# Patient Record
Sex: Male | Born: 1960 | Hispanic: Yes | Marital: Married
Health system: Northeastern US, Academic
[De-identification: ages and names within clinical notes are randomized; demographics above are authoritative.]

---

## 2019-04-16 ENCOUNTER — Encounter: Admit: 2019-04-16 | Payer: PRIVATE HEALTH INSURANCE | Attending: Nephrology | Primary: Family Medicine

## 2019-04-16 DIAGNOSIS — N186 End stage renal disease: Secondary | ICD-10-CM

## 2019-04-24 ENCOUNTER — Ambulatory Visit: Admit: 2019-04-24 | Payer: PRIVATE HEALTH INSURANCE | Attending: Cardiovascular Disease | Primary: Family Medicine

## 2019-04-24 ENCOUNTER — Encounter: Admit: 2019-04-24 | Payer: PRIVATE HEALTH INSURANCE | Attending: Cardiovascular Disease | Primary: Family Medicine

## 2019-04-24 DIAGNOSIS — E213 Hyperparathyroidism, unspecified: Secondary | ICD-10-CM

## 2019-04-24 DIAGNOSIS — D649 Anemia, unspecified: Secondary | ICD-10-CM

## 2019-04-24 DIAGNOSIS — J101 Influenza due to other identified influenza virus with other respiratory manifestations: Secondary | ICD-10-CM

## 2019-04-24 DIAGNOSIS — I10 Essential (primary) hypertension: Secondary | ICD-10-CM

## 2019-04-24 DIAGNOSIS — I712 Thoracic aortic aneurysm (HC Code): Secondary | ICD-10-CM

## 2019-04-24 DIAGNOSIS — N2581 Secondary hyperparathyroidism of renal origin: Secondary | ICD-10-CM

## 2019-04-24 DIAGNOSIS — K7460 Unspecified cirrhosis of liver: Secondary | ICD-10-CM

## 2019-04-24 DIAGNOSIS — G4733 Obstructive sleep apnea (adult) (pediatric): Secondary | ICD-10-CM

## 2019-04-24 DIAGNOSIS — E7800 Pure hypercholesterolemia, unspecified: Secondary | ICD-10-CM

## 2019-04-24 DIAGNOSIS — K219 Gastro-esophageal reflux disease without esophagitis: Secondary | ICD-10-CM

## 2019-04-24 DIAGNOSIS — N186 End stage renal disease: Secondary | ICD-10-CM

## 2019-04-24 DIAGNOSIS — T827XXA Infection and inflammatory reaction due to other cardiac and vascular devices, implants and grafts, initial encounter: Secondary | ICD-10-CM

## 2019-04-24 DIAGNOSIS — I770 Arteriovenous fistula, acquired: Secondary | ICD-10-CM

## 2019-04-24 NOTE — Progress Notes
Patient ID: [id number] Patient Name: Adam Harper of Birth: 10/15/1962Date of Service: 2/9/2021HISTORY OF PRESENT ILLNESS:  Patient is a 59 y.o. 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 A perfusion imaging stress test was obtained on January 02, 2015. This revealed a small mild basal to mid inferolateral fixed defect without evidence of ischemia. There was moderate LAD, circumflex and right coronary artery calcification. Aortic dimensions measured 4.1 cm at the proximal aortic root.Echocardiogram obtained on August 24, 2016 revealed an ejection fraction greater than 70% without evidence of significant LVOT obstruction, mild mitral and tricuspid insufficiency, aortic sclerosis, maximal aortic dimensions of 4.1 centimeters,, mild left atrial enlargement, estimated RV systolic pressure of 25 millimeters of mercury.  Moderate concentric LVH.[place] Chest completed on 05/25/2018 showed unchanged dilated ascending aorta to 4.1 cm. Severe three-vessel coronary arterial calcification. Indeterminant left renal nodular densities may represent hemorrhagic/proteinaceous cyst given. Recommend correlation with today's ultrasound. Further characterization can be performed with [place] or MRI renal mass protocol if warranted. 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 Dedicated thymic MRI can be performed for further characterization as clinically indicated.US Abdominal Aorta completed on 05/25/2018 showed the proximal abdominal aorta measures 2.6 x 2.4 cm in size. The mid abdominal aorta measures 2.2 x 1.9 cm in size. No evidence for abdominal aortic aneurysm.Echocardiogram obtained November 10, 2018. Mildly increased left ventricular cavity size. Normal left ventricular systolic function. Severe concentric left ventricular hypertrophy. LVEF calculated by 3DE was 68%. Normal right ventricular cavity size and systolic function. Right ventricular systolic pressure is unable to be estimated due to insufficient Doppler signal. Left atrium is moderately dilated. Mild aortic valve thickening. No aortic stenosis. Trace aortic regurgitation. Dilated sinuses of Valsalva with a diameter of 4.4 cm and dilated ascending aorta with a diameter of 4.2 cm. No evidence of pericardial effusion. Compared to the study from 08/24/16, the left ventricular hypertrophy is now severe.Adam Harper presents today in cardiac followup prior to listing for renal transplantation. A spanish translator was used throughout the visit. He has been feeling well since our last visit. He denies any new problems and denies pain in his arms, chest, and jaw. Reports that he fell on his left hip yesterday while shoveling snow, but he is not currently experiencing pain. Patient reports a new fistula, but no issues with dialysis. Patient uses CPAP for his sleep apnea.  He denies lightheadedness, dizziness, near-syncope or syncope.  He denies shortness of breath, orthopnea, PND, lower extremity edema.  No melena or bleeding.PAST MEDICAL HISTORY:Past Medical History: Diagnosis Date ? A-V fistula (HC Code)   right arm ? Anemia  ? AV fistula infection (HC Code)  ? Cirrhosis (HC Code)  ? 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 GERD (gastroesophageal reflux disease)  ? Hypercholesteremia  ? Hyperparathyroidism (HC Code)  ? Hypertension  ? Influenza A  ? Morbid obesity (HC Code) 02/03/2018 ? Obstructive sleep apnea   pt uses CPAP ? Secondary hyperparathyroidism of renal origin (HC Code)  ? Thoracic aortic aneurysm (HC Code)  CURRENT MEDICATION LIST:Current Outpatient Medications Medication Sig Dispense Refill ? amLODIPine (NORVASC) 10 mg tablet Take 1 tablet (10 mg total) by mouth every morning. 90 tablet 3 ? atorvastatin (LIPITOR) 80 mg tablet Take 1 tablet (80 mg total) by mouth daily. 90 tablet 3 ? carvediloL (COREG) 25 mg Immediate Release tablet TAKE 1 TABLET(25 MG) BY MOUTH TWICE DAILY WITH  BREAKFAST AND DINNER 90 tablet 2 ? cinacalcet (SENSIPAR) 30 MG tablet Take 2 tablets (60 mg total) by mouth daily (Patient taking differently: Take 90 mg by mouth daily. ) 30 tablet 11 ? DARBepoetin alfa-polysorbate (ARANESP) 60 mcg/0.3 mL syringe Inject 0.3 mLs (60 mcg total) under the skin every 7 days. In dialysis 4 Syringe 2 ? diclofenac (VOLTAREN) 1 % gel Apply topically 4 (four) times daily. 100 g 2 ? epoetin alfa (EPOGEN INJ) Inject 3,200 Units under the skin Every Monday, Wednesday, and Friday.   ? iron sucrose complex (VENOFER IV) Inject 50 mg into the vein Every Monday.   ? lisinopriL (PRINIVIL,ZESTRIL) 40 mg tablet Take 1 tablet (40 mg total) by mouth daily. 90 tablet 3 ? LORazepam (ATIVAN) 1 mg tablet Take 1 tablet (1 mg total) by mouth once as needed for anxiety (take 30 minutes prior to MRI for anxiety) for up to 1 dose. 1 tablet 0 ? omeprazole (PRILOSEC) 20 mg capsule Take 1 capsule (20 mg total) by mouth daily. 90 capsule 1 ? oxyCODONE (ROXICODONE) 5 mg Immediate Release tablet Take 1 tablet (5 mg total) by mouth every 4 (four) hours as needed. 8 tablet 0 ? paricalcitol (ZEMPLAR IV) Inject 7 mcg into the vein Every Monday, Wednesday, and Friday.   ? sevelamer (RENVELA) 800 mg tablet TAKE 3 TABLETS BY MOUTH THREE TIMES DAILY AND 2 WITH SNACKS ONCE DAILY 990 tablet 0 ? triamcinolone (KENALOG) 0.1 % cream Apply topically 2 (two) times daily. 30 g 1 No current facility-administered medications for this visit.  CURRENT ALLERGY LIST:No Known Allergies FAMILY HISTORY:No family history on file. SOCIAL HISTORY:Social History Socioeconomic History ? Marital status: Married   Spouse name: Not on file ? Number of children: Not on file ? Years of education: Not on file ? Highest education level: Not on file Tobacco Use ? Smoking status: Former Smoker   Packs/day: 0.25   Years: 25.00   Pack years: 6.25   Types: Cigarettes ? Smokeless tobacco: Never Used Substance and Sexual Activity ? Alcohol use: No ? Drug use: No ? Sexual activity: Yes   Partners: Female REVIEW OF SYSTEMS:Remaining 12-point review of systems unremarkable with the exception of history of present illness. This was repeated by me today, April 24, 2019.  VITALS:BP (P) 138/80 (Site: r a, Position: Sitting, Cuff Size: Large)  - Pulse (P) 73  - Ht 5' 9 (1.753 m)  - Wt (P) 117 kg  - SpO2 (P) 96%  - BMI (P) 38.10 kg/m? By M.D. Blood pressure is 134/64 in the left arm. PHYSICAL EXAM: 	CONSTITUTIONAL:		GENERAL APPEARANCE:  Patient is male.  Alert, well developed, well nourished.  In no acute distress.	HEAD/FACE: The head is normocephalic.	NECK AND THYROID: No bruits noted in the neck.  Difficult to assess JVD in the context of very thick neck.  No thyroid enlargement.	RESPIRATORY:  No wheezes, rales, or rhonchi. Clear to auscultation.	CARDIOVASCULAR: 		PALPATION & AUSCULTATION: S1 S2.  Regular rate with An audible S4 as well as a holosystolic murmur at the cardiac apex.		ARTERIAL PULSES: Normal carotid pulses with no bruits.  No palpable enlargement or bruit of the abdominal aorta.  No renal bruit.  Normal femoral pulses without bruits or enlargements.  Normal radial pulses.  Normal pedal pulses and capillary refill.		EDEMA/VARISCOSITIES OF EXTREMITIES: No significant edema.	GASTROINTESTINAL: 		ABDOMEN: Soft, non-tender, non distend. Normal bowel signs. No masses appreciated.		LIVER/KIDNEY/SPLEEN: No hepatosplenomegaly.	PSYCHIATRIC: Awake and alert.  Oriented to person, place, time and general circumstances.  Mood and affect appropriate.TEST RESULTS:  Chemistry    Component  Value Date/Time  NA 136 02/21/2019 0940  K 5.5 (H) 02/21/2019 0940  CL 94 (L) 02/21/2019 0940  CO2 23 02/21/2019 0940 BUN 63 (H) 02/21/2019 0940  CREATININE 17.05 (H) 02/21/2019 0940  GLU 123 (H) 02/21/2019 0940    Component Value Date/Time  CALCIUM 8.4 (L) 02/21/2019 0940  ALKPHOS 72 10/03/2018 1502  AST 20 10/03/2018 1502  AST 15 01/23/2013 1534  ALT 11 10/03/2018 1502  ALT 16 01/23/2013 1534  BILITOT 0.4 10/03/2018 1502  Lab Results Component Value Date  WBC 6.0 02/21/2019  WBC 4.7 10/03/2018  WBC 8.7 09/29/2018  HGB 10.6 (L) 02/21/2019  HGB 10.8 (L) 10/03/2018  HGB 12.5 09/29/2018  HCT 32.9 (L) 02/21/2019  HCT 39.0 (L) 02/15/2019  HCT 33.1 (L) 10/03/2018  MCV 105.4 (H) 02/21/2019  MCV 104.1 (H) 10/03/2018  MCV 103.5 (H) 09/29/2018  PLT 195 02/21/2019  PLT 235 10/03/2018  PLT 282 09/29/2018  HGBA1C 5.5 10/03/2018  HGBA1C 5.5 03/03/2018  HGBA1C 5.5 10/13/2015  CHOL 104 10/03/2018  CHOL 145 09/29/2018  CHOL 102 03/21/2018  CHOLHDL 4.3 10/03/2018  CHOLHDL 4.4 09/29/2018  CHOLHDL 3.6 03/21/2018  HDL 24 (L) 10/03/2018  HDL 33 (L) 09/29/2018  HDL 28 (L) 03/21/2018  LDL 43 10/03/2018  LDL 76 16/12/9602  LDL 45 54/11/8117  TRIG 185 (H) 10/03/2018  TRIG 182 (H) 09/29/2018  TRIG 145 03/21/2018  ALT 11 10/03/2018  ALT 19 09/29/2018  ALT 24 03/03/2018  AST 20 10/03/2018  AST 25 09/29/2018  AST 20 03/03/2018 Lab Results Component Value Date  CREATININE 17.05 (H) 02/21/2019 Lab Results Component Value Date  ALT 11 10/03/2018  AST 20 10/03/2018  ALKPHOS 72 10/03/2018  BILITOT 0.4 10/03/2018 Perfusion imaging stress test from January 02, 2015 as described above.Echocardiogram obtained on August 24, 2016 described above.Left heart catheterization on April 04, 2018 mild coronary disease in the LAD, left main artery, and LCX. No significant obstructive coronary disease noted. LVEDP 40 mmHg.[place] Chest completed on 05/25/2018 described above.US Abdominal Aorta completed on 05/25/2018 described above.Lipid profile obtained on 7/21/20202 revealed LDL of 43, HDL 24, triglycerides 185 and cholesterol 104. EKG obtained November 07, 2018 revealed sinus rhythm at a rate of 76, incomplete left bundle branch block, likely left ventricular hypertrophy with strain pattern.Echocardiogram obtained November 10, 2018 as described above.Blood work obtained on February 21, 2019 revealed sodium 136, potassium 5.5, creatinine 17.05, BUN 63, glucose 123, hematocrit 32.9, platelets 195. EKG obtained in the office today reveals sinus rhythm at a rate of 73 and incomplete left bundle branch block.  Likely LVH/strain pattern with T-wave inversions in V4 through V6.  T-wave inversions in 1 and L. ASSESSMENT/PLAN:Pretransplant evaluation: Cardiac risk factors include severe hypertension, dyslipidemia, prior tobacco use, treated obstructive sleep apnea. His EKG is abnormal as outlined above. Perfusion imaging stress test with no evidence of ischemia in 2016. There is evidence of underlying coronary artery disease with moderate triple-vessel coronary calcification. Repeat coronary angiogram on April 04, 2018 revealed no significant obstructive coronary disease.  Left ventricular end-diastolic pressure was significantly elevated.  I suspect this is secondary to high blood pressure, sleep apnea, and kidney disease with ongoing hemodialysis. Current dry weight at dialysis has been stable per patient.  I believe his dry weight should be reduced if at all possible.  He has been compliant with his CPAP.   I have continued high intensity statin therapy as well as high intensity beta-blockade and lisinopril.  At this juncture I believe he can remain listed for transplant.Hypertension: His blood  pressure is acceptable today in office. He will continue to monitor BP. He denies any lightheadedness, dizziness, or headaches. His current antihypertensive regimen includes carvedilol 25 mg b.i.d., amlodipine 10 mg daily, lisinopril 40 mg daily.  I will defer further increases in his antihypertensive regimen to the nephrology team.  Mildly dilated aortic root: Dilated sinuses of Valsalva with a diameter of 4.1 cm and dilated ascending aorta with a diameter of 4.0 cm on echocardiogram from June of 2018. Most recent [place] Chest completed on 05/25/2018 showed unchanged dilated ascending aorta to 4.1 cm. Severe three-vessel coronary arterial calcification. Indeterminant left renal nodular densities may represent hemorrhagic/proteinaceous cyst given. Recommend correlation with today's ultrasound. Further characterization can be performed with [place] or MRI renal mass protocol if warranted. 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 Dedicated thymic MRI can be performed for further characterization as clinically indicated. US Abdominal Aorta completed on 05/25/2018 showed the proximal abdominal aorta measures 2.6 x 2.4 cm in size. The mid abdominal aorta measures 2.2 x 1.9 cm in size. No evidence for abdominal aortic aneurysm. I have recommended ongoing blood pressure control.  I will discuss further increases in his antihypertensive regimen with his nephrology team.  I will defer further follow-up of his mediastinal nodule to his primary care team.  Most recent echocardiography from November 10, 2018 revealed maximal aortic dimensions of 4.4 centimeters at the sinuses of Valsalva, 4.2 centimeters at the mid ascending aorta.  This represents an increase from his previous study in 2018. At that time the sinuses of Valsalva measured 4.1 centimeters in the ascending aorta measured 4 centimeters.  I will obtain a follow-up echocardiogram before his next follow-up in 6 months.  I would continue to advocate aggressive blood pressure control.Dyslipidemia: Most recent lipid profile is at goal. He is tolerating Lipitor 80 mg daily.I will plan to see him in follow up in 6 months. He will contact me with any changes or questions. Scribed for Dr. Dell Ponto by Phillip Heal, medical scribe. February 9, 2021Electronically Signed by Phillip Heal, February 9, 2021The documentation recorded by the scribe accurately reflects the services I personally performed and the decisions made by me. I reviewed and confirmed all material pre-charted by the scribe at the time of the encounter.Scribed for Dr. Augusto Garbe by Kris Hartmann, medical scribe. February 9, 2021Electronically Signed by Kris Hartmann, February 9, 2021The documentation recorded by the scribe accurately reflects the service I personally performed and the decisions made by me.

## 2019-04-25 ENCOUNTER — Encounter: Admit: 2019-04-25 | Payer: PRIVATE HEALTH INSURANCE | Attending: Nephrology | Primary: Family Medicine

## 2019-04-25 DIAGNOSIS — N186 End stage renal disease: Secondary | ICD-10-CM

## 2019-04-30 ENCOUNTER — Encounter: Admit: 2019-04-30 | Payer: PRIVATE HEALTH INSURANCE | Primary: Family Medicine

## 2019-05-03 ENCOUNTER — Inpatient Hospital Stay: Admit: 2019-05-03 | Discharge: 2019-05-03 | Payer: PRIVATE HEALTH INSURANCE | Primary: Family Medicine

## 2019-05-03 NOTE — Discharge Instructions
[facility] REMOVAL DISCHARGE INSTRUCTIONSSite Care:1.  Remove dressing after 72 hours (3 days).2.  Keep wound clean and dry.3.  May shower after 72 hours (3 days).4.  No swimming, hot tubs or tub bath for 10-14 days.Activity:For the next 24 hours do not perform strenuous activity.Diet:Resume diet prior to procedure as tolerated.Medications:Resume all previous medications without change.When to Call Unusual pain, swelling or redness at siteBleedingShaking chillsChest painShortness of breathFever over 101*FDrainage around the site   Contact us:For any questions or concerns any time please call [phone].SHOULD THERE BE AN EMERGENCY-CALL 911 FOR IMMEDIATE PARAMEDIC RESPONSE OR GO DIRECTLY TO THE NEAREST EMERGENCY ROOM

## 2019-05-07 ENCOUNTER — Telehealth: Admit: 2019-05-07 | Payer: PRIVATE HEALTH INSURANCE | Attending: Dietician | Primary: Family Medicine

## 2019-05-08 ENCOUNTER — Ambulatory Visit: Admit: 2019-05-08 | Payer: PRIVATE HEALTH INSURANCE | Attending: Cardiovascular Disease | Primary: Family Medicine

## 2019-05-08 NOTE — Progress Notes
Nutrition Assessment TELEPHONE VISIT: For this visit the clinician and patient were present via telephone (audio only).Patient counseled on available options for visit type; Patient elected telephone visit; Patient consent given for telephone visit: YesPatient Identity was confirmed during this call.  Other individuals actively participating in the telephone encounter and their name/relation to the patient: noneTotal time spent in medical telephone consultation: 20 minutesBecause this visit was completed over telephone, a hands-on physical exam was not performed.  Patient understands and knows to call back if condition changes. The visit type for this patient required modifications due to the COVID-19 outbreak.Reason for consult: ongoing transplant evaluationReferred by: medical review boardAnthropometricsHt Readings from Last 3 Encounters: 04/24/19 5' 9 (1.753 m) 04/12/19 5' 9 (1.753 m) 04/03/19 5' 9 (1.753 m) Wt Readings from Last 10 Encounters: 04/24/19 (P) 117 kg 04/12/19 117.5 kg 04/03/19 117.5 kg 03/05/19 117.9 kg 02/28/19 113 kg 02/21/19 108.1 kg 02/15/19 111.1 kg 01/11/19 115.8 kg 12/18/18 111.1 kg 11/13/18 115.2 kg Dry Weight Per Dialysis Unit: 114 kgBMI: 37.1 (based off dry weight)Dosing Wt: 80kg  (143%)Weight History: Mr. Thompson weight as fluctuated between 112-118 kgover the past 9 years. Nutrition Focused Physical Assessment: Unable to completed due to distance nature of visit. Estimated Needs:  Kcal: 	30/kg - 250= 2150/dayProtein: 103gm/kg = 104gm/dayFluids/day: Fluid management per medical team's discretion Based on: dosing weight, age, dialysis, weight loss goalsNutrition Assessment:Dietary/Cultural/Religious/Ethnic Needs: Yes Patient requires Spanish language interpreterPhysical Activity/Limitations: No limitations reported- he reports taking walks daily to and from dialysis (~20 minutes each way). Pt's chart reviewed for nutritionally significant: labs, medical tests, clinical findings, food allergies, and medications (and food/nutrient interactions). Of note HgbA1C 5.5 (10/03/18), phosphorous (~9.0) and PTH (~1400) elevated per dialysis report relative to this nutrition assessment. Met with patient to discuss nutritional status.  Mr. Decesare reports a good appetite and denies any nutritional concerns.  He follows a restricted diet for dialysis and reports trying his best to adhere.  He would like to lose weight but does not know the best way and admits that he eats a lot.  He is willing to make changes in order to optimize transplant process.  Suspect he overall calorie intake is in excess of weight loss goal as noted above.  He appears to be ready to make a plan for change.  Conferred with dialysis RD.  She notes that he is receptive to interventions yet has struggled with consistent phosphorous binder use, often forgetting to take them at meal times.  Mr. Koppel confirms, but notes that he always takes all his other medications as prescribed.  His dialysis RD is willing to work with him towards weight loss.  Comprehension: GoodBarriers: None at this timeExpected Compliance: GoodSupport with Meals/Shopping needs: No concerns identified.  Nutrition Diagnosis:Obesity related to decreased metabolic needs, excessive caloric intae as evidenced by BMI >35. Interventions/Recommendations/Plan:   ?	Meals and Snacks: o	Composition of meals/snacks: Balanced meal plan based off My Plate guidelines as allowed within renal diet guidelines. o	Specific Foods/beverages: Lower phosphorous choiceso	Schedule of foods/fluids: Regular meal pattern of 3 meals and 1-2 snacks per day. ?	Medical Food Supplements: None at this time. ?	Vitamin and Mineral Supplements: Per dialysis?	Nutrition-Related Medication Management:Per dialysis?	Coordination of nutrition care: Discussed with dialysis RD and alerted transplant team to updates obtained. ?	Nutrition Education:o	Purpose of nutrition education: To highlight the role of nutrition in the transplant processo	Nutrition relationship to health/disease: Discussed the benefits of weight optimization in the periop and postop phases of transplant. o	Recommended Modifications: Patient will confer with his dialysis RD for specific recommendations for  weight loss that fit within his dialysis diet plan. ?	Insurance underwriter Provided: None at this visit. Nutrition Goals?	Patient will lose 2 percent of weight by the next nutrition visit?	Patient will be more consistent with phosphorous binder dosing. ?	Patient will confer with dialysis RD to make a plan to work towards weight loss?	Patient will be able to recall nutritional goals for transplant by the next nutrition visit Monitoring/Evaluating: Will continue to follow and monitor as consulted.  At time of next assessment will subjectively monitor: ?	Food and Nutrient Intake?	Anthropometric Measurements?	Biochemical Data, Medical Tests, and ProceduresProvided pt and family with contact information and encouraged to call with questions/concernsWill discuss visit outcomes with referring provider as needed.Available by consult PRN  Time spent with patient: 20 minutesElectronically Signed by Memory Dance, RD, May 08, 2019

## 2019-05-10 ENCOUNTER — Encounter: Admit: 2019-05-10 | Payer: PRIVATE HEALTH INSURANCE | Primary: Family Medicine

## 2019-05-10 NOTE — Progress Notes
SOCIAL WORK NOTEPatient Name: Adam Harper Record Number: [id number] Date of Birth: 09-Mar-1962Social Work Follow Up    Most Recent Value Document Type  Progress Note Reason for Encounter  Care Decision Concerns/Lack of Decision-Maker Intervention  Decision-Making/Care Competence Assessment & Resolution Decision-Making/Care Competence Assessment & Resolution  Adherence to medical regimen/readmission Source of Information  Medical Team Level of Care  Ambulatory Social Work Cluster  Transplant Identified Clinical/Disposition, Issues/Barriers:  [name] is Status 7 on the kidney transplant waiting list. I have reviewed psychosocial assessment completed by Adam Harper in July 2020 Intervention(s)/Summary  I reviewed updated collateral information from Dialysis Unit which indicates [name] remains adherent with attending his treatments. He continues to struggle with diet and consistently taking binders; phosphorus is currently 9.  He is noted to be cooperative with medical team Outcome  Resolved Handoff Required?  No Next Steps/Plan (including hand-off):  -Previously completed [employer] Integrated Psychosocial Assessment for Transplant (SIPAT) score of 14, reflecting as good psychosocial candidate -Medical team to review phosphorus and PTH levels - No further social work intervention indicated. Please contact this writer should any transplant related psychosocial needs arise. Signature:  Adam Harper, lcswKidney Transplant Service  Contact Information:  [phone]

## 2019-05-11 ENCOUNTER — Encounter: Admit: 2019-05-11 | Payer: PRIVATE HEALTH INSURANCE | Attending: Nephrology | Primary: Family Medicine

## 2019-05-11 ENCOUNTER — Encounter: Admit: 2019-05-11 | Payer: PRIVATE HEALTH INSURANCE | Primary: Family Medicine

## 2019-05-11 DIAGNOSIS — N186 End stage renal disease: Secondary | ICD-10-CM

## 2019-05-14 ENCOUNTER — Inpatient Hospital Stay: Admit: 2019-05-14 | Discharge: 2019-05-14 | Payer: PRIVATE HEALTH INSURANCE | Primary: Family Medicine

## 2019-05-21 ENCOUNTER — Ambulatory Visit: Admit: 2019-05-21 | Payer: PRIVATE HEALTH INSURANCE | Attending: Dietician | Primary: Family Medicine

## 2019-05-21 NOTE — Progress Notes
Reviewed chart, spoke with dialysis unit RD per the request of transplant team.  Adam Harper has struggled with PTH and phosphorous values in the recent past.  He doesn't tolerate his binders well and adherence to the diet has been variable.  Per discussion today, his PTH has down trended from 1486 (04/18/19) to 901 (05/16/19) and his phosphorus is at an all time low of 6.  His dialysis RD notes he has been more adherent and his Senispar dose was increased.  She notes he is excited for transplant and demonstrates good motivation.  Alerted transplant wait list coordinator to this positive progress.  Will be available to follow up as needed.

## 2019-05-21 NOTE — Progress Notes
Adam Harper is a 59 y.o. 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 The patient has been maintained on dialysis since 07-29-10, and on the UNOS waitlist at [facility] since 09/03/2010. The patient has a total of 8.75 years of accumulated wait time in the O blood group. Most recent cPRA: 0% EPTS score: 50%.  His case was reviewed and discussed with the team on 05-14-19 and cleared for active status pending updated PTH as last level was 1486 in January.  Per his dialysis unit, this month it was 901 drawn on 05-16-19.For cardiology he is followed by Dr. Dell Ponto, last seen 04-23-09.  Was cleared for transplant at this time.Past medical history of mild nonobstructive CAD,?HTN,?HLD,?OSA, thoracic aortic aneurysm, presenting for prekidney transplant listing cardiovascular evaluation.Per Adam Pica, RN note: In 12/2014, he had a pharm SPECT which showed a small, mild area of scar in the basal to mid inferolateral wall. Adam Harper performed for attenuation correction showed 3v coronary calcifications and a dilated mid ascending aorta measuring?4.1 cm. In 03/2018, he underwent cardiac catheterization for prekidney transplant listing eval. It showed mild diffuse disease with an anomalous anterosuperior takeoff of the RCA and an elevated LVEDP of 40 mmHg. A subsequent Adam Harper in 05/2018 showed stable aortic dilation. ?Adam Harper (10/2018)* Mildly increased left ventricular cavity size. ?Normal left ventricular systolic function. ?Severe concentric left ventricular hypertrophy. ?LVEF calculated by 3DE was 68%.* Normal right ventricular cavity size and systolic function. ?Right ventricular systolic pressure is unable to be estimated due to insufficient Doppler signal.* Left atrium is moderately dilated.* Mild aortic valve thickening. ?No aortic stenosis. ?Trace aortic regurgitation.* Dilated sinuses of Valsalva with a diameter of 4.4 cm and dilated ascending aorta with a diameter of 4.2 cm.* No evidence of pericardial effusion.* Compared to the study from 08/24/16, the left ventricular hypertrophy is now severe.?Adam Harper (03/2018)Left main: mild diseaseLAD: mild diseaseLCx: mild diseaseRCA: Dominant vessel. ?Anomalous takeoff anterior superior (AL2 catheter) LV: not injectedLVEDP 40 mmHg?Abdominal ultrasound (05/2018):No evidence for abdominal aortic aneurysm.?Adam Harper scan (05/2018):1. ?Unchanged dilated ascending aorta to 4.1 cm.2. ?Severe three-vessel coronary arterial calcification.?Cleared in high risk cardiac clinic by Dr. Gabriela Harper in June 2020. 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  He has had dysarthia for many decades per medical records.  ?Repetitive nerve stimulation was normal per report. ?Patient was referred to thoracic surgery,?Dr.?Harper,?who did not think that patient's mediastinal abnormality was a thymoma but rather likely a benign cyst,?and stable over the last 4 years comparing repeat imaging.?Myasthenia Gravis [facility] Summary:Adam Harper is a 59 y.o. 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 ?Repeat antibody level was decreased and just slightly above the reference range cut off. ?Suspect that this might be a false-positive and could be due to patient's end-stage renal disease. ?My suspicion for autoimmune myasthenia gravis is low at this time. I recommend that we follow  patient's clinical course over time and will schedule a follow-up visit with me in approximately 6 months. My overall impression and recommendations were reviewed with patient today.?I did review with patient typical signs and symptoms of myasthenia gravis,?in ask that patient contact us should any of these symptoms emerge or with any specific questions. ?All questions answered at time of our visit. ?Today's visit was conducted with interpreter services?- Short Neck to be discussed by team - was seen by anesthesia on 02/20/2019.  Has been intubated in past and recently had general anesthesia with a laryngeal mask airway without issue per documentation.  Prior anesthesia notes indicated large tongue and redundant tissue noted.  Glidescope, Blade Size 4 used.  - Patient completed a cardiac Adam Harper in January 2020.

## 2019-05-21 NOTE — Progress Notes
[facility] Transplantation CenterKidney Transplant ProgramWaitlist Status Change Patient Summary: REFERRING PROVIDER: Mahalia Longest [facility] CARE PROVIDER: Esaw Dace:  Adam Harper , [id number] AGE:  59 y.o. GENDER:  male ETIOLOGY:  FSGS TOTAL DIALYSIS DAYS:  3197          07-29-10             LISTED  09-03-10 KAS: 8.75 ABO:  O cPRA:  0 BMI:  Estimated body mass index is 38.1 kg/m? (pended) as calculated from the following:  Height as of 04/24/19: 5' 9 (1.753 m).  Weight as of 04/24/19: (P) 117 kg. REASON FOR STATUS CHANGE:  Case discussed with team, chart updated, cleared for active status. LIVING DONOR: No Transplant Plan of Care:  Discussed with team, will need review of PTH prior to active status.Will need updated red top due to dialysis time prior to active status change.  Last one on file was dated 7-21-20Called dialysis unit today 04/30/19 who will send a sample this Wednesday.A letter noting the status change has been sent to the patient and their primary care team including physician and dialysis unit.  Marland Kitchen03/08/21Discussed with team following MRB on 05-14-19  Dialysis unit to draw PTH on 05-16-19.  Informed today it is down from 1486 in Feb to 901 this month.  They will fax over findings.Patient informed via Spanish Interpreter # [phone] no answer, message left.  Spoke with dialysis unit,patient getting off machine shortly.

## 2019-05-22 ENCOUNTER — Encounter: Admit: 2019-05-22 | Payer: PRIVATE HEALTH INSURANCE | Primary: Family Medicine

## 2019-05-22 NOTE — Progress Notes
Pt  is actively listed for a kidney transplant and per Maxine Glenn is starting to see offers, pt has [facility] as of 04/16/19 and an authorization is required. Sent email to team as an urgent request to Please send clinical along with the attached form to [facility] as soon as possible.

## 2019-05-22 NOTE — Progress Notes
Notified by finance team patient is not financially cleared.  Due to his KAS and probability of receiving an offer at any time, will make inactive until authorization can be obtained.  Once obtained will be made active again.  Per finance, likely by Friday of this week.

## 2019-05-24 ENCOUNTER — Ambulatory Visit: Admit: 2019-05-24 | Payer: PRIVATE HEALTH INSURANCE | Primary: Family Medicine

## 2019-05-25 ENCOUNTER — Telehealth: Admit: 2019-05-25 | Payer: PRIVATE HEALTH INSURANCE | Primary: Family Medicine

## 2019-05-25 LAB — HLA PRE TRANSPLANT ANTIBODY EVALUATION (YMG)
HLA CLASS I ANTIBODY TEST RESULT (S.A.): NEGATIVE
HLA CLASS II ANTIBODY TEST RESULT (S.A.): NEGATIVE
HLA CLASS II CALCULATED PERCENT REACTIVE ANTIBODY (CPRA): 0

## 2019-05-25 NOTE — Telephone Encounter
Returned call to patient, no answer.  Left message on voice mail for call back.

## 2019-06-14 ENCOUNTER — Encounter: Admit: 2019-06-14 | Payer: PRIVATE HEALTH INSURANCE | Primary: Family Medicine

## 2019-06-14 NOTE — Progress Notes
TC to [id number], S/W Sam, I asked the status of the authorization for the kidney transplant listing. Advised Sam that the benefits were received via Fax but it did not state the approval number.  Per Sam he is showing pt is approved under [place]. #[id number], valid 05/22/19-07/20/19, when I asked why Berkley Harvey was only valid for 2 months he said he would transfer me to the dept that could answer this and when transferred the call disconnected.Call ref. #[id number]

## 2019-06-15 ENCOUNTER — Encounter: Admit: 2019-06-15 | Payer: PRIVATE HEALTH INSURANCE | Primary: Family Medicine

## 2019-06-15 ENCOUNTER — Encounter
Admit: 2019-06-15 | Payer: PRIVATE HEALTH INSURANCE | Attending: Vascular and Interventional Radiology | Primary: Family Medicine

## 2019-06-15 NOTE — Progress Notes
I had called [facility] to verify the authorization # they gave me as it was only for 2 months and usually kidney transplant listing is for 1 year, per [facility] the authorization that is on file is for Dr. Dell Ponto not for kidney transplant, it seems that the request we sent was not approved as of yet. Sent email to [place] and kidney team asking to Please refax the form attached and all the clinical to [facility] as urgent . Pt is not financially cleared for kidney transplant. Pt is seeing offers as early as today.Attn: Transplant Case [phone], [phone] Fax-[phone]

## 2019-06-15 NOTE — Progress Notes
[facility] Transplantation CenterKidney Transplant ProgramWaitlist Status Change Patient Summary: REFERRING PROVIDER: Mahalia Longest [facility] CARE PROVIDER: Esaw Dace:  [name] , [id number] AGE:  59 y.o. GENDER:  male ETIOLOGY:  Focal Glomerular Sclerosis (Focal Segmental - FSG) TOTAL DIALYSIS DAYS:  3242      07-29-10           Listed 09-03-10 KAS:  ~9 ABO:  O cPRA: 0 BMI:  Estimated body mass index is 38.1 kg/m? (pended) as calculated from the following:  Height as of 04/24/19: 5' 9 (1.753 m).  Weight as of 04/24/19: (P) 117 kg. REASON FOR STATUS CHANGE:  Is now financially cleared LIVING DONOR: No

## 2019-06-15 NOTE — Progress Notes
Sent email to [employer] and Kidney team advising that Case manager Misty Stanley from [employer] called me back and stated she need pt's HIV results and Dr. Ammie Dalton NPI number in order to approve kidney transplant. I have added Dr. Ammie Dalton NPI # to the form and asked  if information could please fax it back to [facility] with pt's  HIV results so she can approve Kidney transplant.

## 2019-06-18 ENCOUNTER — Encounter: Admit: 2019-06-18 | Payer: PRIVATE HEALTH INSURANCE | Primary: Family Medicine

## 2019-06-18 ENCOUNTER — Encounter: Admit: 2019-06-18 | Payer: PRIVATE HEALTH INSURANCE | Attending: Nephrology | Primary: Family Medicine

## 2019-06-18 DIAGNOSIS — N186 End stage renal disease: Secondary | ICD-10-CM

## 2019-06-18 NOTE — Progress Notes
Received fax for approval for Kidney waitlist  Auth# [id number] effective 06/15/19-09/14/19 Attached provider tool to media manager and copy of auth.   When patient is admitted call [phone] ext [phone]

## 2019-06-21 ENCOUNTER — Encounter
Admit: 2019-06-21 | Payer: PRIVATE HEALTH INSURANCE | Attending: Vascular and Interventional Radiology | Primary: Family Medicine

## 2019-06-25 ENCOUNTER — Inpatient Hospital Stay: Admit: 2019-06-25 | Discharge: 2019-06-25 | Payer: PRIVATE HEALTH INSURANCE | Primary: Family Medicine

## 2019-06-25 ENCOUNTER — Telehealth: Admit: 2019-06-25 | Payer: PRIVATE HEALTH INSURANCE | Attending: Cardiovascular Disease | Primary: Family Medicine

## 2019-06-25 NOTE — Telephone Encounter
Patient is having neck surgery on 5/4 @ [facility] Called and LVM with Nita Sells- Dr Rober Minion nurse for more information [phone]

## 2019-06-26 NOTE — Telephone Encounter
Clearance apt scheduled with Shanda Bumps next weekNeeds updated EKG for Parathyroidectomy on 5/4Scanned forms

## 2019-06-27 ENCOUNTER — Encounter
Admit: 2019-06-27 | Payer: PRIVATE HEALTH INSURANCE | Attending: Vascular and Interventional Radiology | Primary: Family Medicine

## 2019-06-29 ENCOUNTER — Encounter
Admit: 2019-06-29 | Payer: PRIVATE HEALTH INSURANCE | Attending: Vascular and Interventional Radiology | Primary: Family Medicine

## 2019-07-01 ENCOUNTER — Telehealth: Admit: 2019-07-01 | Payer: PRIVATE HEALTH INSURANCE | Primary: Family Medicine

## 2019-07-01 NOTE — Telephone Encounter
[facility] Transplantation CenterInitial Evaluation Date: 06/10/2010 Name:  Adam Harper , [id number]  Age:  59 y.o.  Gender:  male  Etiology:  Focal Glomerular Sclerosis (Focal Segmental - FSG)  ABO:  O  BMI:  Estimated body mass index is 38.1 kg/m? (pended) as calculated from the following:  Height as of 04/24/19: 5' 9 (1.753 m).  Weight as of 04/24/19: (P) 117 kg. The above patient called to discuss potential deceased organ offer Donor ID: [id number]. Patient states they are feeling well, no signs and symptoms of infection reported. I discussed organ donor's past medical history and the current known quality of organ with [name]. Discussed the possibility that the transplant can be cancelled at any time due to recipient or donor medical issues. Bron verbalized acceptance of potential organ transplant. [name], that the donor OR is set for tomorrow afternoon and we will update him with further instructions tomorrow. Patient verbalized understanding of instructions and intent to comply with the plan of care. Electronically Signed by Tami Ribas, RN, July 01, 2019

## 2019-07-06 ENCOUNTER — Encounter: Admit: 2019-07-06 | Payer: PRIVATE HEALTH INSURANCE | Attending: Family | Primary: Family Medicine

## 2019-07-06 ENCOUNTER — Ambulatory Visit: Admit: 2019-07-06 | Payer: PRIVATE HEALTH INSURANCE | Attending: Family | Primary: Family Medicine

## 2019-07-06 DIAGNOSIS — E785 Hyperlipidemia, unspecified: Secondary | ICD-10-CM

## 2019-07-06 DIAGNOSIS — I10 Essential (primary) hypertension: Secondary | ICD-10-CM

## 2019-07-06 DIAGNOSIS — K7460 Unspecified cirrhosis of liver: Secondary | ICD-10-CM

## 2019-07-06 DIAGNOSIS — N2581 Secondary hyperparathyroidism of renal origin: Secondary | ICD-10-CM

## 2019-07-06 DIAGNOSIS — I770 Arteriovenous fistula, acquired: Secondary | ICD-10-CM

## 2019-07-06 DIAGNOSIS — I712 Thoracic aortic aneurysm (HC Code): Secondary | ICD-10-CM

## 2019-07-06 DIAGNOSIS — J101 Influenza due to other identified influenza virus with other respiratory manifestations: Secondary | ICD-10-CM

## 2019-07-06 DIAGNOSIS — G4733 Obstructive sleep apnea (adult) (pediatric): Secondary | ICD-10-CM

## 2019-07-06 DIAGNOSIS — Z01810 Encounter for preprocedural cardiovascular examination: Secondary | ICD-10-CM

## 2019-07-06 DIAGNOSIS — E7800 Pure hypercholesterolemia, unspecified: Secondary | ICD-10-CM

## 2019-07-06 DIAGNOSIS — T827XXA Infection and inflammatory reaction due to other cardiac and vascular devices, implants and grafts, initial encounter: Secondary | ICD-10-CM

## 2019-07-06 DIAGNOSIS — K219 Gastro-esophageal reflux disease without esophagitis: Secondary | ICD-10-CM

## 2019-07-06 DIAGNOSIS — N186 End stage renal disease: Secondary | ICD-10-CM

## 2019-07-06 DIAGNOSIS — E213 Hyperparathyroidism, unspecified: Secondary | ICD-10-CM

## 2019-07-06 DIAGNOSIS — R9431 Abnormal electrocardiogram [ECG] [EKG]: Secondary | ICD-10-CM

## 2019-07-06 DIAGNOSIS — D649 Anemia, unspecified: Secondary | ICD-10-CM

## 2019-07-06 NOTE — Patient Instructions
?	  Puede seguir tomando sus medicamentos sin aguantarlos para su operacion. No esta tomando medicamentos que aguan la sangre en [place].?	Llame la oficina si tiene sintomas o preguntas?	Cita con el doctor Possick Agosto 20, 2021?	BUENA SUERTE CON SU OPERACION!!!

## 2019-07-06 NOTE — Progress Notes
[facility]	 [facility]		Office VisitLuis A Harper is a 59 y.o.male followed by Dr. Dell Ponto seen today in the [facility] office for a preoperative cardiovascular risk assessment and evaluation in anticipation of a parathyroidectomy on 07/17/19 with Dr. 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 Dell Ponto (04/24/19) he appeared stable from a cardiovascular perspective and was said to have an abnormal EKG noting incomplete left bundle branch block with likely LVH/strain pattern and T-wave inversions in V4-V6, and T-wave inversions in 1 and aVL. His elevated LVEDP on catheterization was said to be possibly related to high blood pressure, sleep apnea, and kidney disease with ongoing hemodialysis. He was said to be stable given his recent cardiac workup including cardiac catheterization and no changes were made to his medical management.He has had no interval hospitalizations.Today his weight is 249.6 pounds, a 7.8 pound weight loss in approximately 10 weeks. He arrives to the visit alone and reports that he has been feeling well and without cardiac symptoms since his last office visit. He goes for 20 minute walks to hemodialysis from his home three days per week and does not experience chest pain, shortness of breath or other anginal or exertional symptoms. He does not experience chest discomfort or other cardiac symptoms at rest or with his other usual daily activities. He saw his PCP earlier this week for a preoperative visit. He denies a history of bleeding, clotting and other blood disorders. He denies a history of stroke and TIA. He is not currently taking blood thinning medications. He uses CPAP at night and reports restful sleep with the device. He denies chest pain, palpitations, jaw pain, arm pain, other anginal symptoms, shortness of breath, PND, orthopnea, syncope, presyncope, falls, bleeding, edema, and neurologic symptoms indicative of a stroke. He is compliant with all of his medications.Problem list:Patient Active Problem List  Diagnosis Date Noted ? AVF (arteriovenous fistula) (HC Code) 02/20/2019 ? Arteriovenous fistula stenosis, subsequent encounter 01/12/2019   Added automatically from request for surgery [id number] ? 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 Colon polyps 08/10/2018 ? Internal hemorrhoids 08/10/2018 ? Secondary hyperparathyroidism of renal origin (HC Code) 04/07/2018   59 year old Spanish speaking male on Hemodialysis - [facility] - M,W, F via AV FistulaUS  [place]  DOS 9:00Records requested from [facility]  pth 473, calcium 8.9 ? Morbid obesity (HC Code) 02/03/2018 ? Complication of arteriovenous dialysis fistula, subsequent encounter 12/05/2017   Added automatically from request for surgery [id number] ? End-stage renal disease (HC Code) 12/05/2017   Added automatically from request for surgery [id number] ? Hyperkalemia 03/18/2017 ? 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 Sepsis (HC Code) 10/24/2014   Updated by IMO Load 12/13/2017 ? Infected prosthetic vascular graft, initial encounter (HC Code) 10/24/2014 ? Anemia in ESRD (end-stage renal disease) (HC Code) 10/24/2014   Added automatically from request for surgery [id number] Added automatically from request for surgery [id number] Overview: Added automatically from request for surgery [id number] ? 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 Hyperparathyroidism (HC Code) 04/13/2010   59 y.o. male with secondary hyperparathyroidism, on hemodialysisUltrasound: parathyroid localization 07/11/2018 [place]  Right lobe 5.2 cm.  Left lobe 4.6 cm..  Small nodules bilaterally right lobe 9mm and 4mm., left lobe 8mm and 7mm (TIRADS 1 &  2 )                    No parathyroids identified.Calcium: 9.1  (05/16/19)Intact PTH: 901  (05/16/19)25-hydroxy-vitamin D: Creatinine: 14.37  (05/16/19) Medications:Current Outpatient Medications Medication Sig Dispense Refill ? amLODIPine (NORVASC) 10 mg tablet Take 1 tablet (10 mg total) by mouth every morning. 90 tablet 3 ? atorvastatin (LIPITOR) 80 mg tablet Take 1 tablet (80 mg total) by mouth daily. 90 tablet 3 ? carvediloL (COREG) 25 mg Immediate Release tablet TAKE 1 TABLET(25 MG) BY MOUTH TWICE DAILY WITH BREAKFAST AND DINNER 90 tablet 2 ? cinacalcet (SENSIPAR) 30 MG tablet Take 2 tablets (60 mg total) by mouth daily (Patient taking differently: Take 90 mg by mouth daily. ) 30 tablet 11 ? DARBepoetin alfa-polysorbate (ARANESP) 60 mcg/0.3 mL syringe Inject 0.3 mLs (60 mcg total) under the skin every 7 days. In dialysis 4 Syringe 2 ? diclofenac (VOLTAREN) 1 % gel Apply topically 4 (four) times daily. 100 g 2 ? epoetin alfa (EPOGEN INJ) Inject 3,200 Units under the skin Every Monday, Wednesday, and Friday.   ? iron sucrose complex (VENOFER IV) Inject 50 mg into the vein Every Monday.   ? lisinopriL (PRINIVIL,ZESTRIL) 40 mg tablet Take 1 tablet (40 mg total) by mouth daily. 90 tablet 3 ? LORazepam (ATIVAN) 1 mg tablet Take 1 tablet (1 mg total) by mouth once as needed for anxiety (take 30 minutes prior to MRI for anxiety) for up to 1 dose. 1 tablet 0 ? omeprazole (PRILOSEC) 20 mg capsule Take 1 capsule (20 mg total) by mouth daily. 90 capsule 1 ? oxyCODONE (ROXICODONE) 5 mg Immediate Release tablet Take 1 tablet (5 mg total) by mouth every 4 (four) hours as needed. 8 tablet 0 ? paricalcitol (ZEMPLAR IV) Inject 7 mcg into the vein Every Monday, Wednesday, and Friday.   ? sevelamer (RENVELA) 800 mg tablet TAKE 3 TABLETS BY MOUTH THREE TIMES DAILY AND 2 WITH SNACKS ONCE DAILY 990 tablet 0 ? triamcinolone (KENALOG) 0.1 % cream Apply topically 2 (two) times daily. 30 g 1 No current facility-administered medications for this visit.  Allergies:He has No Known Allergies.Past Medical History:Past Medical History: Diagnosis Date ? A-V fistula (HC Code)   right arm ? Anemia  ? AV fistula infection (HC Code)  ? Cirrhosis (HC Code)  ? 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 GERD (gastroesophageal reflux disease)  ? Hypercholesteremia  ? Hyperparathyroidism (HC Code)  ? Hypertension  ? Influenza A  ? Morbid obesity (HC Code) 02/03/2018 ? Obstructive sleep apnea   pt uses CPAP ? Secondary hyperparathyroidism of renal origin (HC Code)  ? Thoracic aortic aneurysm [facility] Code)  Past Surgical History:Past Surgical History: Procedure Laterality Date ? AV FISTULA PLACEMENT Left   left lower arm unsuccessful, then moved to upper arm 2011, then had problems in 10/2013 - infection Social History:Social History Socioeconomic History ? Marital status: Married   Spouse name: Not on file ? Number of children: Not on file ? Years of education: Not on file ? Highest education level: Not on file Occupational History ? Not on file Social Needs ? Financial resource strain: Not on file ? Food insecurity   Worry: Not on file   Inability: Not on file ? Transportation needs   Medical: Not on file   Non-medical: Not on file Tobacco Use ? Smoking status: Former Smoker   Packs/day: 0.25   Years: 25.00   Pack years: 6.25   Types: Cigarettes ? Smokeless  tobacco: Never Used Substance and Sexual Activity ? Alcohol use: No ? Drug use: No ? Sexual activity: Yes   Partners: Female Lifestyle ? Physical activity   Days per week: Not on file   Minutes per session: Not on file ? Stress: Not on file Relationships ? Social Psychologist, counselling on phone: Not on file   Gets together: Not on file   Attends religious service: Not on file   Active member of club or organization: Not on file   Attends meetings of clubs or organizations: Not on file   Relationship status: Not on file ? Intimate partner violence   Fear of current or ex partner: Not on file   Emotionally abused: Not on file   Physically abused: Not on file   Forced sexual activity: Not on file Other Topics Concern ? Not on file Social History Narrative ? Not on file Family History:No family history on file.ROS The following ROS documentation is the transcribed review of systems completed by the patient at intake:Review of Systems:Constitution: negativeHENT: negativeEyes: negativeCardiovascular: negativeRespiratory: negativeEndocrine: negativeHematologic/Lymphatic: negativeSkin: negativeMusculoskeletal: negativeGastrointestinal: negativeGenitourinary: negativeNeurological: negativePsychiatric/Behavioral: negativeAllergic/Immunologic: negativeThe remainder of the 12-point review of systems was reviewed with the patient and is negative.Vital Signs: BP 108/62 (Site: l a, Position: Sitting)  - Pulse 70  - Ht 5' 9 (1.753 m)  - Wt 113.2 kg  - SpO2 98%  - BMI 36.86 kg/m? Wt Readings from Last 3 Encounters: 07/06/19 113.2 kg 04/24/19 (P) 117 kg 04/12/19 117.5 kg Physical ExamGeneral: alert, speaking easily, NAD.HEENT: nonicteric, mmm.Neck: difficult to assess JVP, no carotid bruit.Heart: regular heart tones. S1,S2. Soft SEM noted over left upper sternal border. No rubs, clicks or gallops.Lungs: CTA bilaterally. Unlabored respirations. No rales, wheeze, or rhonchi.Abdomen: soft, nontender, +BS. No palpable enlargement or bruit of the abdominal aorta.Extremities: no pedal edema or cyanosis. +2 DP pulses bilaterally.Skin: warm and dry.Psych: friendly, appropriate to situation. A&O x 3.Labs: 02/21/2019 09:40 Sodium 136 Potassium 5.5 (H) Chloride 94 (L) CO2 23 Anion Gap 19 (H) BUN 63 (H) Creatinine 17.05 (H) BUN/Creatinine Ratio 3.7 (L) eGFR (NON African-American) 3 eGFR (Afr Amer) 4 Glucose 123 (H) Calcium 8.4 (L) Magnesium 2.0 Phosphorus 7.1 (H) WBC 6.0 RBC 3.1 (L) Hemoglobin 10.6 (L) Hematocrit 32.9 (L) MCV 105.4 (H) MCH 34.0 (H) MCHC 32.2 RDW-CV 13.0 nRBC 0.0 Platelets 195 MPV 10.1 Neutrophils 73.8 Lymphocytes 14.0 Monocytes 7.9 Eosinophils 3.5 Basophils 0.5 ANC (Abs Neutrophil Count) 4.4 Absolute Lymphocyte Count 0.8 (L) Monocytes (Absolute) 0.5 Eosinophil Absolute Count 0.2 Basophils Absolute 0.0 Immature Granulocytes (Abs) 0.0 nRBC Absolute 0.0 Immature Granulocytes 0.3 Lipid Panel:Lab Results Component Value Date  CHOL 104 10/03/2018  TRIG 185 (H) 10/03/2018  HDL 24 (L) 10/03/2018  LDL 43 10/03/2018 EKG: sinus rhythm, left bundle branch block, T-wave inversions in I/aVL/V4-V6, 72 bpm. No significant change from prior tracing obtained 04/24/19.ECHO:  Results for orders placed or performed during the hospital encounter of 11/10/18 Echo 2D Complete w Doppler and CFI if Ind Image Enhancement 3D and or bubbles Result Value Ref Range  Reported 3DE EF% 68 %  Narrative   * Mildly increased left ventricular cavity size.  Normal left ventricular systolic function.  Severe concentric left ventricular hypertrophy.  LVEF calculated by 3DE was 68%.* Normal right ventricular cavity size and systolic function.  Right ventricular systolic pressure is unable to be estimated due to insufficient Doppler signal.* Left atrium is moderately dilated.* Mild aortic valve thickening.  No aortic stenosis.  Trace aortic regurgitation.* Dilated sinuses of Valsalva with a diameter of  4.4 cm and dilated ascending aorta with a diameter of 4.2 cm.* No evidence of pericardial effusion.* Compared to the study from 08/24/16, the left ventricular hypertrophy is now severe. STRESS TEST: Results for orders placed or performed during the hospital encounter of 01/02/15 NM Spect [place] Read [facility])  Narrative  Limited Noncontrast [place] of the Chest for SPECT Attenuation Correction.  Date:  01/02/2015 11:52 AMHistory/Indication:  pre renal transplant, htnTechnique:Expiratory noncontrast [place] imaging of the chest was performed for SPECT attenuation correctionTechnical limitations: Limitations of low-dose technique and expiratory image acquisition Comparison: No similar studies are available for comparison at the time of interpretation.FINDINGSThe nuclear cardiac findings will be reported under a separate accession number by the cardiology service. Lungs/Airways/Pleura : The visualized lungs are clear within the limitations of low-dose technique and expiratory image acquisition. There is no pleural effusion. Mediastinum, Lymph nodes: A few borderline enlarged lymph nodes are seen in the mediastinum, particularly in the prevascular and right lower paratracheal stations, measuring up to 1 cm in short axis. There are also prominent bilateral axillary lymph nodes, partially imaged and measuring less than 1 cm in short axis.Marland Kitchen Heart and Vessels: The left ventricle appears prominent. There is moderate to severe three-vessel coronary artery calcification. No pericardial effusion. Mid ascending aortic size is 4.1 cm. There is a catheter in the superior vena cava that terminates in the right atrium. The main pulmonary artery is prominent measuring approximately 3.2 cm in transverse diameter.Abdomen: Within the limitations of the low dose technique the visualized upper abdominal organs are unremarkable.Osseous structures and Soft Tissues: No aggressive bone lesions. Degenerative changes are seen in the visualized spine. Degenerative changes are seen in the visualized spine. 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  There is no ischemia identified.  Truncation artifact is possible.  Left ventricular ejection fraction was normal with normal wall motion.  Stress electrocardiogram was normal.  Egan performed for attenuation correction showed moderate LAD, diagonal as well as mild LCX, RCA and aortic calcification.  Non-cardiac findings of the [place] are described in a separate report from Radiology.No significant change compared to prior study dated 09/12/2008.  IMPRESSION/PLAN:?	Cardiovascular Preoperative Risk Assessment: Mr. Rosenberry presents today for a preoperative cardiovascular risk assessment in anticipation of a parathyroidectomy on 07/17/19 with Dr. Gailen Shelter. He underwent cardiac catheterization in January 2020 that was stable/reassuring as outlined above. He reports no chest pain, shortness of breath, exertional or anginal symptoms, or other manifestations that would raise concern for cardiac etiology indicating a need for further testing. He appears well-compensated on physical examination with stable vital signs and ECG. At this juncture, Mr. Climer is stable from a cardiovascular perspective and no contraindications have been identified to the planned surgical procedure. He is not taking anticoagulant medications that need to be held prior to surgery. **Follow-up with Dr. Dell Ponto on 8/20/21I encouraged the patient to call me with any questions, concerns, or any worsening or new symptoms.Tad Moore, APRN4/23/20212:12 PMYale [facility] & V[facility] Center Office: [phone] Signed by Tad Moore, APRN, July 06, 2019

## 2019-07-11 ENCOUNTER — Encounter: Admit: 2019-07-11 | Payer: PRIVATE HEALTH INSURANCE | Attending: Speech-Language Pathologist | Primary: Family Medicine

## 2019-07-11 NOTE — Progress Notes
[facility]- Routine Waitlist Case ReviewREFERRING PROVIDER: Ether Griffins PRIMARY CARE PROVIDER: Dinh, Ay[place] , [id number] AGE:  59 y.o. GENDER:  male ETHNICITY:  Other/Not Listed ETIOLOGY:  Focal Glomerular Sclerosis (Focal Segmental - FSG) DIALYSIS CENTER  [facility] HD/PD AND HOME [address] [place] [id number] Patient Active Problem List Diagnosis SNOMED [place](R) ? 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 Hyperparathyroidism (HC Code) HYPERPARATHYROIDISM ? 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 ROD (renal osteodystrophy) RENAL OSTEODYSTROPHY ? Sepsis (HC Code) SEPSIS ? Infected prosthetic vascular graft, initial encounter (HC Code) VASCULAR GRAFT INFECTION ? Hyperkalemia HYPERKALEMIA ? Complication of arteriovenous dialysis fistula, subsequent encounter DISORDER OF SURGICAL ARTERIOVENOUS FISTULA ? Morbid obesity (HC Code) MORBID OBESITY ? End-stage renal disease (HC Code) END-STAGE RENAL DISEASE ? Secondary hyperparathyroidism of renal origin (HC Code) HYPERPARATHYROIDISM DUE TO RENAL INSUFFICIENCY ? Colon polyps POLYP OF COLON ? Internal hemorrhoids INTERNAL HEMORRHOIDS ? Mediastinal mass MEDIASTINAL MASS ? Arteriovenous fistula stenosis, subsequent encounter ARTERIOVENOUS FISTULA STENOSIS ? AVF (arteriovenous fistula) (HC Code) ARTERIOVENOUS FISTULA ? Anemia in ESRD (end-stage renal disease) (HC Code) ANEMIA IN END STAGE RENAL DISEASE Adam Harper is currently listed actively for transplant.  Notified today by referring provider that PTH has been uptrending despite therapy.  Most recent labs from [facility]:20 Jun 2019  Parathyrin.intact [Mass/volume] in Serum or Plasma  2035 pg/mL  ? 18 - 80  Patient has been referred to Dr. Gailen Shelter and will be proceeding with a parathyroidectomy in May. [facility] team will follow up with patient 4 weeks s/p parathyroidectomy. Additional testing required at this time:- Await parathyroidectomy

## 2019-07-13 ENCOUNTER — Telehealth: Admit: 2019-07-13 | Payer: PRIVATE HEALTH INSURANCE | Attending: Nephrology | Primary: Family Medicine

## 2019-07-13 ENCOUNTER — Ambulatory Visit: Admit: 2019-07-13 | Payer: PRIVATE HEALTH INSURANCE | Attending: Internal Medicine | Primary: Family Medicine

## 2019-07-13 DIAGNOSIS — Z20822 Contact with and (suspected) exposure to covid-19: Secondary | ICD-10-CM

## 2019-07-13 NOTE — Telephone Encounter
This is Ravi's patient

## 2019-07-13 NOTE — Telephone Encounter
Pt having Parathyroidectomy on 5/4.  Office would like Nephrology clearance.  Forwarding to Dr. Celedonio Savage.

## 2019-07-13 NOTE — Telephone Encounter
Adam Harper from Dr. Ricardo Jericho office called requesting clearance documentation for patient surgery that is scheduled on 07/17/19. Adam Harper can be reach at [phone] - Fax [phone].

## 2019-07-16 ENCOUNTER — Encounter: Admit: 2019-07-16 | Payer: PRIVATE HEALTH INSURANCE | Attending: Nephrology | Primary: Family Medicine

## 2019-07-16 NOTE — Telephone Encounter
Spoke wit Lendon Colonel at Dr. Ricardo Jericho office.  Advised her of the following message from Dr. Kandis Fantasia; Please let them know that PCP and cardiology have recently seen this patient and have recommended no further testing prior to surgery. As per nephrology, ok to proceed with surgery as planned (I have personally spoken to Dr. Linward Headland about this). Transplant nephrology also aware and wants him to get the surgery prior to reactivating him on the list. Lendon Colonel advised me that Dr. Zannie Kehr has asked for something in writing that she's been cleared from a Nephrology standpoint.  I advised her that I would forward the request to Dr. Kandis Fantasia.  Pt having the procedure tomorrow.  Note can be faxed to Fax [phone] in attention of Lendon Colonel.

## 2019-08-01 ENCOUNTER — Encounter: Admit: 2019-08-01 | Payer: PRIVATE HEALTH INSURANCE | Primary: Family Medicine

## 2019-08-02 ENCOUNTER — Encounter: Admit: 2019-08-02 | Payer: PRIVATE HEALTH INSURANCE | Primary: Family Medicine

## 2019-08-08 ENCOUNTER — Encounter: Admit: 2019-08-08 | Payer: PRIVATE HEALTH INSURANCE | Attending: Nephrology | Primary: Family Medicine

## 2019-08-08 MED ORDER — CALCIUM CARBONATE 400 MG CALCIUM (1,000 MG) CHEWABLE TABLET
1000 mg (400 mg calcium) | ORAL_TABLET | Freq: Four times a day (QID) | ORAL | 4 refills | Status: AC
Start: 2019-08-08 — End: 2019-08-20

## 2019-08-08 MED ORDER — CALCITRIOL 0.5 MCG CAPSULE
0.5 MCG | ORAL_CAPSULE | Freq: Two times a day (BID) | ORAL | 4 refills | Status: SS
Start: 2019-08-08 — End: 2019-11-10

## 2019-08-09 ENCOUNTER — Telehealth: Admit: 2019-08-09 | Payer: PRIVATE HEALTH INSURANCE | Attending: Nephrology | Primary: Family Medicine

## 2019-08-09 NOTE — Telephone Encounter
I received a call from dialysis unit stating that [name] was not able to get calcitriol. I called and spoke to pharmacy and they stated a prior Berkley Harvey is required. I then called patient with help of Spanish interpreter ID [phone]. Patient tells me he had same issues with prior prescription of calcitriol and he ended up paying out of pocket (has not infomred [place] or his prior prescriber about this). He states he ran out of calictriol yesterday. I explained prior Berkley Harvey can take a day or two and advised him to buy 1 weeks worth of supply until we get prior auth approved. I also went over calcium supplements. He tells me he is only taking tums extra (750 mg) 3 pills, three times a day (while I advised him to take 3 pills of tums ultra (1000 mg) four times a day). I again discussed the dose I wanted him to take given his low calcium levels post PTX. I explained that if he wishes to complete tums that he has at home, he should take 4 pills four times a day (total 16 pills). I went over these instructions a couple of times again and he was able to repeat back to me. I also discussed symptoms of hypocaclemia again with patient and advised to present to ER if they are persistent.I called dialysis unit and let charge RN Archie Patten know the plan and advised that charge RN tomorrow reiterate the plan again tomorrow. We will be going stat Ca, P and Mg qtreatment until his Ca levels stabilize.Adam Harper, MDNephrology

## 2019-08-10 ENCOUNTER — Encounter: Admit: 2019-08-10 | Payer: PRIVATE HEALTH INSURANCE | Attending: Nephrology | Primary: Family Medicine

## 2019-08-10 DIAGNOSIS — N186 End stage renal disease: Secondary | ICD-10-CM

## 2019-08-10 MED ORDER — ATORVASTATIN 80 MG TABLET
80 mg | ORAL_TABLET | Freq: Every day | ORAL | 4 refills | Status: SS
Start: 2019-08-10 — End: 2019-11-10

## 2019-08-17 ENCOUNTER — Encounter: Admit: 2019-08-17 | Payer: PRIVATE HEALTH INSURANCE | Primary: Family Medicine

## 2019-08-20 ENCOUNTER — Encounter: Admit: 2019-08-20 | Payer: PRIVATE HEALTH INSURANCE | Attending: Nephrology | Primary: Family Medicine

## 2019-08-20 MED ORDER — CALCIUM CARBONATE 400 MG CALCIUM (1,000 MG) CHEWABLE TABLET
1000 mg (400 mg calcium) | ORAL_TABLET | Freq: Four times a day (QID) | ORAL | 4 refills | Status: SS
Start: 2019-08-20 — End: 2019-11-10

## 2019-08-20 MED ORDER — CALCIUM ACETATE(PHOSPHATE BINDERS) 667 MG TABLET
667 mg | ORAL_TABLET | 12 refills | Status: AC
Start: 2019-08-20 — End: 2019-11-16

## 2019-08-22 ENCOUNTER — Encounter: Admit: 2019-08-22 | Payer: PRIVATE HEALTH INSURANCE | Attending: Speech-Language Pathologist | Primary: Family Medicine

## 2019-08-29 ENCOUNTER — Encounter: Admit: 2019-08-29 | Payer: PRIVATE HEALTH INSURANCE | Primary: Family Medicine

## 2019-08-29 NOTE — Progress Notes
Waitlist [id number]-ActiveAuth. obtained

## 2019-08-29 NOTE — Progress Notes
Auth for kidney transplant is due to expire 09/14/19, request sent to clinical team to fax form that is attached to email along with clinical to [facility] at fax#[phone]

## 2019-08-31 ENCOUNTER — Encounter: Admit: 2019-08-31 | Payer: PRIVATE HEALTH INSURANCE | Attending: Nursing | Primary: Family Medicine

## 2019-08-31 NOTE — Progress Notes
[name] will be scheduled for a Tuesday revisit per Thursday team meeting discussion. Associate coordinator notified to schedule with patient.Electronically Signed by Cherre Robins, RN, August 31, 2019

## 2019-09-04 ENCOUNTER — Encounter: Admit: 2019-09-04 | Payer: PRIVATE HEALTH INSURANCE | Primary: Family Medicine

## 2019-09-11 ENCOUNTER — Encounter: Admit: 2019-09-11 | Payer: PRIVATE HEALTH INSURANCE | Primary: Family Medicine

## 2019-09-13 ENCOUNTER — Encounter: Admit: 2019-09-13 | Payer: PRIVATE HEALTH INSURANCE | Primary: Family Medicine

## 2019-09-13 NOTE — Progress Notes
Revisit Clinic-7/13/21Pt is schedued for revisit clinicRTE-BCMCR-ActiveRTE-QMB-ActiveAuth required, due to expire 09/14/19, sent clinical request to Kidney team to fax updated clinical for auth renewal.Please fax from with clinical to :BCMCRAttn: Transplant Dept.Fax-[phone]# [id number]

## 2019-09-19 ENCOUNTER — Encounter: Admit: 2019-09-19 | Payer: PRIVATE HEALTH INSURANCE | Attending: Clinical Genetics (M.D.) | Primary: Family Medicine

## 2019-09-19 ENCOUNTER — Telehealth: Admit: 2019-09-19 | Payer: PRIVATE HEALTH INSURANCE | Primary: Family Medicine

## 2019-09-19 ENCOUNTER — Encounter: Admit: 2019-09-19 | Payer: PRIVATE HEALTH INSURANCE | Primary: Family Medicine

## 2019-09-19 DIAGNOSIS — Z006 Encounter for examination for normal comparison and control in clinical research program: Secondary | ICD-10-CM

## 2019-09-20 ENCOUNTER — Encounter: Admit: 2019-09-20 | Payer: PRIVATE HEALTH INSURANCE | Primary: Family Medicine

## 2019-09-20 NOTE — Progress Notes
Per Flint Melter on email regarding updating auth.Per call from lisa from [employer] his auth doesn't exp's unless he get's transplanted or has new insurance so I didn't have to send any updated clinicals

## 2019-09-25 ENCOUNTER — Ambulatory Visit: Admit: 2019-09-25 | Payer: PRIVATE HEALTH INSURANCE | Primary: Family Medicine

## 2019-09-25 ENCOUNTER — Ambulatory Visit: Admit: 2019-09-25 | Payer: PRIVATE HEALTH INSURANCE | Attending: Pharmacy | Primary: Family Medicine

## 2019-09-26 ENCOUNTER — Encounter: Admit: 2019-09-26 | Payer: PRIVATE HEALTH INSURANCE | Primary: Family Medicine

## 2019-09-26 NOTE — Progress Notes
Revisit Clinic-7/20/21Pt is schedued for revisit [email or web address]-[id number]-ActiveRTE-QMB-ActiveAuth required/obtained

## 2019-09-26 NOTE — Progress Notes
Revisit Clinic-10/02/19-rescheduled from 7/13/21Pt is schedued for revisit [id number]-[id number]-ActiveRTE-QMB-ActiveAuth required/obtained

## 2019-10-02 ENCOUNTER — Encounter: Admit: 2019-10-02 | Payer: PRIVATE HEALTH INSURANCE | Attending: Speech-Language Pathologist | Primary: Family Medicine

## 2019-10-02 ENCOUNTER — Ambulatory Visit: Admit: 2019-10-02 | Payer: PRIVATE HEALTH INSURANCE | Primary: Family Medicine

## 2019-10-02 ENCOUNTER — Ambulatory Visit: Admit: 2019-10-02 | Payer: PRIVATE HEALTH INSURANCE | Attending: Neurology | Primary: Family Medicine

## 2019-10-02 ENCOUNTER — Telehealth: Admit: 2019-10-02 | Payer: PRIVATE HEALTH INSURANCE | Attending: Advanced Practice Nursing | Primary: Family Medicine

## 2019-10-02 ENCOUNTER — Ambulatory Visit: Admit: 2019-10-02 | Payer: PRIVATE HEALTH INSURANCE | Attending: Pharmacotherapy | Primary: Family Medicine

## 2019-10-02 ENCOUNTER — Inpatient Hospital Stay: Admit: 2019-10-02 | Discharge: 2019-10-02 | Payer: PRIVATE HEALTH INSURANCE | Primary: Family Medicine

## 2019-10-02 ENCOUNTER — Ambulatory Visit: Admit: 2019-10-02 | Payer: PRIVATE HEALTH INSURANCE | Attending: Nephrology | Primary: Family Medicine

## 2019-10-02 ENCOUNTER — Encounter: Admit: 2019-10-02 | Payer: PRIVATE HEALTH INSURANCE | Attending: Neurology | Primary: Family Medicine

## 2019-10-02 DIAGNOSIS — T827XXA Infection and inflammatory reaction due to other cardiac and vascular devices, implants and grafts, initial encounter: Secondary | ICD-10-CM

## 2019-10-02 DIAGNOSIS — Z01818 Encounter for other preprocedural examination: Secondary | ICD-10-CM

## 2019-10-02 DIAGNOSIS — N186 End stage renal disease: Secondary | ICD-10-CM

## 2019-10-02 DIAGNOSIS — E7800 Pure hypercholesterolemia, unspecified: Secondary | ICD-10-CM

## 2019-10-02 DIAGNOSIS — K7460 Unspecified cirrhosis of liver: Secondary | ICD-10-CM

## 2019-10-02 DIAGNOSIS — N2581 Secondary hyperparathyroidism of renal origin: Secondary | ICD-10-CM

## 2019-10-02 DIAGNOSIS — G4733 Obstructive sleep apnea (adult) (pediatric): Secondary | ICD-10-CM

## 2019-10-02 DIAGNOSIS — Z125 Encounter for screening for malignant neoplasm of prostate: Secondary | ICD-10-CM

## 2019-10-02 DIAGNOSIS — I770 Arteriovenous fistula, acquired: Secondary | ICD-10-CM

## 2019-10-02 DIAGNOSIS — Z7682 Awaiting organ transplant status: Secondary | ICD-10-CM

## 2019-10-02 DIAGNOSIS — D649 Anemia, unspecified: Secondary | ICD-10-CM

## 2019-10-02 DIAGNOSIS — R760 Raised antibody titer: Secondary | ICD-10-CM

## 2019-10-02 DIAGNOSIS — I10 Essential (primary) hypertension: Secondary | ICD-10-CM

## 2019-10-02 DIAGNOSIS — I712 Thoracic aortic aneurysm (HC Code): Secondary | ICD-10-CM

## 2019-10-02 DIAGNOSIS — K219 Gastro-esophageal reflux disease without esophagitis: Secondary | ICD-10-CM

## 2019-10-02 DIAGNOSIS — J101 Influenza due to other identified influenza virus with other respiratory manifestations: Secondary | ICD-10-CM

## 2019-10-02 DIAGNOSIS — E213 Hyperparathyroidism, unspecified: Secondary | ICD-10-CM

## 2019-10-02 DIAGNOSIS — N051 Unspecified nephritic syndrome with focal and segmental glomerular lesions: Secondary | ICD-10-CM

## 2019-10-02 LAB — CBC WITH AUTO DIFFERENTIAL
BKR ALANINE AMINOTRANSFERASE (ALT): 0.2 x 1000/??L (ref 0.0–1.0)
BKR BLOOD UREA NITROGEN: 9.5 fL — ABNORMAL HIGH (ref 6.0–11.0)
BKR WAM ABSOLUTE IMMATURE GRANULOCYTES: 0 x 1000/ÂµL (ref 0.0–0.3)
BKR WAM ABSOLUTE LYMPHOCYTE COUNT: 1.3 x 1000/ÂµL (ref 1.0–4.0)
BKR WAM ABSOLUTE NRBC: 0 x 1000/ÂµL (ref 0.0–0.0)
BKR WAM ANALYZER ANC: 2.6 x 1000/ÂµL (ref 1.0–11.0)
BKR WAM BASOPHIL ABSOLUTE COUNT: 0 x 1000/ÂµL (ref 0.0–0.0)
BKR WAM BASOPHILS: 0.9 % (ref 0.0–4.0)
BKR WAM EOSINOPHIL ABSOLUTE COUNT: 0.2 x 1000/ÂµL (ref 0.0–1.0)
BKR WAM EOSINOPHILS: 5 % (ref 0.0–7.0)
BKR WAM HEMATOCRIT: 33 % — ABNORMAL LOW (ref 37.0–52.0)
BKR WAM HEMOGLOBIN: 11 g/dL — ABNORMAL LOW (ref 12.0–18.0)
BKR WAM IMMATURE GRANULOCYTES: 0.7 % (ref 0.0–3.0)
BKR WAM LYMPHOCYTES: 27.9 % (ref 8.0–49.0)
BKR WAM MCH (PG): 36.7 pg — ABNORMAL HIGH (ref 27.0–31.0)
BKR WAM MCHC: 33.3 g/dL (ref 31.0–36.0)
BKR WAM MCV: 110 fL — ABNORMAL HIGH (ref 78.0–94.0)
BKR WAM MONOCYTE ABSOLUTE COUNT: 0.4 x 1000/??L (ref 0.0–2.0)
BKR WAM MONOCYTES: 9.2 % (ref 4.0–15.0)
BKR WAM MPV: 9.5 fL (ref 6.0–11.0)
BKR WAM NEUTROPHILS: 56.3 % (ref 37.0–84.0)
BKR WAM NUCLEATED RED BLOOD CELLS: 0 % (ref 0.0–1.0)
BKR WAM PLATELETS: 190 x1000/??L (ref 140–440)
BKR WAM RDW-CV: 13.9 % (ref 11.5–14.5)
BKR WAM RED BLOOD CELL COUNT: 3 M/??L — ABNORMAL LOW (ref 3.8–5.9)
BKR WAM WHITE BLOOD CELL COUNT: 4.6 x1000/ÂµL (ref 4.0–10.0)

## 2019-10-02 LAB — PTH, INTACT WITHOUT CALCIUM: BKR PARATHYROID HORMONE INTACT: 47.3 pg/mL (ref 15.0–65.0)

## 2019-10-02 LAB — COMPREHENSIVE METABOLIC PANEL
BKR A/G RATIO: 1.5 (ref 1.0–2.2)
BKR ALBUMIN: 4.6 g/dL (ref 3.6–4.9)
BKR ALKALINE PHOSPHATASE: 104 U/L (ref 9–122)
BKR ANION GAP: 17 (ref 7–17)
BKR ASPARTATE AMINOTRANSFERASE (AST): 17 U/L (ref 10–35)
BKR AST/ALT RATIO: 1.1 pg/mL (ref 15.0–65.0)
BKR BILIRUBIN TOTAL: 0.4 mg/dL (ref <=1.2)
BKR BUN / CREAT RATIO: 3.4 % — ABNORMAL LOW (ref 8.0–23.0)
BKR CALCIUM: 9.3 mg/dL (ref 8.8–10.2)
BKR CHLORIDE: 96 mmol/L — ABNORMAL LOW (ref 98–107)
BKR CO2: 28 mmol/L (ref 20–30)
BKR CREATININE: 16.39 mg/dL — CR (ref 0.40–1.30)
BKR EGFR (AFR AMER): 4 mL/min/{1.73_m2} (ref >60)
BKR EGFR (NON AFRICAN AMERICAN): 3 mL/min/1.73m2 (ref >60)
BKR GLOBULIN: 3.1 g/dL (ref 2.3–3.5)
BKR GLUCOSE: 89 mg/dL (ref 70–100)
BKR POTASSIUM: 6.1 mmol/L — CR (ref 3.3–5.1)
BKR PROTEIN TOTAL: 7.7 g/dL (ref 6.6–8.7)
BKR SODIUM: 141 mmol/L (ref 136–144)

## 2019-10-02 LAB — HEPATITIS B SURFACE ANTIGEN     (BH GH L LMW YH): BKR HEPATITIS B SURFACE ANTIGEN: NEGATIVE

## 2019-10-02 LAB — HEPATITIS B CORE ANTIBODY, IGM: BKR HEPATITIS B CORE IGM ANTIBODY: NEGATIVE

## 2019-10-02 LAB — HEPATITIS C AB WITH REFLEX TO HCV PCR: BKR HEPATITIS C ANTIBODY: NEGATIVE

## 2019-10-02 LAB — HEPATITIS A ANTIBODY, IGM: BKR HEPATITIS A IGM ANTIBODY: NEGATIVE

## 2019-10-02 NOTE — Progress Notes
The following is the transcribed Review of Systems, done by the patient at Intake, and attached to this is the attending physician's documentation.Review of Systems Constitutional: Negative.  HENT: Negative.  Eyes: Negative.  Respiratory: Negative.  Cardiovascular: Negative.  Gastrointestinal: Negative.  Genitourinary: Negative.  Musculoskeletal: Positive for back pain and myalgias. Skin: Negative.  Neurological: Positive for tingling. Endo/Heme/Allergies: Negative.  Psychiatric/Behavioral: Negative.

## 2019-10-02 NOTE — Other
TRANSPLANT PHARMACY:  REVISIT CLINICLuis A Harper is a 59 y.o. male is presenting to revisit clinic for follow-up.I updated his allergies, immunizations and medications.  We discussed expectations with regard to medications after transplant and the importance of adherence. [place] Medication Adherence Scale (MMAS) 10/02/2019 Do you sometimes forget to take your medicine? No People sometimes miss taking their medicines for reasons other than forgetting.  Thinking over the past 2 weeks, were there any days when you did not take your medication? No Have you ever cut back or stopped taking your medicine without telling your health care provider because you felt worse when you took it?  No When you travel or leave home, do you sometimes forget to bring along your medicine? No Did you take all of your medicines yesterday?  Yes When you feel your symptoms are under control, do you sometimes stop taking your medicine?  No Taking medicine every day is a real inconvenience for some people.  Do you ever feel hassled about sticking to your treatment plan? No How often do you have difficulty remembering to take all of your medicine? Never/rarely [place] Medication Adherence Score 8 MMAS Score Interpretation High Adherence  	Based on answers to survey, there is minimal concern for unintentional medication non-adherence. Patient reports sometimes facing some financial barriers with his medications such as calcitriol but he is highly motivated to adhere to his medication regimen.Patient was also asked the following questions and provided these responses:Who manages your medications?: Self (and his wife)How do you organize your medications?: Pill boxDo you ever have difficulties obtaining your medications?: Yes (with comments) (Some high cost medications)Do you use chronic pain medication, including medical marijuana, currently?: NoDo you have a history of diabetes or high blood sugar?: NoWith regard to induction, Adam Harper has a CPRA of 0. Per protocol, recommend induction with Alemtuzumab pending final crossmatch and maintenance with Tacrolimus (Prograf), Mycophenolate Mofetil (Cellcept) and Prednisone.No other concerns with regards to medications for transplant. Adam Harper, PharmDJuly 20, 20211:15 PM

## 2019-10-02 NOTE — Progress Notes
[facility]:  Anterior mediastinal imaging abnormality and positive AChR binding autoantibodiesHPI: Adam Harper is a 59 y.o. 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  Please refer to prior notes for additional details.  Patient was last seen in Jan 2021.Since the time of his last visit he denies any ptosis, diplopia, fatigable limb weakness, dysphagia, fatigue chewing, choking on food, new dyspnea.  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  Repetitive nerve stimulation testing was normal.Patient was referred to thoracic surgery, Dr. Westly Pam, who did not think that patient's mediastinal abnormality was a thymoma but rather likely a benign cyst, and stable over the last 4-5 years comparing repeat imaging.Past Medical History: Diagnosis Date ??? A-V fistula (HC Code)   right arm ??? Anemia  ??? AV fistula infection (HC Code)  ??? Cirrhosis (HC Code)  ??? 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 GERD (gastroesophageal reflux disease)  ??? Hypercholesteremia  ??? Hyperparathyroidism (HC Code)  ??? Hypertension  ??? Influenza A  ??? Morbid obesity (HC Code) 02/03/2018 ??? Obstructive sleep apnea   pt uses CPAP ??? Secondary hyperparathyroidism of renal origin (HC Code)  ??? Thoracic aortic aneurysm (HC Code)  Past Surgical History: Procedure Laterality Date ??? AV FISTULA PLACEMENT Left   left lower arm unsuccessful, then moved to upper arm 2011, then had problems in 10/2013 - infection Allergies: Patient has no known allergies.Current Outpatient Medications Medication Sig ??? amLODIPine Take 1 tablet (10 mg total) by mouth every morning. ??? atorvastatin Take 1 tablet (80 mg total) by mouth daily. ??? calcitrioL Take 2 capsules (1 mcg total) by mouth 2 times daily (0900, 1700). ??? calcium acetate(phosphat bind) Take 3 tabs with each meal and 2 tabs with each snack, total 11 per day ??? calcium carbonate Take 4 tablets (4,000 mg total) by mouth every 6 (six) hours. ??? carvediloL TAKE 1 TABLET(25 MG) BY MOUTH TWICE DAILY WITH BREAKFAST AND DINNER ??? DARBepoetin alfa-polysorbate Inject 0.3 mLs (60 mcg total) under the skin every 7 days. In dialysis ??? diclofenac Apply topically 4 (four) times daily. ??? epoetin alfa (EPOGEN INJ) Inject 3,200 Units under the skin Every Monday, Wednesday, and Friday. ??? iron sucrose complex (VENOFER IV) Inject 50 mg into the vein Every Monday. ??? lisinopriL Take 1 tablet (40 mg total) by mouth daily. ??? LORazepam Take 1 tablet (1 mg total) by mouth once as needed for anxiety (take 30 minutes prior to MRI for anxiety) for up to 1 dose. ??? omeprazole Take 1 capsule (20 mg total) by mouth daily. ??? oxyCODONE Take 1 tablet (5 mg total) by mouth every 4 (four) hours as needed. ??? triamcinolone Apply topically 2 (two) times daily. No current facility-administered medications for this visit.  Family History:  Patient denies any known family history of autoimmunity.Social History Tobacco Use ??? Smoking status: Former Smoker   Packs/day: 0.25   Years: 25.00   Pack years: 6.25   Types: Cigarettes ??? Smokeless tobacco: Never Used Substance Use Topics ??? Alcohol use: No ??? Drug use: No Review of Systems:  I have reviewed and verified ROS.  Please refer to medical assistant note for details.  Physical  Exam BP (!) 141/78  - Pulse 86  - Ht 5' 9\ (1.753 m)  - Wt 117.9 kg  - SpO2 94%  - BMI 38.40 kg/m??? General Exam General: no apparent distress, pleasant and cooperative. Single breath count 25 out of 30.  Head: NC, ATLungs: clear to auscultation bilaterallyHeart: regular Abdomen: soft, non-tenderExtremities: extremities normal, atraumatic, no cyanosis or edema Neurological Exam Mental Status  Patient is alert, Oriented to person, place, time and situation.Cranial Nerves Nystagmus: absent. Ptosis:  Right- absent. Left- absent. II Pupils: PERRLVisual Fields- intact  EOM's- intact.  No diplopia on right or left horizontal gaze.  No diplopia on vertical up gaze.    V Facial Sensation-  Bilateral pin prick and soft touch normal.VII Facial Strength- symmetrical, strong bilaterally.  No myasthenic snarl. VIII Hearing: right- NL.  left- NL.  X Uvula/palate rise: midline and normal.  XI Sternocleidomastoid: right- 5/5. left- 5/5. Trapezius: right- 5/5. left- 5/5.  XII Tongue- midline, fasiculations absent. Speech- Moderate dysarthria on exam (pt notes that he has had this for many years and denies change). Motor Bulk- normal throughout  Tone- normal throughout  Grip: 		R- 5/5  L- 5/5 Finger ext:	R- 5/5  L- 5/5  Wrist flex:	R- 5/5  L- 5/5  Wrist ext:	R- 5/5  L- 5/5  Bicep:		R- 5/5  L- 5/5  Tricep:		R- 5/5  L- 5/5  No fatigue with repeat testing Deltoid:		R- 5/5  L- 5/5  No fatigue with repeat testing Neck Flex: 	5/5  No fatigue with repeat testing Neck ext: 	5/5  Iliopsoas:	R- 5/5  L- 5/5  No fatigue with repeat testing Quads:		R- 5/5  L- 5/5 Hamstr:		R- 5/5  L- 5/5  Dorsiflex:	R- 5/5  L- 5/5 Plantarflex:	R- 5/5  L- 5/5  JAMAR Dynamometry:  Rt- 40 kg   --  Lt- 40 kgSensory  Light Touch- normal, no extinction. CerebellarFinger to nose- normal bilaterally.  Heel to shin - normal bilaterally.  Gait normal stance and stride. Reflexes 1-2 and symmetric                   MG-ADL Score:   0 (refer to source document for details)MGC Score:   0 (refer to source document for details)MGFA Clinical Classification: NAMGFA PIS: NAAssessment Adam Harper is a 59 y.o. 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  Patient's clinical exam remains stable since the time of his last evaluation in 2020 with no signs or symptoms of active myasthenia gravis at this time.  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  I am reassured by patient has normal electrodiagnostic studies, specifically repetitive nerve stimulation, which did not demonstrate evidence for a disorder of postsynaptic neuromuscular transmission.  Repeat antibody level was decreased and just slightly above the reference range cut off.  Suspect that this might be a false-positive and could be due to patient's end-stage renal disease.  My suspicion for autoimmune myasthenia gravis is low at this time. I recommend that we follow patient's clinical course over time and will schedule a follow-up visit with me in approximately 12 months. My overall impression and recommendations were reviewed with patient today. I did review with patient typical signs and symptoms of myasthenia gravis, in ask that patient contact us should any of these symptoms  emerge or with any specific questions.  All questions answered at time of our visit.  Today's visit was conducted with interpreter services.Plan1. Follow clinical course2. Monitor for signs and symptoms of active disease3. Clinic follow-up in 12 months Patient instructed to contact the office with any questions or change in symptoms immediately.  I have also asked that patient follow-up with their primary care physician for any issues not addressed as part of visit today in the [facility]. Today's visit was completed with in-person Spanish language interpreter services.Please excuse any transcription errors as the above record was in part created utilizing medical dictation software.Electronically Signed by Adam Cloud, MD, October 02, 2019

## 2019-10-02 NOTE — Progress Notes
SOCIAL WORK ASSESSMENTPatient Name: Adam Harper Record Number: [id number] Date of Birth: October 24, 1962Social Work Assessment Adult    Most Recent Value Rendered Accommodations (Leave blank if none rendered or patient supplied their own hearing devices/glasses) Other language interpreter used (non-ASL)?  Yes Language used for interpretation  Spanish Language interpreter type  utilized qualified interpreter Interpreter name  Adam Harper Interpreter ID  [id number] Admission Information Document Type  Clinical  Assessment Reason for Encounter  Care Decision Concerns/Lack of Decision-Maker Intervention  Decision-Making/Care Competence Assessment & Resolution Decision-Making/Care Competence Assessment & Resolution  Pre-Transplant evaluation Source of Information  Patient Record Reviewed  Yes Level of Care  Ambulatory Patients Legal Contacts Legal Custody Status  Self Past/Current Department of Children & Families Involvement  No Legal Admission Status  None Legal/Judicial Status  None Currently on Probation / Parole?  No Legal Permission Granted to Share Information   No Language needed  Spanish Current Providers and Community Involvement Current Providers and Community Involvement A-L  Dialysis Dialysis Practice Name  see transplant section Dialysis Provider Name  see transplant section Dialysis Phone Number  see transplant section Current Providers and Community Involvement M-Z  None Relationships Marital Status  Married Significant Relationships  Spouse, Adult Children, Brother, Sister Family circumstances  Married. Two adult children. Siblings. Quality of Family Relationships  Supportive Support System  No concerns Noted Separation/Losses (recent):  No Lives With  Spouse Sources of Support  spouse Sexual history pertinent to current situation/hospitalization  none Need for family/caregiver participation in care  yes Need for Family Participation Comment  post-transplant education and recovery Mandated Referral Mandated Report Required  no Physical/Sexual Abuse History History of personal victimization  Deferred Physical/Sexual Abuse Victimization  deferred Physical/Sexual Abuse Perpetration  deferred Other Sexually Reactive Behaviors  deferred Education - Adult Education - Adult  some college Current Education Enrollment  None Literacy  Read/write independently Special Needs  Materials need to be in Spanish Employment/Income/Finance/Insurance [employer]  No Financial Concerns Identified  No Financial Barriers to accessing medical care   None Employment Status  Disabled Housing / Transportation/ Environment Living Arrangements for the past 2 months  Apartment Housing-Related Financial Concerns  None Able to Return to Prior Arrangements  yes Able to Receive Visiting Nurse at Prior Living Arrangement  Yes Housing-Related Environmental Concerns  No concerns Has utility company threatened to shut off services?  No Food Availability  No Concerns Are you dependent upon healthcare-related transportation?  No Do you have any transportation related concerns that impact your ability to take care of yourself?  No Patient is accruing charges in a parking lot related to this hospitalization?  No Transportation Comment  Walked to appointment today,  lives close to hospital Mental Status Observation of Mental Status has identified Notable Findings  No History of Psychiatric Illness/Diagnosis  none reported Substance Use Substances Used  Deferred Active substance abuse  Deferred Alcohol Use Alcohol Use  Deferred Substance Use, Caregiver Caregiver Substance Use  Unable to Assess Transplant Transplant Status  Pre-transplant Organ  Kidney Patient is the   [facility]  No Primary Insurance  Medicare Secondary Insurance Medigap Adequate prescription coverage  Yes Patient is on dialysis  Yes Type of Dialysis  HD Dialysis Unit  [facility] D[facility] Schedule  Mon/Wed/Fri Adherence  reports good adherence Patient/Caregiver understanding of risks and benefits of transplantion  Good Patient/Caregiver understanding of risks and benefits of transplantion comment  good understanding Patient's report of adherence to medical regimen  Fully adherent and  develops effective partnership with medical staff Patient concerns toward transplantation  No concerns identified Caregiver concerns toward transplantation  No concerns identified Patient's Coping Abilities  Coping adequately at this time Patient's Coping Strategies  Support network Transplant issues discussed with patient/family  Frequency/importance of clinic visits, Hospitalization and recovery process, Importance of medical/medication adherence, Importance of support system, Importance of reliable transportation Needs Assessment  Concerns to be Addressed  no discharge needs identified Needs in the Community  none Anticipated Facility/Agency/Outpatient/Support Group Need(s)   none Narrative/Signoff Identified Clinical/Disposition, Issues/Barriers:  [name] is inactive on the kidney transplant waiting list and comes to revisit clinic today due to possible status change.  I met with [name] to update his pre-kidney transplant evaluation from 2012.   I have reviewed previous assessments completed by LCSW Gaffney and LCSW Petillo (2020).  Since that time there have been no significant changes in his psychosocial situation. Intervention(s)/Summary  20 minutes were spent in direct contact with [name]. Because his primary language is Spanish, a Armed forces operational officer was utilized Armed forces training and education officer and [employer]).  I provided psychosocial education related to kidney transplantation and assessed all relevant psychosocial domains.  His wife will be his main support post-transplant in addition to siblings and other family members. This support includes transportation to appointments. [name] was actively engaged and asked appropriate questions. I have reviewed the outcome of the [place] Medication Adherence Scale (MMAS-8), administered by the Transplant Pharmacist today.  Based on [name]'s high score, no for further Social Work intervention regarding medication adherence is warranted.  I completed the [employer] Integrated Psychosocial Assessment for Transplant (SIPAT) tool. [employer] Integrated Psychosocial Assessment for Transplant (SIPAT) 10/02/2019 SIPAT Score 13 SIPAT Score Interpretation Good candidate   Collaboration with Treatment Team/Community Providers/Family:  As above. Referral(s) placed for:  None Outcome  Resolved Handoff Required?  No Barriers to Discharge  no barriers identified Next Steps/Plan (including hand-off):  Based on my assessment, review of the available collateral information, and the SIPAT score of 13, [name] is a good candidate and I recommend listing for transplantation although monitoring of identified risk factors may be required.  I will remain available as needed to the patient, family, and transplant team for future transplant related psychosocial issues. Do you intend to allow this note to be shared with your patient?  Yes Signature:  Mikel Cella LCSWKidney Transplant Social Worker Contact Information:  [phone]

## 2019-10-02 NOTE — Telephone Encounter
LogEventMarland Kitchen [name] #: [id number] Sent: 10/02/2019 15:05 MTCaller Name: Tresa Moore: [id number] MWU:XLKG: Lurline Idol Type: General MessageMessage: Pt: Name: Adam Harper: [birth year]MD:You are seen at [facility] transplant in [place], correct?Pre or Post:Organ Type:RE: Critical result for potassium and creatResults for Adam Harper, Adam Harper (MRN [id number]) as of 10/02/2019 18:06 Ref. Range 10/02/2019 14:14 Potassium Latest Ref Range: 3.3 - 5.1 mmol/L 6.1 [facility]) Results for Adam Harper, Adam Harper (MRN [id number]) as of 10/02/2019 18:06 Ref. Range 10/02/2019 14:14 Creatinine Latest Ref Range: 0.40 - 1.30 mg/dL 25.95 (HH) Patient was seen for waitlist clinic today and is maintained on dialysis. Per chart review, patient receives dialysis MWF. Reviewed with Dr. Revonda Standard. No intervention at this time if patient is to receive dialysis tomorrow. If patient would ike he can go to ED tonight. Called to update patient. Unable to reach. Left VM with call back number. Will have pre-kidney coordinators notify patient's dialysis unit of hyperkalemia tomorrow morning.

## 2019-10-02 NOTE — Patient Instructions
Please call [phone] with any questions.  Please ask to speak with nurse Debarah Crape.Plan for follow-up in 1 year.

## 2019-10-03 ENCOUNTER — Telehealth: Admit: 2019-10-03 | Payer: PRIVATE HEALTH INSURANCE | Primary: Family Medicine

## 2019-10-03 ENCOUNTER — Inpatient Hospital Stay: Admit: 2019-10-03 | Discharge: 2019-10-03 | Payer: PRIVATE HEALTH INSURANCE | Primary: Family Medicine

## 2019-10-03 ENCOUNTER — Encounter: Admit: 2019-10-03 | Payer: PRIVATE HEALTH INSURANCE | Primary: Family Medicine

## 2019-10-03 ENCOUNTER — Telehealth: Admit: 2019-10-03 | Payer: PRIVATE HEALTH INSURANCE | Attending: Nursing | Primary: Family Medicine

## 2019-10-03 DIAGNOSIS — N186 End stage renal disease: Secondary | ICD-10-CM

## 2019-10-03 DIAGNOSIS — I10 Essential (primary) hypertension: Secondary | ICD-10-CM

## 2019-10-03 LAB — QUANTIFERON-TB
BKR QUANTIFERON-TB GOLD IN-TUBE: NEGATIVE
BKR QUANTIFERON-TB MITOGEN MINUS NIL: 10 [IU]/mL
BKR QUANTIFERON-TB NIL: 0 IU/mL
BKR QUANTIFERON-TB1 MINUS NIL: 0 IU/mL (ref <0.35)
BKR QUANTIFERON-TB2 MINUS NIL: 0 [IU]/mL (ref <0.35)

## 2019-10-03 LAB — EPSTEIN-BARR VCA ANTIBODY, IGG     (GH LMW YH)
BKR EPSTEIN-BARR VCA IGG: POSITIVE x 1000/??L (ref 1.0–4.0)
BKR VCA IGG AB SCREEN INITIAL RESULT: >750 U/mL

## 2019-10-03 LAB — TREPONEMA PALLIDUM (SYPHILIS) ANTIBODY W/REFLEX
BKR TREPONEMA PALLIDUM ANTIBODY INITIAL RESULT: <0.1 Index
BKR TREPONEMA PALLIDUM ANTIBODY TOTAL, SERUM: NONREACTIVE

## 2019-10-03 LAB — CYTOMEGALOVIRUS ANTIBODY, IGM     (BH GH L Q YH)
BKR CMV IGM INITIAL RESULT: <8 AU/mL
BKR CYTOMEGALOVIRUS IGM ANTIBODY: NEGATIVE

## 2019-10-03 LAB — EPSTEIN-BARR VCA ANTIBODY, IGM     (BH GH LMW YH)
BKR EBV VCA IGM INITIAL RESULT: <10 U/mL
BKR EPSTEIN-BARR VCA IGM: NEGATIVE

## 2019-10-03 LAB — CYTOMEGALOVIRUS ANTIBODY, IGG
BKR CMV IGG INITIAL RESULT: 8.9 U/mL
BKR CYTOMEGALOVIRUS IGG ANTIBODY: POSITIVE — AB

## 2019-10-03 LAB — EBNA-1 ANTIBODY, IGG     (BH GH LMW YH)
BKR EBNA-1 ANTIBODY, IGG: POSITIVE
BKR EBV EBNA INITIAL RESULT: >600 U/mL

## 2019-10-03 LAB — PSA, TOTAL (SCREENING) (BH GH L LMW YH): BKR PROSTATE SPECIFIC ANTIGEN, SCREENING: 0.233 ng/mL (ref 0.000–3.900)

## 2019-10-03 LAB — EBV SEROLOGY INTERPRETATION     (BH GH LMW YH)

## 2019-10-03 NOTE — Progress Notes
Revisit Clinic-10/02/19-rescheduled from 7/13/21Pt arrived for revisit [id number]-[id number]-ActiveAuth required/obtained

## 2019-10-03 NOTE — Telephone Encounter
Received call from lab on critical creatinine result:Lab Results Component Value Date  K 6.1 (HH) 10/02/2019 Patient presented yesterday for kidney transplant waitlist evaluation appt, has known renal disease. Dialysis unit RN Denny Peon contacted and notified of critical potassium result above. RN will follow-up with MD. Results faxed to unit.Electronically Signed by Cherre Robins, RN, October 03, 2019

## 2019-10-03 NOTE — Telephone Encounter
Reminded Adam Harper about his echo appointment this afternoon. Confirmed time and that he would be there.

## 2019-10-04 ENCOUNTER — Encounter: Admit: 2019-10-04 | Payer: PRIVATE HEALTH INSURANCE | Primary: Family Medicine

## 2019-10-04 LAB — DRUG SCREEN 9 PANEL, SERUM/PLASMA W/REFLEX (YH)
AMPHETAMINES, S/P, SCREEN: NEGATIVE ng/mL (ref <0.35)
BARBITURATES, S/P, SCREEN: NEGATIVE ng/mL
BENZODIAZEPINES, S/P, SCREEN: NEGATIVE ng/mL
BUPRENORPHINE, S/P, SCREEN: NEGATIVE ng/mL (ref 4.0–10.0)
CANNABINOIDS, S/P, SCREEN: NEGATIVE ng/mL
COCAINE, S/P, SCREEN: NEGATIVE ng/mL
METHADONE, S/P, SCREEN: NEGATIVE ng/mL
METHAMPHETAMINE, S/P, SCREEN: NEGATIVE ng/mL
OPIATES, S/P, SCREEN: NEGATIVE ng/mL
OXYCODONE, S/P, SCREEN: NEGATIVE ng/mL
PHENCYCLIDINE, S/P, SCREEN: NEGATIVE ng/mL

## 2019-10-04 NOTE — Progress Notes
Discussed patient's case at Thursday Kidney Transplant meeting.Plan: Avoid DGF since patient is a high risk and wait for Echo result scheduled in 10/17/19.

## 2019-10-05 LAB — HLA PRE TRANSPLANT ANTIBODY EVALUATION (YMG)
HLA CLASS I ANTIBODY SPECIFICITY: NEGATIVE
HLA CLASS I ANTIBODY, PERCENT POSITIVE (ID): 0
HLA CLASS I ANTIBODY, TEST RESULT (ID): NEGATIVE
HLA CLASS II ANTIBODY SPECIFICITY: NEGATIVE
HLA CLASS II ANTIBODY, PERCENT POSITIVE (ID): 0
HLA CLASS II ANTIBODY, TEST RESULT (ID): NEGATIVE mmol/L (ref 20–30)

## 2019-10-05 NOTE — Progress Notes
[facility] Transplantation CenterKidney Transplant Program- Pre-Clinic ReviewREFERRING PROVIDER: Ether Griffins PRIMARY CARE PROVIDER: Esaw Dace:  Adam Harper , Adam Harper AGE:  59 y.o. 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 Hyperparathyroidism (HC Code) HYPERPARATHYROIDISM ? 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 ROD (renal osteodystrophy) RENAL OSTEODYSTROPHY ? Sepsis (HC Code) SEPSIS ? Infected prosthetic vascular graft, initial encounter (HC Code) VASCULAR GRAFT INFECTION ? Hypertension HYPERTENSIVE DISORDER ? Hypertriglyceridemia HYPERTRIGLYCERIDEMIA ? Hyperkalemia HYPERKALEMIA ? Complication of arteriovenous dialysis fistula, subsequent encounter DISORDER OF SURGICAL ARTERIOVENOUS FISTULA ? Obese OBESITY ? Anemia of chronic renal failure ANEMIA OF CHRONIC RENAL FAILURE ? 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 Colon polyps POLYP OF COLON ? Internal hemorrhoids INTERNAL HEMORRHOIDS ? Mediastinal mass MEDIASTINAL MASS ? Arteriovenous fistula stenosis, subsequent encounter ARTERIOVENOUS FISTULA STENOSIS ? AVF (arteriovenous fistula) (HC Code) ARTERIOVENOUS FISTULA ? 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 Alkaline phosphatase raised ALKALINE PHOSPHATASE RAISED ? Decreased testosterone level DECREASED TESTOSTERONE LEVEL ? Gastroesophageal reflux disease GASTROESOPHAGEAL REFLUX DISEASE ? Hypocalcemia HYPOCALCEMIA ? 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 Newborn affected by abnormality in fetal (intrauterine) heart rate or rhythm, unspecified as to time of onset SUSPECTED CLINICAL FINDING REASON FOR VISIT:   Adam Harper presents today for re-assessment in Transplant Clinic due to assessment for reactivation, s/p parathyroidectomy.  As part of wait list management, transplant candidacy was re-assessed.  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) EPTS (Calc):  52 KAS - 9.03Complex past medical history, outlined by transplant team in previous notes.Sensitizing Events: noneUrine Production: noneNursing Transplant Education: Barriers to learning:language, interpreter usedTopics discussed at this time: UNOS Information and Education Manual ProvidedTransplant Multidisciplinary TeamEvaluation ProcessEvaluation TestingCompletion of Evaluation for TransplantYNHTC Zero Tolerance Policy for Alcohol and Drug UseKidney Allocation SystemKDPI >85Monthly Red Top Required for Active ListingSurgical ProcedureAlternative TreatmentsPotential Medical/Psychosocial RisksMultiple Listing and Wait List TransferLive Donor Options and ProcessLive Donor HIPAA RequirementsPHS Risk CriteriaOrgan Donor Risk Factors - FunctionOrgan Donor Risk Factors - Disease TransmissionDeceased Donor Categories (DCD/DBD)Assessment of the DonorRight to Refuse TransplantScientific Registry of Transplant Recipient (SRTR) Outcomes - data provided with consent for evaluationMedicare ApprovalTransplantation by a Center which is not approved by [facility] Patient Service Line [phone] Hour Transplant Call Center Contact Information ([phone])Surgical ComplicationsImmunosuppression Risks/BenefitsImmunosuppression AdjustmentWound CareHealth MaintenanceAnnual Standard of Care TestingNeed for Periodic Testing as Indicated by the Transplant TeamSRTR Data: Adult Kidney Transplant ProgramScientific Registry for Transplant Recipients [facility]) Data**www.srtr.orgTransplant OutcomesFOR TRANSPLANTS PERFORMED BETWEEN March 15, 2016 & March 12, 2020Release date july 6, 2021SRTR performs assessments of transplant outcomes every 6 months by looking at the outcomes of patients who underwent transplant over 2.5 years. The table below shows assessments of the data from the transplant program at [facility] vs. the [place]. Likelihood that your kidney transplant is successful at 1-year (ADULT) ALL DONORS EVALUATED FROM LIVING DONORS FROM DECEASED DONORS Percentage Alive with a functioning transplant at 1-year - [place](Deaths and  Re-transplants are consideredgraft failures) 93.99% 96.06% 92.34% Percentage Alive with a functioning transplant at 1-year - [place](Deaths and Re-transplants are consideredgraft failures) 95.68% 98.09% 94.58% Likelihood of living at 1-year after kidney transplant  (ADULT) ALL DONORS EVALUATED FROM LIVING DONORS FROM DECEASED DONORS 1-year Patient Survival [facility] (Re-transplants excluded) 97.18% 97.80% 96.67% 1-year Patient Survival [place] (Re-transplants excluded)                                          97.62% 99.09% 96.93% Nursing Plan: The patient has completed a re-assessment with the transplant team, transplant specific education has been provided to the patient. The patient verbalizes an understanding of the plan of care at this time. Required testing has been reviewed with the patient.  Patient has been instructed to contact the wait list team with any intercurrent events.  Patient verbalized the importance of updating the transplant team with any changes to dialysis, contact information, or health status. Patient also informed that monthly red top blood sample is required in order to maintain active listing.  Further Testing and Clearances Needed:  Per guidelines and protocols, the patient will require the following:INCOMPLETE KIDNEY EVALUATION TASKSEcho scheduled for 10/17/2019

## 2019-10-08 NOTE — Progress Notes
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     REVIEW OF TRANSPLANT EVALUATION      Adam Harper presents today for Repeat kidney evaluation testing. He is a 59 y.o. 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 He is on dialysis. He receives hemodialysis in center. He makes no urine at baseline.    INTERVAL HISTORY:  Adam Harper comes in for re-evaluation after having completed his parathyroidectomy for tertiary hyperparathyroidism done by Dr Calendar. He underwent resection of 3.5 PTH glands with a residual half of right inferior PTH gland in-situ. He is doing well otherwise. He is able to walk 4000 steps on flat ground with his wife without any issues and able to climb 20 steps up without any chest pain or dyspnea. He does feel limited by muscle pain and fatigue. He uses his CPAP routinely for CPAP.  He continues to be compliant with his dialysis treatments and has been doing well without issues.    RELEVANT CARDIOVASCULAR HISTORY:  At this tome he does not report and concerning symptoms for CAD. No dyspnea, claudication or chest pain reported. He has minima edema that improves after HD.   Cardiac cath (04/04/2018) shows no significant coronary disease   Last echo from 2018 shows concentric LVH, most recent cardiac cath also shows increased LVEDP   ???  RELEVANT ONCOLOGIC HISTORY:  No history of cancer.  History of smoking but does not meet criteria for screening.   Last colonoscopy 08/10/2018 done with 3 polyps removed (one tubular and 2 hyperplastic polyps) - next due in 3 years  No history of skin cancer.  ???  OTHER:  Has significant OSA and Mallampati class 4. Reports using CPAP daily. He has tolerated prior GA and intubation without issue.     REVIEW OF SYSTEMS     General: no generalized weakness, fatigue, fever or chills  Cardio: no chest pain, shortness of breath bipedal edema  Pulm: no cough, sputum production or wheezing  GI: no nausea, vomiting, diarrhea or constipation   GU: no dysuria or hematuria  Skin: no new rashes or skin lesions  Neuro: no focal weakness, tremors, speech disturbance    MEDICAL HISTORY     Past Medical History:   Diagnosis Date   ??? A-V fistula (HC Code)     right arm   ??? Anemia    ??? AV fistula infection (HC Code)    ??? Cirrhosis (HC Code)    ??? ESRD on hemodialysis (HC Code)     Mon-Wed-Fri   ??? GERD (gastroesophageal reflux disease)    ??? Hypercholesteremia    ??? Hyperparathyroidism (HC Code)    ??? Hypertension    ??? Influenza A    ??? Morbid obesity (HC Code) 02/03/2018   ??? Obstructive sleep apnea     pt uses CPAP   ??? Secondary hyperparathyroidism of renal origin (HC Code)    ??? Thoracic aortic aneurysm (HC Code)       Past Surgical History:   Procedure Laterality Date   ??? AV FISTULA PLACEMENT Left  left lower arm unsuccessful, then moved to upper arm 2011, then had problems in 10/2013 - infection      No family history on file.   Social History     Tobacco Use   ??? Smoking status: Former Smoker     Packs/day: 0.25     Years: 25.00     Pack years: 6.25     Types: Cigarettes   ??? Smokeless tobacco: Never Used   Substance Use Topics   ??? Alcohol use: No        Past medical, surgical, family and social history reviewed. No changes at this time.    MEDICATIONS AND ALLERGIES     Outpatient Encounter Medications as of 10/02/2019   Medication Sig   ??? amLODIPine (NORVASC) 10 mg tablet Take 1 tablet (10 mg total) by mouth every morning.   ??? atorvastatin (LIPITOR) 80 mg tablet Take 1 tablet (80 mg total) by mouth daily.   ??? calcitrioL (ROCALTROL) 0.5 MCG capsule Take 2 capsules (1 mcg total) by mouth 2 times daily (0900, 1700).   ??? calcium acetate,phosphat bind, (PHOSLO) 667 mg tablet Take 3 tabs with each meal and 2 tabs with each snack, total 11 per day (Patient not taking: Reported on 10/02/2019)   ??? calcium carbonate (TUMS ULTRA) 1000 mg (400 mg calcium) chewable tablet Take 4 tablets (4,000 mg total) by mouth every 6 (six) hours.   ??? carvediloL (COREG) 25 mg Immediate Release tablet TAKE 1 TABLET(25 MG) BY MOUTH TWICE DAILY WITH BREAKFAST AND DINNER   ??? DARBepoetin alfa-polysorbate (ARANESP) 60 mcg/0.3 mL syringe Inject 0.3 mLs (60 mcg total) under the skin every 7 days. In dialysis   ??? diclofenac (VOLTAREN) 1 % gel Apply topically 4 (four) times daily. (Patient not taking: Reported on 10/02/2019)   ??? epoetin alfa (EPOGEN INJ) Inject 3,200 Units under the skin Every Monday, Wednesday, and Friday.   ??? iron sucrose complex (VENOFER IV) Inject 50 mg into the vein Every Monday.   ??? lisinopriL (PRINIVIL,ZESTRIL) 40 mg tablet Take 1 tablet (40 mg total) by mouth daily.   ??? LORazepam (ATIVAN) 1 mg tablet Take 1 tablet (1 mg total) by mouth once as needed for anxiety (take 30 minutes prior to MRI for anxiety) for up to 1 dose.   ??? omeprazole (PRILOSEC) 20 mg capsule Take 1 capsule (20 mg total) by mouth daily.   ??? oxyCODONE (ROXICODONE) 5 mg Immediate Release tablet Take 1 tablet (5 mg total) by mouth every 4 (four) hours as needed. (Patient not taking: Reported on 10/02/2019)   ??? triamcinolone (KENALOG) 0.1 % cream Apply topically 2 (two) times daily. (Patient not taking: Reported on 10/02/2019)     PHYSICAL EXAMINATION     BP (!) 149/71 (Site: r a, Position: Sitting, Cuff Size: Large)  - Pulse 80  - Temp 98 ???F (36.7 ???C)  - Ht 5' 0.75\ (1.543 m)  - Wt 119.3 kg  - BMI 50.08 kg/m???   Wt Readings from Last 4 Encounters:   10/02/19 119.3 kg   10/02/19 119.3 kg   10/02/19 117.9 kg   07/06/19 113.2 kg     Gen: not in acute distress  HEENT: atraumatic, anicteric   CV: regular rate and rhythm, distinct S1 and S2, no murmurs appreciated   Resp: clear breath sounds bilaterally, no wheezing or crackles appreciated  Abd: non-distended, normoactive bowel sounds, non-tender to palpation  Ext: no pedal edema, femoral pulses ++ bilaterally  Neuro: Awake, alert and oriented, no  gross motor deficits     PERTINENT LABS AND IMAGING     Lab Results   Component Value Date    WBC 6.0 02/21/2019    HGB 10.6 (L) 02/21/2019    HCT 32.9 (L) 02/21/2019    PLT 195 02/21/2019    GLU 123 (H) 02/21/2019    BUN 63 (H) 02/21/2019    CREATININE 17.05 (H) 02/21/2019    CO2 23 02/21/2019    CL 94 (L) 02/21/2019    NA 136 02/21/2019    K 5.5 (H) 02/21/2019    CALCIUM 8.4 (L) 02/21/2019    EGFR 8 06/21/2012    PRCRRAU 2.7 (H) 06/10/2010    PHOS 7.1 (H) 02/21/2019    PTH 219.0 (H) 10/03/2018    VITAMIND25HY 26 11/12/2009    HGBA1C 5.5 10/03/2018    IRON 118 07/24/2010    TIBC 356 07/24/2010    FERRITIN 279 07/24/2010       ASSESSMENT AND PLAN     Adam Harper is a 59 year old male with ESRD secondary to FSGS currently on iHD in center at [facility] unit to MWF schedule since 2012 who comes in for re-evaluation after he was inactivated on the transplant list due to 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 He is now s/p subtotal parathyroidectomy with improvement and hypocalcemia that is well controlled on supplementation.     During this visit the risks and benefits of kidney transplantation were discussed. In addition, the various treatment options for managing end stage renal disease was discussed with the patient. This included Kidney transplantation, hemodialysis/peritoneal dialysis and medical management or conservative care.     After discussion with these factors, the patient has decided to proceed with kidney transplantation.     Re-activation will be discussed at [facility] meeting.

## 2019-10-17 ENCOUNTER — Encounter: Admit: 2019-10-17 | Payer: PRIVATE HEALTH INSURANCE | Primary: Family Medicine

## 2019-10-17 ENCOUNTER — Ambulatory Visit: Admit: 2019-10-17 | Payer: PRIVATE HEALTH INSURANCE | Primary: Family Medicine

## 2019-10-18 ENCOUNTER — Encounter: Admit: 2019-10-18 | Payer: PRIVATE HEALTH INSURANCE | Primary: Family Medicine

## 2019-10-18 ENCOUNTER — Telehealth: Admit: 2019-10-18 | Payer: PRIVATE HEALTH INSURANCE | Primary: Family Medicine

## 2019-10-18 NOTE — Telephone Encounter
Patient was called today to follow-up on ECHO result the patient was scheduled to have on 10/17/2019. Patient was aware of the test and was reminded on 09/2019. No answer on both Home phone and mobile pone numbers. Left voicemail message and call back number. Will try to call patient again.

## 2019-10-19 ENCOUNTER — Telehealth: Admit: 2019-10-19 | Payer: PRIVATE HEALTH INSURANCE | Primary: Family Medicine

## 2019-10-19 NOTE — Telephone Encounter
Patient was called at 8:30 am today to follow-up if he had his echo done. Patient verbalized that he will go to do ECHO today.

## 2019-10-22 ENCOUNTER — Telehealth: Admit: 2019-10-22 | Payer: PRIVATE HEALTH INSURANCE | Primary: Family Medicine

## 2019-10-22 NOTE — Telephone Encounter
Patient is re-scheduled for ECHO on 11/26/2019 at 10:40 am. Patient aware and informed to arrive 15 min before schedule. Patient verbalized he will go to the appointment.

## 2019-10-29 ENCOUNTER — Telehealth: Admit: 2019-10-29 | Payer: PRIVATE HEALTH INSURANCE | Primary: Family Medicine

## 2019-10-29 NOTE — Telephone Encounter
Patient was called to inform him that his echo was scheduled at an earlier time, 10/30/2019 at 8:10 am at [facility], 2nd floor. Patient verbalized understanding that echo needs to be done so he his status can be made Active again in the Kidney transplant Wait list.

## 2019-10-30 ENCOUNTER — Inpatient Hospital Stay: Admit: 2019-10-30 | Discharge: 2019-10-30 | Payer: PRIVATE HEALTH INSURANCE | Primary: Family Medicine

## 2019-10-30 DIAGNOSIS — N186 End stage renal disease: Secondary | ICD-10-CM

## 2019-10-30 DIAGNOSIS — I10 Essential (primary) hypertension: Secondary | ICD-10-CM

## 2019-10-31 ENCOUNTER — Encounter: Admit: 2019-10-31 | Payer: PRIVATE HEALTH INSURANCE | Attending: Speech-Language Pathologist | Primary: Family Medicine

## 2019-10-31 ENCOUNTER — Encounter: Admit: 2019-10-31 | Payer: PRIVATE HEALTH INSURANCE | Primary: Family Medicine

## 2019-10-31 NOTE — Progress Notes
[facility] Trans[facility] CenterKidney Transplant Program- Routine Waitlist Case ReviewREFERRING PROVIDER: Ether Harper PRIMARY CARE PROVIDER: Julen, Adam Harper , Adam Harper AGE:  59 y.o. 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 Hyperparathyroidism (HC Code) HYPERPARATHYROIDISM ??? Anemia in ESRD (end-stage renal disease) (HC Code) ANEMIA IN END STAGE RENAL DISEASE ??? ROD (renal osteodystrophy) RENAL OSTEODYSTROPHY ??? Sepsis (HC Code) SEPSIS ??? Infected prosthetic vascular graft, initial encounter (HC Code) VASCULAR GRAFT INFECTION ??? Hypertension HYPERTENSIVE DISORDER ??? Hypertriglyceridemia HYPERTRIGLYCERIDEMIA ??? Hyperkalemia HYPERKALEMIA ??? Complication of arteriovenous dialysis fistula, subsequent encounter DISORDER OF SURGICAL ARTERIOVENOUS FISTULA ??? Obese OBESITY ??? Anemia of chronic renal failure ANEMIA OF CHRONIC RENAL FAILURE ??? 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 Colon polyps POLYP OF COLON ??? Internal hemorrhoids INTERNAL HEMORRHOIDS ??? Mediastinal mass MEDIASTINAL MASS ??? Arteriovenous fistula stenosis, subsequent encounter ARTERIOVENOUS FISTULA STENOSIS ??? AVF (arteriovenous fistula) (HC Code) ARTERIOVENOUS FISTULA ??? Anemia in ESRD (end-stage renal disease) (HC Code) ANEMIA IN END STAGE RENAL DISEASE ??? Alkaline phosphatase raised ALKALINE PHOSPHATASE RAISED ??? Decreased testosterone level DECREASED TESTOSTERONE LEVEL ??? Gastroesophageal reflux disease GASTROESOPHAGEAL REFLUX DISEASE ??? Hypocalcemia HYPOCALCEMIA ??? 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 Newborn affected by abnormality in fetal (intrauterine) heart rate or rhythm, unspecified as to time of onset SUSPECTED CLINICAL FINDING Adam Harper???is a 59 y.o.???year old male???with a PMHx of ESRD???due to FSGS, he has been on dialysis since 07/23/2010.  Past medical history of mild nonobstructive CAD,???HTN,???HLD,???OSA, and thoracic aortic aneurysm.??? In 12/2014, he had a pharm SPECT which showed a small, mild area of scar in the basal to mid inferolateral wall. [place] performed for attenuation correction showed 3v coronary calcifications and a dilated mid ascending aorta measuring???4.1 cm. In 03/2018, he underwent cardiac catheterization for prekidney transplant listing eval. It showed mild diffuse disease with an anomalous anterosuperior takeoff of the RCA and an elevated LVEDP of 40 mmHg. A subsequent [place] in 05/2018 showed stable aortic dilation. ???TTE (10/2018)* Mildly increased left ventricular cavity size. ???Normal left ventricular systolic function. ???Severe concentric left ventricular hypertrophy. ???LVEF calculated by 3DE was 68%.* Normal right ventricular cavity size and systolic function. ???Right ventricular systolic pressure is unable to be estimated due to insufficient Doppler signal.* Left atrium is moderately dilated.* Mild aortic valve thickening. ???No aortic stenosis. ???Trace aortic regurgitation.* Dilated sinuses of Valsalva with a diameter of 4.4 cm and dilated ascending aorta with a diameter of 4.2 cm.* No evidence of pericardial effusion.* Compared to the study from 08/24/16, the left ventricular hypertrophy is now severe.???Cath (03/2018)Left main: mild diseaseLAD: mild diseaseLCx: mild diseaseRCA: Dominant vessel. ???Anomalous takeoff anterior superior (AL2 catheter) LV: not injectedLVEDP 40 mmHg???Abdominal ultrasound (05/2018):No evidence for abdominal aortic aneurysm.???[place] scan (05/2018):1. ???Unchanged dilated ascending aorta to 4.1 cm.2. ???Severe three-vessel coronary arterial calcification.???Cleared in high risk cardiac clinic by Dr. Gabriela Eves in June 2020. A repeat echocardiogram was again performed in preparation for reactivation:  * Normal left ventricular size, systolic function and wall motion. Moderate concentric left ventricular hypertrophy.  LVEF calculated by biplane Simpson's was 57%.  Abnormal tissue Doppler suggestive of abnormal diastolic function.* Normal right ventricular cavity size and systolic function.  Right ventricular systolic pressure is unable to  be estimated due to insufficient Doppler signal.* Atria are normal in size.* No significant valvular abnormalities.* All visible segments of the aorta are normal in size.* IVC diameter < 2.1 cm that collapses > 50% with a sniff suggests normal RAP (0-5 mmHg, mean 3 mmHg).* No evidence of pericardial effusion.* Compared with the prior study, dated 11/10/2018, the measurements of the left ventricular wall thickness, the left atrium, and the ascending aorta were previously abnormal. 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  He has had dysarthia for many decades per medical records.  ???Repetitive nerve stimulation was normal per report. ???Patient was referred to thoracic surgery,???Dr.???Detterbeck,???who did not think that patient's mediastinal abnormality was a thymoma but rather likely a benign cyst,???and stable over the last 4 years comparing repeat imaging.???Myasthenia Gravis Clinic Summary:Adam Harper is a 59 y.o. 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 ???Repeat antibody level was decreased and just slightly above the reference range cut off. ???Suspect that this might be a false-positive and could be due to patient's end-stage renal disease. ???My suspicion for autoimmune myasthenia gravis is low at this time. I recommend that we follow patient's clinical course over time and will schedule a follow-up visit with me in approximately 6 months. My overall impression and recommendations were reviewed with patient today.???I did Harper with patient typical signs and symptoms of myasthenia gravis,???in ask that patient contact us should any of these symptoms emerge or with any specific questions. ???All questions answered at time of our visit. ???Today's visit was conducted with interpreter services??????Plan of Care at this time: - [place] to be discussed by team - was seen by anesthesia on 02/20/2019.  Has been intubated in past and recently had general anesthesia with a laryngeal mask airway without issue per documentation.  Prior anesthesia notes indicated large tongue and redundant tissue noted.  Glidescope, Blade Size 4 used.  - Patient has an enlarged tongue noted as well on exam.  - Patient completed a cardiac cath in January 2020.- Repeat echocardiogram performed - Patient has improved CPAP compliance - Patient DOES NOT have a history of cirrhosis, it was erroneously entered in the medical record and has been removed.  Case has been reviewed by the team, RTT is current cPRA: 0 Avoid DGF per Dr. Lonia Mad Patient is aware that he should expect to see organ offers quickly, support team remains intact per patient report.  The above patient is currently on the UNOS waitlist.  A chart Harper has been performed today as part of routine waitlist re-assessment.

## 2019-10-31 NOTE — Progress Notes
[facility] Transplantation CenterKidney Transplant ProgramWaitlist Status Change Patient Summary: REFERRING PROVIDER: [facility] CARE PROVIDER: Leta Speller:  Adam Harper , [id number] AGE:  60 y.o. 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 as calculated from the following:  Height as of 10/02/19: 5' 9 (1.753 m).  Weight as of 10/02/19: 119.3 kg.   LIVING DONOR: No Transplant Plan of Care:  The above patient has been discussed by the transplant team and at this time the decision has been made to have the patient be reactivated. .  A letter noting the status change has been sent to the patient and their primary care team including physician and dialysis unit.

## 2019-11-02 ENCOUNTER — Ambulatory Visit: Admit: 2019-11-02 | Payer: PRIVATE HEALTH INSURANCE | Attending: Cardiovascular Disease | Primary: Family Medicine

## 2019-11-04 ENCOUNTER — Inpatient Hospital Stay: Admit: 2019-11-04 | Discharge: 2019-11-10 | Payer: PRIVATE HEALTH INSURANCE | Source: Ambulatory Visit

## 2019-11-04 ENCOUNTER — Encounter: Admit: 2019-11-04 | Payer: PRIVATE HEALTH INSURANCE | Primary: Family Medicine

## 2019-11-04 ENCOUNTER — Telehealth: Admit: 2019-11-04 | Payer: PRIVATE HEALTH INSURANCE | Primary: Family Medicine

## 2019-11-05 ENCOUNTER — Encounter: Admit: 2019-11-05 | Payer: PRIVATE HEALTH INSURANCE | Primary: Family Medicine

## 2019-11-05 ENCOUNTER — Inpatient Hospital Stay: Admit: 2019-11-05 | Payer: PRIVATE HEALTH INSURANCE

## 2019-11-05 ENCOUNTER — Encounter
Admit: 2019-11-05 | Payer: PRIVATE HEALTH INSURANCE | Attending: Vascular and Interventional Radiology | Primary: Family Medicine

## 2019-11-05 ENCOUNTER — Inpatient Hospital Stay
Admit: 2019-11-05 | Payer: PRIVATE HEALTH INSURANCE | Attending: Student in an Organized Health Care Education/Training Program

## 2019-11-05 ENCOUNTER — Inpatient Hospital Stay: Admit: 2019-11-05 | Discharge: 2019-11-05 | Payer: PRIVATE HEALTH INSURANCE | Primary: Family Medicine

## 2019-11-05 DIAGNOSIS — G4733 Obstructive sleep apnea (adult) (pediatric): Secondary | ICD-10-CM

## 2019-11-05 DIAGNOSIS — N051 Unspecified nephritic syndrome with focal and segmental glomerular lesions: Secondary | ICD-10-CM

## 2019-11-05 DIAGNOSIS — K219 Gastro-esophageal reflux disease without esophagitis: Secondary | ICD-10-CM

## 2019-11-05 DIAGNOSIS — I712 Thoracic aortic aneurysm (HC Code): Secondary | ICD-10-CM

## 2019-11-05 DIAGNOSIS — E7800 Pure hypercholesterolemia, unspecified: Secondary | ICD-10-CM

## 2019-11-05 DIAGNOSIS — K7460 Unspecified cirrhosis of liver: Secondary | ICD-10-CM

## 2019-11-05 DIAGNOSIS — I10 Essential (primary) hypertension: Secondary | ICD-10-CM

## 2019-11-05 DIAGNOSIS — D649 Anemia, unspecified: Secondary | ICD-10-CM

## 2019-11-05 DIAGNOSIS — T827XXA Infection and inflammatory reaction due to other cardiac and vascular devices, implants and grafts, initial encounter: Secondary | ICD-10-CM

## 2019-11-05 DIAGNOSIS — N186 End stage renal disease: Secondary | ICD-10-CM

## 2019-11-05 DIAGNOSIS — N2581 Secondary hyperparathyroidism of renal origin: Secondary | ICD-10-CM

## 2019-11-05 DIAGNOSIS — I770 Arteriovenous fistula, acquired: Secondary | ICD-10-CM

## 2019-11-05 DIAGNOSIS — J101 Influenza due to other identified influenza virus with other respiratory manifestations: Secondary | ICD-10-CM

## 2019-11-05 DIAGNOSIS — E213 Hyperparathyroidism, unspecified: Secondary | ICD-10-CM

## 2019-11-05 LAB — COMPREHENSIVE METABOLIC PANEL
BKR A/G RATIO: 1.4 g/dL — ABNORMAL LOW (ref 1.0–2.2)
BKR ALANINE AMINOTRANSFERASE (ALT): 18 U/L (ref 9–59)
BKR ALBUMIN: 4.2 g/dL (ref 3.6–4.9)
BKR ALKALINE PHOSPHATASE: 67 U/L (ref 9–122)
BKR ANION GAP: 20 g/dL — ABNORMAL HIGH (ref 7–17)
BKR ASPARTATE AMINOTRANSFERASE (AST): 17 U/L (ref 10–35)
BKR AST/ALT RATIO: 0.9
BKR BILIRUBIN TOTAL: 0.4 mg/dL (ref <=1.2)
BKR BLOOD UREA NITROGEN: 60 mg/dL — ABNORMAL HIGH (ref 6–20)
BKR BUN / CREAT RATIO: 3.5 — ABNORMAL LOW (ref 8.0–23.0)
BKR CALCIUM: 11.3 mg/dL — ABNORMAL HIGH (ref 8.8–10.2)
BKR CHLORIDE: 94 mmol/L — ABNORMAL LOW (ref 98–107)
BKR CREATININE: 17.07 mg/dL — ABNORMAL HIGH (ref 0.40–1.30)
BKR EGFR (AFR AMER): 4 mL/min/{1.73_m2} (ref >60)
BKR EGFR (NON AFRICAN AMERICAN): 3 mL/min/{1.73_m2} (ref >60)
BKR GLOBULIN: 2.9 g/dL (ref 2.3–3.5)
BKR GLUCOSE: 104 mg/dL — ABNORMAL HIGH (ref 70–100)
BKR MAGNESIUM: 18 U/L — ABNORMAL LOW (ref 9–59)
BKR POTASSIUM: 4.8 mmol/L (ref 3.3–5.1)
BKR PROTEIN TOTAL: 7.1 g/dL (ref 6.6–8.7)
BKR SODIUM: 140 mmol/L (ref 136–144)

## 2019-11-05 LAB — CBC WITH AUTO DIFFERENTIAL
BKR CALCIUM: 89.1 % — ABNORMAL HIGH (ref 37.0–84.0)
BKR CO2: 0 x 1000/??L (ref 0.0–0.0)
BKR CO2: 2.9 M/??L — ABNORMAL LOW (ref 3.8–5.9)
BKR EGFR (NON AFRICAN AMERICAN): 3.2 % — ABNORMAL LOW (ref 4.0–15.0)
BKR WAM ABSOLUTE IMMATURE GRANULOCYTES: 0 x 1000/ÂµL (ref 0.0–0.3)
BKR WAM ABSOLUTE IMMATURE GRANULOCYTES: 0.1 x 1000/??L (ref 0.0–0.3)
BKR WAM ABSOLUTE LYMPHOCYTE COUNT: 1.3 x 1000/ÂµL (ref 1.0–4.0)
BKR WAM ABSOLUTE LYMPHOCYTE COUNT: 0.6 x 1000/ÂµL — ABNORMAL LOW (ref 1.0–4.0)
BKR WAM ABSOLUTE NRBC: 0 x 1000/ÂµL (ref 0.0–0.0)
BKR WAM ABSOLUTE NRBC: 0 x 1000/??L (ref 0.0–0.0)
BKR WAM ANALYZER ANC: 2.9 x 1000/??L (ref 1.0–11.0)
BKR WAM ANALYZER ANC: 8.4 x 1000/ÂµL (ref 1.0–11.0)
BKR WAM BASOPHIL ABSOLUTE COUNT: 0 x 1000/ÂµL (ref 0.0–0.0)
BKR WAM BASOPHIL ABSOLUTE COUNT: 0 x 1000/??L (ref 0.0–0.0)
BKR WAM BASOPHILS: 0.1 % (ref 0.0–4.0)
BKR WAM EOSINOPHIL ABSOLUTE COUNT: 0.3 x 1000/??L — ABNORMAL HIGH (ref 0.0–1.0)
BKR WAM EOSINOPHIL ABSOLUTE COUNT: 0 x 1000/??L (ref 0.0–1.0)
BKR WAM EOSINOPHILS: 5.1 % (ref 0.0–7.0)
BKR WAM EOSINOPHILS: 0.2 % (ref 0.0–7.0)
BKR WAM HEMATOCRIT: 30 % — ABNORMAL LOW (ref 37.0–52.0)
BKR WAM HEMATOCRIT: 31.4 % — ABNORMAL LOW (ref 37.0–52.0)
BKR WAM HEMOGLOBIN: 10.2 g/dL — ABNORMAL LOW (ref 12.0–18.0)
BKR WAM HEMOGLOBIN: 10.7 g/dL — ABNORMAL LOW (ref 12.0–18.0)
BKR WAM IMMATURE GRANULOCYTES: 0.4 % (ref 0.0–3.0)
BKR WAM IMMATURE GRANULOCYTES: 0.6 % (ref 0.0–3.0)
BKR WAM LYMPHOCYTES: 26.1 % (ref 8.0–49.0)
BKR WAM LYMPHOCYTES: 6.8 % — ABNORMAL LOW (ref 8.0–49.0)
BKR WAM MCH (PG): 0 x 1000/??L — ABNORMAL HIGH (ref 0.0–0.3)
BKR WAM MCH (PG): 35.8 pg — ABNORMAL HIGH (ref 27.0–31.0)
BKR WAM MCH (PG): 35.7 pg — ABNORMAL HIGH (ref 27.0–31.0)
BKR WAM MCHC: 34 g/dL — ABNORMAL HIGH (ref 31.0–36.0)
BKR WAM MCHC: 34.1 g/dL (ref 31.0–36.0)
BKR WAM MCV: 105.3 fL — ABNORMAL HIGH (ref 78.0–94.0)
BKR WAM MCV: 104.7 fL — ABNORMAL HIGH (ref 78.0–94.0)
BKR WAM MONOCYTE ABSOLUTE COUNT: 0.5 x 1000/ÂµL (ref 0.0–2.0)
BKR WAM MONOCYTE ABSOLUTE COUNT: 0.3 x 1000/??L (ref 0.0–2.0)
BKR WAM MONOCYTES: 10.1 % (ref 4.0–15.0)
BKR WAM MONOCYTES: 3.2 % — ABNORMAL LOW (ref 4.0–15.0)
BKR WAM MPV: 9.9 fL (ref 6.0–11.0)
BKR WAM MPV: 9.7 fL (ref 6.0–11.0)
BKR WAM NEUTROPHILS: 57.7 % (ref 37.0–84.0)
BKR WAM NEUTROPHILS: 89.1 % — ABNORMAL HIGH (ref 37.0–84.0)
BKR WAM NUCLEATED RED BLOOD CELLS: 0 % (ref 0.0–1.0)
BKR WAM NUCLEATED RED BLOOD CELLS: 0 % (ref 0.0–1.0)
BKR WAM PLATELETS: 219 x1000/??L — ABNORMAL LOW (ref 140–440)
BKR WAM PLATELETS: 221 x1000/??L (ref 140–440)
BKR WAM RDW-CV: 13.6 % — ABNORMAL HIGH (ref 11.5–14.5)
BKR WAM RDW-CV: 13.9 % (ref 11.5–14.5)
BKR WAM RED BLOOD CELL COUNT: 2.9 M/ÂµL — ABNORMAL LOW (ref 3.8–5.9)
BKR WAM RED BLOOD CELL COUNT: 3 M/??L — ABNORMAL LOW (ref 3.8–5.9)
BKR WAM WHITE BLOOD CELL COUNT: 5 x1000/??L — ABNORMAL LOW (ref 4.0–10.0)
BKR WAM WHITE BLOOD CELL COUNT: 9.4 x1000/??L (ref 4.0–10.0)

## 2019-11-05 LAB — HEMOGLOBIN A1C
BKR ESTIMATED AVERAGE GLUCOSE: 117 mg/dL
BKR HEMOGLOBIN A1C: 5.7 % — ABNORMAL HIGH (ref 4.0–5.6)

## 2019-11-05 LAB — BASIC METABOLIC PANEL
BKR ANION GAP: 18 % — ABNORMAL HIGH (ref 7–17)
BKR BLOOD UREA NITROGEN: 31 mg/dL — ABNORMAL HIGH (ref 6–20)
BKR BUN / CREAT RATIO: 2.8 — ABNORMAL LOW (ref 8.0–23.0)
BKR CHLORIDE: 96 mmol/L — ABNORMAL LOW (ref 98–107)
BKR CO2: 21 mmol/L (ref 20–30)
BKR CREATININE: 11.2 mg/dL — ABNORMAL HIGH (ref 0.40–1.30)
BKR EGFR (AFR AMER): 6 mL/min/1.73m2 — ABNORMAL LOW (ref >60)
BKR GLUCOSE: 125 mg/dL — ABNORMAL HIGH (ref 70–100)
BKR POTASSIUM: 4.4 mmol/L (ref 3.3–5.1)
BKR SODIUM: 135 mmol/L — ABNORMAL LOW (ref 136–144)

## 2019-11-05 LAB — PT/INR AND PTT (BH GH L LMW YH)
BKR INR: 1.06 (ref 0.88–1.15)
BKR PARTIAL THROMBOPLASTIN TIME: 26 seconds (ref 23.9–29.9)
BKR PROTHROMBIN TIME: 11.4 s (ref 9.6–12.3)

## 2019-11-05 LAB — HEPATITIS B CORE ANTIBODY, TOTAL: BKR HEPATITIS B CORE TOTAL ANTIBODY: NEGATIVE x 1000/??L — ABNORMAL HIGH (ref 0.0–0.0)

## 2019-11-05 LAB — PHOSPHORUS     (BH GH L LMW YH)
BKR PHOSPHORUS: 8.4 mg/dL — ABNORMAL HIGH (ref 2.2–4.5)
BKR PHOSPHORUS: 4.2 mg/dL (ref 2.2–4.5)

## 2019-11-05 LAB — COVID-19 CLEARANCE OR FOR PLACEMENT ONLY: BKR SARS-COV-2 RNA (COVID-19) (YH): NEGATIVE

## 2019-11-05 LAB — HEPATITIS B SURFACE ANTIGEN     (BH GH L LMW YH): BKR HEPATITIS B SURFACE ANTIGEN: NEGATIVE mL/min/{1.73_m2} (ref 0.0–2.0)

## 2019-11-05 LAB — HEPATITIS B SURFACE ANTIBODY     (BH GH L LMW YH): BKR HEP B SURFACE AB INITIAL RESULT: 199.48 m[IU]/mL (ref >=12)

## 2019-11-05 LAB — MAGNESIUM
BKR MAGNESIUM: 2.2 mg/dL (ref 1.7–2.4)
BKR MAGNESIUM: 1.8 mg/dL (ref 1.7–2.4)

## 2019-11-05 LAB — HEPATITIS C AB WITH REFLEX TO HCV PCR: BKR HEPATITIS C ANTIBODY: NEGATIVE

## 2019-11-05 LAB — HIV-1/HIV-2 ANTIBODY/ANTIGEN SCREEN W/REFLEX     (BH GH LMW YH): BKR HIV 1 AND 2 ANTIBODY/HIV-1 ANTIGEN SCREEN: NEGATIVE

## 2019-11-05 MED ORDER — HEPARIN 5000 UNITS IN SODIUM CHLORIDE 0.9% 500ML (MIXTURE)
Status: DC | PRN
Start: 2019-11-05 — End: 2019-11-05
  Administered 2019-11-05: 18:00:00 501.000 mL

## 2019-11-05 MED ORDER — OXYCODONE IMMEDIATE RELEASE 5 MG TABLET
5 mg | Freq: Once | ORAL | Status: CP
Start: 2019-11-05 — End: ?
  Administered 2019-11-06: 04:00:00 5 mg via ORAL

## 2019-11-05 MED ORDER — SODIUM CHLORIDE 0.45 % INTRAVENOUS SOLUTION
0.45 % | INTRAVENOUS | Status: DC | PRN
Start: 2019-11-05 — End: 2019-11-06
  Administered 2019-11-05 – 2019-11-06 (×11): 0.45 mL/h via INTRAVENOUS

## 2019-11-05 MED ORDER — HYDROMORPHONE 0.5 MG/0.5 ML INJECTION SYRINGE
0.5 mg/ mL | Status: CP
Start: 2019-11-05 — End: ?

## 2019-11-05 MED ORDER — ONDANSETRON HCL (PF) 4 MG/2 ML INJECTION SOLUTION
42 mg/2 mL | INTRAVENOUS | Status: DC | PRN
Start: 2019-11-05 — End: 2019-11-05
  Administered 2019-11-05: 21:00:00 4 mg/2 mL via INTRAVENOUS

## 2019-11-05 MED ORDER — OXYCODONE IMMEDIATE RELEASE 5 MG TABLET
5 mg | Status: CP
Start: 2019-11-05 — End: ?

## 2019-11-05 MED ORDER — METHYLPREDNISOLONE IVPB (> 125 MG BUT < 250 MG)
INTRAVENOUS | Status: CP
Start: 2019-11-05 — End: ?
  Administered 2019-11-07: 14:00:00 50.000 mL/h via INTRAVENOUS

## 2019-11-05 MED ORDER — SUGAMMADEX 100 MG/ML INTRAVENOUS SOLUTION
100 mg/mL | INTRAVENOUS | Status: DC | PRN
Start: 2019-11-05 — End: 2019-11-05
  Administered 2019-11-05: 21:00:00 100 mg/mL via INTRAVENOUS

## 2019-11-05 MED ORDER — PHENYLEPHRINE 1 MG/10 ML (100 MCG/ML) IN 0.9 % SOD.CHLORIDE IV SYRINGE
110100 mg/0 mL (00 mcg/mL) | INTRAVENOUS | Status: DC | PRN
Start: 2019-11-05 — End: 2019-11-05
  Administered 2019-11-05 (×2): 1 mg/0 mL (00 mcg/mL) via INTRAVENOUS

## 2019-11-05 MED ORDER — METHYLPREDNISOLONE SOD SUCC (PF) 125 MG/2 ML SOLUTION FOR INJECTION
1252 mg/2 mL | INTRAVENOUS | Status: CP
Start: 2019-11-05 — End: ?
  Administered 2019-11-08: 12:00:00 125 mL via INTRAVENOUS

## 2019-11-05 MED ORDER — FUROSEMIDE 10 MG/ML INJECTION SOLUTION
10 mg/mL | INTRAVENOUS | Status: DC | PRN
Start: 2019-11-05 — End: 2019-11-05
  Administered 2019-11-05: 19:00:00 10 mg/mL via INTRAVENOUS

## 2019-11-05 MED ORDER — FENTANYL (PF) 50 MCG/ML INJECTION SOLUTION
50 mcg/mL | INTRAVENOUS | Status: DC | PRN
Start: 2019-11-05 — End: 2019-11-06

## 2019-11-05 MED ORDER — SODIUM CHLORIDE 0.9 % (FLUSH) INJECTION SYRINGE
0.9 % | INTRAVENOUS | Status: DC | PRN
Start: 2019-11-05 — End: 2019-11-06

## 2019-11-05 MED ORDER — ONDANSETRON HCL (PF) 4 MG/2 ML INJECTION SOLUTION
42 mg/2 mL | Freq: Four times a day (QID) | INTRAVENOUS | Status: DC | PRN
Start: 2019-11-05 — End: 2019-11-11

## 2019-11-05 MED ORDER — ONDANSETRON 4 MG DISINTEGRATING TABLET
4 mg | Freq: Four times a day (QID) | ORAL | Status: DC | PRN
Start: 2019-11-05 — End: 2019-11-11

## 2019-11-05 MED ORDER — METHYLENE BLUE (ANTIDOTE) 5 MG/ML INTRAVENOUS SOLUTION
5 mg/mL | Status: DC | PRN
Start: 2019-11-05 — End: 2019-11-05
  Administered 2019-11-05: 18:00:00 5 mg/mL via INTRAVESICAL

## 2019-11-05 MED ORDER — MANNITOL 25 % INTRAVENOUS SOLUTION
25 % | INTRAVENOUS | Status: DC | PRN
Start: 2019-11-05 — End: 2019-11-05
  Administered 2019-11-05: 19:00:00 25 % via INTRAVENOUS

## 2019-11-05 MED ORDER — OXYCODONE IMMEDIATE RELEASE 5 MG TABLET
5 mg | Freq: Once | ORAL | Status: CP | PRN
Start: 2019-11-05 — End: ?
  Administered 2019-11-06: 5 mg via ORAL

## 2019-11-05 MED ORDER — METHYLPREDNISOLONE SOD SUCC (PF) 125 MG/2 ML SOLUTION FOR INJECTION
1252 mg/2 mL | INTRAVENOUS | Status: CP
Start: 2019-11-05 — End: ?
  Administered 2019-11-09: 13:00:00 125 mL via INTRAVENOUS

## 2019-11-05 MED ORDER — FENTANYL (PF) 50 MCG/ML INJECTION SOLUTION
50 mcg/mL | Status: CP
Start: 2019-11-05 — End: ?

## 2019-11-05 MED ORDER — FENTANYL (PF) 50 MCG/ML INJECTION SOLUTION
50 mcg/mL | INTRAVENOUS | Status: DC | PRN
Start: 2019-11-05 — End: 2019-11-05
  Administered 2019-11-05 (×5): 50 mcg/mL via INTRAVENOUS

## 2019-11-05 MED ORDER — SODIUM CHLORIDE 0.9 % (FLUSH) INJECTION SYRINGE
0.9 % | Freq: Three times a day (TID) | INTRAVENOUS | Status: DC
Start: 2019-11-05 — End: 2019-11-06

## 2019-11-05 MED ORDER — SODIUM CHLORIDE 0.9 % IRRIGATION SOLUTION
0.9 % irrigation | Status: CP | PRN
Start: 2019-11-05 — End: ?
  Administered 2019-11-05: 18:00:00 0.9 % irrigation

## 2019-11-05 MED ORDER — LACTATED RINGERS INTRAVENOUS SOLUTION
INTRAVENOUS | Status: DC | PRN
Start: 2019-11-05 — End: 2019-11-05
  Administered 2019-11-05: 17:00:00 via INTRAVENOUS

## 2019-11-05 MED ORDER — ACETAMINOPHEN 1,000 MG/100 ML (10 MG/ML) INTRAVENOUS SOLUTION
10 mg/mL | INTRAVENOUS | Status: DC | PRN
Start: 2019-11-05 — End: 2019-11-05
  Administered 2019-11-05: 20:00:00 10 mg/mL via INTRAVENOUS

## 2019-11-05 MED ORDER — CEFAZOLIN 1 GRAM SOLUTION FOR INJECTION
1 gram | INTRAVENOUS | Status: DC | PRN
Start: 2019-11-05 — End: 2019-11-05
  Administered 2019-11-05 (×2): 1 gram via INTRAVENOUS

## 2019-11-05 MED ORDER — FENTANYL (PF) 50 MCG/ML INJECTION SOLUTION
50 mcg/mL | INTRAVENOUS | Status: DC | PRN
Start: 2019-11-05 — End: 2019-11-06
  Administered 2019-11-05: 22:00:00 50 mL via INTRAVENOUS

## 2019-11-05 MED ORDER — ACETAMINOPHEN 325 MG TABLET
325 mg | Freq: Four times a day (QID) | ORAL | Status: DC
Start: 2019-11-05 — End: 2019-11-11
  Administered 2019-11-06 – 2019-11-10 (×18): 325 mg via ORAL

## 2019-11-05 MED ORDER — METHYLPREDNISOLONE IVPB (> 125 MG BUT < 250 MG)
INTRAVENOUS | Status: CP
Start: 2019-11-05 — End: ?
  Administered 2019-11-06: 12:00:00 50.000 mL/h via INTRAVENOUS

## 2019-11-05 MED ORDER — FAMOTIDINE 20 MG TABLET
20 mg | Freq: Every day | ORAL | Status: DC
Start: 2019-11-05 — End: 2019-11-06
  Administered 2019-11-06: 12:00:00 20 mg via ORAL

## 2019-11-05 MED ORDER — HYDROMORPHONE 0.5 MG/0.5 ML INJECTION SYRINGE
0.5 mg/ mL | INTRAVENOUS | Status: DC | PRN
Start: 2019-11-05 — End: 2019-11-06

## 2019-11-05 MED ORDER — HYDROMORPHONE 0.5 MG/0.5 ML INJECTION SYRINGE
0.5 mg/ mL | INTRAVENOUS | Status: DC | PRN
Start: 2019-11-05 — End: 2019-11-06
  Administered 2019-11-05: 23:00:00 0.5 mL via INTRAVENOUS

## 2019-11-05 MED ORDER — LACTATED RINGERS INTRAVENOUS SOLUTION
INTRAVENOUS | Status: DC
Start: 2019-11-05 — End: 2019-11-06

## 2019-11-05 MED ORDER — PROPOFOL 10 MG/ML INTRAVENOUS EMULSION
10 mg/mL | Status: CP
Start: 2019-11-05 — End: ?

## 2019-11-05 MED ORDER — MIDAZOLAM (PF) 1 MG/ML INJECTION SOLUTION
1 mg/mL | Status: CP
Start: 2019-11-05 — End: ?

## 2019-11-05 MED ORDER — MYCOPHENOLATE MOFETIL 250 MG CAPSULE
250 mg | Freq: Two times a day (BID) | ORAL | Status: DC
Start: 2019-11-05 — End: 2019-11-11
  Administered 2019-11-06 – 2019-11-10 (×9): 250 mg via ORAL

## 2019-11-05 MED ORDER — PREDNISONE 10 MG TABLET
10 mg | Freq: Every day | ORAL | Status: DC
Start: 2019-11-05 — End: 2019-11-11
  Administered 2019-11-10: 14:00:00 10 mg via ORAL

## 2019-11-05 MED ORDER — ALEMTUZUMAB 30 MG/ML INTRAVENOUS SOLUTION (MFG. SUPPLIED)
30 mg/mL | SUBCUTANEOUS | Status: DC | PRN
Start: 2019-11-05 — End: 2019-11-05
  Administered 2019-11-05: 17:00:00 30 mg/mL via SUBCUTANEOUS

## 2019-11-05 MED ORDER — NALOXONE 0.4 MG/ML INJECTION SOLUTION
0.4 mg/mL | INTRAVENOUS | Status: DC | PRN
Start: 2019-11-05 — End: 2019-11-06

## 2019-11-05 MED ORDER — FLUCONAZOLE 100 MG TABLET
100 mg | Freq: Every day | ORAL | Status: DC
Start: 2019-11-05 — End: 2019-11-11
  Administered 2019-11-06 – 2019-11-10 (×5): 100 mg via ORAL

## 2019-11-05 MED ORDER — ONDANSETRON HCL (PF) 4 MG/2 ML INJECTION SOLUTION
4 mg/2 mL | INTRAVENOUS | Status: DC | PRN
Start: 2019-11-05 — End: 2019-11-06

## 2019-11-05 MED ORDER — MEPERIDINE 25 MG/2.5 ML IN 0.9% SODIUM CHLORIDE
INTRAVENOUS | Status: DC | PRN
Start: 2019-11-05 — End: 2019-11-06

## 2019-11-05 MED ORDER — DIMENHYDRINATE 5 MG/ML IN 0.9% SODIUM CHLORIDE
INTRAVENOUS | Status: DC | PRN
Start: 2019-11-05 — End: 2019-11-06

## 2019-11-05 MED ORDER — TRAMADOL 50 MG TABLET
50 mg | Freq: Two times a day (BID) | ORAL | Status: DC | PRN
Start: 2019-11-05 — End: 2019-11-06

## 2019-11-05 MED ORDER — HEPARIN (PORCINE) 5,000 UNIT/ML INJECTION SOLUTION
5000 unit/mL | Freq: Three times a day (TID) | SUBCUTANEOUS | Status: DC
Start: 2019-11-05 — End: 2019-11-11
  Administered 2019-11-06 – 2019-11-10 (×15): via SUBCUTANEOUS

## 2019-11-05 MED ORDER — METHYLPREDNISOLONE SODIUM SUCCINATE 1,000 MG INTRAVENOUS SOLUTION
1000 mg | INTRAVENOUS | Status: DC | PRN
Start: 2019-11-05 — End: 2019-11-05
  Administered 2019-11-05: 17:00:00 via INTRAVENOUS

## 2019-11-05 MED ORDER — PROPOFOL 10 MG/ML INTRAVENOUS EMULSION
10 mg/mL | INTRAVENOUS | Status: DC | PRN
Start: 2019-11-05 — End: 2019-11-05
  Administered 2019-11-05 (×2): 10 mg/mL via INTRAVENOUS

## 2019-11-05 MED ORDER — ROCURONIUM 10 MG/ML INTRAVENOUS SOLUTION
10 mg/mL | INTRAVENOUS | Status: DC | PRN
Start: 2019-11-05 — End: 2019-11-05
  Administered 2019-11-05 (×3): 10 mg/mL via INTRAVENOUS

## 2019-11-05 MED ORDER — LIDOCAINE (PF) 20 MG/ML (2 %) INJECTION SOLUTION
202 mg/mL (2 %) | INTRAVENOUS | Status: DC | PRN
Start: 2019-11-05 — End: 2019-11-05
  Administered 2019-11-05: 17:00:00 20 mg/mL (2 %) via INTRAVENOUS

## 2019-11-05 MED ORDER — TRAMADOL 50 MG TABLET
50 mg | Freq: Two times a day (BID) | ORAL | Status: DC | PRN
Start: 2019-11-05 — End: 2019-11-06
  Administered 2019-11-06: 02:00:00 50 mg via ORAL

## 2019-11-05 NOTE — Progress Notes
Sent email to OPS with txp/rx benefits.

## 2019-11-05 NOTE — Anesthesia Pre-Procedure Evaluation
Review of Systems/ Medical HistoryPatient summary, EKG/Cardiac Studies , Labs and pre-procedure vitals, height, weight reviewed.No previous anesthesia concernsAnesthesia Evaluation: Estimated body mass index is 39.17 kg/m? as calculated from the following:  Height as of this encounter: 5' 9 (1.753 m).  Weight as of this encounter: 120.3 kg. CC/HPI: 59 yo male with history of HTN, HLD, ESRD on HD, OSA and obesity, now here for Deceased donor kidney transplant.Past Medical HistoryNo date: A-V fistula (HC Code)    Comment:  right armNo date: AnemiaNo date: AV fistula infection (HC Code)No date: Cirrhosis (HC Code)No date: ESRD on hemodialysis (HC Code)    Comment:  Mon-Wed-FriNo date: GERD (gastroesophageal reflux disease)No date: HypercholesteremiaNo date: Hyperparathyroidism (HC Code)No date: HypertensionNo date: Influenza A11/22/2019: Morbid obesity (HC Code)No date: Obstructive sleep apnea    Comment:  pt uses CPAPNo date: Secondary hyperparathyroidism of renal origin (HC Code)No date: Thoracic aortic aneurysm [facility] Code)Past Surgical History:  Past Surgical History:No date: AV FISTULA PLACEMENT; Left    Comment:  left lower arm unsuccessful, then moved to upper arm              2011, then had problems in 10/2013 - infectionCardiovascular:Patient has a history of: hypercholesterolemia and hypertension. -Exercise tolerance: >4 METS -Coronary Artery Disease: CAD -Other Cardiovascular:  Echo 8/20:* Mildly increased left ventricular cavity size.  Normal left ventricular systolic function.  Severe concentric left ventricular hypertrophy.  LVEF calculated by 3DE was 68%.* Normal right ventricular cavity size and systolic function.  Right ventricular systolic pressure is unable to be estimated due to insufficient Doppler signal.* Left atrium is moderately dilated.* Mild aortic valve thickening.  No aortic stenosis.  Trace aortic regurgitation.* Dilated sinuses of Valsalva with a diameter of 4.4 cm and dilated ascending aorta with a diameter of 4.2 cm.* No evidence of pericardial effusion.* Compared to the study from 08/24/16, the left ventricular hypertrophy is now severe.1/20: cath: Coronary arteries have very large caliber. ?Left main: mild diseaseLAD: mild diseaseLCx: mild diseaseRCA: Dominant vessel.  Anomalous takeoff anterior superior (AL2 catheter) LV: not injectedLVEDP 40 mmHg. Respiratory: -Obstructive Sleep apnea:   yesHEENT: Negative.Neuromuscular: NegativeSkeletal/Skin:  NegativeGastrointestinal/Genitourinary: -Gastrointestinal Disorders:  Patient has GERD. His GERD is well controlled.-Hepatic Disorders:  Patient has liver disease and cirrhosis.-Nutritional Disorders: Patient has has increased body weightBehavioral/Psychiatric & Syndromes: NegativePhysical ExamCardiovascular:    normal exam  Pulmonary:  normal exam  Airway:  Mallampati: IIITM distance: <3 FBNeck ROM: fullDental:  normal exam  Anesthesia PlanASA 3 The primary anesthesia plan is  general ETT. Perioperative Code Status confirmed: It is my understanding that the patient is currently designated as 'Full Code' and will remain so throughout the perioperative period.Anesthesia informed consent obtained. Use of blood products: consented  Plan discussed with CRNA.Anesthesiologist's Pre Op NoteI personally evaluated and examined the patient prior to the intra-operative phase of care.

## 2019-11-05 NOTE — Plan of Care
Admission Note Nursing Adam Harper is a 59 y.o. male admitted with a chief complaint of pre op kidney. Patient arrived from homePatient is    Vitals:  11/05/19 0014 BP: (!) 142/84 Pulse: 72 Resp: 20 Temp: 98.5 ?F (36.9 ?C) TempSrc: Oral SpO2: 95% Weight: 120.3 kg Height: 5' 9 (1.753 m) Oxygen therapy Oxygen TherapySpO2: 95 %Device (Oxygen Therapy): room airI have reviewed the patient's current medication orders..Comments: Pt A&Ox4. Spanish speaking, speaks good [place]. VSS on RA. No pain reported. Anuric, (HD patient via left AVF Left limb restriction) LBM yesterday. 2-RN skin check, skin intact. Covid sent, EKG completed, Labs sent, pt down to chest x-ray. Safety precautions in place. See flowsheets, patient education and plan of care for additional information. ~1610 Transport at bedside, pt off to HD Plan of Care Overview/ Patient Status

## 2019-11-05 NOTE — Anesthesia Post-Procedure Evaluation
Anesthesia Post-op NotePatient: Adam A SantiagoProcedure(s):  Procedure(s) (LRB):RENAL ALLOTRANSPLANTATION, GRAFT IMPLANT; W/O RECIPIENT NEPHRECTOMY (Left)BACKBENCH CADAVER OR LIVING DONOR RENAL ALLOGRAFT RECONSTRUCT PRIOR TO TRANSPLANT; VENOUS ANAST, EA (Left) Patient location: PACULast Vitals:  I have noted the vital signs as listed in the nursing notes.Mental status recovered: patient participates in evaluation: YesVital signs reviewed: YesRespiratory function stable:YesAirway is patent: YesCardiovascular function and hydration status stable: YesPain control satisfactory: YesNausea and vomiting control satisfactory:Yes

## 2019-11-05 NOTE — Progress Notes
Pt received a cadaveric kidney txp 11/05/19, admitted 11/04/19.BCMCR active QMB-active. Auth for listing has been obtained

## 2019-11-05 NOTE — Progress Notes
Donor ABO:       Recipient ABO:

## 2019-11-05 NOTE — H&P
[facility] HealthSurgery History & PhysicalAttending Provider: Dr Louanne Belton Oyama is a 59 y.o. male with a PMH  of FSGS, ESRD on hemodialysis, GERD, hypercholesteremia, OSA who presented to the hospital overnight to be admitted for kidney transplant in the OR today. He states that he was initially diagnosed with renal disease about 10 years ago and was subsequently placed on hemodialysis soon after. Pt states that he makes very minimal amount of urine. He denies any fevers, chills, chest pain, SOB, headaches, extremity numbness and tingling. He has not experienced any recent weight lossPMH Anemia, ESRD on hemodialysis, focal segmental glomerulosclerosis, GERD , Hypercholesteremia, Hyperparathyroidism, Hypertension,  Morbid obesity, Obstructive sleep apnea, Secondary hyperparathyroidism of renal origin, and Thoracic aortic aneurysm .PSH AV Fistula PlacementFH HTN in father, brother and sisterNo family history of kidney diseaseSocial History: Denies smoking. Quit about 3 years agoDenies drinking any alcoholAllergies: NKDAHome Meds: No current facility-administered medications on file prior to encounter. Current Outpatient Medications on File Prior to Encounter Medication Sig Dispense Refill ? amLODIPine (NORVASC) 10 mg tablet Take 1 tablet (10 mg total) by mouth every morning. 90 tablet 3 ? atorvastatin (LIPITOR) 80 mg tablet Take 1 tablet (80 mg total) by mouth daily. 90 tablet 3 ? calcitrioL (ROCALTROL) 0.5 MCG capsule Take 2 capsules (1 mcg total) by mouth 2 times daily (0900, 1700). 120 capsule 3 ? calcium acetate,phosphat bind, (PHOSLO) 667 mg tablet Take 3 tabs with each meal and 2 tabs with each snack, total 11 per day (Patient not taking: Reported on 10/02/2019) 330 tablet 11 ? calcium carbonate (TUMS ULTRA) 1000 mg (400 mg calcium) chewable tablet Take 4 tablets (4,000 mg total) by mouth every 6 (six) hours. 360 tablet 3 ? carvediloL (COREG) 25 mg Immediate Release tablet TAKE 1 TABLET(25 MG) BY MOUTH TWICE DAILY WITH BREAKFAST AND DINNER 90 tablet 2 ? DARBepoetin alfa-polysorbate (ARANESP) 60 mcg/0.3 mL syringe Inject 0.3 mLs (60 mcg total) under the skin every 7 days. In dialysis 4 Syringe 2 ? diclofenac (VOLTAREN) 1 % gel Apply topically 4 (four) times daily. (Patient not taking: Reported on 10/02/2019) 100 g 2 ? epoetin alfa (EPOGEN INJ) Inject 3,200 Units under the skin Every Monday, Wednesday, and Friday.   ? iron sucrose complex (VENOFER IV) Inject 50 mg into the vein Every Monday.   ? lisinopriL (PRINIVIL,ZESTRIL) 40 mg tablet Take 1 tablet (40 mg total) by mouth daily. 90 tablet 3 ? LORazepam (ATIVAN) 1 mg tablet Take 1 tablet (1 mg total) by mouth once as needed for anxiety (take 30 minutes prior to MRI for anxiety) for up to 1 dose. 1 tablet 0 ? omeprazole (PRILOSEC) 20 mg capsule Take 1 capsule (20 mg total) by mouth daily. 90 capsule 1 ? oxyCODONE (ROXICODONE) 5 mg Immediate Release tablet Take 1 tablet (5 mg total) by mouth every 4 (four) hours as needed. (Patient not taking: Reported on 10/02/2019) 8 tablet 0 ? triamcinolone (KENALOG) 0.1 % cream Apply topically 2 (two) times daily. (Patient not taking: Reported on 10/02/2019) 30 g 1 ROSConstitutional: negative for loss of appetite, chills, fatigue, and feversEyes: negative for blurring, double vision, and eye painEars, Nose, Mouth, Throat and Face: negative for facial trauma, hearing loss, and difficulty swallowing Respiratory: negative for coughCardiac: negative for chest pain, palpitations, and shortness of breathGastrointestinal: negative for abdominal pain, change in bowel hablits, nausea, and vomitingGenitourinary: makes minimal amount of urineSkin/breast: negative for rash Hematologic/lymphatic: negative for abnormal bruising, bleeding, and enlarged lymph nodesNeurological: negative for  headaches, tingling sensation, and weaknessVITALSTemp:  [98.5 ?F (36.9 ?C)] 98.5 ?F (36.9 ?C)Pulse:  [72] 72Resp:  [20] 20BP: (142)/(84) 142/84SpO2:  [95 %] 95 %Device (Oxygen Therapy): room airEXAMGen: NAD, sitting up in chairCV: Regular rate and rhythmPulm: Non-labored breathing on RA, mild rhonci bil Abd: soft, distended, non-TTPExt: WWP, no edema,  +1 DP and post tib pulses bil, 2+ femoral pulses bil, AV fistula in RUE with palpable thrillNeuro: Spontaneously moving all four extremities LABSRecent Labs   08/23/210055 WBC 5.0 HGB 10.2* HCT 30.0* PLT 219 MCV 105.3* MONOCYTES 10.1  Recent Labs   08/23/210055 NA 140 K 4.8 CL 94* CO2 26 BUN 60* CREATININE 17.07* GLU 104* ANIONGAP 20* CALCIUM 11.3* MG 2.2 PHOS 8.4* Recent Labs   08/23/210055 LABPROT 11.4 PTT 26.0 INR 1.06 Recent Labs   08/23/210055 BILITOT 0.4 AST 17 ALT 18 ALKPHOS 67 ALBUMIN 4.2 Recent Labs   08/23/210055 GLU 104* IMAGINGX-Ray Chest PA and LateralResult Date: 8/23/2021No acute radiographic finding. Reported And Signed By: Beverly Gust, MD  ASSESSMENT/PLANLuis A Zapanta is a 59 y.o. male with a history of  of FSGS, ESRD on hemodialysis, GERD, hypercholesteremia, OSA who presented to the hospital overnight to be admitted for kidney transplant in the OR todayHD this amNPO for ORPre- Op labsOR for kidney transplant todayRest of care pending OR courseSigned:Oluwaseun Lawana Chambers, MDGeneral Surgery8/23/2021 ATTESTATIONI examined and interviewed the patient on 11/05/2019.  I have reviewed the provider's note and edited it where appropriate.  I agree with all elements of the note.  BREN STEERS is a 59 y.o. 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  The patient appears to be fit to proceed with renal transplantation.  We will proceed with renal transplantation once the allograft has been inspected on the back-table.   KEANTE URIZAR is an 59 y.o. [place] woman with _end stage renal disease / chronic renal insufficiency_ secondary to _.  The patient is _an acceptable challenging but acceptable not an acceptable candidate for renal transplantation from a surgical perspective.  The patient does require further assessment of the iliac arterial vasculature with a non-contrast [place].  There is no history concerning for urological difficulties post-transplant.  We had an extensive discussion regarding kidney transplantation using a spanish language interpreter.   We discussed the surgical procedure and the potential for post-operative complications, including; allograft rejection, bleeding, infection, delayed graft function, primary non-function.  We discussed the potential negative effects of immunosuppression, including; diarrhea, nausea, tremor, hair loss, as well as an increase risk for infections and cancers.  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  Written consent was obtained using a spanish language interpreter. Porfirio Oar, MDTransplant Eilene Ghazi, MDTransplant Surgery

## 2019-11-05 NOTE — Plan of Care
Plan of Care Overview/ Patient Status    Acute Dialysis Nursing Treatment Note Dialysate Bath :  2K 2.5 CaAccess :  Right arm AVFDuration of Treatment :  3.5 hoursNet Volume Ultrafiltrated :  4 LBlood pressure trends : 139-200 SBPMedications administered :  NoneComments :  On arrival to HD unit, requested to sit up in bed and requested 2L O2 for comfort, no episode of desaturation.  Per standing weight, pt is 5 L above EDW.  Net UF=4 L to leave pt 1 L above EDW prior to transplant surgery, tolerated without issue.  No issue with AVF, BFR=400.   Pt on room air when he left 8-5 to go to OR.Jerilynn Som, RN

## 2019-11-05 NOTE — Other
Post Anesthesia Transfer of Care NotePatient: Adam A SantiagoProcedure(s) Performed: Procedure(s) (LRB):RENAL ALLOTRANSPLANTATION, GRAFT IMPLANT; W/O RECIPIENT NEPHRECTOMY (Left)BACKBENCH CADAVER OR LIVING DONOR RENAL ALLOGRAFT RECONSTRUCT PRIOR TO TRANSPLANT; VENOUS ANAST, EA (Left) Patient location: PACU Last Vitals: Vitals Value Taken Time BP 172/92 11/05/19 1751 Temp 36.6 ?C 11/05/19 1751 Pulse 82 11/05/19 1752 Resp 18 11/05/19 1752 SpO2 93 % 11/05/19 1752 Vitals shown include unvalidated device data.Level of consciousness: awakeTransport Vital Signs:  Stable since the last set of recorded intra-operative vital signsComplications: noneIntra-operative Intake & Output and Antibiotics as per Anesthesia record and discussed with the RN.

## 2019-11-05 NOTE — Other
Spanish speaking follows command. VSS in 3L NC.  Decrease O2 sat while sleep intermittently Sat improved with deep breathing.  C/O pain fentanyl and dilaudid given with positive outcome.  US performed. Labs were sent.  0.45% NS initiated.  Low U/O upon arrival.  Right lower abdominal  incision with scant moist drainage, JP with 25 ml sanguineous drainage. Handoff given to Marylu Lund, Charity fundraiser .  Will continue to monitoring U/O.

## 2019-11-05 NOTE — Progress Notes
TXP/RX BenefitsPer RTE active [facility] MCR ZO#[id number] eff 04/16/2019 to present.No copay for days 1-60Ded.-$1484.XBJ-$4782NFA RTE active QMB [id number] Rx: Theda Sers Rx [phone] s/w Peyton Najjar policy active eff 04/16/19 to presentID#[id number]#[id number] RxPCN#IS RxGRP#[id number] has LIS Generic: $1.30Brand: $4.00Valganciclovir covered $1.30 copay. No PA req.[employer] will req B vs D determination, [phone] in network

## 2019-11-05 NOTE — Other
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

## 2019-11-05 NOTE — Progress Notes
Provisional kdy txp per oncall sigh out-11/04/19.BCMCR-ActiveQMB-Active. Auith for listing has been obtained, if admitted need inpt auth from [facility].

## 2019-11-05 NOTE — Utilization Review (ED)
UM Status: Commercial - IP

## 2019-11-05 NOTE — Progress Notes
Pt received a cadaveric kidney trasplant 11/05/19. Pending Auth. [id number], clinicals have been requested to be sent.

## 2019-11-05 NOTE — Telephone Encounter
[facility] Transplantation CenterInitial Evaluation Date: 06/10/2010 Name:  AUDREY ELLER , [id number]  Age:  59 y.o.  Gender:  male  Etiology:  Focal Glomerular Sclerosis (Focal Segmental - FSG)  ABO:  O  BMI:  Estimated body mass index is 38.84 kg/m? as calculated from the following:  Height as of 10/02/19: 5' 9 (1.753 m).  Weight as of 10/02/19: 119.3 kg. The above patient called to discuss potential deceased organ offer Donor ID: [id number]. Patient states they are feeling well, no signs and symptoms of infection reported. I discussed organ donor's past medical history and the current known quality of organ with Denvil. Discussed the possibility that the transplant can be cancelled at any time due to recipient or donor medical issues. Jaquae verbalized acceptance of potential organ transplant. Advised Anay not eat or drink anything at this time and proceed to [facility] for admission. Admitting, 9W charge nurse, and transplant team notified of potential transplant. Patient verbalized understanding of instructions and intent to comply with the plan of care. Electronically Signed by Tami Ribas, RN, November 04, 2019

## 2019-11-05 NOTE — Brief Op Note
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] Procedure(s) and Anesthesia Type:   * RENAL ALLOTRANSPLANTATION, GRAFT IMPLANT; W/O RECIPIENT NEPHRECTOMY - GENERAL   * BACKBENCH CADAVER OR LIVING DONOR RENAL ALLOGRAFT RECONSTRUCT PRIOR TO TRANSPLANT; VENOUS ANAST, EA - GENERALOperative Findings (enter relevant operative findings; do not refer to an operative report that is not yet transcribed): DDKT Left kidney to right iliac fossa 1 artery, 1 vein, 1 ureter stented Signs of infection present at the time of surgery at the operative site: None Blood and Blood Products: none                 Drains:  JP to RIF Foley Implants: Implant Name Type Inv. Item Serial No. Manufacturer Lot No. LRB No. Used Action STENT DOUBLE-J URETHERAL 6X12 GYRUS - [id number] Implant STENT DOUBLE-J URETHERAL 6X12 GYRUS  OLYMPUS AMER [id number]  1 Implanted  Specimens: * No specimens in log * EBL: 250 mL1.8L crystalloid        Post Operative Diagnosis: * No post-op diagnosis entered Tommas Olp, MD8/23/20215:28 PM

## 2019-11-06 ENCOUNTER — Encounter: Admit: 2019-11-06 | Payer: PRIVATE HEALTH INSURANCE | Primary: Family Medicine

## 2019-11-06 LAB — CBC WITH AUTO DIFFERENTIAL
BKR WAM ABSOLUTE IMMATURE GRANULOCYTES: 0.1 x 1000/??L (ref 0.0–0.3)
BKR WAM ABSOLUTE LYMPHOCYTE COUNT: 0.2 x 1000/ÂµL — ABNORMAL LOW (ref 1.0–4.0)
BKR WAM ABSOLUTE NRBC: 0 x 1000/??L (ref 0.0–0.0)
BKR WAM ANALYZER ANC: 10.8 x 1000/??L — ABNORMAL HIGH (ref 1.0–11.0)
BKR WAM BASOPHIL ABSOLUTE COUNT: 0 x 1000/??L (ref 0.0–0.0)
BKR WAM BASOPHILS: 0.1 % (ref 0.0–4.0)
BKR WAM EOSINOPHIL ABSOLUTE COUNT: 0 x 1000/??L (ref 0.0–1.0)
BKR WAM EOSINOPHILS: 0 % (ref 0.0–7.0)
BKR WAM HEMATOCRIT: 33.9 % — ABNORMAL LOW (ref 37.0–52.0)
BKR WAM HEMOGLOBIN: 11.5 g/dL — ABNORMAL LOW (ref 12.0–18.0)
BKR WAM IMMATURE GRANULOCYTES: 0.8 % (ref 0.0–3.0)
BKR WAM LYMPHOCYTES: 1.6 % — ABNORMAL LOW (ref 8.0–49.0)
BKR WAM MCH (PG): 35.8 pg — ABNORMAL HIGH (ref 27.0–31.0)
BKR WAM MCHC: 33.9 g/dL (ref 31.0–36.0)
BKR WAM MCV: 105.6 fL — ABNORMAL HIGH (ref 78.0–94.0)
BKR WAM MONOCYTE ABSOLUTE COUNT: 0.5 x 1000/??L (ref 0.0–2.0)
BKR WAM MPV: 9.8 fL (ref 6.0–11.0)
BKR WAM NEUTROPHILS: 93.3 % — ABNORMAL HIGH (ref 37.0–84.0)
BKR WAM NUCLEATED RED BLOOD CELLS: 0 % (ref 0.0–1.0)
BKR WAM PLATELETS: 225 x1000/ÂµL (ref 140–440)
BKR WAM RDW-CV: 13.9 % — ABNORMAL HIGH (ref 11.5–14.5)
BKR WAM RED BLOOD CELL COUNT: 3.2 M/??L — ABNORMAL LOW (ref 3.8–5.9)
BKR WAM WHITE BLOOD CELL COUNT: 11.6 x1000/??L — ABNORMAL HIGH (ref 4.0–10.0)

## 2019-11-06 LAB — PHOSPHORUS     (BH GH L LMW YH): BKR PHOSPHORUS: 4.2 mg/dL (ref 2.2–4.5)

## 2019-11-06 LAB — BASIC METABOLIC PANEL
BKR ANION GAP: 19 — ABNORMAL HIGH (ref 7–17)
BKR ANION GAP: 21 — ABNORMAL HIGH (ref 7–17)
BKR ANION GAP: 21 — ABNORMAL HIGH (ref 7–17)
BKR BLOOD UREA NITROGEN: 45 mg/dL — ABNORMAL HIGH (ref 6–20)
BKR BLOOD UREA NITROGEN: 49 mg/dL — ABNORMAL HIGH (ref 6–20)
BKR BLOOD UREA NITROGEN: 54 mg/dL — ABNORMAL HIGH (ref 6–20)
BKR BUN / CREAT RATIO: 3.6 % — ABNORMAL LOW (ref 8.0–23.0)
BKR BUN / CREAT RATIO: 3.8 — ABNORMAL LOW (ref 8.0–23.0)
BKR BUN / CREAT RATIO: 4 — ABNORMAL LOW (ref 8.0–23.0)
BKR CALCIUM: 9.2 mg/dL — ABNORMAL HIGH (ref 8.8–10.2)
BKR CALCIUM: 9.6 mg/dL (ref 8.8–10.2)
BKR CALCIUM: 9.9 mg/dL (ref 8.8–10.2)
BKR CHLORIDE: 93 mmol/L — ABNORMAL LOW (ref 98–107)
BKR CHLORIDE: 93 mmol/L — ABNORMAL LOW (ref 98–107)
BKR CHLORIDE: 94 mmol/L — ABNORMAL LOW (ref 98–107)
BKR CO2: 18 mmol/L — ABNORMAL LOW (ref 20–30)
BKR CO2: 18 mmol/L — ABNORMAL LOW (ref 20–30)
BKR CO2: 19 mmol/L — ABNORMAL LOW (ref 20–30)
BKR CREATININE: 12.55 mg/dL — ABNORMAL HIGH (ref 0.40–1.30)
BKR CREATININE: 13.02 mg/dL — ABNORMAL HIGH (ref 0.40–1.30)
BKR CREATININE: 13.49 mg/dL — ABNORMAL HIGH (ref 0.40–1.30)
BKR EGFR (AFR AMER): 5 mL/min/{1.73_m2} (ref >60)
BKR EGFR (AFR AMER): 5 mL/min/{1.73_m2} (ref >60)
BKR EGFR (AFR AMER): 5 mL/min/{1.73_m2} — ABNORMAL LOW (ref >60)
BKR EGFR (NON AFRICAN AMERICAN): 4 mL/min/{1.73_m2} (ref >60)
BKR EGFR (NON AFRICAN AMERICAN): 4 mL/min/{1.73_m2} (ref >60)
BKR EGFR (NON AFRICAN AMERICAN): 4 mL/min/{1.73_m2} (ref >60)
BKR GLUCOSE: 124 mg/dL — ABNORMAL HIGH (ref 70–100)
BKR GLUCOSE: 136 mg/dL — ABNORMAL HIGH (ref 70–100)
BKR GLUCOSE: 138 mg/dL — ABNORMAL HIGH (ref 70–100)
BKR POTASSIUM: 5.4 mmol/L — ABNORMAL HIGH (ref 3.3–5.1)
BKR POTASSIUM: 5.5 mmol/L — ABNORMAL HIGH (ref 3.3–5.1)
BKR POTASSIUM: 5.5 mmol/L — ABNORMAL HIGH (ref 3.3–5.1)
BKR SODIUM: 132 mmol/L — ABNORMAL LOW (ref 136–144)
BKR SODIUM: 132 mmol/L — ABNORMAL LOW (ref 136–144)
BKR SODIUM: 132 mmol/L — ABNORMAL LOW (ref 136–144)
BKR WAM MONOCYTES: 4 mL/min/1.73m2 (ref >60)

## 2019-11-06 LAB — MAGNESIUM: BKR MAGNESIUM: 1.9 mg/dL (ref 1.7–2.4)

## 2019-11-06 LAB — TOXOPLASMA GONDII ANTIBODY, IGG: BKR TOXOPLASMA GONDII IGG: 0.2 (ref <=0.90)

## 2019-11-06 MED ORDER — PREDNISONE 20 MG TABLET
20 mg | ORAL_TABLET | Freq: Two times a day (BID) | ORAL | 1 refills | Status: AC
Start: 2019-11-06 — End: 2019-11-06

## 2019-11-06 MED ORDER — TACROLIMUS IMMEDIATE RELEASE 5 MG CAPSULE
5 mg | Freq: Two times a day (BID) | ORAL | Status: DC
Start: 2019-11-06 — End: 2019-11-08
  Administered 2019-11-06 – 2019-11-08 (×4): 5 mg via ORAL

## 2019-11-06 MED ORDER — CIPROFLOXACIN 500 MG TABLET
500 mg | ORAL_TABLET | Freq: Two times a day (BID) | ORAL | 1 refills | 2.00 days | Status: AC
Start: 2019-11-06 — End: 2019-11-26

## 2019-11-06 MED ORDER — FUROSEMIDE 10 MG/ML INJECTION SOLUTION
10 mg/mL | Freq: Once | INTRAVENOUS | Status: CP
Start: 2019-11-06 — End: ?
  Administered 2019-11-06: 12:00:00 10 mL via INTRAVENOUS

## 2019-11-06 MED ORDER — SULFAMETHOXAZOLE 400 MG-TRIMETHOPRIM 80 MG TABLET
400-80 mg | ORAL_TABLET | Freq: Every day | ORAL | 6 refills | Status: AC
Start: 2019-11-06 — End: 2019-11-06
  Filled 2019-11-10: qty 30, 30d supply, fill #0
  Filled 2019-11-10: qty 30, 30d supply, fill #1

## 2019-11-06 MED ORDER — FLUCONAZOLE 100 MG TABLET
100 mg | ORAL_TABLET | Freq: Every day | ORAL | 1 refills | Status: AC
Start: 2019-11-06 — End: 2019-11-06

## 2019-11-06 MED ORDER — TACROLIMUS IMMEDIATE RELEASE 1 MG CAPSULE
1 mg | ORAL_CAPSULE | Freq: Two times a day (BID) | ORAL | 12 refills | 30.00 days | Status: AC
Start: 2019-11-06 — End: 2019-11-10

## 2019-11-06 MED ORDER — POLYETHYLENE GLYCOL 3350 17 GRAM ORAL POWDER PACKET
17 gram | Freq: Every day | ORAL | Status: DC
Start: 2019-11-06 — End: 2019-11-11
  Administered 2019-11-06 – 2019-11-10 (×5): 17 gram via ORAL

## 2019-11-06 MED ORDER — FLUCONAZOLE 100 MG TABLET
100 mg | ORAL_TABLET | Freq: Every day | ORAL | 1 refills | 30.00 days | Status: AC
Start: 2019-11-06 — End: 2019-12-06
  Filled 2019-11-10: qty 30, 30d supply, fill #0
  Filled 2019-11-10: qty 30, 30d supply, fill #1

## 2019-11-06 MED ORDER — SODIUM CHLORIDE 0.9 % INTRAVENOUS SOLUTION
INTRAVENOUS | Status: DC
Start: 2019-11-06 — End: 2019-11-07
  Administered 2019-11-06 – 2019-11-07 (×2): via INTRAVENOUS

## 2019-11-06 MED ORDER — TACROLIMUS IMMEDIATE RELEASE 1 MG CAPSULE
1 mg | ORAL_CAPSULE | Freq: Two times a day (BID) | ORAL | 12 refills | Status: AC
Start: 2019-11-06 — End: 2019-11-06

## 2019-11-06 MED ORDER — SENNOSIDES 8.6 MG-DOCUSATE SODIUM 50 MG TABLET
Freq: Every evening | ORAL | Status: DC
Start: 2019-11-06 — End: 2019-11-11
  Administered 2019-11-07 – 2019-11-10 (×4): via ORAL

## 2019-11-06 MED ORDER — PREDNISONE 5 MG TABLET
5 mg | ORAL_TABLET | Freq: Every day | ORAL | 12 refills | 30.00 days | Status: AC
Start: 2019-11-06 — End: 2019-11-10

## 2019-11-06 MED ORDER — PREDNISONE 20 MG TABLET
20 mg | ORAL_TABLET | Freq: Two times a day (BID) | ORAL | 1 refills | 1.00 days | Status: AC
Start: 2019-11-06 — End: 2019-11-10

## 2019-11-06 MED ORDER — VALGANCICLOVIR 450 MG TABLET
450 mg | ORAL_TABLET | Freq: Every day | ORAL | 3 refills | 30.00 days | Status: AC
Start: 2019-11-06 — End: 2019-11-10

## 2019-11-06 MED ORDER — VALGANCICLOVIR 450 MG TABLET
450 mg | ORAL_TABLET | Freq: Every day | ORAL | 3 refills | Status: AC
Start: 2019-11-06 — End: 2019-11-06

## 2019-11-06 MED ORDER — TRAMADOL 50 MG TABLET
50 mg | Freq: Four times a day (QID) | ORAL | Status: DC | PRN
Start: 2019-11-06 — End: 2019-11-11
  Administered 2019-11-07: 13:00:00 50 mg via ORAL

## 2019-11-06 MED ORDER — MYCOPHENOLATE MOFETIL 250 MG CAPSULE
250 mg | ORAL_CAPSULE | Freq: Two times a day (BID) | ORAL | 12 refills | Status: AC
Start: 2019-11-06 — End: 2019-11-06
  Filled 2019-11-10: qty 240, 30d supply, fill #0
  Filled 2019-11-10: qty 240, 30d supply, fill #1

## 2019-11-06 MED ORDER — CIPROFLOXACIN 500 MG TABLET
500 mg | ORAL_TABLET | Freq: Two times a day (BID) | ORAL | 1 refills | Status: AC
Start: 2019-11-06 — End: 2019-11-06
  Filled 2019-11-10: qty 4, 2d supply, fill #0
  Filled 2019-11-10: qty 4, 2d supply, fill #1

## 2019-11-06 MED ORDER — TRAMADOL 50 MG TABLET
50 mg | Freq: Once | ORAL | Status: CP
Start: 2019-11-06 — End: ?
  Administered 2019-11-06: 08:00:00 50 mg via ORAL

## 2019-11-06 MED ORDER — OXYCODONE IMMEDIATE RELEASE 5 MG TABLET
5 mg | Freq: Once | ORAL | Status: CP
Start: 2019-11-06 — End: ?
  Administered 2019-11-06: 12:00:00 5 mg via ORAL

## 2019-11-06 MED ORDER — LIDOCAINE HCL 2 % MUCOSAL JELLY
2 % | Freq: Once | TOPICAL | Status: DC
Start: 2019-11-06 — End: 2019-11-07

## 2019-11-06 MED ORDER — SULFAMETHOXAZOLE 400 MG-TRIMETHOPRIM 80 MG TABLET
400-80 mg | ORAL_TABLET | Freq: Every day | ORAL | 6 refills | 30.00 days | Status: SS
Start: 2019-11-06 — End: 2019-11-20

## 2019-11-06 MED ORDER — PREDNISONE 5 MG TABLET
5 mg | ORAL_TABLET | Freq: Every day | ORAL | 12 refills | Status: AC
Start: 2019-11-06 — End: 2019-11-06

## 2019-11-06 MED ORDER — PANTOPRAZOLE 40 MG TABLET,DELAYED RELEASE
40 mg | Freq: Every day | ORAL | Status: DC
Start: 2019-11-06 — End: 2019-11-11
  Administered 2019-11-07 – 2019-11-10 (×4): 40 mg via ORAL

## 2019-11-06 MED ORDER — TRAMADOL 50 MG TABLET
50 mg | Freq: Four times a day (QID) | ORAL | Status: DC | PRN
Start: 2019-11-06 — End: 2019-11-06

## 2019-11-06 MED ORDER — FUROSEMIDE 10 MG/ML INJECTION SOLUTION
10 mg/mL | Freq: Once | INTRAVENOUS | Status: CP
Start: 2019-11-06 — End: ?
  Administered 2019-11-06: 19:00:00 10 mL via INTRAVENOUS

## 2019-11-06 MED ORDER — MYCOPHENOLATE MOFETIL 250 MG CAPSULE
250 mg | ORAL_CAPSULE | Freq: Two times a day (BID) | ORAL | 12 refills | 30.00 days | Status: SS
Start: 2019-11-06 — End: 2019-11-20

## 2019-11-06 MED ORDER — TRAMADOL 50 MG TABLET
50 mg | Freq: Four times a day (QID) | ORAL | Status: DC | PRN
Start: 2019-11-06 — End: 2019-11-11
  Administered 2019-11-06 – 2019-11-07 (×3): 50 mg via ORAL

## 2019-11-06 NOTE — Other
POST OP CHECK Adam Harper is a 59 y.o. male patient with a PMHx  of FSGS, ESRD on hemodialysis, GERD, hypercholesteremia, OSA who presented to the hospital to be admitted for kidney transplant in the OR today. He states that he was initially diagnosed with renal disease about 10 years ago and was subsequently placed on hemodialysis soon after. Pt states that he makes very minimal amount of urine.  POD#0 s/p DDKT Left Kidney RIF, 1v/1a/1u stented, campath, 3 day foley, JP, protocol 1:1, on CPAP.Principle Diagnosis: DDKT Left KidneyPast Medical History: Diagnosis Date ? A-V fistula (HC Code)   right arm ? Anemia  ? AV fistula infection (HC Code)  ? Cirrhosis (HC Code)  ? 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 GERD (gastroesophageal reflux disease)  ? Hypercholesteremia  ? Hyperparathyroidism (HC Code)  ? Hypertension  ? Influenza A  ? Morbid obesity (HC Code) 02/03/2018 ? Obstructive sleep apnea   pt uses CPAP ? Secondary hyperparathyroidism of renal origin (HC Code)  ? Thoracic aortic aneurysm (HC Code)  No past surgical history pertinent negatives on file.Subjective:Afebrile, no acute events- denies nausea, chest pain, dyspnea, palpitations- pain poorly controlled 7/10Objective:Temp:  [35.7 ?C-36.9 ?C] 36.2 ?CPulse:  [61-80] 79Resp:  [10-23] 16BP: (139-200)/(71-110) 150/84SpO2:  [95 %-99 %] 95 %Device (Oxygen Therapy): nasal cannulaO2 Flow (L/min):  [2-3] 3Intake/Output Summary (Last 24 hours) at 11/05/2019 2233Last data filed at 11/05/2019 2215Gross per 24 hour Intake 2600 ml Output 4745 ml Net -2145 ml Results in Past 7 DaysResult Component Current Result Hematocrit 31.4 (L) (11/05/2019) Hemoglobin 10.7 (L) (11/05/2019) MCH 35.7 (H) (11/05/2019) MCHC 34.1 (11/05/2019) MCV 104.7 (H) (11/05/2019) MPV 9.7 (11/05/2019) Platelets 221 (11/05/2019) RBC 3.0 (L) (11/05/2019) WBC 9.4 (11/05/2019) Lab Results Component Value Date  CREATININE 11.20 (H) 11/05/2019  BUN 31 (H) 11/05/2019  NA 135 (L) 11/05/2019  K 4.4 11/05/2019  CL 96 (L) 11/05/2019  CO2 21 11/05/2019 Physical Exam: Gen: awake, alert, NAD, resting comfortably in bedHeent: EOMI, trachea midline, neck flatLungs: CTABChest: RRR, no murmurs, rubs, or gallops auscultatedAbd: soft, attp, nondistended, incisions CDIGU: Foley in placeExt: WWP, moving all extremities, venodynes in place, no edema notedNeuro: AOX4A/P:Adam Harper is a 59 y.o. male patient POD#0 s/p  DDKT Left Kidney RIF, 1v/1a/1u stented, campath, 3 day foley, JP, protocol 1:1, on CPAP.Diet: CLDPain control: acetaminophen PO and Tramadol PO Monitor I/O Q4HMonitor vital signs Q4HDrains/tubes: JP drain (Left)DVT/PE PPX: HSQGI PPX: PepcidAppreciate Nursing [employer], DO10:33 PM  11/05/19

## 2019-11-06 NOTE — Other
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 back-table renal allograft preparation, left sided renal allograft2. right-sided renal transplantation 3. 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 Approved by the [facility] Multidisciplinary Review Board on 08/24/2010. Listed for transplant on 09/03/2010.  On the date of service, an appropriate allograft became available.  The patient was admitted to the [facility] and evaluated.  The patient was found to be fit to proceed with renal transplantation.  I did have a  discussion with the patient regarding the surgical procedure and the potential for post-operative complications, including; allograft rejection, bleeding, infection, delayed graft function, primary non-function, renal vein thrombosis, renal artery stenosis, rejection, urine leak, ureteral stricture, and hernia.  We discussed the potential negative effects of immunosuppression, including the slight increase in risk for certain types of infections and cancers.  SWe diThe patient understood the risks, asked appropriate questions, and wished to proceed.  Written consent was obtained. Procedure Details:Prior to starting the operation, the renal allograft left kidney was prepared on the back table. The allograft was removed from the packaging in which it was sent to [facility].  The allograft was placed in a sterile organ preservation bag containing cold preservation solution, and supported on slush ice.  The allograft was inspected and found to be a left-sided kidney with one renal artery, one renal vein, and one ureter.  The tissue surrounding the vessels was dissected away sharply and all branches were ligated using 2-0 and 4-0 silk ties.  The artery and vein were both tested and found to have no leaks.  The aorta adjacent to the renal artery ostia was incised sharply with a Metzanbaum scissors to form a Carrel patch.   The fat overlying the capsule of the kidney was dissected away sharply with Metzeanbaum scissors.  The allograft was enclosed in the sterile organ preservation bag and immersed in the slush ice. The patient was brought into the operating room and placed in a supine position on the OR table. Prior to proceeding, a time out was performed in order to identify the patient, the procedure, presence of all required radiographs and/or equipment, the team members, and the timely administration of appropriate antibiotics. In addition, I personally verified that the O blood type of the recipient was compatible with the O blood type of the donor and that the UNOS ID number [id number] on the packaging matched that of the paperwork. This was separately recorded in OpTime with two signatures.After the induction of GET anesthesia,  an invasive blood pressure monitoring line were placed by the anesthesiologist. The urinary bladder was catheterized and irrigated with antibiotic solution. There was no tension on the axillae and all pressure points were padded. Sequential compression boots were used as were Bair Huggers. The abdomen was prepped and draped in the usual sterile fashion.  A  curvelinear incision was created in theright lower quadrant of the abdomen sharply with a #10 blade sharply.  The underlying anterior abdominal wall was opened using  electrocautery. The retroperitoneum was entered using a combination of gentle digital dissection and electrocautery.  The spermatic cord was identified and encircled with a vessel loop that provided medial traction.  The inferior epigastric vessels were identified and double ligated using 2-0 silk ties and divided using Metzenbaum scissors.  A Bookwalter self-retaining retractor was assembled and used to retract the peritoneum and abdominal contents medially.   The right external iliac artery and vein were identified.  The tissue overlying these structures was opened using electrocautery.  The overlying lymphatics were ligated using 4-0 silk ties.  The kidney was brought to the OR table at 11/05/2019  2:08 PM. A Florence Canner clamp was applied to the _right_left_ external iliac vein.  A venotomy was made with an 11 blade sharply.  The vein irrigated, and an end-to-side anastomosis was created with a 5-0 Prolene, applied in a running fashion.  The same technique was used to create an end-to-side renal arterial anastomosis using 6-0 Prolene, applied in a running manner.  The kidney was unclamped and perfused at 11/05/2019  3:07 PM. Anastomosis start time was 11/05/2019  2:08 PM.  Cold ischemia time was 1,416 minutes. Warm ischemia time was 59 minutes. Total ischemia time was 1475 minutes.  Following 2 minutes, the allograft did appear to be reasonably well perfused with mild mottling.  The doppler probe was used to demonstrate a strong biphasic signal in the renal artery, a biphasic signal in the upper and lower poles of the kidney, as well as a strong venous signal in the renal vein.  Hemostasis was pursued using electrocautery and the application of 2-0 silk suture ligatures. After hemostasis was obtained, attention was turned to the creation of the ureteroneocystostomy. The bladder was filled with methylene blue containing sterile normal saline.   The bladder was identified and the detruser muscle was dissected away from the underlying bladder mucosa using gentle blunt dissection and electrocautery.  The detruser muscle was found to be dramatically thickened and fibrotic.  The ureter was cut to an appropriate length and spatulated.  The bladder mucosa was opened with an 11 blade.  The ureteroneocystostomy anastomosis was created in a running manner using a 5-0 PDS.  This anastomosis was created over the top of a single (one) ureteral stent.    This anastomosis was positioned below the detruser muscle by closing the overlying detruser muscle with interrupted 3-0 PDS sutures.  A #10 flat JP was placed in the retroperitoneum, medial to the allograft.  The kidney was positioned in a natural position without tension on the anastomoses.  The Bookwalter blades were removed and the bowel contents were allowed to return to a natural position. The internal oblique was closed in a running manner using a looped 0 PDS.  The external oblique aponeurosis was closed using interrupted 0 PDS applied in a figure-of-eight manner.  Scarpa's fascia was closed using a 2-0 Vicryl suture, applied in a running manner.  The skin was closed with  staples.  At the end of the case the needle, sponge and instrument counts were all correct.  Sterile dressings were applied and the patient was brought to the recovery room in stable condition.URETERAL INFORMATIONThe allograft had one ureter.  One ureteral stent was placed. ATTESTATIONI was present and scrubbed during the entire procedure with the exception of the the final skin closureBart  Pascal Lux, MDTransplant Surgery

## 2019-11-06 NOTE — Plan of Care
Plan of Care Overview/ Patient Status    SOCIAL WORK NOTEPatient Name: Adam Harper Record Number: [id number] Date of Birth: 21-Jun-1962Social Work Follow Up    Most Recent Value Document Type Progress Note Reason for Encounter Care Decision Concerns/Lack of Decision-Maker Intervention Decision-Making/Care Competence Assessment & Resolution Decision-Making/Care Competence Assessment & Resolution Psycho-education Source of Information Medical Team Level of Care Inpatient Social Work Cluster Transplant Identified Clinical/Disposition, Issues/Barriers: This is an automatic consult for [name], who is post operative day 1 from a deceased donor kidney transplant. He was last seen by transplant social work about one month ago in revisit clinic. I have reviewed those notes. Intervention(s)/Summary 15 minutes spent with [name]; I was also present during in-room rounding. [name] reports being in some discomfort, finding foley to be uncomfortable. Given how he was feeling, I kept my visit brief.I inquired if his wife will be visiting today as team would like to begin to provide education about his diet and medications. [name] states that she will be, and I have since alerted team. I provided [name] with post tranpslant educational binder and advised that I will meet with he and wife later today.**Conversation facilitated by [employer] (video) interpreter: [phone] Outcome Ongoing Interventions Handoff Required? No Next Steps/Plan (including hand-off): -Will meet with [name] later today to complete psychosocial assessment and begin to provide transplant related psychoeducation. I have provided him with my contact information. Signature: Arijana Narayan, lcswKidney Transplant Service  Contact Information: [phone]

## 2019-11-06 NOTE — Consults
[facility]	TRANSPLANT NEPHROLOGY CONSULTATION NOTEREASON FOR CONSULT:Presenting for DDKT today. 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 Makes very little urine.Follows at [facility]Gets dialyzed MWF. Last dialysis was on Friday and reports that he typically gets around 5L taken off. EDW: 115kgSeen at HD this morningReports that he feels slightly short of breath while wearing a face mask and requested nasal cannula for comfortPlan for DDKT later todayPAST MEDICAL AND SURGICAL HISTORY Past Medical History: Diagnosis Date ? A-V fistula (HC Code)   right arm ? Anemia  ? AV fistula infection (HC Code)  ? Cirrhosis (HC Code)  ? 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 GERD (gastroesophageal reflux disease)  ? Hypercholesteremia  ? Hyperparathyroidism (HC Code)  ? Hypertension  ? Influenza A  ? Morbid obesity (HC Code) 02/03/2018 ? Obstructive sleep apnea   pt uses CPAP ? Secondary hyperparathyroidism of renal origin (HC Code)  ? Thoracic aortic aneurysm (HC Code)  SOCIAL HISTORY : [place] reports that he has quit smoking. His smoking use included cigarettes. He has a 6.25 pack-year smoking history. He has never used smokeless tobacco. He reports that he does not drink alcohol and does not use drugs.FAMILY HISTORY :  Reviewed, non-contributory.  ALLERGIES : Patient has no known allergies.MEDICATIONS Scheduled Meds: Current Facility-Administered Medications Medication Dose Route Frequency Provider Last Rate Last Admin ? sodium chloride 0.9 % flush 3 mL  3 mL IV Push Q8H Ayoade, Myrle Sheng, MD     Continuous Infusions: PRN Meds: sodium chlorideREVIEW OF SYSTEMS : Constitutional: Negative for appetite change, fatigue and unexpected weight change. HENT: Negative for nosebleeds, sore throat, facial swelling, mouth sores and neck pain.  Respiratory: Negative for cough, chest tightness and shortness of breath.  Cardiovascular: Negative for chest pain, palpitations and leg swelling. Gastrointestinal: Negative for abdominal pain, diarrhea and constipation. Genitourinary: Negative for dysuria, frequency, hematuria, decreased urine volume and difficulty urinating. Musculoskeletal: Negative for arthralgias. Skin: Negative for rash. Neurological: Negative for weakness, light-headedness and headaches. Hematological: Negative for adenopathy. Psychiatric/Behavioral: Negative for dysphoric mood. PHYSICAL EXAM : Vitals:  11/05/19 0953 11/05/19 1013 11/05/19 1047 11/05/19 1052 BP: 139/89 (!) 170/104 (!) 162/101 (!) 150/82 Pulse: 79 73 76 79 Resp:    16 Temp:    (!) 96.4 ?F (35.8 ?C) TempSrc:    Oral SpO2:    97% Weight:    116.3 kg Height:     Gross Totals (Last 24 hours) at 11/05/2019 1109Last data filed at 11/05/2019 1052Intake 400 ml Output 4400 ml Net -4000 ml  Gen: Alert, oriented, comfortably lying flat in bed, no distressNeck: No palpable cervical lymphadenopathy, JVP not elevated. CV: S1 S2 heard, no murmurs, rubs or gallops heard Resp: Bibasilar rales appreciatedAbd: soft, non-tender, non-distended, bowel sounds activeExt: No palpable pedal edema, DP 2+. Right brachiocephalic fistula accessed for HD with good flowsLABS:Recent Labs Lab 08/23/210055 WBC 5.0 HGB 10.2* HCT 30.0* PLT 219 Recent Labs Lab 08/23/210055 NA 140 K 4.8 CL 94* CO2 26 BUN 60* CREATININE 17.07* GLU 104* Recent Labs Lab 08/23/210055 CALCIUM 11.3* MG 2.2 PHOS 8.4* Recent Labs Lab 08/23/210055 ALT 18 AST 17 ALKPHOS 67 BILITOT 0.4  MICROBIOLOGY:  Reviewed RADIOLOGY: Reviewed ASSESSMENT :TERRY BOLOTIN is a 59 y.o.  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 Aimed for 4LHepatitis B Antigen Status Within Last 30 Days or Hepatitis B Surface Antibody Titer>10 Within Last Year Hepatitis B Surface Antibody Positive Dialyzer Revaclear Bicarb 33 meq K+ 2 meq Ca++ 2.5 meq BFR-As tolerated to a maximum of: 400 mL/min DFR 600 mL/min Dialysate Temperature (C) 36 Access Site AVF I have verified the patient's Hepatitis B status and reviewed internal labs or external lab results Yes Fluid Removal Goal EDW Titrate Fluid per the following BP Parameters To maintain MAP >= 60 Notify Provider if Patient: Does not meet parameter requiements Fluid Goal- Dry Weight=specify # of Kg 115kg Dialysis Duration 3.5 Hours - keeping him 1L above his dry weight in anticipating for surgery and to maintain organ perfusion post op- will follow post Op course to see whether he requires HDImmunosuppression  Planned for Campath inductioncPRA 0%- steroid taper and cellcept 1g BID starting post-Op day 0\- CNI initiation tomorrowProphylaxis Fluconazole for 1 monthPJP ppx post op day 3CMV intermediate risk: renally dosed for 3 monthsBlood pressure - Maintain bp <160/95 mm Hg and >120/75 mm Hg for adequate graft functionDiscussed with Dr Ulla Gallo RehanNephrology FellowMHB: [phone] 5pm and on weekends please call the on call fellow (refer to AMION)11/05/2019 11:09 AM

## 2019-11-06 NOTE — Plan of Care
Plan of Care Overview/ Patient Status    Case Management Assessment    Most Recent Value Case Management Assessment Patient Requires Care Coordination Intervention Due To discharge planning needs/concerns Arrived from prior to admission home Bed Hold  no Services Prior to Admission home care agency (specify services), hemodialysis Name of Hemodialysis: [facility] Days of Hemodialysis Monday, Wednesday, Friday Hemodialysis Time 0645 Hemodialysis Mode of Transportation [place] Home care agency name: [facility] ADL Assistance None Type of Home Care Services Home PT, Nurse visit Equipment Currently Used at Home CPAP/BiPAP Documented Insurance Accurate Yes Any financial concerns related to anticipated discharge needs No Patient's home address verified Yes Patient's PCP of record verified Yes Last Date Seen by PCP 0-3 months Living Environment  Lives With Spouse Current Living Arrangements home/apartment/condo Home Accessibility bed and bath on same level, no concerns Transportation Available car, family or friend will provide Home Safety Feels Safe Living In Home unable to assess Source of Clinical History Patient's clinical history has been reviewed and source of Information is: Medical record Completed Assessment Completed Initial Assesment by: Name Adam Harper Role: Transition Coordinator CM/SW Attestation: Choose which ONE is appropriate for you I have reviewed the medical record and agree with the above evaluation by the transition coordinator/discharge planning coordinator with the following recommendations. Yes Discharge Planning Coordination Recommendations Discharge Planning Coordination Recommendations Needs not determined at this time  Chart Reviewed?.59 yo m. Admitted for Left Kidney Transplant, Allograft, Donor-Brain Death. Hx: ESRD, HDD since 08-15-10.  Initial Assessment...Marland KitchenMarland KitchenSee initial assessment documentation for comprehensive assessment of pre-hospital functional. Care Coordinator will continue to follow.  Problem: Adult Inpatient Plan of CareGoal: Readiness for Transition of CareOutcome: Interventions implemented as appropriate Adam Harper [facility] Care CoordinatorMHB: [phone]

## 2019-11-06 NOTE — Other
TRANSPLANT PHARMACY:  KIDNEY TRANSPLANT POST-OP INITIAL NOTELuis A Harper (59 y.o. male) is POD #1 from DDKT for ESRD secondary to FSGS.Patient is receiving induction therapy with Alemtuzumab. Plan for maintenance immunosuppression with Tacrolimus (Prograf), Mycophenolate Mofetil (Cellcept) and Prednisone.  Serology Donor Recipient Recommendation CMV IgG positive positive R+: Prophylaxis with valganciclovir for 3 months Hepatitis B Core Antibody negative negative Both Donor and Recipient HBcAB-: No prophylaxis needed Hepatitis B Surface Antibody N/A is > 12 mIU/mL  Hepatitis C NAT negative unknown No recommendation for treatment PJP (toxo status) positive negative Donor and/or patient is toxo IgG positive: bactrim or atovaquone x 6 months Anti-Fungal N/A Prophylaxis with fluconazole 100 mg x 1 month Adam Harper does not have a history of diabetes.Patient does not have pharmacogenomic data.Medications will be sent to [facility]patient [facility] at [facility] and pharmacy will follow-up with social work to ensure that there are no barriers to receiving medications.  Nursing will reinforce medication education on daily basis. Pharmacist to educate patient and provide customized MedActionPlan prior to discharge.[name], PharmDAugust 24, 20213:22 PM

## 2019-11-06 NOTE — Plan of Care
Plan of Care Overview/ Patient Status    NPN: Pt A&Ox4. Spanish speaking primarily with speech impediment. VSS with SBP 140s-150s. Pt on 2L O2 NC at 93-94%. Pt uses CPAP at home at night but refuses use at hospital. Rhonchi ausculted on anterior lungs. Post op kidney txp day 1 meds (solumedrol, diflucan, and cellcept) administered this AM as ordered. Tolerated meds well. Pt to start tacrolimus this PM and due for tacro level tomorrow on AM labs. K+ elevated at 5.5 on AM labs. Team aware. Administered IV Lasix as ordered and repeat labs showed K+ 5.5. IV Lasix repeated and repeat labs will be collected this PM. 1:1 IVF titrated this AM as ordered, then pt transitioned to continuous NS infusion. Foley cath draining clear, light pink/yellow urine. No BMs this shift. Pt OOB and up in chair. Ambulated pt down the hall w/ walker and x1 assist twice this shift. Pre-medicated pt 1 hr prior to ambulation with tramadol and tylenol to control 8/10 pain. JP draining to gravity in RLQ. Dressing in RLQ with dried drainage, dry and intact. Fall precautions in place. Will continue to monitor above problems. See flowsheet for details.

## 2019-11-06 NOTE — Plan of Care
Plan of Care Overview/ Patient Status    Received from Pacu, A/0 x4 -uncomfortable, repositioned - pillow placed on his rt side - temporary relief.  More repos done. Tramadol given later - fair effect. Dr. Wylene Men  at bedside. Tylenol then oxy given for incisional pain with good effect.Later on pt was very uncomfortable , crying with pain upon passing urine and wanted foley out. Explained that foley needs to stay and will ask MD if anything can be given for his pain. Requested to stand at bedside. Assisted pt to sit on side of bed first and assisted in standing like almost 1am. Stat lock holding the foley repos as it looks like it was pulling. Now wanted to sit on the chair. MD gave permission for him to sit / sleep on bsc. Refused cpap tonight, md aware. Continues with  hourly1:1 replacement, with low urine ouptput , less then 50 cc/hr - peach colored urine. MD is ware as well.Bilat arm swelling noted , Lungs with + ronchi on the anterior. Pt is difficult to understand at times. Rt Jp with small amount of ss drainage, site cdi. RLQ incision with gauze and tegaderm , + drainage.  Encouraged to use IS, v-boots on. Sleeping on the chair, hourly rounding done. CAll bell with reach. K -5.5 Dr Theresia Bough notified [phone]- no new oders yet

## 2019-11-06 NOTE — Other
PACU to Floor Nursing Transfer NotePreop Diagnosis: Focal glomerular sclerosis [Z56.1]Procedure Done: S/P kidneyTransplant Any Significant Events Intra-Op: noneAbnormal Assessment in PACU: noneLevel of Consciousness: awake, alert  and orientedLast Set of VS:  Vitals:  11/05/19 1915 11/05/19 1930 11/05/19 1945 11/05/19 2000 BP: (!) 157/84 (!) 169/92 (!) 171/81 (!) 168/82 Pulse: 68 70 74 76 Resp: (!) 10 (!) 13 (!) 21 20 Temp:    97.7 ?F (36.5 ?C) TempSrc:    Temporal SpO2: 98% 99% 98% 98% Weight:     Height:     Device (Oxygen Therapy): nasal cannula O2 Flow (L/min): 3Baseline Neuro/developmental Status: WDLLabs Collected: YesSpecial Needs of the Patient: noneAntibiotics: last dose given in OR: see MARIV Access: Periph IV-single(adult) 11/25/18 1450 cephalic (thumb side), right 22 gauge Nurse (Active)   Periph IV-single(adult) metacarpal(dorsum of hand), left over-the-needle catheter system 18 gauge Anesthesiologist (Active) SiteCare/Dressing Status/Securement dressing dry and intact 11/05/19 1900 Date Dressing Applied/Changed 11/05/19 11/05/19 1900 Next Date Dressing Change 11/12/2019 11/05/19 1900 Lumen 1 Patency/Care Patent;flushed w/o difficulty 11/05/19 1900 Site Signs asymptomatic with no redness, no swelling, no drainage 11/05/19 1900 Phlebitis 1-->erythema at access site with or without pain 11/05/19 1900 Infiltration/Extravasation Assessment 0-->no symptoms 11/05/19 1900 Patient Education Instructed to keep IV site dry;Instructed to call nurse if site is painful,red,swollen, burning;Instructed to call nurse if fluid leaking from site 11/05/19 1900 Daily Review of Necessity ** completed 11/05/19 1751 IV Fluids: ? lactated Ringers Stopped (11/05/19 1900) Pain Assessment: Number Scale (0-10) Right anterior hip-Pain Rating (0-10): Rest: 7Pain Assessment: Number Scale (0-10) Right anterior hip-Pain Rating (0-10): Activity: 7Number Scale Pain Rating 0: 0-->no painPain Assessment: Number Scale (0-10) Right anterior hip-Pharmaceutical Interventions: single medication modality Wound 11/05/19 1704 Incision Right abdomen-Incision Closure : unable to assess Drain/Device Site 11/05/19 1630 Right lower abdomen other (see comments)-Drainage Amount: smallBody Position: supine, head elevatedHead of Bed (HOB) Positioning: HOB at 30-45 degrees  Drain/Device Site 11/05/19 1630 Right lower abdomen other (see comments)-General Output (mL): 20Time of Last Void or Time that Urinary Catheter was Removed Intra-Op: catheter in place Additional Info: Received patient S/P Right renal transplant ~ Right lower abdominal dressing with small amount of drainage JP drain x1 with minimal  amount of drainage ~ !/2 NS 1;1 at 50 cc's /hr urine output between 25 cc- 30 cc's /hr VSS ~ afebrile ~ awaiting transport .Addendum ~ B/P elevated c/o 6/10 abdominal pain oxycodone 5 mg given B/P improving . Contact RN (name and phone number): Rolm Bookbinder RN [phone]
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
## 2019-11-07 ENCOUNTER — Encounter: Admit: 2019-11-07 | Payer: PRIVATE HEALTH INSURANCE | Primary: Family Medicine

## 2019-11-07 ENCOUNTER — Telehealth: Admit: 2019-11-07 | Payer: PRIVATE HEALTH INSURANCE | Attending: Advanced Practice Nursing | Primary: Family Medicine

## 2019-11-07 DIAGNOSIS — I10 Essential (primary) hypertension: Secondary | ICD-10-CM

## 2019-11-07 DIAGNOSIS — T827XXA Infection and inflammatory reaction due to other cardiac and vascular devices, implants and grafts, initial encounter: Secondary | ICD-10-CM

## 2019-11-07 DIAGNOSIS — J101 Influenza due to other identified influenza virus with other respiratory manifestations: Secondary | ICD-10-CM

## 2019-11-07 DIAGNOSIS — K7460 Unspecified cirrhosis of liver: Secondary | ICD-10-CM

## 2019-11-07 DIAGNOSIS — E213 Hyperparathyroidism, unspecified: Secondary | ICD-10-CM

## 2019-11-07 DIAGNOSIS — K219 Gastro-esophageal reflux disease without esophagitis: Secondary | ICD-10-CM

## 2019-11-07 DIAGNOSIS — I770 Arteriovenous fistula, acquired: Secondary | ICD-10-CM

## 2019-11-07 DIAGNOSIS — E7800 Pure hypercholesterolemia, unspecified: Secondary | ICD-10-CM

## 2019-11-07 DIAGNOSIS — D649 Anemia, unspecified: Secondary | ICD-10-CM

## 2019-11-07 DIAGNOSIS — I712 Thoracic aortic aneurysm (HC Code): Secondary | ICD-10-CM

## 2019-11-07 DIAGNOSIS — G4733 Obstructive sleep apnea (adult) (pediatric): Secondary | ICD-10-CM

## 2019-11-07 DIAGNOSIS — N186 End stage renal disease: Secondary | ICD-10-CM

## 2019-11-07 DIAGNOSIS — N2581 Secondary hyperparathyroidism of renal origin: Secondary | ICD-10-CM

## 2019-11-07 LAB — CBC WITH AUTO DIFFERENTIAL
BKR WAM ABSOLUTE IMMATURE GRANULOCYTES: 0.1 x 1000/??L (ref 0.0–0.3)
BKR WAM ABSOLUTE LYMPHOCYTE COUNT: 0.1 x 1000/ÂµL — ABNORMAL LOW (ref 1.0–4.0)
BKR WAM ABSOLUTE NRBC: 0 x 1000/??L (ref 0.0–0.0)
BKR WAM ANALYZER ANC: 13 x 1000/ÂµL — ABNORMAL HIGH (ref 1.0–11.0)
BKR WAM BASOPHIL ABSOLUTE COUNT: 0 x 1000/??L (ref 0.0–0.0)
BKR WAM BASOPHILS: 0.1 % (ref 0.0–4.0)
BKR WAM EOSINOPHIL ABSOLUTE COUNT: 0 x 1000/??L (ref 0.0–1.0)
BKR WAM EOSINOPHILS: 0 % (ref 0.0–7.0)
BKR WAM HEMATOCRIT: 33 % — ABNORMAL LOW (ref 37.0–52.0)
BKR WAM HEMOGLOBIN: 11 g/dL — ABNORMAL LOW (ref 12.0–18.0)
BKR WAM IMMATURE GRANULOCYTES: 1 % (ref 0.0–3.0)
BKR WAM LYMPHOCYTES: 0.7 % — ABNORMAL LOW (ref 8.0–49.0)
BKR WAM MCH (PG): 36.4 pg — ABNORMAL HIGH (ref 27.0–31.0)
BKR WAM MCHC: 33.3 g/dL (ref 31.0–36.0)
BKR WAM MCV: 109.3 fL — ABNORMAL HIGH (ref 78.0–94.0)
BKR WAM MONOCYTE ABSOLUTE COUNT: 0.6 x 1000/??L (ref 0.0–2.0)
BKR WAM MONOCYTES: 4.1 % (ref 4.0–15.0)
BKR WAM MPV: 9.9 fL (ref 6.0–11.0)
BKR WAM NEUTROPHILS: 94.1 % — ABNORMAL HIGH (ref 37.0–84.0)
BKR WAM NUCLEATED RED BLOOD CELLS: 0 % (ref 0.0–1.0)
BKR WAM PLATELETS: 214 x1000/??L — ABNORMAL HIGH (ref 140–440)
BKR WAM RDW-CV: 14.1 % (ref 11.5–14.5)
BKR WAM RED BLOOD CELL COUNT: 3 M/??L — ABNORMAL LOW (ref 3.8–5.9)
BKR WAM WHITE BLOOD CELL COUNT: 13.8 x1000/ÂµL — ABNORMAL HIGH (ref 4.0–10.0)

## 2019-11-07 LAB — BASIC METABOLIC PANEL
BKR ANION GAP: 22 — ABNORMAL HIGH (ref 7–17)
BKR BLOOD UREA NITROGEN: 69 mg/dL — ABNORMAL HIGH (ref 6–20)
BKR BUN / CREAT RATIO: 4.8 — ABNORMAL LOW (ref 8.0–23.0)
BKR CALCIUM: 9.4 mg/dL (ref 8.8–10.2)
BKR CHLORIDE: 92 mmol/L — ABNORMAL LOW (ref 98–107)
BKR CO2: 17 mmol/L — ABNORMAL LOW (ref 20–30)
BKR CREATININE: 14.26 mg/dL — ABNORMAL HIGH (ref 0.40–1.30)
BKR EGFR (AFR AMER): 4 mL/min/{1.73_m2} (ref >60)
BKR EGFR (NON AFRICAN AMERICAN): 4 mL/min/1.73m2 (ref >60)
BKR GLUCOSE: 119 mg/dL — ABNORMAL HIGH (ref 70–100)
BKR POTASSIUM: 6 mmol/L — CR (ref 3.3–5.1)
BKR SODIUM: 131 mmol/L — ABNORMAL LOW (ref 136–144)
BKR WAM BASOPHILS: 22 % — ABNORMAL HIGH (ref 7–17)

## 2019-11-07 LAB — PHOSPHORUS     (BH GH L LMW YH): BKR PHOSPHORUS: 7.1 mg/dL — ABNORMAL HIGH (ref 2.2–4.5)

## 2019-11-07 LAB — MAGNESIUM: BKR MAGNESIUM: 2.2 mg/dL (ref 1.7–2.4)

## 2019-11-07 LAB — TACROLIMUS LEVEL     (BH GH L LMW YH): BKR TACROLIMUS BLOOD: 1.1 ng/mL — ABNORMAL LOW (ref 20–30)

## 2019-11-07 MED ORDER — CALCIUM ACETATE(PHOSPHATE BINDERS) 667 MG CAPSULE
667 mg | Freq: Three times a day (TID) | ORAL | Status: DC
Start: 2019-11-07 — End: 2019-11-11
  Administered 2019-11-07 – 2019-11-10 (×10): 667 mg via ORAL

## 2019-11-07 MED ORDER — FUROSEMIDE 10 MG/ML INJECTION SOLUTION
10 mg/mL | Freq: Once | INTRAVENOUS | Status: CP
Start: 2019-11-07 — End: ?
  Administered 2019-11-07: 14:00:00 10 mL via INTRAVENOUS

## 2019-11-07 NOTE — Plan of Care
Plan of Care Overview/ Patient Status    Acute Dialysis Nursing Treatment Note Dialysate Bath : 2K2.5CaAccess : R AVFDuration of Treatment : 3 hrsNet Volume Ultrafiltrated : net evenBlood pressure trends : SBP's 151-178Medications administered : noneComments : Report received from primary RN. Pt arrived in unit via bed. Pt is alert and oriented x4, Spanish speaking, on room air, no c/o pain.R AVF, positive and strong bruit and thrill, accessed without difficulty using 15G x2 needles. Treatment tolerated well.  40 min into treatment, pt c/o SOB and requested O2 for comfort, O2 sat 93-94%, placed on O2 2LPM, 30 min after pt verbalized feeling better and took O2 off.Hypertensive SBP 150-170's, Dr Lily Kocher made aware, no new changes.Katie RN assumed care of pt at 1650, report givenSheena Po, RN

## 2019-11-07 NOTE — Telephone Encounter
Interpretor ID #[id number] was used for the following encounter: Attempted to call patient to complete IP rounds. Unable to reach. Left VM with information and call back number.

## 2019-11-07 NOTE — Plan of Care
Adam Harper				Location: 48 DIALYSIS/8520-A59 y.o., male					Attending: Astrid Divine I*	Admit Date: 11/04/2019			[id number] LOS: 3 days TRANSPLANT NUTRITION ASSESSMENT: INITIAL No Known AllergiesAnthropometrics:Height: Ht Readings from Last 1 Encounters: 11/05/19 5' 9 (1.753 m) Current Weight: Wt Readings from Last 1 Encounters: 11/07/19 119 kg  Reported Dry Weight at Dialysis: 115 kgBMI: Body mass index is 37.5 kg/m?Marland KitchenDosing WT: 80 kgAdditional Weight Information: Weight has been between 112-116 kg over the past 9 years. Nutrition focused physical assessment: Unable to completeESTIMATED NUTRITION REQUIREMENTS:Kcal/day: 2400   (30 kcal/kg)		Protein/day: 120 (1.5 gm/kg)Fluids/day: Fluid management per medical team discretion- 2400 mls= 1 ml/calorieNeeds based on: 80 kgDosing wt, ESRD s/p DDKT, JP drainage, steroids, dialysisCURRENT DIET ORDER: Modified regular 2g K+ NUTRITION ASSESSMENT:Cultural/Religious/Ethnic Needs: Yes Spanish interpreter requiredDiscussed patient at interdisciplinary rounds. Patient's chart reviewed for new DDKT.  Patient known to this Clinical research associate from outpatient follow up.  At last check in (05/21/19), he was doing well with his phosphorous restriction and RD at dialysis noted good adherence.  No nutrition concerns with stable weight.  Suspect he was able to meet his estimated nutrient needs and appeared to have appropriate mineral content of meals based on RD report.  Since admit, no concerns with appetite documented post-op. Attempted to speak with Mr. Eckerson but he was off the floor for dialysis at multiple visits.  Called his wife in an attempt to start post-transplant education.  She reports she was going to come in to discuss but as of this note, had not yet arrived to the floor.  Per RN report, noted 75% of omelette consumed this morning.  Yesterday, 100% of breakfast, 50% of lunch and 50% of dinner were consumed.  No need for zofran since day of surgery.  Suspect patient is able to meet his estimated nutrient needs post-op with diet alone.  Noted serum potassium elevated with slow clearance per chart- dietary restriction.  Phos also elevated necessitating addition of binders today.  BG mildly elevated- no insulin coverage needed with no baseline DM noted.  No BM noted since surgery- regimen started.NUTRITION DIAGNOSIS/MALNUTRITION CLASSIFICATION:At risk for malnutritionIncreased nutrient needs related to surgical stressors post-op as evidenced by current practice guidelines. INTERVENTIONS/RECOMMENDATIONS: Meal and Snacks: ?	Composition of meals and snacks-- Agree with 2 gK+ diet at this time- consider adding no added salt modifier.  Will monitor serum phos levels- phos binder has been started- will keep diet content liberal to allow protein needs to be met.?	Modify distribution, type or amount of food and nutrients within meals or at specified times -- continue to encourage frequent kcal/pro rich PO intakes at all meals and snacks?	Specific foods/beverages or groups- Lower potassium, phosphorous and sodium choices until renal clearance improvesMedical Food Supplements: ?	Commercial beverage: None at this time- will consider adding as needed to meet needs. Nutrition-related medication management: ?	Bowel regimen per teamNutrition Education:?	Attempted to provide education but patient was off the floor and wife was unavailable.  Will discuss with him and his wife closer to d/c when they are availableGoals:?	Patient will attempt to consume >75% of three meals per day by next nutrition visit- status:new goalCollaboration and Referral of Nutrition Care?	Team meeting- discussed above at interdisciplinary rounds?	Collaboration with other providers- Discussed above with team, diet order adjusted per protocolDischarge and Transfer of Nutrition Care to New Setting or Provider?	Diet advanced, patient tolerating.?	Nutrition related discharge needs still being determined at this time, will continue to follow. MONITORING / EVALUATION:?	Will continue to follow and adjust nutrition plan as needed/consulted. ?	Upon weekly follow up, will subjectively monitor and assess as parameters available: weight change, energy intake,  protein intake, electrolyte/renal profile, glucose profile, gastrointestinal status, overall nutritional status, ability to recall nutrition goals as needed/appropriate.Gar Gibbon Rooney Gladwin, RDTransplant Dietitian Mobile Heartbeat (M-F 8:00-4:00PM): Dynamic Role TXP RD: [phone] note: Nutrition is a consult only service on weekends and holidays.  Please enter a consult in EPIC if assistance is needed on a weekend or holiday or call [phone] to be directed to covering RD.

## 2019-11-07 NOTE — Plan of Care
Plan of Care Overview/ Patient Status    A/ o x4, afebrile. Reports pain is getting better. Sched tylenol and prn tramadol with good effect. Rpt K at 6pm - 5.4 Dr Theresia Bough aware - no new order. Rpt labs in am.Foley to gravity , cyu - see flowsheet for 1's and o's. IVF at 50 cc/hr.RLQ incision with drsg on , rt JP x1 - minimal amount of serous drainage. Sleeping in the chair. V- boots on. Encouraged use of IS. Needs attended. [id number] Reported to Dr Theresia Bough k of 6.0 - awaiting intervention

## 2019-11-07 NOTE — Other
TRANSPLANT PHARMACY: TRANSPLANT POST-OP FOLLOW-UP NOTE #1[name] (59 y.o. male) is POD #1 from DDKT.I provided patient with a customized MedActionPlan and counseled on the medication regimen, including immunosuppression, prophylactic agents, and supportive care agents. I discussed the daily medication schedule, how to take each medication, common side effects of each medication, how to take medications on days of clinic visits, foods and medications to avoid post-transplant and other pertinent medication related information. Of note, Spanish interpreter used for interview (ID# [id number]). The patient demonstrates and verbalizes understanding of the medication regimen. Adam Harper recognizes the importance of medication adherence and routine follow-up at clinic.  [name] reports adherence to his medication, with a [place] Medication Adherence Scale (MMAS-8) score of [place] Medication Adherence Scale (MMAS) 10/02/2019 [place] Medication Adherence Score 8 MMAS Score Interpretation High Adherence  Based on answers to survey, low concern for non-adherence.Pharmacy to provide counseling and an updated MedActionPlan once more prior to discharge.[address] PharmDAugust 24, 20[address]:20 PM

## 2019-11-08 LAB — BASIC METABOLIC PANEL
BKR ANION GAP: 17 (ref 7–17)
BKR BLOOD UREA NITROGEN: 56 mg/dL — ABNORMAL HIGH (ref 6–20)
BKR BUN / CREAT RATIO: 5.4 — ABNORMAL LOW (ref 8.0–23.0)
BKR CALCIUM: 9.6 mg/dL (ref 8.8–10.2)
BKR CHLORIDE: 97 mmol/L — ABNORMAL LOW (ref 98–107)
BKR CO2: 23 mmol/L (ref 20–30)
BKR CREATININE: 10.29 mg/dL — ABNORMAL HIGH (ref 0.40–1.30)
BKR EGFR (AFR AMER): 6 mL/min/{1.73_m2} (ref >60)
BKR EGFR (NON AFRICAN AMERICAN): 5 mL/min/{1.73_m2} (ref >60)
BKR GLUCOSE: 114 mg/dL — ABNORMAL HIGH (ref 70–100)
BKR POTASSIUM: 4.9 mmol/L (ref 3.3–5.1)
BKR SODIUM: 137 mmol/L (ref 136–144)

## 2019-11-08 LAB — CBC WITH AUTO DIFFERENTIAL
BKR WAM ABSOLUTE IMMATURE GRANULOCYTES: 0.1 x 1000/??L (ref 0.0–0.3)
BKR WAM ABSOLUTE LYMPHOCYTE COUNT: 0 x 1000/??L — ABNORMAL LOW (ref 1.0–4.0)
BKR WAM ABSOLUTE NRBC: 0 x 1000/??L (ref 0.0–0.0)
BKR WAM ANALYZER ANC: 9.1 x 1000/??L (ref 1.0–11.0)
BKR WAM BASOPHIL ABSOLUTE COUNT: 0 x 1000/??L (ref 0.0–0.0)
BKR WAM BASOPHILS: 0 % (ref 0.0–4.0)
BKR WAM EOSINOPHIL ABSOLUTE COUNT: 0 x 1000/??L (ref 0.0–1.0)
BKR WAM EOSINOPHILS: 0 % (ref 0.0–7.0)
BKR WAM HEMATOCRIT: 31.7 % — ABNORMAL LOW (ref 37.0–52.0)
BKR WAM HEMOGLOBIN: 10.7 g/dL — ABNORMAL LOW (ref 12.0–18.0)
BKR WAM IMMATURE GRANULOCYTES: 1.1 % (ref 0.0–3.0)
BKR WAM LYMPHOCYTES: 0.4 % — ABNORMAL LOW (ref 8.0–49.0)
BKR WAM MCH (PG): 35.3 pg — ABNORMAL HIGH (ref 27.0–31.0)
BKR WAM MCHC: 33.8 g/dL (ref 31.0–36.0)
BKR WAM MCV: 104.6 fL — ABNORMAL HIGH (ref 78.0–94.0)
BKR WAM MONOCYTE ABSOLUTE COUNT: 0.7 x 1000/??L (ref 0.0–2.0)
BKR WAM MONOCYTES: 6.9 % (ref 4.0–15.0)
BKR WAM MPV: 10.2 fL (ref 6.0–11.0)
BKR WAM NEUTROPHILS: 91.6 % — ABNORMAL HIGH (ref 37.0–84.0)
BKR WAM NUCLEATED RED BLOOD CELLS: 0 % (ref 0.0–1.0)
BKR WAM PLATELETS: 200 x1000/??L (ref 140–440)
BKR WAM RDW-CV: 14.2 % (ref 11.5–14.5)
BKR WAM RED BLOOD CELL COUNT: 3 M/??L — ABNORMAL LOW (ref 3.8–5.9)
BKR WAM WHITE BLOOD CELL COUNT: 9.9 x1000/??L (ref 4.0–10.0)

## 2019-11-08 LAB — PHOSPHORUS     (BH GH L LMW YH): BKR PHOSPHORUS: 6.8 mg/dL — ABNORMAL HIGH (ref 2.2–4.5)

## 2019-11-08 LAB — MAGNESIUM: BKR MAGNESIUM: 2.2 mg/dL (ref 1.7–2.4)

## 2019-11-08 LAB — TACROLIMUS LEVEL     (BH GH L LMW YH): BKR TACROLIMUS BLOOD: 2.1 ng/mL — ABNORMAL LOW

## 2019-11-08 MED ORDER — SULFAMETHOXAZOLE 800 MG-TRIMETHOPRIM 160 MG TABLET
800-160 mg | ORAL | Status: DC
Start: 2019-11-08 — End: 2019-11-11
  Administered 2019-11-09: 13:00:00 800-160 mg via ORAL

## 2019-11-08 MED ORDER — FUROSEMIDE 10 MG/ML INJECTION SOLUTION
10 mg/mL | Freq: Once | INTRAVENOUS | Status: CP
Start: 2019-11-08 — End: ?
  Administered 2019-11-08: 16:00:00 10 mL via INTRAVENOUS

## 2019-11-08 MED ORDER — AMLODIPINE 10 MG TABLET
10 mg | Freq: Every day | ORAL | Status: DC
Start: 2019-11-08 — End: 2019-11-11
  Administered 2019-11-09 – 2019-11-10 (×3): 10 mg via ORAL

## 2019-11-08 MED ORDER — METOPROLOL TARTRATE 5 MG/5 ML IV
55 mg/ mL | Freq: Once | INTRAVENOUS | Status: DC
Start: 2019-11-08 — End: 2019-11-09

## 2019-11-08 MED ORDER — ZZ IMS TEMPLATE
Freq: Two times a day (BID) | ORAL | Status: DC
Start: 2019-11-08 — End: 2019-11-10
  Administered 2019-11-08 – 2019-11-10 (×4): 0.5 mg via ORAL

## 2019-11-08 MED ORDER — VALGANCICLOVIR 450 MG TABLET
450 mg | ORAL | Status: DC
Start: 2019-11-08 — End: 2019-11-11
  Administered 2019-11-08: 16:00:00 450 mg via ORAL

## 2019-11-08 NOTE — Progress Notes
[facility]	 [facility]	Transplant Nephrology Progress NoteS:Doing well this morningHas started to make urine 1.4 L of urine despite 120 mg lasix x 2. No complaints. No shortness of breath chest pain. Pain much improved. MEDICATIONS:Scheduled Meds:Current Facility-Administered Medications Medication Dose Route Frequency Provider Last Rate Last Admin ? acetaminophen (TYLENOL) tablet 650 mg  650 mg Oral Q6H Tommas Olp, MD   650 mg at 11/07/19 0535 ? calcium acetate(phosphat bind) (PHOSLO) capsule 667 mg  667 mg Oral TID WC Harris, Apphia, APRN   667 mg at 11/07/19 0957 ? fluconazole (DIFLUCAN) tablet 100 mg  100 mg Oral Daily Tommas Olp, MD   100 mg at 11/07/19 7829 ? heparin (porcine) injection 5,000 Units  5,000 Units Subcutaneous Damaris Schooner, MD   5,000 Units at 11/07/19 0534 ? lidocaine (XYLOCAINE) 2 % jelly   Topical (Top) Once Mirasol, Joynell, APRN     ? [START ON 11/08/2019] methylPREDNISolone PF (Solu-MEDROL) injection 120 mg  120 mg IV Push Daily Tommas Olp, MD      Followed by ? Melene Muller ON 11/09/2019] methylPREDNISolone PF (Solu-MEDROL) injection 80 mg  80 mg IV Push Daily Tommas Olp, MD      Followed by ? Melene Muller ON 11/10/2019] predniSONE (DELTASONE) tablet 10 mg  10 mg Oral Daily Tommas Olp, MD     ? mycophenolate mofetil (CELLCEPT) capsule 1 g  1 g Oral Q12H Tommas Olp, MD   1 g at 11/07/19 5621 ? pantoprazole (PROTONIX) EC tablet 40 mg  40 mg Oral Daily Mirasol, Joynell, APRN   40 mg at 11/07/19 0850 ? polyethylene glycol (MIRALAX) packet 17 g  17 g Oral Daily Mirasol, Joynell, APRN   17 g at 11/07/19 0850 ? senna-docusate (SENNA-PLUS) 8.6-50 mg per tablet 1 tablet  1 tablet Oral Nightly Mirasol, Joynell, APRN   1 tablet at 11/06/19 2053 ? tacrolimus (PROGRAF) Immediate Release capsule 5 mg  5 mg Oral Q12H (0600-1800) Mirasol, Joynell, APRN   5 mg at 11/07/19 0540 Continuous Infusions:PRN Meds:.ondansetron **OR** ondansetron (PF), traMADoL, traMADoLPE:Vitals:  11/07/19 1112 BP: (!) 159/89 Pulse: 80 Resp: 18 Temp: 97.9 ?F (36.6 ?C) Gen: alert and oriented, NADCV: S1/S2 no m/r/gResp: CTABAbd: soft, incision approximated.  Ext: trace pedal edema. LABS:LabsRecent Labs Lab 08/23/211805 08/24/210524 08/25/210535 WBC 9.4 11.6* 13.8* HGB 10.7* 11.5* 11.0* HCT 31.4* 33.9* 33.0* PLT 221 225 214  Recent Labs Lab 08/24/211106 08/24/211818 08/25/210535 08/25/210734 NA 132* 132* 131*  --  K 5.5* 5.4* 6.0*  --  CL 93* 93* 92*  --  CO2 18* 18* 17*  --  BUN 49* 54* 69*  --  CREATININE 13.02* 13.49* 14.26*  --  GLU 138* 136* 119* 116* ANIONGAP 21* 21* 22*  --  Recent Labs Lab 08/24/211106 08/24/211818 08/25/210535 CALCIUM 9.6 9.9 9.4 MG  --   --  2.2 PHOS  --   --  7.1*  Recent Labs Lab 08/23/210055 PTT 26.0 LABPROT 11.4 INR 1.06  Recent Labs Lab 08/23/210055 ALT 18 AST 17 ALKPHOS 67 BILITOT 0.4  MICROBIOLOGY:reviewedRADIOLOGY:X-Ray Chest PA and LateralResult Date: 8/23/2021No acute radiographic finding. Reported And Signed By: Beverly Gust, MDUS Renal Transplant With DopplerResult Date: 8/23/2021Satisfactory appearances of the renal transplant. Increased velocity in the post anastomotic external iliac artery, with increased resistance. This may be secondary to normal postsurgical effects. Recommend attention on follow-up. Report Initiated By:  Jarrett Soho, MD Reported And Signed By: Earl Many, MDAssessment:Adam Harper is a 59 y.o.  gentleman with a past medical history of ESKD  from [facility], on HD, history of GERD, hyperlipidemia, OSA who presented to the hospital for a deceased donor kidney transplant.He is overall doing well and started to make urine. He is hyperkalemic today despite aggressive diuresis. As such he will require HD for potassium management. Transplant on 11/05/2019 with Campath induction on steroid taper and MMF 1 g BID. cPRA 0%. CMV D+/R+, Donor is Toxo IgG + 0.2. Graft is making adequate amounts of urine but he has yet to start clearing. No concerns for volume at the moment. Renal Plan:- Follow I/Os. - Continue to follow renal function. - Give lasix 120 mg, repeat BMP at noon. - Start Phoslo 667 mg TID with meals. - Plan for HD today.	K 2	Bi 33	Ca 2.5	UF: 0Immunosuppression- Campath induction per protocol- Steroid taper- MMF 1 g BID. - Will determine initiation of tac based on BMP. - Fluconazole daily. - Awaiting recipient toxo IgG. - Will need to consider PJP prophylaxis in the oncoming days. - Will need to start CMV ppx in the upcoming days. Case was discussed with Dr. Revonda Standard. Please refer to his addendum for final recommendations. Kandee Keen Fellow8/25/2021After hours and on Weekends please contact the Renal Fellow on call (see AMION)

## 2019-11-08 NOTE — Plan of Care
Plan of Care Overview/ Patient Status    1900-0700: A&Ox4.  Pain well controlled on scheduled tylenol.  Remained in bedside chair this shift.  Foley in place with cyu.  Incision CDI with staples in place.  JP with serous drainage.  Safety maintained.  See flow sheet for details.Problem: Adult Inpatient Plan of CareGoal: Plan of Care ReviewOutcome: Interventions implemented as appropriateGoal: Patient-Specific Goal (Individualized)Outcome: Interventions implemented as appropriateGoal: Absence of Hospital-Acquired Illness or InjuryOutcome: Interventions implemented as appropriateGoal: Optimal Comfort and WellbeingOutcome: Interventions implemented as appropriateGoal: Readiness for Transition of CareOutcome: Interventions implemented as appropriate Problem: Impaired Wound HealingGoal: Optimal Wound HealingOutcome: Interventions implemented as appropriate Problem: Adjustment to Transplant (Kidney Transplant)Goal: Optimal Coping with Organ TransplantOutcome: Interventions implemented as appropriate Problem: Donor Organ Function (Kidney Transplant)Goal: Effective Renal FunctionOutcome: Interventions implemented as appropriate Problem: Fluid Imbalance (Kidney Transplant)Goal: Fluid BalanceOutcome: Interventions implemented as appropriate Problem: Infection (Kidney Transplant)Goal: Absence of Infection Signs and SymptomsOutcome: Interventions implemented as appropriate Problem: Oral Intake Inadequate (Kidney Transplant)Goal: Optimal Nutrition IntakeOutcome: Interventions implemented as appropriate Problem: Pain (Kidney Transplant)Goal: Acceptable Pain ControlOutcome: Interventions implemented as appropriate Problem: Fall Injury RiskGoal: Absence of Fall and Fall-Related InjuryOutcome: Interventions implemented as appropriate

## 2019-11-08 NOTE — Plan of Care
Plan of Care Overview/ Patient Status    0700-1900 P.M.  Pt remains alert and orient x 4.  VSS/FS monitored. Coverage given as needed. IID in left hand patent. +/+ AVF in left arm. ABD distended. JP in rt quadrant draining clear serous fluid. Staples to rlq cdi. Foley cath draining clear yellow urine. No BM this shift. Pt to HD for afternoon. Return early evening with wife at bedside. Incentive spirometer encouraged. Appetite is good. Ambulate around unit with walker and this Clinical research associate. Fall/safety precautions maintained.

## 2019-11-08 NOTE — Plan of Care
Inpatient Physical Therapy Evaluation IP Adult PT Eval/Treat - 11/08/19 1558    Date of Visit / Treatment  Date of Visit / Treatment 11/08/19   Note Type Evaluation   Progress Report Due 11/22/19   End Time 1558    General Information  Pertinent History Of Current Problem Per chart:Adam Harper is a 59 y.o. 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 Patient is doing well. Pain is well controlled and urine output improving. He did need session of HD on 8/25 for hyperkalemia. He is OOB, ambulating and tolerating a regular diet.   Subjective My job was scary   General Observations Sitting in chair, NAD, RN cleared for activity   Precautions/Limitations bed alarm;fall precautions;chair alarm    Prior Level of Functioning/Social History  Additional Comments Pt lives with family, apartment, elevator access, two STE, independent with ADLs and mobility    Vital Signs and Orthostatic Vital Signs  Vital Signs Vital Signs Stable    Pain/Comfort  Pain Comment (Pre/Post Treatment Pain) no c/o pain    Patient Coping  Observed Emotional State accepting    Cognition  Overall Cognitive Status WFL   Level of Consciousness alert    Vision/ Hearing  Vision Assessment Results No vision deficits noted    Range of Motion  Range of Motion Examination no ROM deficits were identified    Musculoskeletal  LUE Muscle Strength Grading 4-->active movement against gravity and resistance   RUE Muscle Strength Grading 4-->active movement against gravity and resistance   LLE Muscle Strength Grading 4-->active movement against gravity and resistance   RLE Muscle Strength Grading 4-->active movement against gravity and resistance    Muscle Tone  Muscle Tone Testing Results No muscle tone deficits noted    Coordination  Opposition WFL - Within Functional Limits    Sensory Assessment  Sensory Tests Results No sensory impairment noted    Skin Assessment  Skin Assessment See Nursing Documentation    Balance  Sitting Balance: Static  GOOD     Maintains static position against moderate resistance with no Assistive Device   Sitting Balance: Dynamic  GOOD    Independent in functional balance activities (ambulates on unlevel surfaces, turns, catches ball)   Standing Balance: Static GOOD     Maintains static position against moderate resistance with no Assistive Device   Standing Balance: Dynamic  GOOD    Independent in functional balance activities (ambulates on unlevel surfaces, turns, catches ball)   Balance Assist Device No device    Sit-Stand Transfer Training  Sit-to-Stand Transfer Independence/Assistance Level Complete independence   Sit-to-Stand Transfer Assist Device No device   Stand-to-Sit Transfer Independence/Assistance Level Complete independence   Stand-to-Sit Transfer Assist Device No device    Gait Training  Independence/Assistance Level  Complete independence   Assistive Device  No device   Gait Distance 400 feet;x2    Handoff Documentation  Handoff Patient in chair    Endurance  Endurance Comments good    AM-PAC - Basic Mobility Screen- How much help from another person do you currently need.....  Turning from your back to your side while in a a flat bed without using rails? 4 - None - Does not require any help and does the activity independently. Can use assistive devices.   Moving from lying on your back to sitting on the side of a flat bed without using bed  rails? 4 - None - Does not require any help and does the activity independently. Can use assistive devices.   Moving to and from a bed to a chair (including a wheelchair)? 4 - None - Does not require any help and does the activity independently. Can use assistive devices.   Standing up from a chair using your arms(e.g., wheelchair or bedside chair)? 4 - None - Does not require any help and does the activity independently. Can use assistive devices.   To walk in a hospital room? 4 - None - Does not require any help and does the activity independently. Can use assistive devices.   Climbing 3-5 steps with a railing? 4 - None - Does not require any help and does the activity independently. Can use assistive devices.   AMPAC Mobility Score 24   Highest Level of Mobility TARGET Mobility Level 8 Walk 250+ feet    Therapeutic Exercise  Therapeutic Exercise Comments Pt eduated on ther ex    Clinical Impression  Initial Assessment PT consulted for mobility assessment. Pt is able to tolerate ambulation without assist. He reports being at baseline mobility and is independent with ambulation. Pt no longer requires IP PT. He is safe for D/C home without follow up or DME. Please reconsult with change in status    Patient/Family Stated Goals  Patient/Family Stated Goal(s) return home;return to prior level of functioning    Frequency/Equipment Recommendations  PT Frequency Cleared    PT Recommendations for Inpatient Admission  Activity/Level of Assist out of bed;ambulate    PT Discharge Summary  Physical Therapy Disposition Recommendation Home   Additional Physical Therapy Disposition Recommendations No follow up therapy needs   Equipment Recommendations for Discharge No durable medical equipment needed     Milus Height PT, DPT [phone]

## 2019-11-08 NOTE — Plan of Care
Plan of Care Overview/ Patient Status    0700-1900 p.m.  Pt is alert and orient x4.  Pt is OOB, ambulating ad lib. VSS/FS monitored. IV in left hand patent. AVF in rt arm +/+ thrill/bruit. ABD incision RLQ staples CDI OTA.  JP draining SS fluid. Foley cath removed, spot void with no difficulty. BM this shift. Pt has good appetite. Minimal complaints of pain. Incentive spirometer encouraged. Fall/safety precautions maintained.

## 2019-11-08 NOTE — Progress Notes
[facility]	 [facility]	Transplant Nephrology Progress NoteS:Doing well this morningHas started to make urine 1.8L  S/p 120 mg of lasix. No complaints. No shortness of breath chest pain. Pain much improved. Looking forward to having the catheter off. Tolerated dialysis well. MEDICATIONS:Scheduled Meds:Current Facility-Administered Medications Medication Dose Route Frequency Provider Last Rate Last Admin ? acetaminophen (TYLENOL) tablet 650 mg  650 mg Oral Q6H Tommas Olp, MD   650 mg at 11/08/19 0548 ? calcium acetate(phosphat bind) (PHOSLO) capsule 667 mg  667 mg Oral TID WC Harris, Apphia, APRN   667 mg at 11/08/19 0811 ? fluconazole (DIFLUCAN) tablet 100 mg  100 mg Oral Daily Tommas Olp, MD   100 mg at 11/08/19 1610 ? heparin (porcine) injection 5,000 Units  5,000 Units Subcutaneous Damaris Schooner, MD   5,000 Units at 11/08/19 [phone] ? [START ON 11/09/2019] methylPREDNISolone PF (Solu-MEDROL) injection 80 mg  80 mg IV Push Daily Tommas Olp, MD      Followed by ? Melene Muller ON 11/10/2019] predniSONE (DELTASONE) tablet 10 mg  10 mg Oral Daily Tommas Olp, MD     ? mycophenolate mofetil (CELLCEPT) capsule 1 g  1 g Oral Q12H Tommas Olp, MD   1 g at 11/08/19 5409 ? pantoprazole (PROTONIX) EC tablet 40 mg  40 mg Oral Daily Mirasol, Joynell, APRN   40 mg at 11/08/19 8119 ? polyethylene glycol (MIRALAX) packet 17 g  17 g Oral Daily Mirasol, Joynell, APRN   17 g at 11/08/19 1478 ? senna-docusate (SENNA-PLUS) 8.6-50 mg per tablet 1 tablet  1 tablet Oral Nightly Mirasol, Joynell, APRN   1 tablet at 11/07/19 2104 ? [START ON 11/09/2019] sulfamethoxazole-trimethoprim (BACTRIM DS;CO-TRIMOXAZOLE DS) 800-160 mg per tablet 1 tablet  1 tablet Oral Once per day on Mon Wed Fri Sue Lush, APRN     ? tacrolimus (PROGRAF) immediate release capsule 7 mg  7 mg Oral Q12H (0600-1800) Harris, Apphia, APRN ? valGANciclovir (VALCYTE) tablet 450 mg  450 mg Oral Q Mon & Thurs (0900) Sue Lush, APRN   450 mg at 11/08/19 1139 Continuous Infusions:PRN Meds:.ondansetron **OR** ondansetron (PF), traMADoL, traMADoLPE:Vitals:  11/08/19 1134 BP: (!) 154/84 Pulse: 82 Resp: 20 Temp: 97.7 ?F (36.5 ?C) Gen: alert and oriented, NADCV: S1/S2 no m/r/gResp: CTABAbd: soft, incision approximated.  Ext: trace pedal edema. LABS:LabsRecent Labs Lab 08/24/210524 08/25/210535 08/26/210513 WBC 11.6* 13.8* 9.9 HGB 11.5* 11.0* 10.7* HCT 33.9* 33.0* 31.7* PLT 225 214 200  Recent Labs Lab 08/24/211818 08/25/210535 08/26/210513 08/26/211132 NA 132* 131* 137  --  K 5.4* 6.0* 4.9  --  CL 93* 92* 97*  --  CO2 18* 17* 23  --  BUN 54* 69* 56*  --  CREATININE 13.49* 14.26* 10.29*  --  GLU 136* 119* 114* 106* ANIONGAP 21* 22* 17  --  Recent Labs Lab 08/24/211818 08/25/210535 08/26/210513 CALCIUM 9.9 9.4 9.6 MG  --  2.2 2.2 PHOS  --  7.1* 6.8*  Recent Labs Lab 08/23/210055 PTT 26.0 LABPROT 11.4 INR 1.06  Recent Labs Lab 08/23/210055 ALT 18 AST 17 ALKPHOS 67 BILITOT 0.4  MICROBIOLOGY:reviewedRADIOLOGY:X-Ray Chest PA and LateralResult Date: 8/23/2021No acute radiographic finding. Reported And Signed By: Beverly Gust, MDUS Renal Transplant With DopplerResult Date: 8/23/2021Satisfactory appearances of the renal transplant. Increased velocity in the post anastomotic external iliac artery, with increased resistance. This may be secondary to normal postsurgical effects. Recommend attention on follow-up. Report Initiated By:  Jarrett Soho, MD Reported And Signed By: Earl Many, MDAssessment:Adam Harper is a 59 y.o.  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 Tolerated dialysis yesterday. I am hoping that he continues to have improvement in the graft function and not require further dialysis. Transplant on 11/05/2019 with Campath induction on steroid taper and MMF 1 g BID. cPRA 0%. CMV D+/R+, Donor is Toxo IgG + 0.2. Renal Plan:- Follow I/Os. - Continue to follow renal function. - Give lasix 120 mg, repeat BMP at noon. - Start Phoslo 667 mg TID with meals. - Can remove foley. - Will assess need for HD tomorrow. Immunosuppression- Campath induction per protocol- Steroid taper- MMF 1 g BID. - Tac 7 mg BID. Marland Kitchen - Fluconazole daily. - Start bactrim double dose MWF for PJP prophylaxis and underlying Toxo. - Will need to start CMV ppx in the upcoming days. Case was discussed with Dr. Revonda Standard. Please refer to his addendum for final recommendations. Kandee Keen Fellow8/26/2021After hours and on Weekends please contact the Renal Fellow on call (see AMION)

## 2019-11-08 NOTE — Plan of Care
Adam Harper			Location: [place] 1O/109-U6 y.o., male				Attending: Astrid Divine I*	Admit Date: 11/04/2019		[id number] LOS: 4 days TRANSPLANT NUTRITION ASSESSMENT CURRENT DIET ORDER: Modified Regular (No Added Salt, 2g K) Nutrition Note: Patient discussed at interdisciplinary rounds. Transplant nutrition has been following with medical team. Chart briefly reviewed, please see full nutrition assessment from 8/25. No HD required today, noted increase in UOP per I/Os. Met with pt and wife Byrd Hesselbach at bedside. Conversation facilitated by over the phone Spanish interpreter. Pt eating well and tolerating diet without N/V at this time. 2 BMs today. Many snacks at bedside. Suspect pt able to meet estimated needs at this time with ongoing good appetite and PO intake. Labs reviewed. Serum sodium, magnesium WNL. Serum potassium now WNL at 4.74mmol/L following HD treatment yesterday for hyperkalemia. Serum phosphorus remains elevated at 6.8 mg/dL, down from 7.1 mg/dL yesterday, pt restarted on phoslo. FSBG range 104-140mg /dL over the past 47WGN. No insulin at this time. INTERVENTIONS/RECOMMENDATIONS/PLAN: Meal and Snacks: ?	Agree with current diet at this time?	Continue to encourage small frequent meals of high calorie/protein foodsMedical Food Supplements: ?	Commercial beverage: not indicated at this timeNutrition-related medication management: ?	Continue phoslo for hyperphosphatemia ?	Bowel regimen per teamNutrition Education?	Diet education provided on post transplant diet including food safety principles, potential need to make future diet alterations based on laboratory values, and encouraged frequent kcal/pro rich PO intakes in immediate post operative period to help meet elevated needs for healing. Emphasized importance of weight maintenance to protect the health of allograft kidney. Patient verbalized understanding and has no barriers to diet suggestions. Patient has transplant nutrition's contact information and was educated to call if questions or further nutrition needs arise. Goals:?	Patient will attempt to consume >75% of three meals per day by next nutrition visit- status: goal met, continueDischarge and Transfer of Nutrition Care to New Setting or Provider?	Diet advanced, patient tolerating. No further nutrition needs noted at this time. Patient has transplant nutrition's contact information and was educated to call if questions or nutrition needs arise.?	No nutritional barriers to discharge identified.MONITORING / EVALUATION:?	Will continue to follow and adjust nutrition plan as needed/consulted. Newton Pigg, RDTransplant Dietitian Mobile Heartbeat: [phone] Dynamic Role TXP RD: [phone] note: Nutrition is a consult only service on weekends and holidays.  Please enter a consult in EPIC if assistance is needed on a weekend or holiday or call [phone] to be directed to covering RD.

## 2019-11-08 NOTE — Progress Notes
[facility]	 [facility]	Transplant Nephrology Progress NoteS:Doing well this morningHas started to make urine 460 cc of urine. Complaint of pain  Received oxy with good effect. No shortness of breath. MEDICATIONS:Scheduled Meds:Current Facility-Administered Medications Medication Dose Route Frequency Provider Last Rate Last Admin ? acetaminophen (TYLENOL) tablet 650 mg  650 mg Oral Q6H Tommas Olp, MD   650 mg at 11/06/19 1107 ? famotidine (PEPCID) tablet 20 mg  20 mg Oral Daily Tommas Olp, MD   20 mg at 11/06/19 0825 ? fluconazole (DIFLUCAN) tablet 100 mg  100 mg Oral Daily Tommas Olp, MD   100 mg at 11/06/19 0825 ? heparin (porcine) injection 5,000 Units  5,000 Units Subcutaneous Damaris Schooner, MD   5,000 Units at 11/06/19 0543 ? lidocaine (XYLOCAINE) 2 % jelly   Topical (Top) Once Mirasol, Joynell, APRN     ? [START ON 11/07/2019] methylPREDNISolone PF (Solu-MEDROL) 160 mg in sodium chloride 0.9% 50 mL IVPB  160 mg Intravenous Daily Tommas Olp, MD      Followed by ? Melene Muller ON 11/08/2019] methylPREDNISolone PF (Solu-MEDROL) injection 120 mg  120 mg IV Push Daily Tommas Olp, MD      Followed by ? Melene Muller ON 11/09/2019] methylPREDNISolone PF (Solu-MEDROL) injection 80 mg  80 mg IV Push Daily Tommas Olp, MD      Followed by ? Melene Muller ON 11/10/2019] predniSONE (DELTASONE) tablet 10 mg  10 mg Oral Daily Tommas Olp, MD     ? mycophenolate mofetil (CELLCEPT) capsule 1 g  1 g Oral Q12H Tommas Olp, MD   1 g at 11/06/19 0825 ? polyethylene glycol (MIRALAX) packet 17 g  17 g Oral Daily Mirasol, Joynell, APRN   17 g at 11/06/19 0825 ? senna-docusate (SENNA-PLUS) 8.6-50 mg per tablet 1 tablet  1 tablet Oral Nightly Mirasol, Joynell, APRN     Continuous Infusions:? sodium chloride 50 mL/hr (11/06/19 0925) PRN Meds:.ondansetron **OR** ondansetron (PF), traMADoL, traMADoLPE:Vitals:  11/06/19 1117 BP: (!) 143/80 Pulse: 88 Resp: 18 Temp: 97.4 ?F (36.3 ?C) Gen: alert and oriented, NADCV: S1/S2 no m/r/gResp: CTABAbd: soft, drain in place dressing in place not soaking through Ext: trace pedal edema. LABS:LabsRecent Labs Lab 08/23/210055 08/23/211805 08/24/210524 WBC 5.0 9.4 11.6* HGB 10.2* 10.7* 11.5* HCT 30.0* 31.4* 33.9* PLT 219 221 225  Recent Labs Lab 08/23/210055 08/23/211805 08/24/210524 08/24/210755 NA 140 135* 132*  --  K 4.8 4.4 5.5*  --  CL 94* 96* 94*  --  CO2 26 21 19*  --  BUN 60* 31* 45*  --  CREATININE 17.07* 11.20* 12.55*  --  GLU 104* 125* 124* 124* ANIONGAP 20* 18* 19*  --  Recent Labs Lab 08/23/210055 08/23/211805 08/24/210524 CALCIUM 11.3* 9.4 9.2 MG 2.2 1.8 1.9 PHOS 8.4* 4.2 4.2  Recent Labs Lab 08/23/210055 PTT 26.0 LABPROT 11.4 INR 1.06  Recent Labs Lab 08/23/210055 ALT 18 AST 17 ALKPHOS 67 BILITOT 0.4  MICROBIOLOGY:reviewedRADIOLOGY:X-Ray Chest PA and LateralResult Date: 8/23/2021No acute radiographic finding. Reported And Signed By: Beverly Gust, MDUS Renal Transplant With DopplerResult Date: 8/23/2021Satisfactory appearances of the renal transplant. Increased velocity in the post anastomotic external iliac artery, with increased resistance. This may be secondary to normal postsurgical effects. Recommend attention on follow-up. Report Initiated By:  Jarrett Soho, MD Reported And Signed By: Earl Many, MDAssessment:Adam Harper is a 59 y.o.  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 His K was slightly high but suspect that this would get better given increasing urine output. Transplant on 11/05/2019 with Campath induction on steroid taper and MMF 1 g BID. cPRA 0%. CMV D+/R+, Donor is Toxo IgG + unknown status for recipient. Renal Plan:- Follow I/Os. - Continue to follow renal function. - Give lasix 120 mg, repeat BMP at noon. - Currently no indication for dialysis. Immunosuppression- Campath induction per protocol- Steroid taper- MMF 1 g BID. - Will determine initiation of tac based on BMP. - Fluconazole daily. - Awaiting recipient toxo IgG. - Will need to consider PJP prophylaxis in the oncoming days. - Will need to start CMV ppx in the upcoming days. Case was discussed with Dr. Revonda Standard. Please refer to his addendum for final recommendations. Kandee Keen Fellow8/24/2021After hours and on Weekends please contact the Renal Fellow on call (see AMION)

## 2019-11-08 NOTE — Plan of Care
Plan of Care Overview/ Patient Status    SOCIAL WORK NOTEPatient Name: RAYDEN DOCK Record Number: [id number] Date of Birth: [birth year]Social Work Follow Up    Most Recent Value Document Type Progress Note Reason for Encounter Care Decision Concerns/Lack of Decision-Maker Intervention Decision-Making/Care Competence Assessment & Resolution Decision-Making/Care Competence Assessment & Resolution Psycho-education, Transition/goals of care Source of Information Patient, Medical Team Level of Care Inpatient Social Work Cluster Transplant Identified Clinical/Disposition, Issues/Barriers: Mr. Braver is post operative day 3 from a deceased donor kidney transplant.  Social Work is following to provide concrete and emotional psychosocial support. Intervention(s)/Summary 10 minutes spent with Mr. Liew earlier today. Conversation facilitated by [employer] interpreter.  Mr. Celestin is generally brief in responses, but appears to be coping adequately within context of delayed graft function and requiring dialysis yesterday.  He denies acute psychosocial concerns and reports wife will be visiting this afternoon. Outcome Ongoing Interventions Handoff Required? No Next Steps/Plan (including hand-off): -Will continue to follow to provide psychosocial support -No anticipated psychosocial barriers to discharge Signature: Silas Sedam, lcswKidney Transplant Service  Contact Information: [phone]

## 2019-11-09 ENCOUNTER — Inpatient Hospital Stay: Admit: 2019-11-09 | Payer: PRIVATE HEALTH INSURANCE | Attending: Diagnostic Radiology

## 2019-11-09 DIAGNOSIS — N186 End stage renal disease: Secondary | ICD-10-CM

## 2019-11-09 DIAGNOSIS — N051 Unspecified nephritic syndrome with focal and segmental glomerular lesions: Secondary | ICD-10-CM

## 2019-11-09 LAB — BASIC METABOLIC PANEL
BKR ANION GAP: 19 — ABNORMAL HIGH (ref 7–17)
BKR BLOOD UREA NITROGEN: 80 mg/dL — ABNORMAL HIGH (ref 6–20)
BKR BUN / CREAT RATIO: 7 — ABNORMAL LOW (ref 8.0–23.0)
BKR CALCIUM: 9.4 mg/dL (ref 8.8–10.2)
BKR CHLORIDE: 94 mmol/L — ABNORMAL LOW (ref 98–107)
BKR CO2: 22 mmol/L (ref 20–30)
BKR CREATININE: 11.47 mg/dL — ABNORMAL HIGH (ref 0.40–1.30)
BKR EGFR (AFR AMER): 6 mL/min/{1.73_m2} (ref >60)
BKR EGFR (NON AFRICAN AMERICAN): 5 mL/min/{1.73_m2} (ref >60)
BKR GLUCOSE: 112 mg/dL — ABNORMAL HIGH (ref 70–100)
BKR POTASSIUM: 4.6 mmol/L (ref 3.3–5.1)
BKR SODIUM: 135 mmol/L — ABNORMAL LOW (ref 136–144)

## 2019-11-09 LAB — CBC WITH AUTO DIFFERENTIAL
BKR WAM ABSOLUTE IMMATURE GRANULOCYTES: 0.1 x 1000/??L — ABNORMAL LOW (ref 0.0–0.3)
BKR WAM ABSOLUTE LYMPHOCYTE COUNT: 0 x 1000/??L — ABNORMAL LOW (ref 1.0–4.0)
BKR WAM ABSOLUTE NRBC: 0 x 1000/ÂµL (ref 0.0–0.0)
BKR WAM ANALYZER ANC: 8.2 x 1000/??L — ABNORMAL HIGH (ref 1.0–11.0)
BKR WAM BASOPHIL ABSOLUTE COUNT: 0 x 1000/ÂµL (ref 0.0–0.0)
BKR WAM BASOPHILS: 0 % (ref 0.0–4.0)
BKR WAM EOSINOPHIL ABSOLUTE COUNT: 0 x 1000/ÂµL (ref 0.0–1.0)
BKR WAM EOSINOPHILS: 0 % (ref 0.0–7.0)
BKR WAM HEMATOCRIT: 33 % — ABNORMAL LOW (ref 37.0–52.0)
BKR WAM HEMOGLOBIN: 11 g/dL — ABNORMAL LOW (ref 12.0–18.0)
BKR WAM IMMATURE GRANULOCYTES: 1.3 % (ref 0.0–3.0)
BKR WAM LYMPHOCYTES: 0.2 % — ABNORMAL LOW (ref 8.0–49.0)
BKR WAM MCH (PG): 35.3 pg — ABNORMAL HIGH (ref 27.0–31.0)
BKR WAM MCHC: 33.3 g/dL (ref 31.0–36.0)
BKR WAM MCV: 105.8 fL — ABNORMAL HIGH (ref 78.0–94.0)
BKR WAM MONOCYTES: 5.6 % — ABNORMAL HIGH (ref 4.0–15.0)
BKR WAM MPV: 10.2 fL (ref 6.0–11.0)
BKR WAM NEUTROPHILS: 92.9 % — ABNORMAL HIGH (ref 37.0–84.0)
BKR WAM NUCLEATED RED BLOOD CELLS: 0 % (ref 0.0–1.0)
BKR WAM PLATELETS: 202 x1000/ÂµL (ref 140–440)
BKR WAM RDW-CV: 13.8 % — ABNORMAL HIGH (ref 11.5–14.5)
BKR WAM RED BLOOD CELL COUNT: 3.1 M/??L — ABNORMAL LOW (ref 3.8–5.9)
BKR WAM WHITE BLOOD CELL COUNT: 8.8 x1000/??L (ref 4.0–10.0)

## 2019-11-09 LAB — MAGNESIUM: BKR MAGNESIUM: 2 mg/dL (ref 1.7–2.4)

## 2019-11-09 LAB — PHOSPHORUS     (BH GH L LMW YH): BKR PHOSPHORUS: 7.3 mg/dL — ABNORMAL HIGH (ref 2.2–4.5)

## 2019-11-09 LAB — TACROLIMUS LEVEL     (BH GH L LMW YH): BKR TACROLIMUS BLOOD: 4.3 ng/mL — ABNORMAL LOW

## 2019-11-09 MED ORDER — SODIUM CHLORIDE 0.9 % (FLUSH) INJECTION SYRINGE
0.9 % | Status: DC | PRN
Start: 2019-11-09 — End: 2019-11-11

## 2019-11-09 MED ORDER — CARVEDILOL IMMEDIATE RELEASE 12.5 MG TABLET
12.5 mg | Freq: Two times a day (BID) | ORAL | Status: DC
Start: 2019-11-09 — End: 2019-11-11
  Administered 2019-11-09 – 2019-11-10 (×2): 12.5 mg via ORAL

## 2019-11-09 MED ORDER — SODIUM CHLORIDE 0.9 % (FLUSH) INJECTION SYRINGE
0.9 % | Freq: Two times a day (BID) | Status: DC
Start: 2019-11-09 — End: 2019-11-11
  Administered 2019-11-10 (×2): 0.9 mL

## 2019-11-09 MED ORDER — METOPROLOL TARTRATE 5 MG/5 ML IV
55 mg/ mL | Freq: Once | INTRAVENOUS | Status: DC
Start: 2019-11-09 — End: 2019-11-09

## 2019-11-09 NOTE — Plan of Care
Plan of Care Overview/ Patient Status    1900-0700: Pt was alert and oriented x4, w/o pain as reported by patient. Spanish preferred, some english spoken by pt.  BP was elevated multiple times of the shift. MD Reece Leader notified, came to beside for reassessment just before lopressor was no longer indicated. His SBP/DBP decreased on its own. Pt slept in chair, CPAP not hooked up because patient did not want to close the door in order to have nasal mask set up. Voided frequently and independently straw colored yellow urine. 1 bm reported, but he could not remember the time of occurrence.

## 2019-11-09 NOTE — Plan of Care
Plan of Care Overview/ Patient Status    Confirmed with [facility] facility pt has HD chair reserved M W F 645 am.[place] [facility] RN, BSNCare ManagerYale Silver City HospitalMHB [phone]

## 2019-11-09 NOTE — Plan of Care
Plan of Care Overview/ Patient Status    0700-1900 p.m. Pt is alert and orient x 4.  OOB and shower this morning. VSS/FS monitored. All po meds taken. Pt encouraged to ambulate unit. JP drain intact. IV infiltrated and removed. Asked hospitalist to please attempt to place new iv with ultrasound. Staples are CDI. Spot void. BM this morning. Appetite is good. Napping on and off. Medications reviewed with patient. Fall/safety precautions maintained.

## 2019-11-09 NOTE — Plan of Care
Plan of Care Overview/ Patient Status    NPN: Pt A&Ox4. SBP elevated in 180s, dropped to 160s after administering BP meds. Otherwise VSS on RA. Pt blood sugar elevated at 183 but asymptomatic. MD aware. Down to 149 on re-check, and MD advised to monitor blood glucose trend. US guided permacath IV placed on R arm, dry and intact. Jp on RLQ draining. Pt had 1 BM today. Good appetite and po intake. Spont void. Independent. Cr elevated this AM on labs but downtrending; team discussed possible discharge tomorrow if pt stable. Fall precautions in place. Will continue to monitor above problems. See flowsheet for details.

## 2019-11-09 NOTE — Progress Notes
[place]	 [facility]	Transplant Nephrology Progress NoteS:Doing well this morningHas started to make urine 1.9 L plus 2 occurrences. No complaints today. MEDICATIONS:Scheduled Meds:Current Facility-Administered Medications Medication Dose Route Frequency Provider Last Rate Last Admin ? acetaminophen (TYLENOL) tablet 650 mg  650 mg Oral Q6H Tommas Olp, MD   650 mg at 11/09/19 4098 ? amLODIPine (NORVASC) tablet 10 mg  10 mg Oral Daily Jari Sportsman, MD   10 mg at 11/09/19 0844 ? calcium acetate(phosphat bind) (PHOSLO) capsule 667 mg  667 mg Oral TID WC Harris, Apphia, APRN   667 mg at 11/09/19 0844 ? fluconazole (DIFLUCAN) tablet 100 mg  100 mg Oral Daily Tommas Olp, MD   100 mg at 11/09/19 0844 ? heparin (porcine) injection 5,000 Units  5,000 Units Subcutaneous Damaris Schooner, MD   5,000 Units at 11/09/19 [phone] ? metoprolol tartrate (LOPRESSOR) injection 5 mg  5 mg IV Push Once Jari Sportsman, MD     ? mycophenolate mofetil (CELLCEPT) capsule 1 g  1 g Oral Q12H Tommas Olp, MD   1 g at 11/09/19 4782 ? pantoprazole (PROTONIX) EC tablet 40 mg  40 mg Oral Daily Mirasol, Joynell, APRN   40 mg at 11/09/19 0844 ? polyethylene glycol (MIRALAX) packet 17 g  17 g Oral Daily Mirasol, Joynell, APRN   17 g at 11/09/19 0843 ? [START ON 11/10/2019] predniSONE (DELTASONE) tablet 10 mg  10 mg Oral Daily Tommas Olp, MD     ? senna-docusate (SENNA-PLUS) 8.6-50 mg per tablet 1 tablet  1 tablet Oral Nightly Mirasol, Joynell, APRN   1 tablet at 11/08/19 2037 ? sulfamethoxazole-trimethoprim (BACTRIM DS;CO-TRIMOXAZOLE DS) 800-160 mg per tablet 1 tablet  1 tablet Oral Once per day on Mon Wed Fri Harris, Apphia, APRN   1 tablet at 11/09/19 9562 ? tacrolimus (PROGRAF) immediate release capsule 7 mg  7 mg Oral Q12H (0600-1800) Harris, Apphia, APRN   7 mg at 11/09/19 1308 ? valGANciclovir (VALCYTE) tablet 450 mg  450 mg Oral Q Mon & Thurs (0900) Sue Lush, APRN   450 mg at 11/08/19 1139 Continuous Infusions:PRN Meds:.ondansetron **OR** ondansetron (PF), traMADoL, traMADoLPE:Vitals:  11/09/19 0804 BP: (!) 152/86 Pulse: 75 Resp: 20 Temp: 97.4 ?F (36.3 ?C) Gen: alert and oriented, NADCV: S1/S2 no m/r/gResp: CTABAbd: soft, incision approximated.  Ext: trace pedal edema. LABS:LabsRecent Labs Lab 08/25/210535 08/26/210513 08/27/210531 WBC 13.8* 9.9 8.8 HGB 11.0* 10.7* 11.0* HCT 33.0* 31.7* 33.0* PLT 214 200 202  Recent Labs Lab 08/25/210535 08/26/210513 08/27/210531 08/27/210802 NA 131* 137 135*  --  K 6.0* 4.9 4.6  --  CL 92* 97* 94*  --  CO2 17* 23 22  --  BUN 69* 56* 80*  --  CREATININE 14.26* 10.29* 11.47*  --  GLU 119* 114* 112* 91 ANIONGAP 22* 17 19*  --  Recent Labs Lab 08/25/210535 08/26/210513 08/27/210531 CALCIUM 9.4 9.6 9.4 MG 2.2 2.2 2.0 PHOS 7.1* 6.8* 7.3*  Recent Labs Lab 08/23/210055 PTT 26.0 LABPROT 11.4 INR 1.06  Recent Labs Lab 08/23/210055 ALT 18 AST 17 ALKPHOS 67 BILITOT 0.4  Results for Adam Harper, Adam Harper (MRN [id number]) as of 11/09/2019 11:22 Ref. Range 11/09/2019 05:31 Tacrolimus Lvl Latest Ref Range: See Comment ng/mL 4.3 (L) MICROBIOLOGY:reviewedRADIOLOGY:X-Ray Chest PA and LateralResult Date: 8/23/2021No acute radiographic finding. Reported And Signed By: Beverly Gust, MDUS Renal Transplant With DopplerResult Date: 8/23/2021Satisfactory appearances of the renal transplant. Increased velocity in the post anastomotic external iliac artery, with increased resistance. This may be secondary to normal postsurgical effects.  Recommend attention on follow-up. Report Initiated By:  Jarrett Soho, MD Reported And Signed By: Earl Many, MDAssessment:Adam Harper is a 59 y.o.  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 It appears that his graft function starting to pick up.  I informed that he will continue to show signs of improved graft function and ultimately not require dialysis.  For today he does not need dialysis will evaluate need for hemodialysis tomorrow.  Intact level had been low we had increased did and currently is increasing.  Will keep the same dose.Transplant on 11/05/2019 with Campath induction on steroid taper and MMF 1 g BID. cPRA 0%. CMV D+/R+, Donor is Toxo IgG + 0.2. Renal Plan:- Follow I/Os. - Continue to follow renal function. - monitor off diuretics. - increase PhosLo to 1334 mg TID with meals. - Will assess need for HD tomorrow. Immunosuppression- Campath induction per protocol- Steroid taper- MMF 1 g BID. - Tac 7 mg BID. Marland Kitchen - Fluconazole daily. - Start bactrim double dose MWF for PJP prophylaxis and underlying Toxo. - Will need to start CMV ppx in the upcoming days. Case was discussed with Dr. Revonda Standard. Please refer to his addendum for final recommendations. Kandee Keen Fellow8/27/2021After hours and on Weekends please contact the Renal Fellow on call (see AMION)

## 2019-11-09 NOTE — Procedures
Limb restriction: RightCalled to see patient for lack of IV access.  U/S used to identify vessel which was completely compressible and non-pulsatile. 20g extended-length Endurance IV placed under direct U/S guidance into left basilic.  Positive blood return, easily flushed.  Line can be used for blood draws, though technically not an indication per manufacturer.  Recommend flushing with 20cc saline after each blood draw to extend time period of blood return; placed flush orders in [facility].Please reach me on Mobile Heartbeat before 6pm with any questions. Lovette Cliche, APRNProceduralist, Internal Medicine [facility]: [phone]

## 2019-11-09 NOTE — Discharge Instructions
DISCHARGE INSTRUCTIONS:DIET: Regular diet.  You may be more comfortable eating small frequent meals.  Drink plenty of fluids. ACTIVITY: No bathing in tub, using Jacuzzi or swimming until cleared by your surgeon, usually at your 2 week post operative visit. You may resume showering, walking, climbing stairs. No heavy lifting (greater than 10 pounds or two gallons of milk) for six weeks. Aerobic activity is permitted  (i.e. walking and treadmill).   Be out of bed during the day, except for a short nap or rest period.  Do not drive while taking narcotic pain medication. INCISION CARE: Your incisions were closed with surgical glue or steristrips. You may shower, let water/soap run from your incisions and pat them dry. Do not scrub incision, apply lotions/creams to incisions or area around incisions. If your surgery required a drain, the drain site may require a small bandage for the first few days after the procedure. The drainage should steadily decrease and stop over next few days. You should change this bandage as needed and after showering until drainage stops. MEDICATIONS: Please take Colace as directed to soften stool and prevent constipation while on narcotic pain medication.FOLLOW WJ:XBJYNWG and record your BP/HR a few times a day. Bring your vital signs log to your clinic visit. Record your weight.On the day of your visit, arrive 30-45 minutes early for your blood work (done on the 2nd floor of the same building).  Do not take your prograf before your blood is drawn.On the day of your appointment please DO NOT take your tacrolimus until your labs have been drawn. Bring your medication to clinic so you may take your tacrolimus after your lab appointment.PLEASE CALL TRANSPLANT CLINIC at [phone] if you experience any of the following: Increased redness, swelling, or foul-smelling discharge at incision site Temperature greater than 100.56F Persistent nausea, vomiting, abdominal pain or inability to tolerate fluids Persistent drainage from wounds Shortness of breath

## 2019-11-10 DIAGNOSIS — E785 Hyperlipidemia, unspecified: Secondary | ICD-10-CM

## 2019-11-10 DIAGNOSIS — N2581 Secondary hyperparathyroidism of renal origin: Secondary | ICD-10-CM

## 2019-11-10 DIAGNOSIS — E7800 Pure hypercholesterolemia, unspecified: Secondary | ICD-10-CM

## 2019-11-10 DIAGNOSIS — E213 Hyperparathyroidism, unspecified: Secondary | ICD-10-CM

## 2019-11-10 DIAGNOSIS — Z992 Dependence on renal dialysis: Secondary | ICD-10-CM

## 2019-11-10 DIAGNOSIS — N289 Disorder of kidney and ureter, unspecified: Secondary | ICD-10-CM

## 2019-11-10 DIAGNOSIS — I120 Hypertensive chronic kidney disease with stage 5 chronic kidney disease or end stage renal disease: Secondary | ICD-10-CM

## 2019-11-10 DIAGNOSIS — N186 End stage renal disease: Secondary | ICD-10-CM

## 2019-11-10 DIAGNOSIS — G4733 Obstructive sleep apnea (adult) (pediatric): Secondary | ICD-10-CM

## 2019-11-10 DIAGNOSIS — N269 Renal sclerosis, unspecified: Secondary | ICD-10-CM

## 2019-11-10 DIAGNOSIS — Z6839 Body mass index (BMI) 39.0-39.9, adult: Secondary | ICD-10-CM

## 2019-11-10 DIAGNOSIS — K7460 Unspecified cirrhosis of liver: Secondary | ICD-10-CM

## 2019-11-10 DIAGNOSIS — Z6838 Body mass index (BMI) 38.0-38.9, adult: Secondary | ICD-10-CM

## 2019-11-10 DIAGNOSIS — E875 Hyperkalemia: Secondary | ICD-10-CM

## 2019-11-10 DIAGNOSIS — K219 Gastro-esophageal reflux disease without esophagitis: Secondary | ICD-10-CM

## 2019-11-10 DIAGNOSIS — Z87891 Personal history of nicotine dependence: Secondary | ICD-10-CM

## 2019-11-10 LAB — BASIC METABOLIC PANEL
BKR ANION GAP: 23 x1000/??L — ABNORMAL HIGH (ref 7–17)
BKR BLOOD UREA NITROGEN: 95 mg/dL — ABNORMAL HIGH (ref 6–20)
BKR BUN / CREAT RATIO: 8.7 g/dL (ref 8.0–23.0)
BKR CALCIUM: 9 mg/dL (ref 8.8–10.2)
BKR CHLORIDE: 93 mmol/L — ABNORMAL LOW (ref 98–107)
BKR CO2: 17 mmol/L — ABNORMAL LOW (ref 20–30)
BKR CREATININE: 10.94 mg/dL — ABNORMAL HIGH (ref 0.40–1.30)
BKR EGFR (AFR AMER): 6 mL/min/{1.73_m2} (ref >60)
BKR EGFR (NON AFRICAN AMERICAN): 5 mL/min/{1.73_m2} (ref >60)
BKR GLUCOSE: 107 mg/dL — ABNORMAL HIGH (ref 70–100)
BKR POTASSIUM: 5 mmol/L (ref 3.3–5.1)
BKR SODIUM: 133 mmol/L — ABNORMAL LOW (ref 136–144)

## 2019-11-10 LAB — CBC WITH AUTO DIFFERENTIAL
BKR WAM ABSOLUTE IMMATURE GRANULOCYTES: 0.1 x 1000/??L (ref 0.0–0.3)
BKR WAM ABSOLUTE LYMPHOCYTE COUNT: 0 x 1000/??L — ABNORMAL LOW (ref 1.0–4.0)
BKR WAM ABSOLUTE NRBC: 0 x 1000/??L (ref 0.0–0.0)
BKR WAM ANALYZER ANC: 7.8 x 1000/??L (ref 1.0–11.0)
BKR WAM BASOPHIL ABSOLUTE COUNT: 0 x 1000/??L (ref 0.0–0.0)
BKR WAM BASOPHILS: 0.1 % (ref 0.0–4.0)
BKR WAM EOSINOPHIL ABSOLUTE COUNT: 0 x 1000/??L (ref 0.0–1.0)
BKR WAM EOSINOPHILS: 0.1 % (ref 0.0–7.0)
BKR WAM HEMATOCRIT: 31.5 % — ABNORMAL LOW (ref 37.0–52.0)
BKR WAM HEMOGLOBIN: 11.1 g/dL — ABNORMAL LOW (ref 12.0–18.0)
BKR WAM IMMATURE GRANULOCYTES: 0.8 % (ref 0.0–3.0)
BKR WAM LYMPHOCYTES: 0.5 % — ABNORMAL LOW (ref 8.0–49.0)
BKR WAM MCH (PG): 36.5 pg — ABNORMAL HIGH (ref 27.0–31.0)
BKR WAM MCHC: 35.2 g/dL (ref 31.0–36.0)
BKR WAM MCV: 103.6 fL — ABNORMAL HIGH (ref 78.0–94.0)
BKR WAM MONOCYTE ABSOLUTE COUNT: 0.4 x 1000/??L (ref 0.0–2.0)
BKR WAM MONOCYTES: 4.9 % (ref 4.0–15.0)
BKR WAM MPV: 10.4 fL (ref 6.0–11.0)
BKR WAM NEUTROPHILS: 93.6 % — ABNORMAL HIGH (ref 37.0–84.0)
BKR WAM NUCLEATED RED BLOOD CELLS: 0 % (ref 0.0–1.0)
BKR WAM PLATELETS: 225 x1000/??L (ref 140–440)
BKR WAM RDW-CV: 13.6 % (ref 11.5–14.5)
BKR WAM RED BLOOD CELL COUNT: 3 M/ÂµL — ABNORMAL LOW (ref 3.8–5.9)
BKR WAM WHITE BLOOD CELL COUNT: 8.4 x1000/ÂµL (ref 4.0–10.0)

## 2019-11-10 LAB — TACROLIMUS LEVEL     (BH GH L LMW YH): BKR TACROLIMUS BLOOD: 5.6 ng/mL

## 2019-11-10 LAB — PHOSPHORUS     (BH GH L LMW YH): BKR PHOSPHORUS: 7.2 mg/dL — ABNORMAL HIGH (ref 2.2–4.5)

## 2019-11-10 LAB — MAGNESIUM: BKR MAGNESIUM: 2.3 mg/dL (ref 1.7–2.4)

## 2019-11-10 MED ORDER — VALGANCICLOVIR 450 MG TABLET
450 mg | ORAL_TABLET | ORAL | 3 refills | 30.00 days | Status: AC
Start: 2019-11-10 — End: 2019-11-26
  Filled 2019-11-10: qty 60, 30d supply, fill #1
  Filled 2019-11-10: qty 9, 30d supply, fill #0

## 2019-11-10 MED ORDER — ZZ IMS TEMPLATE
Freq: Two times a day (BID) | ORAL | Status: DC
Start: 2019-11-10 — End: 2019-11-11

## 2019-11-10 MED ORDER — TACROLIMUS IMMEDIATE RELEASE 1 MG CAPSULE
1 mg | ORAL_CAPSULE | Freq: Two times a day (BID) | ORAL | 12 refills | 30.00 days | Status: AC
Start: 2019-11-10 — End: 2019-11-12
  Filled 2019-11-10: qty 480, 30d supply, fill #1
  Filled 2019-11-10: qty 480, 30d supply, fill #0

## 2019-11-10 MED ORDER — CARVEDILOL IMMEDIATE RELEASE 12.5 MG TABLET
12.5 mg | ORAL_TABLET | Freq: Two times a day (BID) | ORAL | 12 refills | 30.00 days | Status: AC
Start: 2019-11-10 — End: 2019-11-12

## 2019-11-10 MED ORDER — PREDNISONE 5 MG TABLET
5 mg | ORAL_TABLET | Freq: Every day | ORAL | 12 refills | 30.00 days | Status: AC
Start: 2019-11-10 — End: 2019-12-14
  Filled 2019-11-10: qty 60, 30d supply, fill #0
  Filled 2019-11-10: qty 60, 30d supply, fill #1

## 2019-11-10 MED ORDER — CARVEDILOL IMMEDIATE RELEASE 12.5 MG TABLET
12.5 mg | ORAL_TABLET | Freq: Two times a day (BID) | ORAL | 12 refills | 30.00 days | Status: AC
Start: 2019-11-10 — End: 2019-11-10

## 2019-11-10 MED ORDER — ATORVASTATIN 40 MG TABLET
40 mg | ORAL_TABLET | Freq: Every day | ORAL | 6 refills | 30.00 days | Status: AC
Start: 2019-11-10 — End: 2020-04-25
  Filled 2019-11-10: qty 30, 30d supply, fill #6
  Filled 2019-11-10: qty 30, 30d supply, fill #0

## 2019-11-10 MED ORDER — ACETAMINOPHEN 325 MG TABLET
325 mg | ORAL_TABLET | Freq: Four times a day (QID) | ORAL | 12 refills | Status: AC | PRN
Start: 2019-11-10 — End: ?

## 2019-11-10 MED ORDER — SENNOSIDES 8.6 MG-DOCUSATE SODIUM 50 MG TABLET
Freq: Every evening | ORAL | Status: SS
Start: 2019-11-10 — End: 2019-11-20

## 2019-11-10 NOTE — Progress Notes
[place]	 [facility]	Transplant Nephrology Progress NoteS:No issues overnight. Seen at bedisde feels well.Continues to make a good amount of urineMEDICATIONS:Scheduled Meds:Current Facility-Administered Medications Medication Dose Route Frequency Provider Last Rate Last Admin ? acetaminophen (TYLENOL) tablet 650 mg  650 mg Oral Q6H Tommas Olp, MD   650 mg at 11/10/19 0640 ? amLODIPine (NORVASC) tablet 10 mg  10 mg Oral Daily Jari Sportsman, MD   10 mg at 11/09/19 0844 ? calcium acetate(phosphat bind) (PHOSLO) capsule 667 mg  667 mg Oral TID WC Harris, Apphia, APRN   667 mg at 11/09/19 1827 ? carvediloL (COREG) Immediate Release tablet 12.5 mg  12.5 mg Oral BID WC Mirasol, Joynell, APRN   12.5 mg at 11/09/19 1828 ? fluconazole (DIFLUCAN) tablet 100 mg  100 mg Oral Daily Tommas Olp, MD   100 mg at 11/09/19 0844 ? heparin (porcine) injection 5,000 Units  5,000 Units Subcutaneous Damaris Schooner, MD   5,000 Units at 11/10/19 [phone] ? mycophenolate mofetil (CELLCEPT) capsule 1 g  1 g Oral Q12H Tommas Olp, MD   1 g at 11/09/19 2120 ? pantoprazole (PROTONIX) EC tablet 40 mg  40 mg Oral Daily Mirasol, Joynell, APRN   40 mg at 11/09/19 0844 ? polyethylene glycol (MIRALAX) packet 17 g  17 g Oral Daily Mirasol, Joynell, APRN   17 g at 11/09/19 0843 ? predniSONE (DELTASONE) tablet 10 mg  10 mg Oral Daily Tommas Olp, MD     ? senna-docusate (SENNA-PLUS) 8.6-50 mg per tablet 1 tablet  1 tablet Oral Nightly Mirasol, Joynell, APRN   1 tablet at 11/09/19 2120 ? sodium chloride 0.9 % flush 10 mL  10 mL Intra-Catheter Q12H Kelly-Hauser, Jessica A, APRN   10 mL at 11/09/19 2119 ? sulfamethoxazole-trimethoprim (BACTRIM DS;CO-TRIMOXAZOLE DS) 800-160 mg per tablet 1 tablet  1 tablet Oral Once per day on Mon Wed Fri Harris, Apphia, APRN   1 tablet at 11/09/19 9604 ? tacrolimus (PROGRAF) immediate release capsule 7 mg  7 mg Oral Q12H (0600-1800) Harris, Apphia, APRN   7 mg at 11/10/19 0640 ? valGANciclovir (VALCYTE) tablet 450 mg  450 mg Oral Q Mon & Thurs (0900) Sue Lush, APRN   450 mg at 11/08/19 1139 Continuous Infusions:PRN Meds:.ondansetron **OR** ondansetron (PF), sodium chloride, sodium chloride, traMADoL, traMADoLPE:Vitals:  11/10/19 0743 BP: (!) 153/87 Pulse: 76 Resp: 18 Temp: 97.5 ?F (36.4 ?C) Gen: alert and oriented, NADHEENT: moist mucosa, macroglossiaCV: S1/S2 no m/r/gResp: CTABAbd: soft, incision approximated.  JP in placeExt: trace pedal edema. RUE fistula with palpalbe thrill and audible bruitLABS:LabsRecent Labs Lab 08/25/210535 08/26/210513 08/27/210531 WBC 13.8* 9.9 8.8 HGB 11.0* 10.7* 11.0* HCT 33.0* 31.7* 33.0* PLT 214 200 202  Recent Labs Lab 08/26/210513 08/27/210531 08/28/210550 08/28/210739 NA 137 135* 133*  --  K 4.9 4.6 5.0  --  CL 97* 94* 93*  --  CO2 23 22 17*  --  BUN 56* 80* 95*  --  CREATININE 10.29* 11.47* 10.94*  --  GLU 114* 112* 107* 119* ANIONGAP 17 19* 23*  --  Recent Labs Lab 08/26/210513 08/27/210531 08/28/210550 CALCIUM 9.6 9.4 9.0 MG 2.2 2.0 2.3 PHOS 6.8* 7.3* 7.2*  Recent Labs Lab 08/23/210055 PTT 26.0 LABPROT 11.4 INR 1.06  Recent Labs Lab 08/23/210055 ALT 18 AST 17 ALKPHOS 67 BILITOT 0.4  Results for SKYDEN, MONGAR (MRN [id number]) as of 11/09/2019 11:22 Ref. Range 11/09/2019 05:31 Tacrolimus Lvl Latest Ref Range: See Comment ng/mL 4.3 (L) MICROBIOLOGY:reviewedRADIOLOGY:X-Ray Chest PA and LateralResult Date: 8/23/2021No acute radiographic finding.  Reported And Signed By: Beverly Gust, MDUS Renal Transplant With DopplerResult Date: 8/23/2021Satisfactory appearances of the renal transplant. Increased velocity in the post anastomotic external iliac artery, with increased resistance. This may be secondary to normal postsurgical effects. Recommend attention on follow-up. Report Initiated By:  Jarrett Soho, MD Reported And Signed By: Earl Many, MDAssessment:Adam Harper is a 59 y.o.  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 It appears that his graft function starting to pick up. Creatinine marginally improved with slight bump in BUN while off of diuretics. Urine output adequate. Transplant on 11/05/2019 with Campath induction on steroid taper and MMF 1 g BID. cPRA 0%. CMV D+/R+, Donor is Toxo IgG + 0.2. Renal Plan:- Cr 10.9, down from 11.5 ysterday- monitor off diuretics for now. - PhosLo 667 TID with meals. - No urgent need for dialysis at this time. - K 5, bicarb 17Immunosuppression- Campath induction per protocol- Steroid taper- MMF 1 g BID. - Tac 7 mg BID. Level is 5.6, will increase to 8/8.Prophylaxis- Fluconazole daily. - Bactrim double strength MWF for PJP prophylaxis and underlying Toxo. - Valcyte MTHTNContinue amlodipine 10Increase coreg to home dose of 25 BIDOk for d/c today. Will be seen in clinic on Monday

## 2019-11-10 NOTE — Plan of Care
Plan of Care Overview/ Patient Status    SOCIAL WORK NOTEPatient Name: CHUKWUEBUKA CHURCHILL Record Number: [id number] Date of Birth: Dec 30, 1962Social Work Follow Up    Most Recent Value Document Type Progress Note (For Inpatient/ED Only) Prior psychosocial assessment has been documented within 30 days of this hospitalization No Reason for Encounter Care Decision Concerns/Lack of Decision-Maker Intervention Decision-Making/Care Competence Assessment & Resolution Decision-Making/Care Competence Assessment & Resolution Assess support system, Psycho-education Source of Information Patient Record Reviewed Yes Level of Care Inpatient Social Work Cluster Transplant Identified Clinical/Disposition, Issues/Barriers: Brenon Antosh is a kidney transplant recipient planned for discharge home today. Intervention(s)/Summary 20 minutes were spent speaking with Mr. Barella with interpreter ID# [phone]. Mr. Kohlmann expressed how glad he is to be going home soon. He spoke about how his wife and daughter are going to be home to help with his care once he gets home. He did have some questions about his home medications so I encouraged him to ask these questions when the providers come to see him and when the team provides him with discharge instructions. Other than this he had no other questions or concerns at this time. I also reminded him to take the transplant education binder with him when he leaves so that he has all the necessary contact information. I provided my contact information as well and encouraged him to reach out with any other questions or concerns. Outcome Resolved Handoff Required? No Barriers to Discharge no barriers identified Next Steps/Plan (including hand-off): No further social work needs identified at this time, if any further questions or concerns arise please consult social work or contact me directly. Signature: Becky Augusta Contact Information: [phone]

## 2019-11-10 NOTE — Other
TRANSPLANT PHARMACY: TRANSPLANT POST-OP FOLLOW-UP NOTE #2[name] (59 y.o. male) is POD #5 from [facility].I provided patient and his wife with a customized MedActionPlan and drug monographs and counseled on the medication regimen, including immunosuppression, prophylactic agents, and supportive care agents.  I presented the benefits and risks of transplant immunosuppression to Adam Harper.  I emphasized the importance of medication adherence to avoid rejection and improve graft outcomes. I reviewed specific medications that he will be expected to take as part of his transplant regimen, including how to take each medication, timing of medication administration, common side effects, how we will monitor for side effects, and probable duration of therapy for each. I stressed the importance of avoiding herbal medications as well as grapefruit juice and recommended always checking with his doctor and pharmacist post-transplant before starting any new medication due to the potential drug interactions.  Adam Harper was counseled on medication storage and disposal: 	To keep all transplant medications at room temperature in a cool dry place away from extreme changes in temperature	To keep out of reach of children and pets	To not dispose medications in the toilet or trashPharmacist initial assessment/counseling:Patient high risk (pediatric, age <2 years or geriatric, age >65 years): NoAny cultural, language, physician, or literacy concerns identified: Yes Spanish interpreter utilized with MartiAllergy contraindications: NoMedication reconciliation completed and no contraindications identifiedHealth history completed and assessed for contraindicationsThe patient demonstrates and verbalizes understanding of the medication regimen. Adam Harper recognizes the importance of medication adherence and routine follow-up at clinic.  We talked about advocacy resources such as Engineer, mining.Med[facility] were [facility] and I confirmed all medications.Patient is cleared for discharge from a pharmacy perspective.Rosalene Billings, PharmDAugust 28, 20213:13 PM

## 2019-11-10 NOTE — Plan of Care
Plan of Care Overview/ Patient Status    Neuro:   A/Ox4Pain:  voiced c/o of mild hip painCV:  WDLResp:   WDLGI/GU:  WDLSkin:   drain, incisionMusculoskeletal:   ad libEvents:   OOB to BR, spont void in urinal, no BM so far this shift. Per team, patient to be discharged after meds arrive from pharmacy and pharmacist reviews. 1500: discharge instructions reviewed with patient and wife using language line Plan: Continue to observe, discharge today, fall and safety precautions/ checks, see flow sheets

## 2019-11-12 ENCOUNTER — Encounter: Admit: 2019-11-12 | Payer: PRIVATE HEALTH INSURANCE | Attending: Transplant Surgery | Primary: Family Medicine

## 2019-11-12 ENCOUNTER — Encounter: Admit: 2019-11-12 | Payer: PRIVATE HEALTH INSURANCE | Attending: Internal Medicine | Primary: Family Medicine

## 2019-11-12 ENCOUNTER — Telehealth: Admit: 2019-11-12 | Payer: PRIVATE HEALTH INSURANCE | Primary: Family Medicine

## 2019-11-12 ENCOUNTER — Ambulatory Visit: Admit: 2019-11-12 | Payer: PRIVATE HEALTH INSURANCE | Attending: Transplant Surgery | Primary: Family Medicine

## 2019-11-12 ENCOUNTER — Inpatient Hospital Stay: Admit: 2019-11-12 | Discharge: 2019-11-12 | Payer: PRIVATE HEALTH INSURANCE | Primary: Family Medicine

## 2019-11-12 ENCOUNTER — Encounter: Admit: 2019-11-12 | Payer: PRIVATE HEALTH INSURANCE | Primary: Family Medicine

## 2019-11-12 DIAGNOSIS — N186 End stage renal disease: Secondary | ICD-10-CM

## 2019-11-12 DIAGNOSIS — Z2989 Need for prophylactic immunotherapy: Secondary | ICD-10-CM

## 2019-11-12 DIAGNOSIS — G4733 Obstructive sleep apnea (adult) (pediatric): Secondary | ICD-10-CM

## 2019-11-12 DIAGNOSIS — E7800 Pure hypercholesterolemia, unspecified: Secondary | ICD-10-CM

## 2019-11-12 DIAGNOSIS — Z992 Dependence on renal dialysis: Secondary | ICD-10-CM

## 2019-11-12 DIAGNOSIS — I10 Essential (primary) hypertension: Secondary | ICD-10-CM

## 2019-11-12 DIAGNOSIS — N2581 Secondary hyperparathyroidism of renal origin: Secondary | ICD-10-CM

## 2019-11-12 DIAGNOSIS — I712 Thoracic aortic aneurysm (HC Code): Secondary | ICD-10-CM

## 2019-11-12 DIAGNOSIS — Z298 Encounter for other specified prophylactic measures: Secondary | ICD-10-CM

## 2019-11-12 DIAGNOSIS — T827XXA Infection and inflammatory reaction due to other cardiac and vascular devices, implants and grafts, initial encounter: Secondary | ICD-10-CM

## 2019-11-12 DIAGNOSIS — E213 Hyperparathyroidism, unspecified: Secondary | ICD-10-CM

## 2019-11-12 DIAGNOSIS — J101 Influenza due to other identified influenza virus with other respiratory manifestations: Secondary | ICD-10-CM

## 2019-11-12 DIAGNOSIS — K219 Gastro-esophageal reflux disease without esophagitis: Secondary | ICD-10-CM

## 2019-11-12 DIAGNOSIS — I770 Arteriovenous fistula, acquired: Secondary | ICD-10-CM

## 2019-11-12 DIAGNOSIS — D649 Anemia, unspecified: Secondary | ICD-10-CM

## 2019-11-12 DIAGNOSIS — K7460 Unspecified cirrhosis of liver: Secondary | ICD-10-CM

## 2019-11-12 DIAGNOSIS — Z940 Kidney transplant status: Secondary | ICD-10-CM

## 2019-11-12 LAB — CBC WITH AUTO DIFFERENTIAL
BKR WAM ABSOLUTE IMMATURE GRANULOCYTES: 0.1 x 1000/??L (ref 0.0–0.3)
BKR WAM ABSOLUTE LYMPHOCYTE COUNT: 0 x 1000/ÂµL — ABNORMAL LOW (ref 1.0–4.0)
BKR WAM ABSOLUTE NRBC: 0 x 1000/??L (ref 0.0–0.0)
BKR WAM ANALYZER ANC: 4.4 x 1000/ÂµL (ref 1.0–11.0)
BKR WAM BASOPHIL ABSOLUTE COUNT: 0 x 1000/ÂµL (ref 0.0–0.0)
BKR WAM BASOPHILS: 0.2 % (ref 0.0–4.0)
BKR WAM EOSINOPHIL ABSOLUTE COUNT: 0 x 1000/ÂµL (ref 0.0–1.0)
BKR WAM EOSINOPHILS: 0.4 % (ref 0.0–7.0)
BKR WAM HEMATOCRIT: 30.9 % — ABNORMAL LOW (ref 37.0–52.0)
BKR WAM HEMOGLOBIN: 10.5 g/dL — ABNORMAL LOW (ref 12.0–18.0)
BKR WAM IMMATURE GRANULOCYTES: 1.7 % (ref 0.0–3.0)
BKR WAM LYMPHOCYTES: 0.4 % — ABNORMAL LOW (ref 8.0–49.0)
BKR WAM MCH (PG): 36.5 pg — ABNORMAL HIGH (ref 27.0–31.0)
BKR WAM MCHC: 34 g/dL (ref 31.0–36.0)
BKR WAM MCV: 107.3 fL — ABNORMAL HIGH (ref 78.0–94.0)
BKR WAM MONOCYTE ABSOLUTE COUNT: 0.3 x 1000/??L (ref 0.0–2.0)
BKR WAM MONOCYTES: 5.6 % (ref 4.0–15.0)
BKR WAM MPV: 9.4 fL (ref 6.0–11.0)
BKR WAM NEUTROPHILS: 91.7 % — ABNORMAL HIGH (ref 37.0–84.0)
BKR WAM NUCLEATED RED BLOOD CELLS: 0 % (ref 0.0–1.0)
BKR WAM PLATELETS: 208 x1000/ÂµL (ref 140–440)
BKR WAM RDW-CV: 13.5 % (ref 11.5–14.5)
BKR WAM RED BLOOD CELL COUNT: 2.9 M/ÂµL — ABNORMAL LOW (ref 3.8–5.9)
BKR WAM WHITE BLOOD CELL COUNT: 4.8 x1000/??L (ref 4.0–10.0)

## 2019-11-12 LAB — MAGNESIUM: BKR MAGNESIUM: 2.2 mg/dL (ref 1.7–2.4)

## 2019-11-12 LAB — BASIC METABOLIC PANEL
BKR ANION GAP: 17 (ref 7–17)
BKR BLOOD UREA NITROGEN: 111 mg/dL — ABNORMAL HIGH (ref 6–20)
BKR BUN / CREAT RATIO: 10.5 % — ABNORMAL LOW (ref 8.0–23.0)
BKR CALCIUM: 8.9 mg/dL — ABNORMAL HIGH (ref 8.8–10.2)
BKR CHLORIDE: 96 mmol/L — ABNORMAL LOW (ref 98–107)
BKR CO2: 20 mmol/L (ref 20–30)
BKR CREATININE: 10.57 mg/dL — CR (ref 0.40–1.30)
BKR EGFR (AFR AMER): 6 mL/min/{1.73_m2} (ref >60)
BKR EGFR (NON AFRICAN AMERICAN): 5 mL/min/{1.73_m2} (ref >60)
BKR GLUCOSE: 91 mg/dL (ref 70–100)
BKR POTASSIUM: 4.1 mmol/L (ref 3.3–5.1)
BKR SODIUM: 133 mmol/L — ABNORMAL LOW (ref 136–144)
COMBINED OXIDATIVE PHOSPHORYLATION DEFICIENCY (COMPLEX 4 DEFICIENCY): 17 % (ref 7–17)

## 2019-11-12 LAB — IRON AND TIBC
BKR IRON: 116 ug/dL (ref 59–158)
BKR TOTAL IRON BINDING CAPACITY: NEGATIVE ng/mL — ABNORMAL HIGH (ref 30–400)

## 2019-11-12 LAB — TACROLIMUS LEVEL     (BH GH L LMW YH): BKR TACROLIMUS BLOOD: 7 ng/mL

## 2019-11-12 LAB — PHOSPHORUS     (BH GH L LMW YH): BKR PHOSPHORUS: 6.6 mg/dL — ABNORMAL HIGH (ref 2.2–4.5)

## 2019-11-12 LAB — FERRITIN: BKR FERRITIN: 1436 ng/mL — ABNORMAL HIGH (ref 30–400)

## 2019-11-12 MED ORDER — CARVEDILOL IMMEDIATE RELEASE 25 MG TABLET
25 mg | ORAL_TABLET | Freq: Two times a day (BID) | ORAL | 12 refills | 30.00 days | Status: AC
Start: 2019-11-12 — End: 2020-10-20
  Filled 2019-11-16: qty 60, 30d supply, fill #0

## 2019-11-12 MED ORDER — AMLODIPINE 10 MG TABLET
10 mg | ORAL_TABLET | Freq: Every morning | ORAL | 4 refills | 90.00 days | Status: AC
Start: 2019-11-12 — End: 2020-10-20
  Filled 2019-11-16: qty 90, 90d supply, fill #4
  Filled 2019-11-16: qty 90, 90d supply, fill #0

## 2019-11-12 MED ORDER — TACROLIMUS IMMEDIATE RELEASE 1 MG CAPSULE
1 mg | ORAL_CAPSULE | Freq: Two times a day (BID) | ORAL | 12 refills | 30.00 days | Status: SS
Start: 2019-11-12 — End: 2019-11-20

## 2019-11-12 NOTE — Telephone Encounter
Prograf Dose Change:Per Dr. Verline Lema Tacrolimus dose increased to 9 mg BID. I called [name] with dose change instructions. Unable to speak with pt. Left a VM asking to call us to confirm that they received the message with the dose adjustments. Electronically Signed by Ivar Drape, RN, November 12, 2019

## 2019-11-12 NOTE — Progress Notes
LogEventMarland Kitchen [name] #: [id number] Sent: 11/12/2019 09:27 MTCaller Name: Dicie Beam: [phone] Ext: From: Chemistry labMessage Type: General MessageMessage: Pt: Name: Kenner Lewan: December 08, 1962MDBridgett Larsson are seen at [facility] in [place], correct? yes Pre or Post: post Organ Type: kidneyRE: Critical creatinineCalled Haywood back to retrieve creatinine level. Creatinine is 10.57, slightly improved from prior. Dr. Verline Lema aware.

## 2019-11-12 NOTE — Progress Notes
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 There are no complaints of fevers, chills or intolerance to immunosuppressive medications. No urologic problems or difficulty urinating. Medical History: PMH PSH Past Medical History: Diagnosis Date ??? A-V fistula (HC Code)   right arm ??? Anemia  ??? AV fistula infection (HC Code)  ??? Cirrhosis (HC Code)  ??? 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 GERD (gastroesophageal reflux disease)  ??? Hypercholesteremia  ??? Hyperparathyroidism (HC Code)  ??? Hypertension  ??? Influenza A  ??? Morbid obesity (HC Code) 02/03/2018 ??? Obstructive sleep apnea   pt uses CPAP ??? Secondary hyperparathyroidism of renal origin (HC Code)  ??? Thoracic aortic aneurysm (HC Code)   Past Surgical History: Procedure Laterality Date ??? AV FISTULA PLACEMENT Left   left lower arm unsuccessful, then moved to upper arm 2011, then had problems in 10/2013 - infection  Social History Family History Social History Socioeconomic History ??? Marital status: Married   Spouse name: Not on file ??? Number of children: Not on file ??? Years of education: Not on file ??? Highest education level: Not on file Occupational History ??? Not on file Tobacco Use ??? Smoking status: Former Smoker   Packs/day: 0.25   Years: 25.00   Pack years: 6.25   Types: Cigarettes ??? Smokeless tobacco: Never Used Vaping Use ??? Vaping Use: Never used Substance and Sexual Activity ??? Alcohol use: No ??? Drug use: No ??? Sexual activity: Yes   Partners: Female Other Topics Concern ??? Not on file Social History Narrative ??? Not on file Social Determinants of Health Financial Resource Strain:  ??? Difficulty of Paying Living Expenses:  Food Insecurity:  ??? Worried About Programme researcher, broadcasting/film/video in the Last Year:  ??? Barista in the Last Year:  Transportation Needs:  ??? Freight forwarder (Medical):  ??? Lack of Transportation (Non-Medical):  Physical Activity:  ??? Days of Exercise per Week:  ??? Minutes of Exercise per Session:  Stress:  ??? Feeling of Stress :  Social Connections:  ??? Frequency of Communication with Friends and Family:  ??? Frequency of Social Gatherings with Friends and Family:  ??? Attends Religious Services:  ??? Database administrator or Organizations:  ??? Attends Engineer, structural:  ??? Marital Status:  Intimate Programme researcher, broadcasting/film/video Violence:  ??? Fear of Current or Ex-Partner:  ??? Emotionally Abused:  ??? Physically Abused:  ??? Sexually Abused:   History reviewed. No pertinent family history.  Medications Current Outpatient Medications Medication Sig Dispense Refill ??? acetaminophen (TYLENOL) 325 mg tablet Take 2 tablets (650 mg total) by mouth every 6 (six) hours as needed for up to 10 days. 30 tablet 11 ??? amLODIPine (NORVASC) 10 mg tablet Take 1 tablet (10 mg total) by mouth every morning. 90 tablet 3 ??? atorvastatin (LIPITOR) 40 mg tablet Take 1 tablet (40 mg total) by mouth daily. 30 tablet 5 ??? calcium acetate,phosphat bind, (PHOSLO) 667 mg tablet Take 3 tabs with each meal and 2 tabs with each snack, total 11 per day 330 tablet 11 ??? fluconazole (DIFLUCAN) 100 mg tablet Take 1 tablet (100 mg total) by mouth daily. 30 tablet 0 ??? mycophenolate mofetil (CELLCEPT) 250 mg capsule Take 4 capsules (1,000 mg total) by mouth 2 (two) times daily. 240 capsule 11 ??? omeprazole (PRILOSEC) 20 mg capsule Take 1 capsule (20 mg total) by mouth daily. 90 capsule 1 ???  predniSONE (DELTASONE) 5 mg tablet Take 2 tablets (10 mg total) by mouth daily. Take with food. 60 tablet 11 ??? sulfamethoxazole-trimethoprim (BACTRIM,SEPTRA) 400-80 mg per tablet Take 1 tablet by mouth daily. 30 tablet 5 ??? tacrolimus (PROGRAF) 1 mg immediate release capsule Take 8 capsules (8 mg total) by mouth 2 (two) times daily. 480 capsule 11 ??? valGANciclovir (VALCYTE) 450 mg tablet Take 1 tablet (450 mg total) by mouth Every Monday and Thursday. 60 tablet 2 ??? carvediloL (COREG) 25 mg Immediate Release tablet Take 1 tablet (25 mg total) by mouth 2 (two) times daily with breakfast and dinner. 60 tablet 11 ??? ciprofloxacin HCl (CIPRO) 500 mg tablet Take 1 tablet (500 mg total) by mouth 2 (two) times daily. DO NOT TAKE UNTIL PRESCRIBER NOTIFIES YOU (Patient not taking: Reported on 11/12/2019) 4 tablet 0 ??? senna-docusate (SENNA-PLUS) 8.6-50 mg tablet Take 1 tablet by mouth nightly. (Patient not taking: Reported on 11/12/2019)   No current facility-administered medications for this visit.   Allergies No Known Allergies Review of Systems: Review of Systems Constitutional: Negative for chills and fever. Eyes: Negative for blurred vision. Respiratory: Negative.  Cardiovascular: Negative for chest pain and palpitations. Gastrointestinal: Negative for constipation, diarrhea, heartburn, nausea and vomiting. Genitourinary: Negative for dysuria. Neurological: Negative for dizziness. Objective: Vitals: BP (!) 144/89 (Site: l a, Position: Sitting, Cuff Size: Large)  - Pulse 64  - Temp 97.8 ???F (36.6 ???C) (Temporal)  - Wt 117 kg  - SpO2 96%  - BMI 38.10 kg/m???  Wt Readings from Last 4 Encounters: 11/12/19 117 kg 11/10/19 116.8 kg 10/02/19 119.3 kg 10/02/19 119.3 kg Physical Exam: Physical ExamConstitutional:     Appearance: Normal appearance. HENT:    Head: Normocephalic and atraumatic. Abdominal:    General: There is no distension.    Palpations: There is no mass.    Tenderness: There is no abdominal tenderness.    Hernia: No hernia is present.    Comments: RLQ incision staples intact and dry Musculoskeletal:       General: No swelling or tenderness. Neurological:    Mental Status: He is alert. 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 Kidney graft function is good and at baseline. Blood pressure is well controlled. Medication reconciliation performed by coordinator. Doing well and making more urine.Recent LabsCreatinine Date Value Ref Range Status 11/12/2019 10.57 (HH) 0.40 - 1.30 mg/dL Final 16/12/9602 54.09 (H) 0.40 - 1.30 mg/dL Final 81/19/1478 29.56 (H) 0.40 - 1.30 mg/dL Final 21/30/8657 84.69 (H) 0.40 - 1.30 mg/dL Final BUN Date Value Ref Range Status 11/12/2019 111 (H) 6 - 20 mg/dL Final 62/95/2841 95 (H) 6 - 20 mg/dL Final 32/44/0102 80 (H) 6 - 20 mg/dL Final 72/53/6644 56 (H) 6 - 20 mg/dL Final Tacrolimus Lvl Date Value Ref Range Status 11/10/2019 5.6 See Comment ng/mL Final   Comment:   As of 02/01/18, this assay is being performed on the Roche Cobas 8000 system from LC/MS/MS. If a direct comparison of a prior sample (tested by previous method) and the current sample is desired, please contact the Chemistry laboratory for additional testing.Reference range applies to trough level only. Testing performed by automated clinical chemistry analyzers. Therapeutic Reference Range (Trough): 5.0 - 20 ng/mL 11/09/2019 4.3 (L) See Comment ng/mL Final   Comment:   As of 02/01/18, this assay is being performed on the Roche Cobas 8000 system from LC/MS/MS. If a direct comparison of a prior sample (tested by previous method) and the current sample  is desired, please contact the Chemistry laboratory for additional testing.Reference range applies to trough level only. Testing performed by automated clinical chemistry analyzers. Therapeutic Reference Range (Trough): 5.0 - 20 ng/mL 11/08/2019 2.1 (L) See Comment ng/mL Final   Comment:   As of 02/01/18, this assay is being performed on the Roche Cobas 8000 system from LC/MS/MS. If a direct comparison of a prior sample (tested by previous method) and the current sample is desired, please contact the Chemistry laboratory for additional testing.Reference range applies to trough level only. Testing performed by automated clinical chemistry analyzers. Therapeutic Reference Range (Trough): 5.0 - 20 ng/mL 11/07/2019 1.1 (L) See Comment ng/mL Final   Comment:   As of 02/01/18, this assay is being performed on the Roche Cobas 8000 system from LC/MS/MS. If a direct comparison of a prior sample (tested by previous method) and the current sample is desired, please contact the Chemistry laboratory for additional testing.Reference range applies to trough level only. Testing performed by automated clinical chemistry analyzers. Therapeutic Reference Range (Trough): 5.0 - 20 ng/mL Immunosuppression     Start End  mycophenolate mofetil (CELLCEPT) 250 mg capsule 11/06/2019   Sig - Route: Take 4 capsules (1,000 mg total) by mouth 2 (two) times daily. - Oral  Notes to Pharmacy: Kidney Transplant Z94.0  predniSONE (DELTASONE) 5 mg tablet 11/10/2019   Sig - Route: Take 2 tablets (10 mg total) by mouth daily. Take with food. - Oral  Notes to Pharmacy: Kidney Transplant Z94.0  tacrolimus (PROGRAF) 1 mg immediate release capsule 11/10/2019   Sig - Route: Take 8 capsules (8 mg total) by mouth 2 (two) times daily. - Oral  Notes to Pharmacy: Kidney Transplant Z94.0  Signed:Iliya Spivack Marinell Blight, MD Laboratory:Lab Results Component Value Date  WBC 4.8 11/12/2019  HGB 10.5 (L) 11/12/2019  HCT 30.9 (L) 11/12/2019  MCV 107.3 (H) 11/12/2019  PLT 208 11/12/2019 Lab Results Component Value Date  CREATININE 10.57 (HH) 11/12/2019  BUN 111 (H) 11/12/2019  NA 133 (L) 11/12/2019  K 4.1 11/12/2019  CL 96 (L) 11/12/2019  CO2 20 11/12/2019

## 2019-11-12 NOTE — Progress Notes
[facility] CenterKidney Transplant ProgramPost Transplant Nursing Assessment and Education  Nursing Assessment:  Adam Harper (59 y.o. 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 Last night from 11 pm-0700 am - he urinated around 1000 cc. GI: moving bowels - not taking stool softeners Stent: still in Edema: NoneObjective Data:  Vital Signs: BP (!) 144/89 (Site: l a, Position: Sitting, Cuff Size: Large)  - Pulse 64  - Temp 97.8 ???F (36.6 ???C) (Temporal)  - Wt 117 kg  - SpO2 96%  - BMI 38.10 kg/m???  Most recent creatinine: Lab Results Component Value Date  CREATININE 10.57 (HH) 11/12/2019 Lab Results Component Value Date  TACROLIMUS 5.6 11/10/2019 Immunosuppression:  Current Transplant Immunosuppression:Immunosuppression     Start End  mycophenolate mofetil (CELLCEPT) 250 mg capsule 11/06/2019   Sig - Route: Take 4 capsules (1,000 mg total) by mouth 2 (two) times daily. - Oral  Notes to Pharmacy: Kidney Transplant Z94.0  predniSONE (DELTASONE) 5 mg tablet 11/10/2019   Sig - Route: Take 2 tablets (10 mg total) by mouth daily. Take with food. - Oral  Notes to Pharmacy: Kidney Transplant Z94.0  tacrolimus (PROGRAF) 1 mg immediate release capsule 11/10/2019   Sig - Route: Take 8 capsules (8 mg total) by mouth 2 (two) times daily. - Oral  Notes to Pharmacy: Kidney Transplant Z94.0  Transplant Prophylaxis       fluconazole (DIFLUCAN) 100 mg tablet Take 1 tablet (100 mg total) by mouth daily.  Number of times this order has been changed since signing: 10   Order Audit Trail   ciprofloxacin HCl (CIPRO) 500 mg tablet Take 1 tablet (500 mg total) by mouth 2 (two) times daily. DO NOT TAKE UNTIL PRESCRIBER NOTIFIES YOU  Number of times this order has been changed since signing: 6   Order Audit Trail   sulfamethoxazole-trimethoprim (BACTRIM,SEPTRA) 400-80 mg per tablet Take 1 tablet by mouth daily.  Number of times this order has been changed since signing: 9   Order Audit Trail   valGANciclovir (VALCYTE) 450 mg tablet Take 1 tablet (450 mg total) by mouth Every Monday and Thursday.  Number of times this order has been changed since signing: 9   Order Audit Trail     No Known AllergiesAnti-viral Prophylaxis:ValcytePCP Prophylaxis: BactrimAntifungal Prophylaxis:Current IS Regimen:Tac 8/8MMF 1 g BIDPred 10 mg daily Prograf level is a 12.5 hour trough.Transplant graft is not functioning. Plan: Review labs with clinic attending. Kace will be notified of any immunosuppression changes/treatments based on lab results.Azai was provided education on maintaining medication compliance and post-transplant care.  All questions pertaining to post transplant care were answered. Patient was advised to contact the transplant center with any concerns. The patient verbalized understanding and intent to adhere to the transplant plan of care. Electronically Signed by Ivar Drape, RN, November 12, 2019

## 2019-11-13 ENCOUNTER — Encounter: Admit: 2019-11-13 | Payer: PRIVATE HEALTH INSURANCE | Primary: Family Medicine

## 2019-11-13 ENCOUNTER — Encounter: Admit: 2019-11-13 | Payer: PRIVATE HEALTH INSURANCE | Attending: Pharmacotherapy | Primary: Family Medicine

## 2019-11-13 DIAGNOSIS — Z940 Kidney transplant status: Secondary | ICD-10-CM

## 2019-11-13 NOTE — Discharge Summary
[place] [facility]scTrans[facility] Surgery Discharge SummaryPatient Data:  Patient Name: Adam Harper Admit date: 11/04/2019 Age: 59 y.o. Discharge date: 11/10/2019 DOB: [birth year]	 Discharge Attending Physician: Orland Penman, MD MRN: [id number]	 Discharged Condition: good PCP: [name], MD Disposition: Home  Principal Diagnosis: Kidney replaced by transplantSecondary Diagnoses: GERD. OSAIssues to be Addressed Post Discharge: Issues to be Addressed Post Discharge: 1. Ureteral Stent: Schedule Urology appointment for removal of ureteral stent. 	Cipro provided for prophylaxis2. Blood Pressure: monitor BP at home and bring log to clinic. 	Current BP regimen includes: Norvasc 10mg  QD and Carvedilol 25mg  BBID3. Blood Glucose: Hgb A1c is 5.7. Currently not on any DM regimen4. Immunosuppression: Tac 8/8. MMF 1000mg  BID. Pred 10mg .5. CMV Prophylaxis: CMV D+/R+. Valcyte M/Thurs.6. PJP Prophylaxis: Bactrim SS QD. Monitor for hyperK+7. DonorToxo IgG +/Recipient neg-, on Bactrim for ppx 8. Outpatient sleep medicine referral for OSA on CPAP placed9. Had HD on POD 0 and POD2. Not on any diuretic regimen on discharge. HD spot available and confirmed with HD centerPending Labs and Tests: Pending Lab Results   Order Current Status  Basic metabolic panel Collected (11/06/19 0526)  CBC auto differential Collected (11/06/19 0526)  Magnesium Collected (11/06/19 0526)  Phosphorus Collected (11/06/19 0526)  HLA type, DNA storage ([facility]) In process  HLA pre transplant antibody evaluation [facility]) Preliminary result  Follow-up Information:[facility] at [address] [facility] [address]  [address] [facility] [place] [phone] 8/30/2021kidney transplant follow up at 11am. Please arrive early and proceed to the 2nd floor for blood work prior to clinic appt. Do not take your morning dose of Prograf until blood draw is complete.[name], MD[address] [phone] Sleep Med [place] [address] [place] [phone] Future Appointments Date Time Provider Department Center 11/12/2019 10:00 AM [facility] TXP SURGERY IM TRANSPLNT [facility] CAD 11/20/2019  2:30 PM Possick, Trina Ao, MD [facility] NO HAV [facility] 11/28/2019  7:45 AM Devito, Daralene Milch., MD UROLOG [facility] 10/28/2020 10:00 AM Jayme Cloud, MD MUSCULAR [facility] Hospital Course: Hospital Course: Kerby A. Ballin is a 59 year old male with a history of ESRD secondary to FSGS, who successfully underwent a deceased donor kidney transplant (L kidney to R iliac fossa- 1A/1V/1U -stented)  with Campath induction on 11/05/2019. He tolerated the procedure well with good graft function on [place] immediately post-op. On POD1, he was advanced to a regular diet, started on bowel regimen, and IVF was switched to maintenance rate.On POD2, his urine output was improving, however, he was not clearing creatinine and had some electrolyte imbalances including sodium of 131 and potassium of 6.0 for which he received IV lasix and underwent session of HD. Pharmacologic teaching was reinforced. On POD3, foley was removed after which he was voiding spontaneously with low residuals.  Upon discharge on POD 4, he was afebrile, hemodynamically stable, tolerating a regular diet and voiding spontaneously. JP was removed as well. His pain was well controlled with oral analgesics. He was deemed safe for discharge home with the appropriate follow ups, medications, and discharge instructions. HD spot was confirmed to be available at his HD center. Multidisciplinary team rounds were conducted daily and plan of care and discharge needs were discussed with transplant surgeon, transplant nephrologist, transplant APRN, nursing, social work, nutrition, resident team, pharmacist and care coordinator.Immunosuppression:	Prograf 8/8	MMF 1000 mg BID	Prednisone 10 mg QDProphylaxis:	Fluconazole 100 mg QD x 1 month .	Valganciclovir x 3 months. CMV serostatus D+/R+.	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) CPRA:  0%; Campath induction TXP Organ: LEFT KIDNEY Relation: Other - Non Biological HLA Match:  Source (Donor Type): Donation after Brain Death Donor Class: KDPI 21-85 PHS high risk: No  Pertinent Procedures or Surgeries: Surgical and Procedural Summary this Admission   Past Procedures (11/04/2019 to 11/08/2019)   Date Procedures Providers Location  11/05/2019 RENAL ALLOTRANSPLANTATION, GRAFT IMPLANT; W/O RECIPIENT NEPHRECTOMY----Left;BACKBENCH [facility]ER OR LIVING DONOR RENAL ALLOGRAFT RECONSTRUCT PRIOR TO TRANSPLANT; VENOUS ANAST, EA----Left [facility]    Pertinent lab findings and test results: Objective: Recent Labs Lab 08/26/210513 08/27/210531 08/28/210550 WBC 9.9 8.8 8.4 HGB 10.7* 11.0* 11.1* HCT 31.7* 33.0* 31.5* PLT 200 202 225  Recent Labs Lab 08/26/210513 08/27/210531 08/28/210550 NEUTROPHILS 91.6* 92.9* 93.6*  Recent Labs Lab 08/26/210513 08/27/210531 08/28/210550 08/28/210739 NA 137 135* 133*  --  K 4.9 4.6 5.0  --  CL 97* 94* 93*  --  CO2 23 22 17*  --  BUN 56* 80* 95*  --  CREATININE 10.29* 11.47* 10.94*  --  GLU 114* 112* 107* 119* ANIONGAP 17 19* 23*  --   Recent Labs Lab 08/26/210513 08/27/210531 08/28/210550 CALCIUM 9.6 9.4 9.0 MG 2.2 2.0 2.3 PHOS 6.8* 7.3* 7.2*  Recent Labs Lab 08/23/210055 ALT 18 AST 17 ALKPHOS 67 BILITOT 0.4  Recent Labs Lab 08/23/210055 PTT 26.0 LABPROT 11.4 INR 1.06  Recent Labs Lab 08/26/210513 08/27/210531 08/28/210550 TACROLIMUS 2.1* 4.3* 5.6 Imaging: Imaging results last 1 week:  X-Ray Chest PA and LateralResult Date: 8/23/2021No acute radiographic finding. Reported And Signed By: Beverly Gust, MDUS Renal Transplant With DopplerResult Date: 8/23/2021Satisfactory appearances of the renal transplant. Increased velocity in the post anastomotic external iliac artery, with increased resistance. This may be secondary to normal postsurgical effects. Recommend attention on follow-up. Report Initiated By:  Jarrett Soho, MD Reported And Signed By: Earl Many, MDDiet:  Regular diet Mobility: Highest Level of mobility - ACTUAL: Mobility Level 7, Walk 25+ feet, AM PAC 22-23Physical Therapy Disposition Recommendation: HomeAdditional Physical Therapy Disposition Recommendations: No follow up therapy needsPhysical Exam Discharge vitals: Temp:  [97.4 ?F (36.3 ?C)-98.2 ?F (36.8 ?C)] 97.6 ?F (36.4 ?C)Pulse:  [71-83] 74Resp:  [17-20] 18BP: (148-185)/(80-95) 159/95SpO2:  [96 %-99 %] 99 %Device (Oxygen Therapy): room air Pertinent Findings of Physical Exam: As per discharge physical exam below Cognitive Status at Discharge: BaselineDischarge Physical Exam:General: Alert, appropriate affect. Sitting upright in char, NAD.HEENT: NC/AT.Pulm: Normal inspiratory effort on RA. Cardio: Regular rate.Abdomen: Abdomen soft, nondistended. Incision w/ staples c/d/I.Appropriately tender to palpation. JP removed prior to dcGU: Voiding independently.Extremities: Warm, well perfused, without edema x 4.Allergies No Known Allergies PMH PSH Past Medical History: Diagnosis Date ? A-V fistula (HC Code)   right arm ? Anemia  ? AV fistula infection (HC Code)  ? Cirrhosis (HC Code)  ? 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 GERD (gastroesophageal reflux disease)  ? Hypercholesteremia  ? Hyperparathyroidism (HC Code)  ? Hypertension  ? Influenza A  ? Morbid obesity (HC Code) 02/03/2018 ? Obstructive sleep apnea   pt uses CPAP ? Secondary hyperparathyroidism of renal origin (HC Code)  ? Thoracic aortic aneurysm (HC Code)   Past Surgical History: Procedure Laterality Date ? AV FISTULA PLACEMENT Left   left lower arm unsuccessful, then moved to upper arm 2011, then had problems in 10/2013 - infection  Social History Family History Social History Tobacco Use ? Smoking status: Former Smoker   Packs/day: 0.25   Years: 25.00   Pack years: 6.25   Types: Cigarettes ?  Smokeless tobacco: Never Used Substance Use Topics ? Alcohol use: No  History reviewed. No pertinent family history. Discharge Medications: Current Discharge Medication List  START taking these medications  Details acetaminophen (TYLENOL) 325 mg tablet Take 2 tablets (650 mg total) by mouth every 6 (six) hours as needed for up to 10 days.Qty: 30 tablet, Refills: 11Start date: 11/10/2019, End date: 11/20/2019  ciprofloxacin HCl (CIPRO) 500 mg tablet Take 1 tablet (500 mg total) by mouth 2 (two) times daily. DO NOT TAKE UNTIL PRESCRIBER NOTIFIES YOUQty: 4 tablet, Refills: 0Start date: 11/06/2019  fluconazole (DIFLUCAN) 100 mg tablet Take 1 tablet (100 mg total) by mouth daily.Qty: 30 tablet, Refills: 0Start date: 11/06/2019, End date: 12/10/2019  mycophenolate mofetil (CELLCEPT) 250 mg capsule Take 4 capsules (1,000 mg total) by mouth 2 (two) times daily.Qty: 240 capsule, Refills: 11Start date: 11/06/2019  predniSONE (DELTASONE) 5 mg tablet Take 2 tablets (10 mg total) by mouth daily. Take with food.Qty: 60 tablet, Refills: 11Start date: 11/10/2019  senna-docusate (SENNA-PLUS) 8.6-50 mg tablet Take 1 tablet by mouth nightly.Start date: 11/10/2019  sulfamethoxazole-trimethoprim (BACTRIM,SEPTRA) 400-80 mg per tablet Take 1 tablet by mouth daily.Qty: 30 tablet, Refills: 5Start date: 11/06/2019, End date: 12/10/2019 tacrolimus (PROGRAF) 1 mg immediate release capsule Take 8 capsules (8 mg total) by mouth 2 (two) times daily.Qty: 480 capsule, Refills: 11Start date: 11/10/2019  valGANciclovir (VALCYTE) 450 mg tablet Take 1 tablet (450 mg total) by mouth Every Monday and Thursday.Qty: 60 tablet, Refills: 2Start date: 11/12/2019   CONTINUE these medications which have CHANGED  Details atorvastatin (LIPITOR) 40 mg tablet Take 1 tablet (40 mg total) by mouth daily.Qty: 30 tablet, Refills: 5Start date: 11/10/2019  Associated Diagnoses: ESRD (end stage renal disease) on dialysis (HC Code)  carvediloL (COREG) 12.5 mg Immediate Release tablet Take 2 tablets (25 mg total) by mouth 2 (two) times daily with breakfast and dinner.Qty: 120 tablet, Refills: 11Start date: 11/10/2019, End date: 11/09/2020   CONTINUE these medications which have NOT CHANGED  Details amLODIPine (NORVASC) 10 mg tablet Take 1 tablet (10 mg total) by mouth every morning.Qty: 90 tablet, Refills: 3  Associated Diagnoses: ESRD (end stage renal disease) on dialysis (HC Code)  calcium acetate,phosphat bind, (PHOSLO) 667 mg tablet Take 3 tabs with each meal and 2 tabs with each snack, total 11 per dayQty: 330 tablet, Refills: 11  omeprazole (PRILOSEC) 20 mg capsule Take 1 capsule (20 mg total) by mouth daily.Qty: 90 capsule, Refills: 1   STOP taking these medications   calcitrioL (ROCALTROL) 0.5 MCG capsule    LORazepam (ATIVAN) 1 mg tablet    calcium carbonate (TUMS ULTRA) 1000 mg (400 mg calcium) chewable tablet    DARBepoetin alfa-polysorbate (ARANESP) 60 mcg/0.3 mL syringe    diclofenac (VOLTAREN) 1 % gel    epoetin alfa (EPOGEN INJ)    iron sucrose complex (VENOFER IV)    lisinopriL (PRINIVIL,ZESTRIL) 40 mg tablet    oxyCODONE (ROXICODONE) 5 mg Immediate Release tablet    triamcinolone (KENALOG) 0.1 % cream    Electronically Signed:Apphia Harris, APRN // Alfonso Ramus, APRNTransplant Surgery8/28/2021 Best Contact Information: [phone]

## 2019-11-14 ENCOUNTER — Encounter: Admit: 2019-11-14 | Payer: PRIVATE HEALTH INSURANCE | Primary: Family Medicine

## 2019-11-14 ENCOUNTER — Encounter: Admit: 2019-11-14 | Payer: PRIVATE HEALTH INSURANCE | Attending: Nephrology | Primary: Family Medicine

## 2019-11-14 DIAGNOSIS — J101 Influenza due to other identified influenza virus with other respiratory manifestations: Secondary | ICD-10-CM

## 2019-11-14 DIAGNOSIS — D649 Anemia, unspecified: Secondary | ICD-10-CM

## 2019-11-14 DIAGNOSIS — I770 Arteriovenous fistula, acquired: Secondary | ICD-10-CM

## 2019-11-14 DIAGNOSIS — N2581 Secondary hyperparathyroidism of renal origin: Secondary | ICD-10-CM

## 2019-11-14 DIAGNOSIS — E7800 Pure hypercholesterolemia, unspecified: Secondary | ICD-10-CM

## 2019-11-14 DIAGNOSIS — N186 End stage renal disease: Secondary | ICD-10-CM

## 2019-11-14 DIAGNOSIS — T827XXA Infection and inflammatory reaction due to other cardiac and vascular devices, implants and grafts, initial encounter: Secondary | ICD-10-CM

## 2019-11-14 DIAGNOSIS — G4733 Obstructive sleep apnea (adult) (pediatric): Secondary | ICD-10-CM

## 2019-11-14 DIAGNOSIS — I10 Essential (primary) hypertension: Secondary | ICD-10-CM

## 2019-11-14 DIAGNOSIS — K219 Gastro-esophageal reflux disease without esophagitis: Secondary | ICD-10-CM

## 2019-11-14 DIAGNOSIS — E213 Hyperparathyroidism, unspecified: Secondary | ICD-10-CM

## 2019-11-14 DIAGNOSIS — K7460 Unspecified cirrhosis of liver: Secondary | ICD-10-CM

## 2019-11-14 DIAGNOSIS — I712 Thoracic aortic aneurysm (HC Code): Secondary | ICD-10-CM

## 2019-11-14 NOTE — Progress Notes
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 on 8/23/21Interim Events: - No acute events overnight- Refused using his CPAP last night, was stable on RA- UOP is improving- Drain o/p downtrending- Morning labs notable for Na 131, K 6.0, Creat 14.26 (13.49)Objective: Vitals:Temp:  [97.4 ???F (36.3 ???C)-98.3 ???F (36.8 ???C)] 97.9 ???F (36.6 ???C)Pulse:  [68-88] 68Resp:  [16-20] 20BP: (130-159)/(78-84) 149/82SpO2:  [95 %-97 %] 95 %I/O's:I/O last 3 completed shifts:In: 840 [P.O.:840]Out: 1516 [Urine:1416; Other:100]Physical Exam:Gen: Awake, alert, no acute distress, resting comfortably in chairLungs: Normal respiratory effort on RAHeart: RRRAbd: soft, appropriately tender, non-distended. Incision sites well approximated. JP drain with scant serosanguinous drainage. GU: Foley producing yellow urineExt: warm, well perfused x4, No edemaNeuro: AOx4, no focal deficitsLabs:Recent Labs Lab 08/23/211805 08/24/210524 08/25/210535 WBC 9.4 11.6* 13.8* HGB 10.7* 11.5* 11.0* HCT 31.4* 33.9* 33.0* PLT 221 225 214 Recent Labs Lab 08/23/211805 08/24/210524 08/24/211106 08/24/211818 08/24/212049 08/25/210535 08/25/210734 NA 135* 132* 132* 132*  --  131*  --  K 4.4 5.5* 5.5* 5.4*  --  6.0*  --  CL 96* 94* 93* 93*  --  92*  --  CO2 21 19* 18* 18*  --  17*  --  BUN 31* 45* 49* 54*  --  69*  --  CREATININE 11.20* 12.55* 13.02* 13.49*  --  14.26*  --  GLU 125* 124* 138* 136* 120* 119* 116* CALCIUM 9.4 9.2 9.6 9.9  --  9.4  --  MG 1.8 1.9  --   --   --  2.2  --  PHOS 4.2 4.2  --   --   --  7.1*  --  Recent Labs Lab 08/23/210055 ALT 18 AST 17 ALKPHOS 67 BILITOT 0.4 Recent Labs Lab 08/23/210055 PTT 26.0 INR 1.06 Recent Labs Lab 08/25/210535 TACROLIMUS 1.1* Diagnostics:No results found.Assessment: 59 year old male who is now 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 He recovered well in the PACU post-operatively and was transferred to the floor. The patient is doing well. His  Pain is much better controlled and his urine output continues to improve. He is out of bed, ambulating and tolerating a regular diet. Will continue to advance post-op care.Plan: Immunosuppression: Tac 5/5MMF 1000/1000MP taper per protocolProph: FluconazoleStart PJP and CMV prophylaxis possibly tomorrowNeuro:  Alert and oriented. No issues.Pain:  Tylenol/TramadolCV:  BP controlled. No additional interventions at this time.Resp: Stable on RA. Encourage IS/OOB/AmbulateHome CPAP at night as toleratedGI: Regular diet. Bowel regimen.GU: s/p DDKT (8/23) Trend creatinine. Is & Os. Daily weights-Lasix 120mg  IV once today-HD today3 day Foley Heme: Trend AM labsEndo: Continue to monitor blood sugar.F/E/N: HLIVF. Add Phoslo 667mg  TIDDispo: Continue supportive careMultidisciplinary team rounds were conducted and plan of care and discharge needs were discussed with transplant surgeon, nephrologist, transplant APRN, nursing, social work, Journalist, newspaper, resident team, pharmacist and care coordinator.Electronically signed UV:[id number] Theresia Bough, MBBS/ Sue Lush, APRNTransplant Surgery08/25/2110:04 AMATTENDING ATTESTATIONI examined the patient and interviewed the patient on 11/07/2019.  I have read and edited the practitioner's note.  I agree with all elements of the note.  Adam Harper is a 59 y.o. 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  The patient's post-operative course has been stable.  The patient is now POD2.PHYSICAL EXAMAPPEARANCE		in no apparent distressNEUROLOGIC		Interactive, alert and orientedPULMONARY		No respiratory distressCARDIAC		ABDOMINAL		Obese abdomen with large panus.  Right lower quadrant transplant incision clean, dry, and intactJackson pratt drains in place, productive of thin  serosanguinous fluid. EXTREMITIES		No edema in bilateral lower extremities.  No clubbing or cyanosis. ASSESSMENT / PLANLuis A Harper is a 59 y.o. patient who is POD2 from renal transplant.  The renal allograft is functioning reasonably well with strong urine output and elevated creatinine.  The patient is progressing well.  Potassium 6.0, up from 5.5. Will erform HD today. Renal diet. Out of bed to ambulate TIDAppreciate input from Transplant Nephrology Service. Continue to check daily Prograf level and dose to achieve a Prograf level of 8-10.CellCept 1000 mg BIDSolumedrol steroid taperBart Pascal Lux, MDTransplant Surgery

## 2019-11-14 NOTE — Progress Notes
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 Got Oxy 5mg  x1- Uop > 20cc/hr pretty consistently- JP < 135cc seosanguinous drainage overnightObjective: Vitals:Temp:  [96.4 ???F (35.8 ???C)-97.9 ???F (36.6 ???C)] 97.7 ???F (36.5 ???C)Pulse:  [68-87] 87Resp:  [10-23] 20BP: (127-180)/(71-104) 127/79SpO2:  [94 %-99 %] 94 %I/O's:I/O last 3 completed shifts:In: 3440 [P.O.:840; I.V.:2100; Other:400; IV Piggyback:100]Out: 5015 [Urine:480; Other:4535]Physical Exam:Gen: NAD, AOxNeuro: is nonfocal, CN II-XII intactHEENT: NC/ATCV: RRR no m/r/gPulm: CTABAbd: soft, non-tender,  non-distended, dressing in placeGU: foley in place, scant outputExt: WWP, distal pulses intact bialterallyLabs:Recent Labs Lab 08/23/210055 08/23/211805 08/24/210524 WBC 5.0 9.4 11.6* HGB 10.2* 10.7* 11.5* HCT 30.0* 31.4* 33.9* PLT 219 221 225 Recent Labs Lab 08/23/210055 08/23/211805 08/24/210519 08/24/210524 08/24/210755 NA 140 135*  --  132*  --  K 4.8 4.4  --  5.5*  --  CL 94* 96*  --  94*  --  CO2 26 21  --  19*  --  BUN 60* 31*  --  45*  --  CREATININE 17.07* 11.20*  --  12.55*  --  GLU 104* 125* 128* 124* 124* CALCIUM 11.3* 9.4  --  9.2  --  MG 2.2 1.8  --  1.9  --  PHOS 8.4* 4.2  --  4.2  --  Recent Labs Lab 08/23/210055 ALT 18 AST 17 ALKPHOS 67 BILITOT 0.4 Recent Labs Lab 08/23/210055 PTT 26.0 INR 1.06 Diagnostics:US Renal Transplant With DopplerResult Date: 8/23/2021Satisfactory appearances of the renal transplant. Increased velocity in the post anastomotic external iliac artery, with increased resistance. This may be secondary to normal postsurgical effects. Recommend attention on follow-up. 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 He recovered well in the PACU post-operatively and was transferred to the floor. Major issu with him overnight was his pain control. We will plan to modify current pain regimen to adequately address this issue. He has good urine output and post-operative labs were stable. We will continue to advance care and monitor closely.Plan: Immunosuppression: Await repeat labs this PM before starting tac tonightMMF 1000/1000Pred taper per protocolNeuro:  Alert and oriented. No issues.  Pain:  Tylenol/Oxy/TramadolCV:  BP controlled, continue to monitor for nowResp: stable on room air. Encouraged use of IS, OOB and ambulateGI: Diet regular. Bowel regimen per protocolGU: s/p DDKT (11/05/19) Trend creatinine. Is and Os. Daily weights 	HyperK+ today, medically manage for now	Repeat BMP at 1pm	3 day foley	Heme: Continue to follow H/H, platelets, WBCID: Fluconazole daily. Will start PJP and CMV ppx in a few days	CMV serostatus: D+/R+	Donor is Toxo IgG+, awaiting recipient toxo IgG resultEndo: Blood sugar stable. Fingerstick per protocolF/E/N: IVF to 50cc/hr. Replace lytes as needed.Proph: Hep SQ/Pepcid/SCDs/IS. Dispo: Continue supportive careMultidisciplinary team rounds were conducted and plan of care and discharge needs were discussed with transplant surgeon, nephrologist/hepatologist, transplant APRN, nursing, social work, Journalist, newspaper, resident team, pharmacist and care coordinator.Signed:Joynell Mirasol, APRNTransplant Surgery08/24/219:54 AMATTENDING ATTESTATIONI examined the patient and interviewed the patient on 11/06/2019.  I have read and edited the practitioner's note.  I agree with all elements of the note.  Adam Harper is a 59 y.o. 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  The patient's post-operative course has  been stable.  The patient is now POD1.PHYSICAL EXAMAPPEARANCE		in no apparent distressNEUROLOGIC		Interactive, alert and orientedPULMONARY		No respiratory distressCARDIAC		ABDOMINAL		Obese abdomen with large panus.  Right lower quadrant transplant incision clean, dry, and intactJackson pratt drains in place, productive of thin serosanguinous fluid. EXTREMITIES		No edema in bilateral lower extremities.  No clubbing or cyanosis. ASSESSMENT / PLANLuis A Harper is a 59 y.o. patient who is POD1 from renal transplant.  The renal allograft is functioning reasonably well with strong urine output and elevated creatinine.  The patient is progressing well.  Potassium 5.5. OK to perform HD today. Renal diet. Out of bed to ambulate TIDAppreciate input from Transplant Nephrology Service. Continue to check daily Prograf level and dose to achieve a Prograf level of 8-10.CellCept 1000 mg BIDSolumedrol steroid taperBart Pascal Lux, MDTransplant Surgery

## 2019-11-14 NOTE — Progress Notes
[facility]	 [facility]	TRANSPLANT SURGERY PROGRESS NOTE Attending: Nolon Lennert, MDSUBJECTIVE Adam Harper is a 59 y.o. M 4 Days Post-Op w/ hx ESRD 2/2 FSGS s/p DDKT (L kidney to RIF, 1A/1V/1U - stented) w/ Campath induction.- Yesterday: Foley removed, now voiding spontaneously w/o difficulty- NAEON- Afebrile, BP 140s-170s, HR stable- Pain well controlled- UOP: 1.9L/24h, 150cc last shift after IV lasix 120mg - JP output: 60cc/24h- Cr trending up to 11.47 (10.29)- AM tac level 4.3OBJECTIVE Physical ExamGeneral: Alert, appropriate affect. Sitting upright in char, NAD.HEENT: NC/AT.Pulm: Normal inspiratory effort on RA. Cardio: Regular rate.Abdomen: Abdomen soft, nondistended. Incision w/ staples c/d/I. Appropriately tender to palpation. JP w/ serous output.GU: Voiding independently.Extremities: Warm, well perfused, without edema x 4.VitalsTemp:  [97.3 ???F (36.3 ???C)-98.4 ???F (36.9 ???C)] 97.7 ???F (36.5 ???C)Pulse:  [75-86] 78Resp:  [18-21] 18BP: (140-177)/(83-106) 140/83SpO2:  [94 %-97 %] 95 %Device (Oxygen Therapy): room airI/O last 3 completed shifts:In: 1680 [P.O.:1680]Out: 2206 [Urine:1938; Emesis/NG output:238; Other:30]LabsRecent Labs Lab 08/25/210535 08/26/210513 08/27/210531 WBC 13.8* 9.9 8.8 HGB 11.0* 10.7* 11.0* HCT 33.0* 31.7* 33.0* PLT 214 200 202 Recent Labs Lab 08/25/210535 08/26/210513 08/26/211715 08/26/212029 08/27/210531 NA 131* 137  --   --  135* K 6.0* 4.9  --   --  4.6 CL 92* 97*  --   --  94* CO2 17* 23  --   --  22 BUN 69* 56*  --   --  80* CREATININE 14.26* 10.29*  --   --  11.47* GLU 119* 114* 142* 155* 112* CALCIUM 9.4 9.6  --   --  9.4 MG 2.2 2.2  --   --  2.0 PHOS 7.1* 6.8*  --   --  7.3* Recent Labs Lab 08/23/210055 ALT 18 AST 17 ALKPHOS 67 BILITOT 0.4 Recent Labs Lab 08/23/210055 PTT 26.0 INR 1.06 ImagingNo results found.ASSESSMENT AND PLAN [name] is a 60 y.o. M 4 Days Post-Op w/ hx ESRD 2/2 FSGS s/p DDKT (L kidney to RIF, 1A/1V/1U - stented) w/ Campath induction. Overall recovering well. Plan for d/c tomorrow.Immunosuppression: 	Tac 7/7 - follow daily Fk levels.	MMF 1g BID.	Methylpred to pred taper per protocol.PPX: Fluconazole, Bactrim, Valcyte.Neuro: Pain control with ATC tylenol, tramadol ss prn.CV:  BP fairly controlled, continue norvasc 10mg  daily. VSq4h.Resp: Stable on room air. Encouraged use of IS, OOB and ambulate	F/u w/ sleep medicine outpt for OSA on CPAP.GI: Diet modified regular. Protonix for PPX. Bowel regimen per protocol.GU: s/p DDKT. Strict Is and Os. Daily weights 	Phoslo TID.	Heme: H/H stable. Endo: Blood sugar stable. Fingerstick per protocol. F/E/N: AM labs. Replace lytes as needed.DVT/PE PPX: HSQ/IS/SCDs/OOB/ambulation.Dispo: FloorMultidisciplinary team rounds were conducted and plan of care and discharge needs were discussed with transplant surgeon, nephrologist/hepatologist, transplant APP, nursing, social work, Journalist, newspaper, resident team, pharmacist and Gaffer.Final plan per attending attestation/addendum.Signed:Roxanne Judeth Horn, PA-CTransplant Surgery Pager: [phone]/27/202111:01 AMATTENDING ATTESTATIONI examined the patient and interviewed the patient on 11/09/2019.  I have read and edited the practitioner's note.  I agree with all elements of the note.  Adam Harper is a 59 y.o. 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  The patient's post-operative course has been stable.  The patient is now POD4.PHYSICAL EXAMAPPEARANCE		in no apparent distressNEUROLOGIC		Interactive, alert and orientedPULMONARY		No respiratory distressCARDIAC		ABDOMINAL		Obese abdomen with large panus.  Right lower quadrant transplant incision clean, dry, and intactJackson pratt drains in place, productive of thin serosanguinous fluid. EXTREMITIES		No edema in bilateral lower extremities.  No clubbing or cyanosis. ASSESSMENT / PLANLuis  A Harper is a 59 y.o. patient who is POD4 from renal transplant.  The renal allograft is functioning reasonably well with strong urine output and elevated creatinine.  The patient is progressing well.  Underwent HD 8/25 form Potassium 6.0. OK to discharge to home 8/28/2021Renal diet. Out of bed to ambulate TIDAppreciate input from Transplant Nephrology Service. Continue to check daily Prograf level and dose to achieve a Prograf level of 8-10.CellCept 1000 mg BIDSolumedrol steroid taperBart Pascal Lux, MDTransplant Surgery

## 2019-11-14 NOTE — Progress Notes
[facility]	 [facility]	TRANSPLANT SURGERY PROGRESS NOTEAttending Provider: Astrid Divine I*History of Present Illness: Adam Harper is a 59 y.o. male with history of ESRD 2/2 FSGS s/p DDKT (Left kidney to R iliac fossa, 1A/1V/1U-stented) on 11/05/2019 with Campath induction.3 Days Post-Op Total LOS: 4 days Interim History/Subjective: -No acute events overnight. -Afebrile. Hemodynamically stable. -Pain well controlled. -HD yesterday -Urine output adequate with one dose of Lasix 120mg  IV yesterday- UOP 1876ml/24 hours-JP drain output 110ml/24 hoursReview of Allergies/Meds/Hx: I have reviewed the patient's: allergies, current scheduled medications, current infusions, current prn medications, past medical history, past surgical history and prior to admission medicationsMeds: Scheduled Meds:Current Facility-Administered Medications Medication Dose Route Frequency Provider Last Rate Last Admin ??? acetaminophen (TYLENOL) tablet 650 mg  650 mg Oral Q6H Tommas Olp, MD   650 mg at 11/08/19 0548 ??? calcium acetate(phosphat bind) (PHOSLO) capsule 667 mg  667 mg Oral TID WC Harris, Apphia, APRN   667 mg at 11/08/19 0811 ??? fluconazole (DIFLUCAN) tablet 100 mg  100 mg Oral Daily Tommas Olp, MD   100 mg at 11/08/19 1610 ??? furosemide (LASIX) injection 120 mg  120 mg Intravenous Once Sue Lush, APRN     ??? heparin (porcine) injection 5,000 Units  5,000 Units Subcutaneous Damaris Schooner, MD   5,000 Units at 11/08/19 0548 ??? [START ON 11/09/2019] methylPREDNISolone PF (Solu-MEDROL) injection 80 mg  80 mg IV Push Daily Tommas Olp, MD      Followed by ??? Melene Muller ON 11/10/2019] predniSONE (DELTASONE) tablet 10 mg  10 mg Oral Daily Tommas Olp, MD     ??? mycophenolate mofetil (CELLCEPT) capsule 1 g  1 g Oral Q12H Tommas Olp, MD   1 g at 11/08/19 9604 ??? pantoprazole (PROTONIX) EC tablet 40 mg  40 mg Oral Daily Mirasol, Joynell, APRN   40 mg at 11/08/19 5409 ??? polyethylene glycol (MIRALAX) packet 17 g  17 g Oral Daily Mirasol, Joynell, APRN   17 g at 11/08/19 8119 ??? senna-docusate (SENNA-PLUS) 8.6-50 mg per tablet 1 tablet  1 tablet Oral Nightly Mirasol, Joynell, APRN   1 tablet at 11/07/19 2104 ??? [START ON 11/09/2019] sulfamethoxazole-trimethoprim (BACTRIM DS;CO-TRIMOXAZOLE DS) 800-160 mg per tablet 1 tablet  1 tablet Oral Once per day on Mon Wed Fri Harris, Apphia, APRN     ??? tacrolimus (PROGRAF) immediate release capsule 7 mg  7 mg Oral Q12H (0600-1800) Sue Lush, APRN     ??? valGANciclovir (VALCYTE) tablet 450 mg  450 mg Oral Q Mon & Thurs (0900) Harris, Apphia, APRN     Continuous Infusions:PRN Meds:ondansetron **OR** ondansetron (PF), traMADoL, traMADoLObjective: Vitals:Last 24 hours: Temp:  [96.2 ???F (35.7 ???C)-98.4 ???F (36.9 ???C)] 98.4 ???F (36.9 ???C)Pulse:  [70-108] 75Resp:  [18-20] 20BP: (126-178)/(77-101) 167/98SpO2:  [94 %-95 %] 95 %I/O's:Gross Totals (Last 24 hours) at 11/08/2019 1055Last data filed at 11/08/2019 1015Intake 2020 ml Output 1888 ml Net 132 ml Physical Exam:Gen: Awake, alert, no acute distress, resting comfortably in chairLungs: Normal respiratory effort on RAHeart: RRRAbd: soft, appropriately tender, mildly distended. Incision sites well approximated with staples. JP drain with serosanguinous drainageGU: Foley producing yellow urineExt: warm, well perfused x4, mild generalized edemaNeuro: AOx4, no focal deficitsLabs:Recent Labs Lab 08/24/210524 08/25/210535 08/26/210513 WBC 11.6* 13.8* 9.9 HGB 11.5* 11.0* 10.7* HCT 33.9* 33.0* 31.7* PLT 225 214 200 Recent Labs Lab 08/24/210524 08/24/210755 08/24/211818 08/25/210535 08/25/212105 08/26/210513 08/26/210755 NA 132*   < > 132* 131*  --  137  --  K 5.5*   < >  5.4* 6.0*  --  4.9  --  CL 94*   < > 93* 92*  --  97*  --  CO2 19*   < > 18* 17*  --  23  --  BUN 45*   < > 54* 69*  --  56*  --  CREATININE 12.55*   < > 13.49* 14.26*  --  10.29*  --  GLU 124*   < > 136* 119* 140* 114* 104* CALCIUM 9.2   < > 9.9 9.4  --  9.6  --  MG 1.9  --   --  2.2  --  2.2  --  PHOS 4.2  --   --  7.1*  --  6.8*  --   < > = values in this interval not displayed. Recent Labs Lab 08/23/210055 ALT 18 AST 17 ALKPHOS 67 BILITOT 0.4 Recent Labs Lab 08/23/210055 PTT 26.0 INR 1.06 Recent Labs Lab 08/25/210535 08/26/210513 TACROLIMUS 1.1* 2.1* Imaging:No results found.Assessment/Plan: Adam Harper is a 59 y.o. 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 Patient is doing well. Pain is well controlled and urine output improving. He did need session of HD on 8/25 for hyperkalemia. He is OOB, ambulating and tolerating a regular diet.Immunosuppression: Increase Tacrolimus to 7/7- follow daily FK levelsMMF 1000/1000Pred taper per protocolPPx:  	CMV serostatus D+/R+; Toxo IgG D+/R-	Fluconazole daily	Start Valcyte and Bactrim DS MWF todayNeuro/Pain  Alert and oriented. No issues.-Tylenol/TramadolCV:  BP controlled, continue to monitorResp: stable on room air. Encourage use of IS, OOB and ambulate-CPAP at nightF/E/N: Daily AM labs. Replete lytes as needed-continue Phoslo 667mg  TIDGI: Diet Modified Regular-Protonix for GI ppx-Bowel regimen per protocolGU: s/p DDKT. Creatinine 10.29 (14.26)-HD yesterday. -Lasix 120mg  IV-remove foley today- will perform fill and pull-Strict I/Os. -Daily standing weights. 	Heme: H/H stable. Endo: Blood sugar stable. Fingerstick per protocol. Proph: Hep SQ/Protonix/SCDs/IS. Dispo: Continue supportive careCode: Full interventionsMultidisciplinary team rounds were conducted and plan of care and discharge needs were discussed with transplant surgeon, nephrologist, transplant APRN, nursing, social work, Journalist, newspaper, resident team, pharmacist and care coordinator.Final plan per attending addendum/attestation.Signed: Sue Lush, APRN8/26/2021 10:55 AMTransplant SurgeryContact Number: MHBATTENDING ATTESTATIONI examined the patient and interviewed the patient on 11/08/2019.  I have read and edited the practitioner's note.  I agree with all elements of the note.  Adam Harper is a 59 y.o. 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  The patient's post-operative course has been stable.  The patient is now POD3.PHYSICAL EXAMAPPEARANCE		in no apparent distressNEUROLOGIC		Interactive, alert and orientedPULMONARY		No respiratory distressCARDIAC		ABDOMINAL		Obese abdomen with large panus.  Right lower quadrant transplant incision clean, dry, and intactJackson pratt drains in place, productive of thin serosanguinous fluid. EXTREMITIES		No edema in bilateral lower extremities.  No clubbing or cyanosis. ASSESSMENT / PLANLuis A Harper is a 59 y.o. patient who is POD3 from renal transplant.  The renal allograft is functioning reasonably well with strong urine output and elevated creatinine.  The patient is progressing well.  Underwent HD 8/25 form Potassium 6.0. OK to D/C FoleyRenal diet. Out of bed to ambulate TIDAppreciate input from Transplant Nephrology Service. Continue to check daily Prograf level and dose to achieve a Prograf level of 8-10.CellCept 1000 mg BIDSolumedrol steroid taperBart Pascal Lux, MDTransplant Surgery

## 2019-11-15 ENCOUNTER — Encounter: Admit: 2019-11-15 | Payer: PRIVATE HEALTH INSURANCE | Attending: Nephrology | Primary: Family Medicine

## 2019-11-15 ENCOUNTER — Encounter: Admit: 2019-11-15 | Payer: PRIVATE HEALTH INSURANCE | Primary: Family Medicine

## 2019-11-15 ENCOUNTER — Ambulatory Visit: Admit: 2019-11-15 | Payer: PRIVATE HEALTH INSURANCE | Attending: Nephrology | Primary: Family Medicine

## 2019-11-15 ENCOUNTER — Inpatient Hospital Stay: Admit: 2019-11-15 | Discharge: 2019-11-15 | Payer: PRIVATE HEALTH INSURANCE | Primary: Family Medicine

## 2019-11-15 DIAGNOSIS — K219 Gastro-esophageal reflux disease without esophagitis: Secondary | ICD-10-CM

## 2019-11-15 DIAGNOSIS — E669 Obesity, unspecified: Secondary | ICD-10-CM

## 2019-11-15 DIAGNOSIS — D649 Anemia, unspecified: Secondary | ICD-10-CM

## 2019-11-15 DIAGNOSIS — Z79899 Other long term (current) drug therapy: Secondary | ICD-10-CM

## 2019-11-15 DIAGNOSIS — N186 End stage renal disease: Secondary | ICD-10-CM

## 2019-11-15 DIAGNOSIS — I770 Arteriovenous fistula, acquired: Secondary | ICD-10-CM

## 2019-11-15 DIAGNOSIS — G4733 Obstructive sleep apnea (adult) (pediatric): Secondary | ICD-10-CM

## 2019-11-15 DIAGNOSIS — N2581 Secondary hyperparathyroidism of renal origin: Secondary | ICD-10-CM

## 2019-11-15 DIAGNOSIS — I712 Thoracic aortic aneurysm (HC Code): Secondary | ICD-10-CM

## 2019-11-15 DIAGNOSIS — I10 Essential (primary) hypertension: Secondary | ICD-10-CM

## 2019-11-15 DIAGNOSIS — Z2989 Need for prophylactic immunotherapy: Secondary | ICD-10-CM

## 2019-11-15 DIAGNOSIS — K7460 Unspecified cirrhosis of liver: Secondary | ICD-10-CM

## 2019-11-15 DIAGNOSIS — T827XXA Infection and inflammatory reaction due to other cardiac and vascular devices, implants and grafts, initial encounter: Secondary | ICD-10-CM

## 2019-11-15 DIAGNOSIS — Z940 Kidney transplant status: Secondary | ICD-10-CM

## 2019-11-15 DIAGNOSIS — K6289 Other specified diseases of anus and rectum: Secondary | ICD-10-CM

## 2019-11-15 DIAGNOSIS — Z298 Encounter for other specified prophylactic measures: Secondary | ICD-10-CM

## 2019-11-15 DIAGNOSIS — E7800 Pure hypercholesterolemia, unspecified: Secondary | ICD-10-CM

## 2019-11-15 DIAGNOSIS — J101 Influenza due to other identified influenza virus with other respiratory manifestations: Secondary | ICD-10-CM

## 2019-11-15 DIAGNOSIS — E213 Hyperparathyroidism, unspecified: Secondary | ICD-10-CM

## 2019-11-15 LAB — CBC WITH AUTO DIFFERENTIAL
BKR ANION GAP: 13.9 % (ref 11.5–14.5)
BKR SARS-COV-2 RNA (COVID-19) (YH): 0.6 % — ABNORMAL LOW (ref 8.0–49.0)
BKR WAM ABSOLUTE IMMATURE GRANULOCYTES: 0.1 x 1000/??L (ref 0.0–0.3)
BKR WAM ABSOLUTE LYMPHOCYTE COUNT: 0 x 1000/ÂµL — ABNORMAL LOW (ref 1.0–4.0)
BKR WAM ABSOLUTE NRBC: 0 x 1000/??L (ref 0.0–0.0)
BKR WAM ANALYZER ANC: 4.3 x 1000/ÂµL (ref 1.0–11.0)
BKR WAM BASOPHIL ABSOLUTE COUNT: 0 x 1000/ÂµL (ref 0.0–0.0)
BKR WAM BASOPHILS: 0.2 % (ref 0.0–4.0)
BKR WAM EOSINOPHIL ABSOLUTE COUNT: 0 x 1000/ÂµL (ref 0.0–1.0)
BKR WAM EOSINOPHILS: 0.2 % (ref 0.0–7.0)
BKR WAM HEMATOCRIT: 33.1 % — ABNORMAL LOW (ref 37.0–52.0)
BKR WAM HEMOGLOBIN: 10.6 g/dL — ABNORMAL LOW (ref 12.0–18.0)
BKR WAM IMMATURE GRANULOCYTES: 1.7 % (ref 0.0–3.0)
BKR WAM LYMPHOCYTES: 0.6 % — ABNORMAL LOW (ref 8.0–49.0)
BKR WAM MCH (PG): 34.9 pg — ABNORMAL HIGH (ref 27.0–31.0)
BKR WAM MCHC: 32 g/dL (ref 31.0–36.0)
BKR WAM MCV: 108.9 fL — ABNORMAL HIGH (ref 78.0–94.0)
BKR WAM MONOCYTE ABSOLUTE COUNT: 0.2 x 1000/ÂµL (ref 0.0–2.0)
BKR WAM MONOCYTES: 5.1 % (ref 4.0–15.0)
BKR WAM NEUTROPHILS: 92.2 % — ABNORMAL HIGH (ref 37.0–84.0)
BKR WAM NUCLEATED RED BLOOD CELLS: 0 % (ref 0.0–1.0)
BKR WAM PLATELETS: 272 x1000/ÂµL (ref 140–440)
BKR WAM RDW-CV: 13.9 % (ref 11.5–14.5)
BKR WAM RED BLOOD CELL COUNT: 3 M/??L — ABNORMAL LOW (ref 3.8–5.9)
BKR WAM WHITE BLOOD CELL COUNT: 4.7 x1000/ÂµL (ref 4.0–10.0)
FAMILIAL HYPERINSULINISM: 0 x 1000/??L (ref 0.0–0.0)

## 2019-11-15 LAB — TACROLIMUS LEVEL     (BH GH L LMW YH): BKR TACROLIMUS BLOOD: 9.9 ng/mL

## 2019-11-15 LAB — BASIC METABOLIC PANEL
BKR BLOOD UREA NITROGEN: 85 mg/dL — ABNORMAL HIGH (ref 6–20)
BKR BUN / CREAT RATIO: 12.1 (ref 8.0–23.0)
BKR CALCIUM: 9.5 mg/dL (ref 8.8–10.2)
BKR CHLORIDE: 102 mmol/L (ref 98–107)
BKR CO2: 21 mmol/L (ref 20–30)
BKR CREATININE: 7 mg/dL — CR (ref 0.40–1.30)
BKR EGFR (AFR AMER): 10 mL/min/{1.73_m2} (ref >60)
BKR EGFR (NON AFRICAN AMERICAN): 8 mL/min/{1.73_m2} (ref >60)
BKR POTASSIUM: 5.1 mmol/L (ref 3.3–5.1)
BKR SODIUM: 136 mmol/L (ref 136–144)

## 2019-11-15 LAB — PHOSPHORUS     (BH GH L LMW YH): BKR PHOSPHORUS: 4.5 mg/dL (ref 2.2–4.5)

## 2019-11-15 LAB — MAGNESIUM: BKR MAGNESIUM: 2 mg/dL (ref 1.7–2.4)

## 2019-11-15 NOTE — Progress Notes
Labs reviewed with transplant nephrologist/surgeon Dr. Drue Second, no changes were made.Electronically Signed by Mikle Bosworth, RN, November 15, 2019

## 2019-11-15 NOTE — Progress Notes
Received critical creatinine from chemistry labs. Creatinine is 7.0 - from 10.57. nephrologist aware

## 2019-11-16 ENCOUNTER — Telehealth: Admit: 2019-11-16 | Payer: PRIVATE HEALTH INSURANCE | Primary: Family Medicine

## 2019-11-16 DIAGNOSIS — I10 Essential (primary) hypertension: Secondary | ICD-10-CM

## 2019-11-16 DIAGNOSIS — Z2989 Need for prophylactic immunotherapy: Secondary | ICD-10-CM

## 2019-11-16 DIAGNOSIS — R197 Diarrhea, unspecified: Secondary | ICD-10-CM

## 2019-11-16 DIAGNOSIS — Z940 Kidney transplant status: Secondary | ICD-10-CM

## 2019-11-16 NOTE — Telephone Encounter
LogEventMarland Kitchen [name] #: [id number] Sent: 11/16/2019 07:21 MTCaller Name: Wymon Swaney: [phone] Ext: From: Willodean Rosenthal Type: General MessageMessage: Pt: Name: Diego Cory: April 20, 1962MD:[facility] are [facility] at [facility] in [place], correct? YesPost KidneyRE: Very hard to understand. Needs a medication refilled/unsure of the name. When I asked him the name of his Pharmacy, he did not know it/said it's mailed to him. Please callSpanish Interpreter: [phone] Called and spoke with pt. Needing coreg and amlodipine. meds already in system. Will reach out to OPS to arrange delivery.

## 2019-11-16 NOTE — Progress Notes
[facility] Transplantation CenterKidney Transplant ProgramPost Transplant Nursing Assessment and Education  Nursing Assessment:  Adam Harper (59 y.o. 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 ~1200 cc GI: Moving bowels Stent: still in Edema: None Objective Data:  Vital Signs: BP (!) 140/78  - Pulse 67  - Temp (!) 96.6 ???F (35.9 ???C) (Temporal)  - Ht 5' 9\ (1.753 m)  - Wt 115.8 kg  - SpO2 100%  - BMI 37.69 kg/m???  Most recent creatinine: Lab Results Component Value Date  CREATININE 10.57 (HH) 11/12/2019 Lab Results Component Value Date  TACROLIMUS 7.0 11/12/2019 Immunosuppression:  Current Transplant Immunosuppression:Immunosuppression     Start End  mycophenolate mofetil (CELLCEPT) 250 mg capsule 11/06/2019   Sig - Route: Take 4 capsules (1,000 mg total) by mouth 2 (two) times daily. - Oral  Notes to Pharmacy: Kidney Transplant Z94.0  predniSONE (DELTASONE) 5 mg tablet 11/10/2019   Sig - Route: Take 2 tablets (10 mg total) by mouth daily. Take with food. - Oral  Notes to Pharmacy: Kidney Transplant Z94.0  tacrolimus (PROGRAF) 1 mg immediate release capsule 11/12/2019 11/11/2020  Sig - Route: Take 9 capsules (9 mg total) by mouth 2 (two) times daily. - Oral  Notes to Pharmacy: Kidney Transplant Z94.0  Cosign for Ordering: Accepted by Dawayne Patricia, MD on 11/12/2019 10:12 PM  Transplant Prophylaxis       ciprofloxacin HCl (CIPRO) 500 mg tablet Take 1 tablet (500 mg total) by mouth 2 (two) times daily. DO NOT TAKE UNTIL PRESCRIBER NOTIFIES YOU  Number of times this order has been changed since signing: 6   Order Audit Trail   fluconazole (DIFLUCAN) 100 mg tablet Take 1 tablet (100 mg total) by mouth daily.  Number of times this order has been changed since signing: 10   Order Audit Trail   sulfamethoxazole-trimethoprim (BACTRIM,SEPTRA) 400-80 mg per tablet Take 1 tablet by mouth daily.  Number of times this order has been changed since signing: 9   Order Audit Trail   valGANciclovir (VALCYTE) 450 mg tablet Take 1 tablet (450 mg total) by mouth Every Monday and Thursday.  Number of times this order has been changed since signing: 9   Order Audit Trail     No Known AllergiesAnti-viral Prophylaxis:ValcytePCP Prophylaxis: BactrimAntifungal Prophylaxis:Current IS Regimen:Tac 9/9 -->  This was decreased from 8/8 to 9/9, pt did not received the message. He still taking Tac 8/8MMF 1 g BIDPred 10 mg daily Prograf level is a 12 hour trough.Transplant graft is functioning. Plan: Review labs with clinic attending. Adam Harper will be notified of any immunosuppression changes/treatments based on lab results.Adam Harper was provided education on maintaining medication compliance and post-transplant care.  All questions pertaining to post transplant care were answered. Patient was advised to contact the transplant center with any concerns. The patient verbalized understanding and intent to adhere to the transplant plan of care. Electronically Signed by Ivar Drape, RN, November 15, 2019

## 2019-11-16 NOTE — Progress Notes
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  He does report compliance with his immunosuppressive medications.    He does not report drug related side effects.    Is doing well overall with no new complaints this visit.   BP control at home is quite good on current therapy.   No nausea / fluid retention symptoms.     No reports of fever.   No reports of nausea, vomiting or diarrhea.   No reports of shortness of breath, chest pain.     PFSH recorded in the EMR was reviewed with the patient - there are no changes or additions since the last update.     Review of Systems:     Review of Systems   Constitutional: Negative for chills, fever and weight loss.   HENT: Negative for hearing loss.    Eyes: Negative.    Respiratory: Negative for cough, hemoptysis and sputum production.    Cardiovascular: Negative.  Negative for chest pain, palpitations, orthopnea, claudication and leg swelling.   Gastrointestinal: Negative for abdominal pain, blood in stool, constipation and diarrhea.   Musculoskeletal: Negative.    Skin: Negative for rash.   Neurological: Negative for dizziness, tremors and headaches.   Psychiatric/Behavioral: Negative for depression.       Physical Exam:     Vitals: There were no vitals taken for this visit.    Wt: 11/12/19 117 kg   11/10/19 116.8 kg   10/02/19 119.3 kg   10/02/19 119.3 kg   10/02/19 117.9 kg     General : Appears well, not in distress, appropriately conversant.   HEENT : EOMI , mucous membranes moist   Respiratory : NVBS heard bilaterally, no adventitious breath sounds heard.   Cardiovascular: S1 S2 + , no added sounds heard.   Abdomen : Soft, non-tender, graft site non-tender. Healing well.   Extremities : No edema  Integument : No rashes on limited skin exam    Impression & Plan:     # ALLOGRAFT FUNCTION   11/05/2019 (Kidney)  Cr down to 7.   Electrolytes and acid base at goal  Urine Pr/Cr to be checked today.   Stent appointment 09/15     # IMMUNOSUPPRESSION    Tac C12 level is therapeutic   MMF 1000/1000   Pred 10 mg     # PROPHYLAXIS   Fungal x 1 month   Bacterial / Toxo : Bactrim   Viral :  CMV D + / R + , Valcyte 450 QMon/thu  Screening for BK to begin in 1 month     # HYPERTENSION  BP at home is well controlled  Will continue the current regimen    # HEMATOLOGY  Hb / WBC / Platelets  Stable.     # MINERAL BONE DISEASE   Deferred for now     # HEALTHCARE MAINTENANCE    Deferred to PCP, [name]    [ ]  Needs prompt referral and management for OSA - will start with pulmonology / sleep medicine     Follow up on Monday     Adam Harper's recent laboratory studies were reviewed in the EMR flow sheet.   In addition, past records were reviewed as a component of decision making for today's visit.    Problem List   Patient Active Problem List   Diagnosis   ??? FSGS (focal segmental glomerulosclerosis)   ??? Hyperparathyroidism (HC Code)   ??? ROD (renal osteodystrophy)   ??? Hypertension   ??? Hypertriglyceridemia   ??? Obese   ??? 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 Colon polyps   ??? Internal hemorrhoids   ??? Mediastinal mass   ??? Arteriovenous fistula stenosis, subsequent encounter   ??? AVF (arteriovenous fistula) (HC Code)   ??? 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 Alkaline phosphatase raised   ??? Decreased testosterone level   ??? Gastroesophageal reflux disease   ??? Lipoprotein deficiency disorder   ??? Newborn affected by abnormality in fetal (intrauterine) heart rate or rhythm, unspecified as to time of onset   ??? Kidney replaced by transplant          Medications   Current Outpatient Medications on File Prior to Visit   Medication Sig Dispense Refill   ??? acetaminophen (TYLENOL) 325 mg tablet Take 2 tablets (650 mg total) by mouth every 6 (six) hours as needed for up to 10 days. 30 tablet 11   ??? amLODIPine (NORVASC) 10 mg tablet Take 1 tablet (10 mg total) by mouth every morning. 90 tablet 3   ??? atorvastatin (LIPITOR) 40 mg tablet Take 1 tablet (40 mg total) by mouth daily. 30 tablet 5   ??? calcium acetate,phosphat bind, (PHOSLO) 667 mg tablet Take 3 tabs with each meal and 2 tabs with each snack, total 11 per day 330 tablet 11   ??? carvediloL (COREG) 25 mg Immediate Release tablet Take 1 tablet (25 mg total) by mouth 2 (two) times daily with breakfast and dinner. 60 tablet 11   ??? ciprofloxacin HCl (CIPRO) 500 mg tablet Take 1 tablet (500 mg total) by mouth 2 (two) times daily. DO NOT TAKE UNTIL PRESCRIBER NOTIFIES YOU (Patient not taking: Reported on 11/12/2019) 4 tablet 0   ??? fluconazole (DIFLUCAN) 100 mg tablet Take 1 tablet (100 mg total) by mouth daily. 30 tablet 0   ??? mycophenolate mofetil (CELLCEPT) 250 mg capsule Take 4 capsules (1,000 mg total) by mouth 2 (two) times daily. 240 capsule 11   ??? omeprazole (PRILOSEC) 20 mg capsule Take 1 capsule (20 mg total) by mouth daily. 90 capsule 1   ??? predniSONE (DELTASONE) 5 mg tablet Take 2 tablets (10 mg total) by mouth daily. Take with food. 60 tablet 11   ??? senna-docusate (SENNA-PLUS) 8.6-50 mg tablet Take 1 tablet by mouth nightly. (Patient not taking: Reported on 11/12/2019)     ??? sulfamethoxazole-trimethoprim (BACTRIM,SEPTRA) 400-80 mg per  tablet Take 1 tablet by mouth daily. 30 tablet 5   ??? tacrolimus (PROGRAF) 1 mg immediate release capsule Take 9 capsules (9 mg total) by mouth 2 (two) times daily. 540 capsule 11   ??? valGANciclovir (VALCYTE) 450 mg tablet Take 1 tablet (450 mg total) by mouth Every Monday and Thursday. 60 tablet 2     No current facility-administered medications on file prior to visit.        Allergies   No Known Allergies     Electronically Signed :     Stanford Scotland, MD  Transplant Nephrology   11/15/2019

## 2019-11-17 ENCOUNTER — Emergency Department: Admit: 2019-11-17 | Payer: PRIVATE HEALTH INSURANCE | Primary: Family Medicine

## 2019-11-17 ENCOUNTER — Inpatient Hospital Stay
Admit: 2019-11-17 | Discharge: 2019-11-20 | Payer: PRIVATE HEALTH INSURANCE | Source: Home / Self Care | Admitting: Transplant Surgery

## 2019-11-17 DIAGNOSIS — E875 Hyperkalemia: Secondary | ICD-10-CM

## 2019-11-17 LAB — URINE MICROSCOPIC     (BH GH LMW YH)
BKR HYALINE CASTS, UA INSTRUMENT (NUMERIC): 1 /LPF (ref 0–3)
BKR RBC/HPF INSTRUMENT: 28 /HPF — ABNORMAL HIGH (ref 0–2)
BKR WBC/HPF INSTRUMENT: 4 /HPF (ref 0–5)

## 2019-11-17 LAB — HEPATIC FUNCTION PANEL
BKR A/G RATIO: 1.4 % — ABNORMAL LOW (ref 1.0–2.2)
BKR ALANINE AMINOTRANSFERASE (ALT): 20 U/L (ref 9–59)
BKR ALBUMIN: 4.2 g/dL (ref 3.6–4.9)
BKR ALKALINE PHOSPHATASE: 63 U/L (ref 9–122)
BKR ASPARTATE AMINOTRANSFERASE (AST): 17 U/L (ref 10–35)
BKR AST/ALT RATIO: 0.9
BKR BILIRUBIN DIRECT: <0.2 mg/dL (ref <=0.3)
BKR BILIRUBIN TOTAL: 0.3 mg/dL (ref <=1.2)
BKR GLOBULIN: 2.9 g/dL — ABNORMAL LOW (ref 2.3–3.5)
BKR PROTEIN TOTAL: 7.1 g/dL (ref 6.6–8.7)

## 2019-11-17 LAB — CBC WITH AUTO DIFFERENTIAL
BKR WAM ABSOLUTE IMMATURE GRANULOCYTES: 0.1 x 1000/??L (ref 0.0–0.3)
BKR WAM ABSOLUTE LYMPHOCYTE COUNT: 0 x 1000/??L — ABNORMAL LOW (ref 1.0–4.0)
BKR WAM ABSOLUTE NRBC: 0 x 1000/??L (ref 0.0–0.0)
BKR WAM ANALYZER ANC: 8.4 x 1000/ÂµL (ref 1.0–11.0)
BKR WAM BASOPHIL ABSOLUTE COUNT: 0 x 1000/??L (ref 0.0–0.0)
BKR WAM BASOPHILS: 0.1 % (ref 0.0–4.0)
BKR WAM EOSINOPHIL ABSOLUTE COUNT: 0 x 1000/??L (ref 0.0–1.0)
BKR WAM EOSINOPHILS: 0 % (ref 0.0–7.0)
BKR WAM HEMATOCRIT: 32.7 % — ABNORMAL LOW (ref 37.0–52.0)
BKR WAM HEMOGLOBIN: 10.5 g/dL — ABNORMAL LOW (ref 12.0–18.0)
BKR WAM IMMATURE GRANULOCYTES: 0.7 % (ref 0.0–3.0)
BKR WAM MCH (PG): 34.8 pg — ABNORMAL HIGH (ref 27.0–31.0)
BKR WAM MCHC: 32.1 g/dL — ABNORMAL LOW (ref 31.0–36.0)
BKR WAM MCV: 108.3 fL — ABNORMAL HIGH (ref 78.0–94.0)
BKR WAM MONOCYTE ABSOLUTE COUNT: 0.1 x 1000/ÂµL (ref 0.0–2.0)
BKR WAM MONOCYTES: 1.5 % — ABNORMAL LOW (ref 4.0–15.0)
BKR WAM NEUTROPHILS: 97.4 % — ABNORMAL HIGH (ref 37.0–84.0)
BKR WAM NUCLEATED RED BLOOD CELLS: 0 % (ref 0.0–1.0)
BKR WAM PLATELETS: 271 x1000/ÂµL (ref 140–440)
BKR WAM RDW-CV: 13.7 % (ref 11.5–14.5)
BKR WAM RED BLOOD CELL COUNT: 3 M/??L — ABNORMAL LOW (ref 3.8–5.9)
BKR WAM WHITE BLOOD CELL COUNT: 8.6 x1000/??L (ref 4.0–10.0)

## 2019-11-17 LAB — BASIC METABOLIC PANEL
BKR ANION GAP: 13 (ref 7–17)
BKR BLOOD UREA NITROGEN: 66 mg/dL — ABNORMAL HIGH (ref 6–20)
BKR BUN / CREAT RATIO: 13.3 (ref 8.0–23.0)
BKR CALCIUM: 9.4 mg/dL — ABNORMAL HIGH (ref 8.8–10.2)
BKR CHLORIDE: 105 mmol/L (ref 98–107)
BKR CO2: 17 mmol/L — ABNORMAL LOW (ref 20–30)
BKR EGFR (AFR AMER): 15 mL/min/{1.73_m2} (ref >60)
BKR EGFR (NON AFRICAN AMERICAN): 12 mL/min/{1.73_m2} (ref >60)
BKR GLUCOSE: 128 mg/dL — ABNORMAL HIGH (ref 70–100)
BKR POTASSIUM: 8.1 mmol/L — CR (ref 3.3–5.1)
BKR SODIUM: 135 mmol/L — ABNORMAL LOW (ref 136–144)
BKR WAM LYMPHOCYTES: 13.3 % — ABNORMAL LOW (ref 8.0–23.0)

## 2019-11-17 LAB — URINALYSIS WITH CULTURE REFLEX      (BH LMW YH)
BKR BILIRUBIN, UA: NEGATIVE
BKR GLUCOSE, UA: NEGATIVE
BKR KETONES, UA: NEGATIVE x 1000/??L (ref 0.0–2.0)
BKR LEUKOCYTE ESTERASE, UA: NEGATIVE % (ref 0.0–4.0)
BKR NITRITE, UA: NEGATIVE
BKR PH, UA: 6 (ref 5.5–7.5)
BKR SPECIFIC GRAVITY, UA: 1.015 (ref 1.005–1.030)
BKR UROBILINOGEN, UA: <2 EU/dL (ref <=2.0)

## 2019-11-17 LAB — MAGNESIUM: BKR MAGNESIUM: 1.8 mg/dL (ref 1.7–2.4)

## 2019-11-17 LAB — LIPASE: BKR LIPASE: 46 U/L (ref 11–55)

## 2019-11-17 MED ORDER — AMLODIPINE 10 MG TABLET
10 mg | Freq: Every day | ORAL | Status: DC
Start: 2019-11-17 — End: 2019-11-20
  Administered 2019-11-18 – 2019-11-20 (×3): 10 mg via ORAL

## 2019-11-17 MED ORDER — FUROSEMIDE 10 MG/ML INJECTION SOLUTION
10 mg/mL | Freq: Once | INTRAVENOUS | Status: CP
Start: 2019-11-17 — End: ?
  Administered 2019-11-18: 03:00:00 10 mL via INTRAVENOUS

## 2019-11-17 MED ORDER — ACETAMINOPHEN 325 MG TABLET
325 mg | Freq: Four times a day (QID) | ORAL | Status: DC | PRN
Start: 2019-11-17 — End: 2019-11-20

## 2019-11-17 MED ORDER — INSULIN U-100 REGULAR HUMAN 100 UNIT/ML INJECTION SOLUTION
100 unit/mL | Freq: Once | INTRAVENOUS | Status: CP
Start: 2019-11-17 — End: ?
  Administered 2019-11-17: 23:00:00 100 mL via INTRAVENOUS

## 2019-11-17 MED ORDER — SODIUM CHLORIDE 0.9 % (FLUSH) INJECTION SYRINGE
0.9 % | Freq: Three times a day (TID) | INTRAVENOUS | Status: DC
Start: 2019-11-17 — End: 2019-11-19
  Administered 2019-11-18: 18:00:00 0.9 mL via INTRAVENOUS

## 2019-11-17 MED ORDER — DEXTROSE 50 % IN WATER (D50W) INTRAVENOUS SOLUTION
INTRAVENOUS | Status: DC | PRN
Start: 2019-11-17 — End: 2019-11-20
  Administered 2019-11-18: 21:00:00 50.000 mL via INTRAVENOUS

## 2019-11-17 MED ORDER — ENTERAL HYPOGLYCEMIA MANAGEMENT PRODUCT < 50 MG/DL
1 | ORAL | Status: DC | PRN
Start: 2019-11-17 — End: 2019-11-20

## 2019-11-17 MED ORDER — SODIUM CHLORIDE 0.9 % BOLUS (NEW BAG)
0.9 % | Freq: Once | INTRAVENOUS | Status: CP
Start: 2019-11-17 — End: ?
  Administered 2019-11-17: 22:00:00 0.9 mL/h via INTRAVENOUS

## 2019-11-17 MED ORDER — CARVEDILOL IMMEDIATE RELEASE 25 MG TABLET
25 mg | Freq: Two times a day (BID) | ORAL | Status: DC
Start: 2019-11-17 — End: 2019-11-20
  Administered 2019-11-18 – 2019-11-20 (×5): 25 mg via ORAL

## 2019-11-17 MED ORDER — CALCIUM GLUCONATE INJECTION
Freq: Once | INTRAVENOUS | Status: CP
Start: 2019-11-17 — End: ?
  Administered 2019-11-17: 22:00:00 20.000 mL via INTRAVENOUS

## 2019-11-17 MED ORDER — PREDNISONE 10 MG TABLET
10 mg | Freq: Every day | ORAL | Status: DC
Start: 2019-11-17 — End: 2019-11-20
  Administered 2019-11-18 – 2019-11-20 (×3): 10 mg via ORAL

## 2019-11-17 MED ORDER — DEXTROSE 50 % IN WATER (D50W) INTRAVENOUS SYRINGE
INTRAVENOUS | Status: AC | PRN
Start: 2019-11-17 — End: ?

## 2019-11-17 MED ORDER — GLUCAGON 1 MG/ML IN STERILE WATER
Freq: Once | INTRAMUSCULAR | Status: DC | PRN
Start: 2019-11-17 — End: 2019-11-20

## 2019-11-17 MED ORDER — FAMOTIDINE 20 MG TABLET
20 mg | Freq: Every day | ORAL | Status: DC
Start: 2019-11-17 — End: 2019-11-18
  Administered 2019-11-18: 12:00:00 20 mg via ORAL

## 2019-11-17 MED ORDER — DEXTROSE 50 % IN WATER (D50W) INTRAVENOUS SYRINGE
Freq: Once | INTRAVENOUS | Status: CP
Start: 2019-11-17 — End: ?
  Administered 2019-11-17: 23:00:00 50.000 mL via INTRAVENOUS

## 2019-11-17 MED ORDER — FLUCONAZOLE 100 MG TABLET
100 mg | ORAL | Status: DC
Start: 2019-11-17 — End: 2019-11-20
  Administered 2019-11-18 – 2019-11-20 (×3): 100 mg via ORAL

## 2019-11-17 MED ORDER — SODIUM ZIRCONIUM CYCLOSILICATE 10 GRAM ORAL POWDER PACKET
10 gram | Freq: Once | ORAL | Status: CP
Start: 2019-11-17 — End: ?
  Administered 2019-11-17: 22:00:00 10 gram via ORAL

## 2019-11-17 MED ORDER — SODIUM CHLORIDE 0.9 % INTRAVENOUS SOLUTION
INTRAVENOUS | Status: DC
Start: 2019-11-17 — End: 2019-11-18
  Administered 2019-11-18: 03:00:00 via INTRAVENOUS

## 2019-11-17 MED ORDER — ROSUVASTATIN 20 MG TABLET
20 mg | Freq: Every day | ORAL | Status: DC
Start: 2019-11-17 — End: 2019-11-20
  Administered 2019-11-18 – 2019-11-20 (×3): 20 mg via ORAL

## 2019-11-17 MED ORDER — VALGANCICLOVIR 450 MG TABLET
450 mg | ORAL | Status: DC
Start: 2019-11-17 — End: 2019-11-20
  Administered 2019-11-19: 12:00:00 450 mg via ORAL

## 2019-11-17 MED ORDER — MYCOPHENOLATE MOFETIL 250 MG CAPSULE
250 mg | Freq: Two times a day (BID) | ORAL | Status: DC
Start: 2019-11-17 — End: 2019-11-18

## 2019-11-17 MED ORDER — FUROSEMIDE 10 MG/ML INJECTION SOLUTION
10 mg/mL | Freq: Once | INTRAVENOUS | Status: CP
Start: 2019-11-17 — End: ?
  Administered 2019-11-17: 23:00:00 10 mL via INTRAVENOUS

## 2019-11-17 MED ORDER — HEPARIN (PORCINE) 5,000 UNIT/ML INJECTION SOLUTION
5000 unit/mL | Freq: Three times a day (TID) | SUBCUTANEOUS | Status: DC
Start: 2019-11-17 — End: 2019-11-20
  Administered 2019-11-18 – 2019-11-20 (×7): via SUBCUTANEOUS

## 2019-11-17 MED ORDER — ENTERAL HYPOGLYCEMIA MANAGEMENT PRODUCT
1 | ORAL | Status: DC | PRN
Start: 2019-11-17 — End: 2019-11-20

## 2019-11-17 MED ORDER — DEXTROSE 50 % IN WATER (D50W) INTRAVENOUS SOLUTION
INTRAVENOUS | Status: DC | PRN
Start: 2019-11-17 — End: 2019-11-20

## 2019-11-17 MED ORDER — INSULIN U-100 REGULAR HUMAN 100 UNIT/ML (SLIDING SCALE)
100 unit/mL | Freq: Four times a day (QID) | SUBCUTANEOUS | Status: DC
Start: 2019-11-17 — End: 2019-11-19

## 2019-11-17 MED ORDER — SODIUM CHLORIDE 0.9 % (FLUSH) INJECTION SYRINGE
0.9 % | INTRAVENOUS | Status: DC | PRN
Start: 2019-11-17 — End: 2019-11-19

## 2019-11-17 NOTE — ED Notes
5:44 PM Read and agree with RN note. IV placed. Blood obtained. 17:44 PM Pt iStat abn; MD Chekijain obtained. Pt denies active CP. 17:44 EKG obtained-placed in cardiac monitor, call light within reach.

## 2019-11-17 NOTE — ED Notes
5:24 PM Spanish interpreter claudio [id number]. Patient presented to ED with c/o weakness. Patient states I feel bad patient had kidney transplant twleve days ago reports having weakness and that he's unable to taste. Reports having multiple episodes of diarrhea, body aches / chills. Reports being able to urinate but having decreased urinary output. Denies any sick contacts. Denies chest pain / sob, denies n/v. Past Medical History: Diagnosis Date ? A-V fistula (HC Code)   right arm ? Anemia  ? 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 Overview:  Added automatically from request for surgery [id number] Overview:  Added automatically from request for surgery [id number] ? AV fistula infection (HC Code)  ? Cirrhosis (HC Code)  ? 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 GERD (gastroesophageal reflux disease)  ? Hypercholesteremia  ? Hyperparathyroidism (HC Code)  ? Hypertension  ? Infected prosthetic vascular graft, initial encounter (HC Code) 10/24/2014 ? Influenza A  ? Morbid obesity (HC Code) 02/03/2018 ? Obstructive sleep apnea   pt uses CPAP ? Secondary hyperparathyroidism of renal origin (HC Code)  ? Thoracic aortic aneurysm (HC Code)

## 2019-11-17 NOTE — Telephone Encounter
LogEventMarland Kitchen [name] #: [id number] Sent: 11/16/2019 20:41 MTCaller Name: Allen Egerton: [phone] TKZ:SWFU: Post KidneyMessage Type: General MessageMessage: Pt: Name: Adam Harper: [birth year]MD:You are seen at [facility] transplant in [place], correct? YesPost:Organ Type: KidneyRE: Is having pain and diarrhea is concerned it is from his medication. Needs a Spanish interpreterCalled patient with spanish interpreter ID [phone] Mardene Celeste) on the linePatient reports ongoing diarrhea for the past three days, had 5-7 episodes of diarrhea today. Denies fever, chills, chest pain or SReports he has increased his hydration.Dr. Alyssa Grove made aware. Reduce MMF from 1000 mg BID to 750 mg BID. Complete stool studies, well timed MPA level and labs. Called patient back with interpreter ID # [id number]. Reviewed above plan with patient and his wife.  Advised patient to call back if symptoms worsens. Both verbalized understanding.

## 2019-11-17 NOTE — ED Notes
6:07 PM Rectal temp obtained. 98.9

## 2019-11-18 LAB — CBC WITH AUTO DIFFERENTIAL
BKR EGFR (NON AFRICAN AMERICAN): 0 x 1000/??L (ref 0.0–0.0)
BKR WAM ABSOLUTE IMMATURE GRANULOCYTES: 0.1 x 1000/??L (ref 0.0–0.3)
BKR WAM ABSOLUTE LYMPHOCYTE COUNT: 0 x 1000/??L — ABNORMAL LOW (ref 1.0–4.0)
BKR WAM ABSOLUTE NRBC: 0 x 1000/ÂµL (ref 0.0–0.0)
BKR WAM ANALYZER ANC: 6.2 x 1000/??L — ABNORMAL HIGH (ref 1.0–11.0)
BKR WAM BASOPHIL ABSOLUTE COUNT: 0 x 1000/??L (ref 0.0–0.0)
BKR WAM EOSINOPHIL ABSOLUTE COUNT: 0 x 1000/??L (ref 0.0–1.0)
BKR WAM EOSINOPHILS: 0.2 % (ref 0.0–7.0)
BKR WAM HEMATOCRIT: 30.3 % — ABNORMAL LOW (ref 37.0–52.0)
BKR WAM HEMOGLOBIN: 10.3 g/dL — ABNORMAL LOW (ref 12.0–18.0)
BKR WAM IMMATURE GRANULOCYTES: 0.8 % (ref 0.0–3.0)
BKR WAM LYMPHOCYTES: 0.3 % — ABNORMAL LOW (ref 8.0–49.0)
BKR WAM MCH (PG): 36 pg — ABNORMAL HIGH (ref 27.0–31.0)
BKR WAM MCHC: 34 g/dL (ref 31.0–36.0)
BKR WAM MCV: 105.9 fL — ABNORMAL HIGH (ref 78.0–94.0)
BKR WAM MONOCYTE ABSOLUTE COUNT: 0.1 x 1000/ÂµL (ref 0.0–2.0)
BKR WAM MONOCYTES: 2.2 % — ABNORMAL LOW (ref 4.0–15.0)
BKR WAM MPV: 9.1 fL — ABNORMAL HIGH (ref 6.0–11.0)
BKR WAM NEUTROPHILS: 96.3 % — ABNORMAL HIGH (ref 37.0–84.0)
BKR WAM NUCLEATED RED BLOOD CELLS: 0 % (ref 0.0–1.0)
BKR WAM PLATELETS: 257 x1000/ÂµL (ref 140–440)
BKR WAM RDW-CV: 13.6 % (ref 11.5–14.5)
BKR WAM RED BLOOD CELL COUNT: 2.9 M/??L — ABNORMAL LOW (ref 3.8–5.9)
BKR WAM WHITE BLOOD CELL COUNT: 6.5 x1000/??L (ref 4.0–10.0)

## 2019-11-18 LAB — COMPREHENSIVE METABOLIC PANEL
BKR A/G RATIO: 1.3 (ref 1.0–2.2)
BKR ALANINE AMINOTRANSFERASE (ALT): 16 U/L (ref 9–59)
BKR ALBUMIN: 3.9 g/dL (ref 3.6–4.9)
BKR ALKALINE PHOSPHATASE: 65 U/L (ref 9–122)
BKR ANION GAP: 14 % (ref 7–17)
BKR ASPARTATE AMINOTRANSFERASE (AST): 15 U/L (ref 10–35)
BKR AST/ALT RATIO: 0.9
BKR BILIRUBIN TOTAL: 0.6 mg/dL (ref <=1.2)
BKR BLOOD UREA NITROGEN: 39 mg/dL — ABNORMAL HIGH (ref 6–20)
BKR BUN / CREAT RATIO: 10.9 % — ABNORMAL LOW (ref 8.0–23.0)
BKR CALCIUM: 9.4 mg/dL — ABNORMAL HIGH (ref 8.8–10.2)
BKR CHLORIDE: 102 mmol/L (ref 98–107)
BKR CO2: 23 mmol/L (ref 20–30)
BKR CREATININE: 3.59 mg/dL — ABNORMAL HIGH (ref 0.40–1.30)
BKR EGFR (AFR AMER): 21 mL/min/{1.73_m2} (ref >60)
BKR GLOBULIN: 3 g/dL (ref 2.3–3.5)
BKR GLUCOSE: 95 mg/dL (ref 70–100)
BKR POTASSIUM: 5.4 mmol/L — ABNORMAL HIGH (ref 3.3–5.1)
BKR PROTEIN TOTAL: 6.9 g/dL (ref 6.6–8.7)
BKR SODIUM: 139 mmol/L (ref 136–144)
BKR WAM BASOPHILS: 15 U/L (ref 10–35)

## 2019-11-18 LAB — BASIC METABOLIC PANEL
BKR ANION GAP: 13 ng/mL (ref 7–17)
BKR ANION GAP: 13 (ref 7–17)
BKR BLOOD UREA NITROGEN: 64 mg/dL — ABNORMAL HIGH (ref 6–20)
BKR BLOOD UREA NITROGEN: 41 mg/dL — ABNORMAL HIGH (ref 6–20)
BKR BUN / CREAT RATIO: 12.8 (ref 8.0–23.0)
BKR BUN / CREAT RATIO: 9.6 (ref 8.0–23.0)
BKR CALCIUM: 9.6 mg/dL (ref 8.8–10.2)
BKR CALCIUM: 9.7 mg/dL (ref 8.8–10.2)
BKR CHLORIDE: 105 mmol/L (ref 98–107)
BKR CHLORIDE: 101 mmol/L (ref 98–107)
BKR CO2: 17 mmol/L — ABNORMAL LOW (ref 20–30)
BKR CO2: 23 mmol/L (ref 20–30)
BKR CREATININE: 5 mg/dL — ABNORMAL HIGH (ref 0.40–1.30)
BKR CREATININE: 4.28 mg/dL — ABNORMAL HIGH (ref 0.40–1.30)
BKR EGFR (AFR AMER): 15 mL/min/{1.73_m2} (ref >60)
BKR EGFR (AFR AMER): 17 mL/min/{1.73_m2} (ref >60)
BKR EGFR (NON AFRICAN AMERICAN): 12 mL/min/{1.73_m2} (ref >60)
BKR EGFR (NON AFRICAN AMERICAN): 14 mL/min/{1.73_m2} (ref >60)
BKR GLUCOSE: 110 mg/dL — ABNORMAL HIGH (ref 70–100)
BKR GLUCOSE: 117 mg/dL — ABNORMAL HIGH (ref 70–100)
BKR POTASSIUM: 7.5 mmol/L — CR (ref 3.3–5.1)
BKR POTASSIUM: 6.7 mmol/L — CR (ref 3.3–5.1)
BKR SODIUM: 135 mmol/L — ABNORMAL LOW (ref 136–144)
BKR SODIUM: 137 mmol/L (ref 136–144)

## 2019-11-18 LAB — TACROLIMUS LEVEL     (BH GH L LMW YH)
BKR TACROLIMUS BLOOD: 10.7 ng/mL
BKR TACROLIMUS BLOOD: 7 ng/mL

## 2019-11-18 LAB — PHOSPHORUS     (BH GH L LMW YH): BKR PHOSPHORUS: 3.9 mg/dL (ref 2.2–4.5)

## 2019-11-18 LAB — SARS COV-2 (COVID-19) RNA-~~LOC~~ LABS (BH GH LMW YH): BKR SARS-COV-2 RNA (COVID-19) (YH): NEGATIVE % — ABNORMAL LOW (ref 4.0–15.0)

## 2019-11-18 LAB — MAGNESIUM: BKR MAGNESIUM: 1.7 mg/dL (ref 1.7–2.4)

## 2019-11-18 LAB — UA REFLEX CULTURE

## 2019-11-18 LAB — CK     (BH GH L LMW YH): BKR CREATINE KINASE TOTAL: 91 U/L (ref 11–204)

## 2019-11-18 MED ORDER — DEXTROSE 50 % IN WATER (D50W) INTRAVENOUS SOLUTION
INTRAVENOUS | Status: AC | PRN
Start: 2019-11-18 — End: ?

## 2019-11-18 MED ORDER — SODIUM ZIRCONIUM CYCLOSILICATE 10 GRAM ORAL POWDER PACKET
10 gram | Freq: Three times a day (TID) | ORAL | Status: DC
Start: 2019-11-18 — End: 2019-11-19
  Administered 2019-11-19 (×2): 10 gram via ORAL

## 2019-11-18 MED ORDER — ATOVAQUONE 750 MG/5 ML ORAL SUSPENSION
7505 mg/5 mL | ORAL | Status: DC
Start: 2019-11-18 — End: 2019-11-20
  Administered 2019-11-18 – 2019-11-19 (×2): 750 mL via ORAL

## 2019-11-18 MED ORDER — DEXTROSE 50 % IN WATER (D50W) INTRAVENOUS SOLUTION
Freq: Once | INTRAVENOUS | Status: CP
Start: 2019-11-18 — End: ?
  Administered 2019-11-18: 21:00:00 50.000 mL via INTRAVENOUS

## 2019-11-18 MED ORDER — ZZ IMS TEMPLATE
Freq: Two times a day (BID) | ORAL | Status: DC
Start: 2019-11-18 — End: 2019-11-20
  Administered 2019-11-18 – 2019-11-20 (×4): 1 mg via ORAL

## 2019-11-18 MED ORDER — FUROSEMIDE 10 MG/ML INJECTION SOLUTION
10 mg/mL | Freq: Two times a day (BID) | INTRAVENOUS | Status: DC
Start: 2019-11-18 — End: 2019-11-19
  Administered 2019-11-18: 21:00:00 10 mL via INTRAVENOUS

## 2019-11-18 MED ORDER — FUROSEMIDE 10 MG/ML INJECTION SOLUTION
10 mg/mL | Freq: Once | INTRAVENOUS | Status: CP
Start: 2019-11-18 — End: ?
  Administered 2019-11-18: 22:00:00 10 mL via INTRAVENOUS

## 2019-11-18 MED ORDER — MYCOPHENOLATE MOFETIL 250 MG CAPSULE
250 mg | Freq: Two times a day (BID) | ORAL | Status: DC
Start: 2019-11-18 — End: 2019-11-20
  Administered 2019-11-18 – 2019-11-20 (×6): 250 mg via ORAL

## 2019-11-18 MED ORDER — ZZ IMS TEMPLATE
Freq: Two times a day (BID) | ORAL | Status: DC
Start: 2019-11-18 — End: 2019-11-18

## 2019-11-18 MED ORDER — CEPHALEXIN 500 MG CAPSULE
500 mg | Freq: Three times a day (TID) | ORAL | Status: DC
Start: 2019-11-18 — End: 2019-11-18

## 2019-11-18 MED ORDER — SODIUM ZIRCONIUM CYCLOSILICATE 10 GRAM ORAL POWDER PACKET
10 gram | Freq: Two times a day (BID) | ORAL | Status: DC
Start: 2019-11-18 — End: 2019-11-18
  Administered 2019-11-18: 18:00:00 10 gram via ORAL

## 2019-11-18 MED ORDER — INSULIN U-100 REGULAR HUMAN 100 UNIT/ML INJECTION SOLUTION
100 unit/mL | Freq: Once | INTRAVENOUS | Status: CP
Start: 2019-11-18 — End: ?
  Administered 2019-11-18: 21:00:00 100 mL via INTRAVENOUS

## 2019-11-18 MED ORDER — INSULIN LISPRO 100 UNIT/ML (SLIDING SCALE)
100 unit/mL | Freq: Three times a day (TID) | SUBCUTANEOUS | Status: DC
Start: 2019-11-18 — End: 2019-11-19

## 2019-11-18 MED ORDER — INSULIN LISPRO 100 UNIT/ML (SLIDING SCALE)
100 unit/mL | Freq: Every evening | SUBCUTANEOUS | Status: DC
Start: 2019-11-18 — End: 2019-11-19

## 2019-11-18 MED ORDER — SODIUM CHLORIDE 0.9 % BOLUS (NEW BAG)
0.9 % | Freq: Once | INTRAVENOUS | Status: CP
Start: 2019-11-18 — End: ?
  Administered 2019-11-18: 22:00:00 0.9 mL/h via INTRAVENOUS

## 2019-11-18 MED ORDER — HYALURONIDASE, HUMAN RECOMBINANT 150 UNIT/ML INJECTION SOLUTION
150 unit/mL | Freq: Once | SUBCUTANEOUS | Status: DC
Start: 2019-11-18 — End: 2019-11-20

## 2019-11-18 MED ORDER — LANSOPRAZOLE 3 MG/ML ORAL SUSPENSION
3 mg/mL | Freq: Every day | ORAL | Status: DC
Start: 2019-11-18 — End: 2019-11-20
  Administered 2019-11-19 – 2019-11-20 (×2): 3 mL via ORAL

## 2019-11-18 MED ORDER — CALCIUM GLUCONATE 1GM MINI-BAG PLUS
Freq: Once | INTRAVENOUS | Status: CP
Start: 2019-11-18 — End: ?
  Administered 2019-11-18: 21:00:00 100.000 mL/h via INTRAVENOUS

## 2019-11-18 NOTE — Plan of Care
Plan of Care Overview/ Patient Status    A&Ox4, pleasant. Ambulating independently in room and in hallway. Denies pain. Afebrile. NSR on monitor. BP high, given scheduled PO antihypertensives with good effect. Lungs clear on room air. Advanced to renal diet, tolerating well, swallows pills whole with water. Voiding spontaneously in urinal. No episodes of diarrhea since admission. Skin intact. In for transfer to [address], awaiting bed assignment.\[id number]: repeat potassium 6.7, treated with insulin, dextrose, calcium, lasix, and IVF fluid bolus. Initial concern for d50 extravasation, IV had slow blood return but was hurting patient and had mild swelling above puncture site. Cold compress applied to site, pain relieved and site back to normal.

## 2019-11-18 NOTE — H&P
[facility]	SICU History & PhysicalSubjective: Chief Complaint:  Hyperkalemia s/p DDKTHPI: 59 y.o. male admitted to the SICU on 09/04 for Emergent Dialysis.  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  Presented to ED with generalized malaise and chills, with minimal UOP, diarrhea, and nausea.  Renal u/s with moderately distended urinary bladder, satisfactory transplant kidney vascular hemodynamics.  CXR without consolidation or cardiovascular abnormality.  Labs sig K 8.0, Scr 5.6, BUN 67. In ED he was given Ca2+ gluc 1g, lokelma 10g, insulin reg 5U, Lasix 40mg PMH: FSGS, ESRD on HD, s/p DDRT, OSA, GERD, AV Fistula, Cirrhosis.PSH: DDKT, R. AV-fistula, repair of brachiocephalic aneurysmHome meds: norvasc, coreg 12.5, lipitor 40, Prograf 8/8, cellcept 1000mg  BID.  Prednisone 10mg  QD.  Fluconazole qd, Valganciclovir, Bactrim SS qdSICU course:DATE 09/04: Arrives to SICU, EKG without acute abnormality, K 7.5.  HR 69, BP 147/71, in good spirits, NAD.  9/5: s/p dialysis. Repeat labs pending. Medical History: PMH PSH Past Medical History: Diagnosis Date ? A-V fistula (HC Code)   right arm ? Anemia  ? 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 Overview:  Added automatically from request for surgery [id number] Overview:  Added automatically from request for surgery [id number] ? AV fistula infection (HC Code)  ? Cirrhosis (HC Code)  ? 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 GERD (gastroesophageal reflux disease)  ? Hypercholesteremia  ? Hyperparathyroidism (HC Code)  ? Hypertension  ? Infected prosthetic vascular graft, initial encounter (HC Code) 10/24/2014 ? Influenza A  ? Morbid obesity (HC Code) 02/03/2018 ? Obstructive sleep apnea pt uses CPAP ? Secondary hyperparathyroidism of renal origin (HC Code)  ? Thoracic aortic aneurysm (HC Code)   Past Surgical History: Procedure Laterality Date ? AV FISTULA PLACEMENT Left   left lower arm unsuccessful, then moved to upper arm 2011, then had problems in 10/2013 - infection  Social History Family History Social History Tobacco Use ? Smoking status: Former Smoker   Packs/day: 0.25   Years: 25.00   Pack years: 6.25   Types: Cigarettes ? Smokeless tobacco: Never Used Substance Use Topics ? Alcohol use: No  No family history on file. Prior to Admission Medications Medications Prior to Admission Medication Sig Dispense Refill Last Dose ? acetaminophen (TYLENOL) 325 mg tablet Take 2 tablets (650 mg total) by mouth every 6 (six) hours as needed for up to 10 days. 30 tablet 11  ? amLODIPine (NORVASC) 10 mg tablet Take 1 tablet (10 mg total) by mouth every morning. 90 tablet 3  ? atorvastatin (LIPITOR) 40 mg tablet Take 1 tablet (40 mg total) by mouth daily. 30 tablet 5  ? carvediloL (COREG) 25 mg Immediate Release tablet Take 1 tablet (25 mg total) by mouth 2 (two) times daily with breakfast and dinner. 60 tablet 11  ? ciprofloxacin HCl (CIPRO) 500 mg tablet Take 1 tablet (500 mg total) by mouth 2 (two) times daily. DO NOT TAKE UNTIL PRESCRIBER NOTIFIES YOU (Patient not taking: Reported on 11/12/2019) 4 tablet 0  ? fluconazole (DIFLUCAN) 100 mg tablet Take 1 tablet (100 mg total) by mouth daily. 30 tablet 0  ? mycophenolate mofetil (CELLCEPT) 250 mg capsule Take 4 capsules (1,000 mg total) by mouth 2 (two) times daily. (Patient taking differently: Take 750 mg by mouth 2 (two) times daily. ) 240 capsule 11  ? omeprazole (  PRILOSEC) 20 mg capsule Take 1 capsule (20 mg total) by mouth daily. 90 capsule 1  ? predniSONE (DELTASONE) 5 mg tablet Take 2 tablets (10 mg total) by mouth daily. Take with food. 60 tablet 11  ? senna-docusate (SENNA-PLUS) 8.6-50 mg tablet Take 1 tablet by mouth nightly. (Patient not taking: Reported on 11/12/2019)    ? sulfamethoxazole-trimethoprim (BACTRIM,SEPTRA) 400-80 mg per tablet Take 1 tablet by mouth daily. 30 tablet 5  ? tacrolimus (PROGRAF) 1 mg immediate release capsule Take 9 capsules (9 mg total) by mouth 2 (two) times daily. 540 capsule 11  ? valGANciclovir (VALCYTE) 450 mg tablet Take 1 tablet (450 mg total) by mouth Every Monday and Thursday. 60 tablet 2   Allergies No Known Allergies Review of Systems: + Nausea, diarrhea, chills, malaise, weakness.- Palpitations, SOB, headache, parasthesiaObjective: Vitals:Temp:  [36.3 ?C-37.2 ?C] 36.7 ?CPulse:  [60-93] 88Resp:  [10-26] 21BP: (115-167)/(67-98) 130/85SpO2:  [91 %-100 %] 94 %, and O2 Sat and Device used: SpO2: 94 %Device (Oxygen Therapy): room airI/O's:I have reviewed the patient's current I&O's as documented in the EMR. and Gross Totals (Last 24 hours) at 11/18/2019 0951Last data filed at 11/18/2019 0800Intake 1773.33 ml Output 2210 ml Net -436.67 ml Physical Exam: GEN: NAD, awake, in chairHEENT: Anicteric scleraNECK: no JVDPULM: CTABCV: RRR, no murmurs appreciableABD: soft, NTNDEXT/MSK: DP 2+ pulses, R. AV Fistula with palpable thrillNEURO/PSYCH: MAE, conversationalLabs: Recent Labs Lab 09/02/210857 09/02/210857 09/04/211737 09/05/210741 WBC 4.7  --  8.6 6.5 HGB 10.6*  --  10.5* 10.3* HCT 33.1*   < > 32.7* 30.3* PLT 272  --  271 257  < > = values in this interval not displayed. Recent Labs Lab 09/04/211737 09/04/212212 09/05/210741 NA 135* 135* 139 K 8.1* 7.5* 5.4* CL 105 105 102 CO2 17* 17* 23 BUN 66* 64* 39* CREATININE 4.98* 5.00* 3.59* ANIONGAP 13 13 14  CALCIUM 9.4 9.6 9.4 MG 1.8  --  1.7 PHOS  --   --  3.9 Microbiology:- 9/4 BCX; NGTDImaging: - 9/4 Renal US: Satisfactory appearance of the renal transplant and vascular hemodynamics. - 9/4 CXR No focal consolidation. ECG/Tele Events: - 9/4 EKG NSR, QTC 409, no peaked T waves Assessment/Plan  59 y.o. male w/hx DDRT 11/05/19 admitted to the SICU on 09/04 for Emergent Dialysis iso hyperkalemia. Differential includes diet non-compliance, recent bactrim, and low tacro level. Labs sig K 8.0, Scr 5.6, BUN 67. S/p dialysis. Neurologic: Analgesia- Not expressing any pain currently- APAP prnCardiac: Hx of HTN- Home norvasc 10 in AM- Home lipitor 40mg  in AM- home coreg 25mg  BID in AM- Maintain continuous telemetryRespiratory:  Former smoker- Visual merchandiser RA- Maintain O2 sats >92%, wean as tolerated- Encourage IS, deep breathing, coughingOSA- CPAP overnight while inpatient GI/Nutrition: - Renal diet - Pepcid Renal/Fluids/Lytes: FSGS s/p DDKT 08/2021Hyperkalemia- Dialysis complete - Nephrology following- s/p Lasix 40mg  IV- DC NS @ 100cc/h- BMP, mg, ph q4h- Foley catheter for strict I&O- Mg/K sliding scales- Prednisone 10mg  qd- Cellcept 1000mg  BID- Hold Prograf 9mg  BID- f/u Tacrolimus trough from 2200 - Tacro level prior to am dose - await nephrology recs Infectious Disease: DDKT infx prophylaxis- Norovirus, adenovirus w/i- CMV w/u- Stool pathogen w/u- f/u BCx- u/a without c/f infx- Diflucan 100mg  qd- Valgancyclovir 450mg  M, T- Hold Bactrim ppx with c/f contribution to hyperkalemiaHematology:- Daily CBC- Consider transfusion for H&H<7/21 or hemodynamic instabilityDVT prophylaxis- Maintain SCDs at all times- HSQ - will confirm dosing with pharmacy iso GFREndocrine: Hyperglycemia- Insulin sliding scaleMusculoskeletal: - OOB daily, - PT/OT evals if worry for patient deconditioning- Turn and reposition  Q2 hours, PUP dressingDevice Review:11/18/2019 9/4  PIV x2Code Status / Dispo:- Full codeNotifications: For questions please call: SICU Covering Provider listed in [facility] / EpicFinal plan per attending attestation.SignedNicholas Arabella Revelle, MDAnesthesiology, PGY2Reachable through [employer], M.D.Available via MHB9/07/2019 22:35Attending Addendum:I have examined the patient, reviewed the chart/available data, reviewed the Resident's/LIP's data and plan, and have personally formulated the plan outlined below. Assessment:59 year-old man with history of ESRD s/p renal transplant who presented with hyperkalemia admitted to ICU for emergent hemodialysis.PMH: FSGS, ESRD on HD, s/p DDRT, OSA, GERD, AV Fistula, Cirrhosis.?PSH: DDKT, R. AV-fistula, repair of brachiocephalic aneurysmCurrent Critical Issues:Hyperkalemia, and hypertension.Plan:Neurologic: CAM negative.Cardiac: HTN: - Continue amlodipine and carvedilol- Continue statinVascular: Good peripheral vascular examination.Respiratory: OSA:- Nocturnal CPAPEncourage IS and deep breathing.GI/nutrition: Low K diet.Renal/Fluids/electrolytes: Hyperkalemia:- S/p emergent dialysisESRD s/p renal transplant:- Monitor urine outputImmuno-suppression per transplant surgery and nephrology.Infectious Disease: Prophylaxis with valgancyclovir and fluconazole.Diarrheal workup per transplant nephrology.Hematology: Chronic anemia:- Maintain Hb >7Endocrine: ISS for hyperglycemia prevention.Musculoskeletal:  OOB to chair. PT/OT.Skin/wound:  No issues.Goals of Care/Advance Directives/Care Coordination: Full code.Prophylaxis: 		GI: PPI			VTE:  SC heparin/SCD's		Dispo: ICU level of care.Plan discussed with: SICU Nursing/Primary Team/Respiratory Therapy/APPsCritical care time:33 minutes (exclusive of separately documented procedures)Nikhil Chawla9/07/2019 10:08 AM

## 2019-11-18 NOTE — Progress Notes
[facility]   Transplant Surgery Progress NoteAttending Provider: Dawayne Patricia, MDAdmit Date: 9/4/2021Hospital Day: 2  s/p Code status: Full Code/ACLSAllergy: Patient has no known allergies.Subjective: Patient underwent dialysis overnight. K 5.4 this AM. Patient states that he has had no diarrhea since last night.Objective: Temp:  [97.3 ?F (36.3 ?C)-98.9 ?F (37.2 ?C)] 97.5 ?F (36.4 ?C)Pulse:  [60-93] 82Resp:  [10-26] 18BP: (109-167)/(67-98) 109/72SpO2:  [91 %-100 %] 98 %Device (Oxygen Therapy): room airI/O last 3 completed shifts:In: 1573.3 [I.V.:1173.3; Other:400]Out: 1960 [Urine:1560; Other:400]CBCLab Results Component Value Date  WBC 6.5 11/18/2019  HGB 10.3 (L) 11/18/2019  HCT 30.3 (L) 11/18/2019  PLT 257 11/18/2019 LYTESLab Results Component Value Date  NA 139 11/18/2019  K 5.4 (H) 11/18/2019  CL 102 11/18/2019  CO2 23 11/18/2019  BUN 39 (H) 11/18/2019  CREATININE 3.59 (H) 11/18/2019  GLU 107 (H) 11/18/2019  CALCIUM 9.4 11/18/2019  MG 1.7 11/18/2019  PHOS 3.9 11/18/2019 LFTsLab Results Component Value Date  BILITOT 0.6 11/18/2019  BILIDIR <0.2 11/17/2019  AST 15 11/18/2019  ALT 16 11/18/2019  ALKPHOS 65 11/18/2019  LIPASE 46 11/17/2019 PT/INR/PTTLab Results Component Value Date  INR 1.06 11/05/2019  PTT 26.0 11/05/2019 Microbiology:N/ADiagnostics:CXR (portable)Result Date: 9/4/2021XR CHEST PA OR AP INDICATION: chills, fatigue, recent kidney transplant, ?covid COMPARISON: Chest x-ray 11/05/2019 FINDINGS:   Vascular stent projecting over the left axilla/subclavian region. There is a horizontal linear hyperdensity overlying right lateral edge of the third thoracic vertebral body as previously seen on previous chest x-ray The cardiomediastinal silhouette is mildly enlarged. There are calcifications within the aortic arch The lungs are clear. The pleural spaces are clear. There is no acute osseous injury. No focal consolidation. Of note, radiograph is not sensitive for detection of subtle/early groundglass parenchymal changes. Report Initiated By:  Kathlen Brunswick, MD Reported And Signed By: Beverly Gust, MDUS Renal Transplant With DopplerResult Date: 9/4/2021US RENAL TRANSPLANT WITH DOPPLER INDICATION: Status post recent renal transplant on 11/05/2019, presenting with diarrhea/chills. COMPARISON: Renal transplant ultrasound 11/05/2019 FINDINGS: The transplant kidney is identified in the right lower abdominal quadrant. It measures 10.1 cm, demonstrates normal cortical thickness, parenchymal echogenicity and corticomedullary differentiation. There is no hydronephrosis. There is trace free fluid anteromedial to the transplant kidney. Sampled interlobar, segmental and main renal arteries show satisfactory flow on color and spectral Doppler. Sampled resistive indices are as follows: Upper pole: Segmental: 0.69 (previously 0.59) Interlobar: 0.57 (previously 0.67) Lower pole: Segmental: 0.64 (previously 0.68) Interlobar: 0.64 (previously 0.58) The acceleration times are within normal limits. The peak systolic velocities of the main renal artery are as follows: anastomosis 148-236 (previously 92) cm/sec, mid artery 84 (previously 77) cm/sec and at the hilum 63 (previously 70) cm/sec. The arterial waveform is normal. The main renal vein is patent. The peak systolic velocity of the right external iliac artery is 119 (preanastomosis, previously 77) and 138 (previously 176) cm/sec. The urinary bladder is moderately distended. There is no debris within the bladder. Bilateral ureteral stents are partially visualized terminating in the bladder. Satisfactory appearance of the renal transplant and vascular hemodynamics. Report Initiated By:  Gwendlyn Deutscher, MD Reported And Signed By: Levonne Spiller, MDPHYSICAL EXAMGeneral: Comfortable appearingPulm: Breathing comfortably on room air, CTABCV: Regular rate and rhythmAbdomen is soft, nontender, nondistended, surgical site is well approximated with staples, no erythemaExtremities warm and well perfusedAssessment Patient is a 59 y.o. 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 He is recovering as expected.Plan ?	Transfer to floor?	Renal diet?	Wound care: monitor?	GU: freely voiding. Continue to measure.?	No drains?	Restart tac 8/8, Cellcept 750mg  BID, prednisone 10mg  QD?	Renal Diet?	Bowel regimen?	DVT prophylaxis with heparin?	Appreciate Nephrology recommendations - Lasix 40mg  BID, Lokelma 10g BID?	Restart Bactrim (donor was Toxo positive) if ok with Infectious Disease?	Home meds: reviewed Notifications : For questions, please page: Transplant SurgeryElectronically Signed ZO:XWRUE F Braylen Staller, MD9/07/2019, 1:22 PMAttending Addendum:

## 2019-11-18 NOTE — H&P
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 He did have DGF and required HD on his second day post-op, but then progressed well and was discharged on POD#4 with a functioning graft. He continued to make urine outpatient and followed up with the surgical team on 8/30 and was doing well, and separately with the [facility] Nephrology team on 9/2. He presents this evening with 3 days of diarrhea (non-bloody) ~4 times per day.In the ED he is hemodynamically stable, but labs reveal a potassium of 8.1.  Following his labs his Cr was consisently downtrending from 11.47 -> 4.98 here in the ED. He continues to make light yellow urine with good volume. An US showed appropriate flow through the graft. Transplant nephrology was consulted to see the patient and are suggesting urgent HD through his existing AVF.Review of Systems + Diarrhea, malaise, chills.Negative review except as noted above.Past Medical History  Past Surgical History  Past Medical History: Diagnosis Date ? A-V fistula (HC Code)   right arm ? Anemia  ? 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 Overview:  Added automatically from request for surgery [id number] Overview:  Added automatically from request for surgery [id number] ? AV fistula infection (HC Code)  ? Cirrhosis (HC Code)  ? 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 GERD (gastroesophageal reflux disease)  ? Hypercholesteremia  ? Hyperparathyroidism (HC Code)  ? Hypertension  ? Infected prosthetic vascular graft, initial encounter (HC Code) 10/24/2014 ? Influenza A  ? Morbid obesity (HC Code) 02/03/2018 ? Obstructive sleep apnea   pt uses CPAP ? Secondary hyperparathyroidism of renal origin (HC Code)  ? Thoracic aortic aneurysm (HC Code)   Past Surgical History: Procedure Laterality Date ? AV FISTULA PLACEMENT Left   left lower arm unsuccessful, then moved to upper arm 2011, then had problems in 10/2013 - infection  Social History  Family History Social History Socioeconomic History ? Marital status: Married   Spouse name: Not on file ? Number of children: Not on file ? Years of education: Not on file ? Highest education level: Not on file Occupational History ? Not on file Tobacco Use ? Smoking status: Former Smoker   Packs/day: 0.25   Years: 25.00   Pack years: 6.25   Types: Cigarettes ? Smokeless tobacco: Never Used Vaping Use ? Vaping Use: Never used Substance and Sexual Activity ? Alcohol use: No ? Drug use: No ? Sexual activity: Yes   Partners: Female Other Topics Concern ? Not on file Social History Narrative ? Not on file Social Determinants of Health Financial Resource Strain:  ? Difficulty of Paying Living Expenses:  Food Insecurity:  ? Worried About Programme researcher, broadcasting/film/video in the Last Year:  ? Barista in the Last Year:  Transportation Needs:  ? Freight forwarder (Medical):  ? Lack of Transportation (Non-Medical):  Physical Activity:  ? Days of Exercise per Week:  ? Minutes of Exercise per Session:  Stress:  ? Feeling of Stress :  Social Connections:  ? Frequency of Communication with Friends and Family:  ? Frequency of Social Gatherings with Friends and Family:  ? Attends Religious Services:  ? Active Member of Clubs or  Organizations:  ? Attends Engineer, structural:  ? Marital Status:  Intimate Partner Violence:  ? Fear of Current or Ex-Partner:  ? Emotionally Abused:  ? Physically Abused:  ? Sexually Abused:   No family history on file. Review of Medications: I have reviewed the patient's: I have reviewed the patient's current meds as listed in the patient's EMR.Objective: Temp:  [98.5 ?F (36.9 ?C)-98.9 ?F (37.2 ?C)] 98.5 ?F (36.9 ?C)Pulse:  [60-68] 60Resp:  [16-18] 18BP: (130-135)/(67-76) 130/67SpO2:  [98 %-100 %] 100 %Device (Oxygen Therapy): room airCBC:Lab Results Component Value Date  WBC 8.6 11/17/2019  HGB 10.5 (L) 11/17/2019  HCT 32.0 (L) 11/17/2019  PLT 271 11/17/2019 LYTES:Lab Results Component Value Date  NA 135 (L) 11/17/2019  K 8.0 (HH) 11/17/2019  CL 105 11/17/2019  CO2 17 (L) 11/17/2019  CREATININE 5.6 (HH) 11/17/2019  BUN 66 (H) 11/17/2019  GLU 128 (H) 11/17/2019  CALCIUM 9.4 11/17/2019  MG 1.8 11/17/2019  PHOS 4.5 11/15/2019 LFTs:Lab Results Component Value Date  BILITOT 0.3 11/17/2019  BILIDIR <0.2 11/17/2019  AST 17 11/17/2019  ALT 20 11/17/2019  ALKPHOS 63 11/17/2019  LIPASE 46 11/17/2019 PT/INR/PTT:Lab Results Component Value Date  INR 1.06 11/05/2019  PTT 26.0 11/05/2019 Diagnostics:CXR (portable)Result Date: 9/4/2021XR CHEST PA OR AP INDICATION: chills, fatigue, recent kidney transplant, ?covid COMPARISON: Chest x-ray 11/05/2019 FINDINGS:   Vascular stent projecting over the left axilla/subclavian region. There is a horizontal linear hyperdensity overlying right lateral edge of the third thoracic vertebral body as previously seen on previous chest x-ray The cardiomediastinal silhouette is mildly enlarged. There are calcifications within the aortic arch The lungs are clear. The pleural spaces are clear. There is no acute osseous injury. No focal consolidation. Of note, radiograph is not sensitive for detection of subtle/early groundglass parenchymal changes. Report Initiated By:  Kathlen Brunswick, MD Reported And Signed By: Beverly Gust, MDUS Renal Transplant With DopplerResult Date: 9/4/2021US RENAL TRANSPLANT WITH DOPPLER INDICATION: Status post recent renal transplant on 11/05/2019, presenting with diarrhea/chills. COMPARISON: Renal transplant ultrasound 11/05/2019 FINDINGS: The transplant kidney is identified in the right lower abdominal quadrant. It measures 10.1 cm, demonstrates normal cortical thickness, parenchymal echogenicity and corticomedullary differentiation. There is no hydronephrosis. There is trace free fluid anteromedial to the transplant kidney. Sampled interlobar, segmental and main renal arteries show satisfactory flow on color and spectral Doppler. Sampled resistive indices are as follows: Upper pole: Segmental: 0.69 (previously 0.59) Interlobar: 0.57 (previously 0.67) Lower pole: Segmental: 0.64 (previously 0.68) Interlobar: 0.64 (previously 0.58) The acceleration times are within normal limits. The peak systolic velocities of the main renal artery are as follows: anastomosis 148-236 (previously 92) cm/sec, mid artery 84 (previously 77) cm/sec and at the hilum 63 (previously 70) cm/sec. The arterial waveform is normal. The main renal vein is patent. The peak systolic velocity of the right external iliac artery is 119 (preanastomosis, previously 77) and 138 (previously 176) cm/sec. The urinary bladder is moderately distended. There is no debris within the bladder. Bilateral ureteral stents are partially visualized terminating in the bladder. Satisfactory appearance of the renal transplant and vascular hemodynamics. Report Initiated By:  Gwendlyn Deutscher, MD Reported And Signed By: Levonne Spiller, MDPHYSICAL EXAMGen: alert, well developed, well nourished, no distress, cooperative, Oriented to person, place, time and eventNeuro: is nonfocalHeart: Regular rate Pulm: Normal work of breathing on RAAbd: soft, obese, nontender, mildly distended. Right lower incision closed with staples, well appearing. No erythema. Non-tender.Ext: warm, with no clubbing, cyanosis, or edema. RUQ with AVF, palpable thrill.Assessment Mr Messer is  a 59 y.o. male with ESRD 2/2 to FSGS now s/p DDKT (1A/1V/1U stented, Campath induction) on 11/05/2019 by Dr Pascal Lux. Pos-op c/b DGF requiring HD on post-transplant day two. Had been doing well but returned this evening with significant electrolyte abnormalities, and transplant nephrology is planning urgent HD.Plan ?	Will admit to Transplant Surgery, under Dr Verline Lema, in the SICU.?	Reaching out to SICU attending for bed request.?	Will require urgent HD, Transplant Nephrology coordinating?	Resume immunosuppressionDiscussed with Chief Resident, Dr Saddie Benders, who spoke with Dr Verline Lema. Notifications : For questions, please page: Transplant SurgerySigned:Nicholas Timmothy Euler, MD9/06/2019, 9:53 PMAttending Addendum:  Patient seen and examined and fully discussed with the Dr. Saddie Benders and team on service.  I have reviewed the case in multidisciplinary rounds and we have discussed the plan of care in detail.  I agree with the documentation as presented in this note.  Actively monitoring and titrating immunosuppression. Will hydrate and treat hyperkalemia and adjust prograf to lower dose as well as counsel about high potassium foods. Admit to monitored bed overnight. Dineen Kid Verline Lema, MDDivision of Transplantation and ImmunologyYale Selby General Hospital

## 2019-11-18 NOTE — ED Notes
9:25 PM MICU at bedside

## 2019-11-18 NOTE — Transfer Summaries
[facility] [place]	SICU Transfer NoteSubjective: Transfer Summary: Chief Complaint:  Hyperkalemia s/p DDKTHPI: 59 y.o. male admitted to the SICU on 09/04 for Emergent Dialysis.  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  Presented to ED with generalized malaise and chills, with minimal UOP, diarrhea, and nausea.  Renal u/s with moderately distended urinary bladder, satisfactory transplant kidney vascular hemodynamics.  CXR without consolidation or cardiovascular abnormality.  Labs sig K 8.0, Scr 5.6, BUN 67. In ED he was given Ca2+ gluc 1g, lokelma 10g, insulin reg 5U, Lasix 40mg PMH: FSGS, ESRD on HD, s/p DDRT, OSA, GERD, AV Fistula, Cirrhosis.PSH: DDKT, R. AV-fistula, repair of brachiocephalic aneurysmHome meds: norvasc, coreg 12.5, lipitor 40, Prograf 8/8, cellcept 1000mg  BID.  Prednisone 10mg  QD.  Fluconazole qd, Valganciclovir, Bactrim SS qdSICU course:9/4: Arrives to SICU, EKG without acute abnormality, K 7.5.  HR 69, BP 147/71, in good spirits, NAD.  9/5: s/p dialysis. Repeat labs notable for K 5.4. OOB, walking around unit. Started on lasix BID, lokelma BID, low K diet. Atovaqone in place of Bactrim. PM BMP with K 6.7. EKG unremarkable. Given insulin/dextrose, Ca. Transfer Team Action Items: [ ]  f/u ID workup for diarrhea ordered by nephrology [ ]  Reassess need for finger sticks/insulin (on prednisone) Objective: Vitals:Temp:  [36.3 ?C-37.2 ?C] 36.7 ?CPulse:  [60-93] 81Resp:  [10-26] 20BP: (115-167)/(67-98) 152/82SpO2:  [91 %-100 %] 95 %, and O2 Sat and Device used: SpO2: 95 %Device (Oxygen Therapy): room airI/O's:I have reviewed the patient's current I&O's as documented in the EMR. and Gross Totals (Last 24 hours) at 11/18/2019 1108Last data filed at 11/18/2019 1000Intake 1773.33 ml Output 2510 ml Net -736.67 ml Physical Exam: GEN: NAD, awake, in chairHEENT: Anicteric scleraNECK: no JVDPULM: CTABCV: RRR, no murmurs appreciableABD: soft, NTNDEXT/MSK: DP 2+ pulses, R. AV Fistula with palpable thrillNEURO/PSYCH: MAE, conversationalLabs: Recent Labs Lab 09/02/210857 09/02/210857 09/04/211737 09/05/210741 WBC 4.7  --  8.6 6.5 HGB 10.6*  --  10.5* 10.3* HCT 33.1*   < > 32.7* 30.3* PLT 272  --  271 257  < > = values in this interval not displayed. Recent Labs Lab 09/04/211737 09/04/212212 09/05/210741 NA 135* 135* 139 K 8.1* 7.5* 5.4* CL 105 105 102 CO2 17* 17* 23 BUN 66* 64* 39* CREATININE 4.98* 5.00* 3.59* ANIONGAP 13 13 14  CALCIUM 9.4 9.6 9.4 MG 1.8  --  1.7 PHOS  --   --  3.9 Microbiology:- 9/4 BCX; NGTD- Nephrology ID workup pending collection Imaging: - 9/4 Renal US: Satisfactory appearance of the renal transplant and vascular hemodynamics. - 9/4 CXR No focal consolidation. ECG/Tele Events: - 9/4 EKG NSR, QTC 409, no peaked T waves Assessment/Plan  59 y.o. male w/hx DDRT 11/05/19 admitted to the SICU on 09/04 for Emergent Dialysis iso hyperkalemia. Differential includes diet non-compliance, recent bactrim, and low tacro level. Labs sig K 8.0, Scr 5.6, BUN 67. S/p dialysis. Neurologic: Analgesia- Not expressing any pain currently- APAP prnCardiac: Hx of HTN- Home norvasc 10 in AM- Home lipitor 40mg  in AM- home coreg 25mg  BID in AM- Maintain continuous telemetryRespiratory:  Former smoker- Visual merchandiser RA- Maintain O2 sats >92%, wean as tolerated- Encourage IS, deep breathing, coughingOSA- CPAP overnight while inpatient GI/Nutrition: - Renal diet - Pepcid Renal/Fluids/Lytes: FSGS s/p DDKT 08/2021Hyperkalemia- Dialysis complete - Nephrology following- Lasix 40mg  IV BID- BMP q8- Lokelma BID- Foley catheter for strict I&O- Mg/K sliding scales- Prednisone 10mg  qd- Cellcept 1000mg  BID- Restart Prograf as 8mg  BIDInfectious  Disease: DDKT infx prophylaxis- Norovirus, adenovirus w/i- CMV w/u- Stool pathogen w/u- f/u BCx- u/a without c/f infx- Diflucan 100mg  qd- Valgancyclovir 450mg  M, T- Concern that Bactrim contributed to hyperkalemia - Can use Atovaquone 1500mg  daily instead, per conversation with ID (Dr. Glennie Isle)  Hematology:- Daily CBC- Consider transfusion for H&H<7/21 or hemodynamic instabilityDVT prophylaxis- Maintain SCDs at all times- HSQ - no adjustment to dosing per pharmacy Endocrine: Hyperglycemia- Insulin sliding scaleMusculoskeletal: - OOB daily, - PT/OT evals if worry for patient deconditioning- Turn and reposition Q2 hours, PUP dressingDevice Review:11/18/2019 9/4  PIV x2SignedNicholas Robbi Scurlock, MDAnesthesiology, PGY2Reachable through [facility]

## 2019-11-18 NOTE — Progress Notes
[facility]	 [facility]	Transplant Nephrology Progress NoteS:S/p HD today. Overall doing well without complaints.Wonders when he is going to go home. MEDICATIONS:Scheduled Meds:Current Facility-Administered Medications Medication Dose Route Frequency Provider Last Rate Last Admin ? amLODIPine (NORVASC) tablet 10 mg  10 mg Oral Daily Toney Rakes, MD   10 mg at 11/18/19 0813 ? carvediloL (COREG) Immediate Release tablet 25 mg  25 mg Oral BID WC Toney Rakes, MD   25 mg at 11/18/19 0813 ? fluconazole (DIFLUCAN) tablet 100 mg  100 mg Oral Daily Toney Rakes, MD   100 mg at 11/18/19 0813 ? heparin (porcine) injection 5,000 Units  5,000 Units Subcutaneous Q8H Toney Rakes, MD   5,000 Units at 11/18/19 0550 ? insulin regular human (HumuLIN R, NovoLIN R) Sliding Scale (See admin instructions for dose)   Subcutaneous Q6H Toney Rakes, MD     ? Melene Muller ON 11/19/2019] lansoprazole (PREVACID) oral suspension (3 mg/mL) 30 mg  30 mg Oral Daily Cormier, Maurine Simmering, MD     ? mycophenolate mofetil (CELLCEPT) capsule 750 mg  750 mg Oral Q12H Toney Rakes, MD   750 mg at 11/18/19 0813 ? predniSONE (DELTASONE) tablet 10 mg  10 mg Oral Daily Toney Rakes, MD   10 mg at 11/18/19 0813 ? rosuvastatin (CRESTOR) tablet 20 mg  20 mg Oral Daily Toney Rakes, MD   20 mg at 11/18/19 1610 ? sodium chloride 0.9 % flush 3 mL  3 mL IV Push Q8H Toney Rakes, MD     ? Melene Muller ON 11/19/2019] valGANciclovir (VALCYTE) tablet 450 mg  450 mg Oral Q Mon & Thurs (0900) Persaud, Lynnda Shields, MD     Continuous Infusions:PRN Meds:.acetaminophen, dextrose 50% in water (D50W), dextrose 50% in water (D50W), Enteral Hypoglycemia Management if Blood Glucose 50 - 69 mg/dL or 70 - 79 mg/dL with symptoms of Hypoglycemia, Enteral Hypoglycemia Management if Blood Glucose less than 50 mg/dL, glucagon, sodium chloridePE:Vitals:  11/18/19 1108 BP: 112/69 Pulse: 80 Resp: 18 Temp:  Gen:  Alert and oriented in no acute distressHEENT:  Moist mucous membranes no conjunctival icterusCV:  S1-S2 no murmur rubs or gallopsResp:  Clear to auscultation bilaterallyAbd:  Soft nontender nondistended incisions site healing well without drainageExt:  No lower extremity edema right arm fistula with thrill and bruit.LABS:LabsRecent Labs Lab 09/02/210857 09/02/210857 09/04/211737 09/05/210741 WBC 4.7  --  8.6 6.5 HGB 10.6*  --  10.5* 10.3* HCT 33.1*   < > 32.7* 30.3* PLT 272  --  271 257  < > = values in this interval not displayed.  Recent Labs Lab 09/04/211737 09/04/211744 09/04/212212 09/05/210741 09/05/211108 NA 135*  --  135* 139  --  K 8.1*  --  7.5* 5.4*  --  CL 105  --  105 102  --  CO2 17*  --  17* 23  --  BUN 66*  --  64* 39*  --  CREATININE 4.98*  --  5.00* 3.59*  --  GLU 128*   < > 110* 95 107* ANIONGAP 13  --  13 14  --   < > = values in this interval not displayed. Recent Labs Lab 09/04/211737 09/04/212212 09/05/210741 CALCIUM 9.4 9.6 9.4 MG 1.8  --  1.7 PHOS  --   --  3.9  No results for input(s): PTT, LABPROT, INR in the last 168 hours. Recent Labs Lab 09/04/211737 09/05/210741 ALT 20 16 AST 17 15 ALKPHOS 63 65 BILITOT 0.3 0.6 BILIDIR <0.2  --  MICROBIOLOGY:ReviewedRADIOLOGY:CXR (portable)Result Date: 9/4/2021No focal consolidation. Of note, radiograph is not sensitive for detection of subtle/early groundglass parenchymal changes. Report Initiated By:  Kathlen Brunswick, MD Reported And Signed By: Beverly Gust, MDUS Renal Transplant With DopplerResult Date: 9/4/2021Satisfactory appearance of the renal transplant and vascular hemodynamics. Report Initiated By:  Gwendlyn Deutscher, MD Reported And Signed By: Levonne Spiller, MDAssessment:Adam Harper is a 59 year old male with a history of hypertension, GERD, hyperlipidemia, OSA  ESRD?secondary to?FSGS, who?successfully underwent?a?deceased donor kidney transplant (L kidney to R iliac fossa- 1A/1V/1U -stented)??with Campath induction on 11/05/2019 he required hemodialysis on the 2nd day after transplant for hyperkalemia he is presenting again with hyperkalemia with serum potassium at 8.1.He is s/p dialysis. His K is 5.4. I suspect that this could be managed medically. Tac level at goal after evening trough was measured. Graft function continues to work. Renal Plan:- Low potassium diet. - Would continue to give lasix. - Please check BMP Q8H. - Would also give lokelma 10 g BID; hold when K< 5. - Continue giving lasix 40 BID. Immune suppression - Would continue mycophenolate 1000 BID and prednisone 10 mg. - would resume Tacrolimus 8 mg BID. - Continue valcyte.- Will need to discuss with ID alternative for bactrim for toxo and PJP prophylaxis. Case was discussed with Dr. Alyssa Grove please refer to his addendum for his final recommendations. Adam Dejager Gomez9/07/2019 11:28 AMAfter hours and on Weekends please contact the Renal Fellow on call (see AMION)

## 2019-11-18 NOTE — ED Notes
7:17 PM Estelle June, RNAssumed pt care, received report from [place], RNPast Medical History: Diagnosis Date ? A-V fistula (HC Code)   right arm ? Anemia  ? 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 Overview:  Added automatically from request for surgery [id number] Overview:  Added automatically from request for surgery [id number] ? AV fistula infection (HC Code)  ? Cirrhosis (HC Code)  ? 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 GERD (gastroesophageal reflux disease)  ? Hypercholesteremia  ? Hyperparathyroidism (HC Code)  ? Hypertension  ? Infected prosthetic vascular graft, initial encounter (HC Code) 10/24/2014 ? Influenza A  ? Morbid obesity (HC Code) 02/03/2018 ? Obstructive sleep apnea   pt uses CPAP ? Secondary hyperparathyroidism of renal origin (HC Code)  ? Thoracic aortic aneurysm (HC Code)  8:11 PMPt to U/S10:34 PMReport given to SICU, pt transferred in stable condition

## 2019-11-18 NOTE — Plan of Care
Admission Note Nursing Adam Harper is a 59 y.o. male admitted with a chief complaint of diarrhea X 4 days. Patient arrived from  ED.Patient is   alert and oriented X 4, follows commands, pupils equal and reactive. No temp. BP stable. NSR. K is 7.5, lasix given and HD done. Lungs sounds clear. Room air, CPAP at night. Abdomen is soft and nontender. See flowsheet for skin details. T&P independently.  Vitals:  11/18/19 0300 11/18/19 0310 11/18/19 0400 11/18/19 0412 BP: 115/73 (!) 158/74 (!) 163/96  Pulse: 75 84 79  Resp: (!) 13 19 (!) 21  Temp:  97.7 ?F (36.5 ?C)   TempSrc:  Temporal   SpO2: 98%  96%  Weight:    118.9 kg Height:    5' 9 (1.753 m) Oxygen therapy Oxygen TherapySpO2: 96 %Device (Oxygen Therapy): room air$Oxygen On/Off : Off (stops the charge episode)I have reviewed the patient's current medication orders..Comments: belongings are cellphone and clothes See flowsheets, patient education and plan of care for additional information. Plan of Care Overview/ Patient Status

## 2019-11-18 NOTE — Progress Notes
[facility]	SICU Progress NoteSubjective: Chief Complaint:  Hyperkalemia s/p DDKTHPI: 59 y.o. male admitted to the SICU on 09/04 for Emergent Dialysis.  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  Presented to ED with generalized malaise and chills, with minimal UOP, diarrhea, and nausea.  Renal u/s with moderately distended urinary bladder, satisfactory transplant kidney vascular hemodynamics.  CXR without consolidation or cardiovascular abnormality.  Labs sig K 8.0, Scr 5.6, BUN 67. In ED he was given Ca2+ gluc 1g, lokelma 10g, insulin reg 5U, Lasix 40mg PMH: FSGS, ESRD on HD, s/p DDRT, OSA, GERD, AV Fistula, Cirrhosis.PSH: DDKT, R. AV-fistula, repair of brachiocephalic aneurysmHome meds: norvasc, coreg 12.5, lipitor 40, Prograf 8/8, cellcept 1000mg  BID.  Prednisone 10mg  QD.  Fluconazole qd, Valganciclovir, Bactrim SS qdSICU course:9/4: Arrives to SICU, EKG without acute abnormality, K 7.5.  HR 69, BP 147/71, in good spirits, NAD.  9/5: s/p dialysis. Repeat labs notable for K 5.4. OOB, walking around unit. Started on lasix BID, lokelma BID, low K diet. Atovaqone in place of Bactrim. PM BMP with K 6.7. EKG unremarkable. Given insulin/dextrose, Ca. Transfer Team Action Items: [ ]  f/u ID workup for diarrhea ordered by nephrology [ ]  Reassess need for finger sticks/insulin (on prednisone) Objective: Vitals:Temp:  [36.3 ?C-37.2 ?C] 36.4 ?CPulse:  [60-93] 78Resp:  [10-26] 18BP: (109-167)/(65-98) 130/75SpO2:  [91 %-100 %] 98 %, and O2 Sat and Device used: SpO2: 98 %Device (Oxygen Therapy): room airI/O's:I have reviewed the patient's current I&O's as documented in the EMR. and Gross Totals (Last 24 hours) at 11/18/2019 1637Last data filed at 11/18/2019 1600Intake 2253.33 ml Output 3010 ml Net -756.67 ml Physical Exam: GEN: NAD, awake, in chairHEENT: Anicteric scleraNECK: no JVDPULM: CTABCV: RRR, no murmurs appreciableABD: soft, NTNDEXT/MSK: DP 2+ pulses, R. AV Fistula with palpable thrillNEURO/PSYCH: MAE, conversationalLabs: Recent Labs Lab 09/02/210857 09/02/210857 09/04/211737 09/05/210741 WBC 4.7  --  8.6 6.5 HGB 10.6*  --  10.5* 10.3* HCT 33.1*   < > 32.7* 30.3* PLT 272  --  271 257  < > = values in this interval not displayed. Recent Labs Lab 09/04/212212 09/05/210741 09/05/211430 NA 135* 139 137 K 7.5* 5.4* 6.7* CL 105 102 101 CO2 17* 23 23 BUN 64* 39* 41* CREATININE 5.00* 3.59* 4.28* ANIONGAP 13 14 13  CALCIUM 9.6 9.4 9.7 MG  --  1.7  --  PHOS  --  3.9  --  Microbiology:- 9/4 BCX; NGTD- Nephrology ID workup pending collection Imaging: - 9/4 Renal US: Satisfactory appearance of the renal transplant and vascular hemodynamics. - 9/4 CXR No focal consolidation. ECG/Tele Events: - 9/4 EKG NSR, QTC 409, no peaked T waves Assessment/Plan  59 y.o. male w/hx DDRT 11/05/19 admitted to the SICU on 09/04 for Emergent Dialysis iso hyperkalemia. Differential includes diet non-compliance, recent bactrim, and low tacro level. Labs sig K 8.0, Scr 5.6, BUN 67. S/p dialysis. Neurologic: Analgesia- Not expressing any pain currently- APAP prnCardiac: Hx of HTN- Home norvasc 10 in AM- Home lipitor 40mg  in AM- home coreg 25mg  BID in AM- Maintain continuous telemetryRespiratory:  Former smoker- Visual merchandiser RA- Maintain O2 sats >92%, wean as tolerated- Encourage IS, deep breathing, coughingOSA- CPAP overnight while inpatient GI/Nutrition: - Renal diet - Pepcid Renal/Fluids/Lytes: FSGS s/p DDKT 08/2021Hyperkalemia- Dialysis complete - Nephrology following- Lasix 40mg  IV BID- BMP q8- Lokelma BID- Foley catheter for strict I&O- Mg/K sliding scales- Prednisone 10mg  qd- Cellcept 1000mg  BID- Restart Prograf as 8mg  BIDInfectious  Disease: DDKT infx prophylaxis- Norovirus, adenovirus w/i- CMV w/u- Stool pathogen w/u- f/u BCx- u/a without c/f infx- Diflucan 100mg  qd- Valgancyclovir 450mg  M, T- Concern that Bactrim contributed to hyperkalemia - Can use Atovaquone 1500mg  daily instead, per conversation with ID (Dr. Glennie Isle)  Hematology:- Daily CBC- Consider transfusion for H&H<7/21 or hemodynamic instabilityDVT prophylaxis- Maintain SCDs at all times- HSQ - no adjustment to dosing per pharmacy Endocrine: Hyperglycemia- Insulin sliding scaleMusculoskeletal: - OOB daily, - PT/OT evals if worry for patient deconditioning- Turn and reposition Q2 hours, PUP dressingDevice Review:11/18/2019 9/4  PIV x2SignedNicholas Vasco Chong, MDAnesthesiology, PGY2Reachable through [facility]

## 2019-11-18 NOTE — Utilization Review (ED)
UM Status: Commercial - IP

## 2019-11-19 LAB — TACROLIMUS LEVEL     (BH GH L LMW YH): BKR TACROLIMUS BLOOD: 7.2 ng/mL

## 2019-11-19 LAB — BASIC METABOLIC PANEL
BKR ANION GAP: 17 (ref 7–17)
BKR ANION GAP: 15 (ref 7–17)
BKR BLOOD UREA NITROGEN: 46 mg/dL — ABNORMAL HIGH (ref 6–20)
BKR BLOOD UREA NITROGEN: 48 mg/dL — ABNORMAL HIGH (ref 6–20)
BKR BUN / CREAT RATIO: 10.6 (ref 8.0–23.0)
BKR BUN / CREAT RATIO: 10.6 (ref 8.0–23.0)
BKR CALCIUM: 9 mg/dL (ref 8.8–10.2)
BKR CALCIUM: 9.1 mg/dL (ref 8.8–10.2)
BKR CHLORIDE: 100 mmol/L (ref 98–107)
BKR CHLORIDE: 100 mmol/L (ref 98–107)
BKR CO2: 18 mmol/L — ABNORMAL LOW (ref 20–30)
BKR CO2: 22 mmol/L (ref 20–30)
BKR CREATININE: 4.35 mg/dL — ABNORMAL HIGH (ref 0.40–1.30)
BKR CREATININE: 4.54 mg/dL — ABNORMAL HIGH (ref 0.40–1.30)
BKR EGFR (AFR AMER): 17 mL/min/{1.73_m2} (ref >60)
BKR EGFR (AFR AMER): 16 mL/min/{1.73_m2} (ref >60)
BKR EGFR (NON AFRICAN AMERICAN): 14 mL/min/{1.73_m2} (ref >60)
BKR EGFR (NON AFRICAN AMERICAN): 13 mL/min/{1.73_m2} (ref >60)
BKR GLUCOSE: 120 mg/dL — ABNORMAL HIGH (ref 70–100)
BKR GLUCOSE: 101 mg/dL — ABNORMAL HIGH (ref 70–100)
BKR POTASSIUM: 5.8 mmol/L — ABNORMAL HIGH (ref 3.3–5.1)
BKR POTASSIUM: 5.2 mmol/L — ABNORMAL HIGH (ref 3.3–5.1)
BKR SODIUM: 135 mmol/L — ABNORMAL LOW (ref 136–144)
BKR SODIUM: 137 mmol/L (ref 136–144)

## 2019-11-19 LAB — ADENOVIRUS BY PCR     (LMW YH): BKR ADENOVIRUS BY RT-PCR: NOT DETECTED

## 2019-11-19 MED ORDER — DEXTROSE 50 % IN WATER (D50W) INTRAVENOUS SOLUTION
INTRAVENOUS | Status: DC | PRN
Start: 2019-11-19 — End: 2019-11-20

## 2019-11-19 MED ORDER — SODIUM ZIRCONIUM CYCLOSILICATE 10 GRAM ORAL POWDER PACKET
10 gram | Freq: Three times a day (TID) | ORAL | Status: DC
Start: 2019-11-19 — End: 2019-11-19

## 2019-11-19 MED ORDER — ENTERAL HYPOGLYCEMIA MANAGEMENT PRODUCT
1 | ORAL | Status: DC | PRN
Start: 2019-11-19 — End: 2019-11-20

## 2019-11-19 MED ORDER — FUROSEMIDE 40 MG TABLET
40 mg | Freq: Two times a day (BID) | ORAL | Status: DC
Start: 2019-11-19 — End: 2019-11-19

## 2019-11-19 MED ORDER — GLUCAGON 1 MG/ML IN STERILE WATER
Freq: Once | INTRAMUSCULAR | Status: DC | PRN
Start: 2019-11-19 — End: 2019-11-20

## 2019-11-19 MED ORDER — SODIUM ZIRCONIUM CYCLOSILICATE 10 GRAM ORAL POWDER PACKET
10 gram | Freq: Two times a day (BID) | ORAL | Status: DC
Start: 2019-11-19 — End: 2019-11-19

## 2019-11-19 MED ORDER — FUROSEMIDE 40 MG TABLET
40 mg | Freq: Two times a day (BID) | ORAL | Status: DC
Start: 2019-11-19 — End: 2019-11-20
  Administered 2019-11-19 – 2019-11-20 (×3): 40 mg via ORAL

## 2019-11-19 MED ORDER — SODIUM ZIRCONIUM CYCLOSILICATE 10 GRAM ORAL POWDER PACKET
10 gram | Freq: Three times a day (TID) | ORAL | Status: DC
Start: 2019-11-19 — End: 2019-11-20
  Administered 2019-11-19 – 2019-11-20 (×3): 10 gram via ORAL

## 2019-11-19 MED ORDER — ENTERAL HYPOGLYCEMIA MANAGEMENT PRODUCT < 50 MG/DL
1 | ORAL | Status: DC | PRN
Start: 2019-11-19 — End: 2019-11-20

## 2019-11-19 NOTE — Plan of Care
Plan of Care Overview/ Patient Status    A&Ox4, pleasant. Ambulating independently in room and in hallway. Denies pain. Afebrile. NSR on monitor. BP WNL. Lungs clear on room air. Renal diet, tolerating well, swallows pills whole with water. Voiding spontaneously in urinal. No episodes of diarrhea today. Skin intact. Unable to obtain IV access after multiple attempts by multiple providers, MD aware, patient refusing additional IV placement attempts at this time, stating that he would be willing to try again if IV access becomes needed. Currently not ordered for any IV medications. In for transfer to [address], awaiting bed assignment

## 2019-11-19 NOTE — Progress Notes
[facility]	SICU Progress NoteAttending Provider: Dawayne Patricia, MDPost-Operative Day/Post Injury Day Procedure(s):11/05/19: DDKT Hospital LOS: 2 days Interim History:  HPI: 59 y.o.?male?admitted to the SICU on 09/04 for Emergent Dialysis. 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 ?Renal u/s with moderately distended urinary bladder, satisfactory transplant kidney vascular hemodynamics. ?CXR without consolidation or cardiovascular abnormality. ?Labs sig K 8.0, Scr 5.6, BUN 67. ?In ED he was given Ca2+ gluc 1g, lokelma 10g, insulin reg 5U, Lasix 40mg ?PMH: FSGS, ESRD on HD, s/p DDRT, OSA, GERD, AV Fistula, Cirrhosis.?PSH: DDKT, R. AV-fistula, repair of brachiocephalic aneurysm?Home meds: norvasc, coreg 12.5, lipitor 40, Prograf 8/8, cellcept 1000mg  BID. ?Prednisone 10mg  QD. ?Fluconazole qd, Valganciclovir, Bactrim SS qd?SICU course:9/4: Arrives to SICU, EKG without acute abnormality, K 7.5. ?HR 69, BP 147/71, in good spirits, NAD. ??9/5: s/p dialysis. Repeat labs notable for K 5.4. OOB, walking around unit. Started on lasix BID, lokelma BID low K diet. Atovaqone in place of Bactrim. PM BMP with K 6.7. EKG unremarkable. Given insulin/dextrose, Ca.?9/6: AM Potassium 5.2. IV Lasix transitioned to PO lasix. Lokelma ordered for TID per nephrology. BID BMP. Adenovirus in process. No further episodes of loose stool today per nursing. OK for transfer to floor. Confirmed with nursing that hyaluronidase was not given yesterday after concern for extrav, given improvement in exam. Left arm examined and no concern for ischemia, sensation intact. Transplant team aware.  ?Review of Allergies/Meds/Hx: I have reviewed the patient's: allergies, past

## 2019-11-19 NOTE — Plan of Care
Plan of Care Overview/ Patient Status    Acute Dialysis Nursing Treatment Note  Dialysate Bath : 2 K 3 K	Access : R-AVF Duration of Treatment : 2 hoursNet Volume Ultrafiltrated :  0 LBlood pressure trends : SBPs: 130s-150s Medications administered : None  Comments : Received report from primary RN Crystal. AAOX4. R upper AVF cannulated without difficulty with 15 ga needles. Achieved BFR goal of 400. AVF free from redness, warmth and edema. Periods of apnea with sleep; RN notified.Achieved hemostasis at 20 min. Gauze applied.Tolerated treatment well, no complaints. Zeb Comfort, RN

## 2019-11-19 NOTE — Plan of Care
Plan of Care Overview/ Patient Status    Pt is alert and oriented X 4, follows commands, pupils equal and reactive. No temp. BP stable. NSR. BMP due at 4 am to trend potassium. Lungs sounds clear. Room air, CPAP at night. Abdomen is soft and nontender. See flowsheet for skin details. T&P independently. Herbie Baltimore, RN

## 2019-11-19 NOTE — Plan of Care
Plan of Care Overview/ Patient Status    Pt alert and oriented x 4. Ambulates ad lib in room. Spanish speaking; understands a little [place]. Received from SICU at about 4:30 pm via wheelchair. Team made aware. Pt has no IV access. Team notified. Due medication taken as ordered.FS 131. Wife at bedside. Needs attended. Will continue to monitor.

## 2019-11-19 NOTE — Progress Notes
SICU Attending Attestation:Brodyn Leretha Pol, who is a critically ill patient in the [facility] SICU, was examined at the bedside; the chart and new notes were reviewed, laboratory data reviewed, events of the past 24 hours discussed, diagnostic imaging reviewed and interpreted in full including the last CXR, and discussed care and coordinated management with: our ICU team, primary team, consulting services, our critical care nurses, and respiratory therapy. I was specifically asked to see the patient by the APPs due to the patient???s complex medical, and surgical, and postoperative evaluation and management. I have personally performed a face to face diagnostic evaluation on this patient, reviewed the chart, available data and care plan and have personally formulated the plan outlined below. The documentation herein outlines services that I personally provided.Attending Addendum:???I have examined the patient, reviewed the chart/available data, reviewed the Resident's/LIP's data and plan, and have personally formulated the plan outlined below. ??????Assessment:59 year-old man with history of ESRD s/p renal transplant who presented with hyperkalemia admitted to ICU for emergent hemodialysis.???PMH:???FSGS, ESRD on HD, s/p DDRT, OSA, GERD, AV Fistula, Cirrhosis.???PSH:???DDKT, R. AV-fistula, repair of brachiocephalic aneurysm???Current Critical Issues:Hyperkalemia, and hypertension.???Plan:???Neurologic: ???CAM negative.???Cardiac: HTN: - Continue amlodipine and carvedilol- Continue statin???Vascular: Good peripheral vascular examination.???Respiratory: OSA:- Nocturnal CPAP???Encourage IS and deep breathing.???GI/nutrition: Low K diet.???Renal/Fluids/electrolytes: Hyperkalemia:- S/p emergent dialysis???ESRD s/p renal transplant:- Monitor urine output???Immuno-suppression per transplant surgery and nephrology.???Infectious Disease: Prophylaxis with atovaquone, valgancyclovir and fluconazole.???Diarrheal workup per transplant nephrology.???Hematology: Chronic anemia:- Maintain Hb >7???Endocrine: No issues.???Musculoskeletal:  OOB to chair. PT/OT.???Skin/wound:  No issues.???Goals of Care/Advance Directives/Care Coordination: Full code.??????Prophylaxis:                  	GI: PPI                                       	VTE:  SC heparin/SCD's                          Dispo: Transfer to floor.???Plan discussed with: SICU Nursing/Primary Team/Respiratory Therapy/APPs???Critical care time:26 minutes (exclusive of separately documented procedures)???Chrissa Meetze Chawla9/08/2019 9:35 AM

## 2019-11-19 NOTE — Transfer Summaries
[facility]		SICU 7-1 Transfer NoteProcedure:11/05/19: DDKT???History:HPI:???59 y.o.???male???admitted to the SICU on 09/04 for Emergent Dialysis. 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 ???Renal u/s with moderately distended urinary bladder, satisfactory transplant kidney vascular hemodynamics. ???CXR without consolidation or cardiovascular abnormality. ???Labs sig K 8.0, Scr 5.6, BUN 67. ???In ED he was given Ca2+ gluc 1g, lokelma 10g, insulin reg 5U, Lasix 40mg ???PMH: FSGS, ESRD on HD, s/p DDRT, OSA, GERD, AV Fistula, Cirrhosis.???PSH: DDKT, R. AV-fistula, repair of brachiocephalic aneurysm???Home meds: norvasc, coreg 12.5, lipitor 40, Prograf 8/8, cellcept 1000mg  BID. ???Prednisone 10mg  QD. ???Fluconazole qd, Valganciclovir, Bactrim SS qdSICU course:9/4: Arrives to SICU, EKG without acute abnormality, K 7.5. ???HR 69, BP 147/71, in good spirits, NAD. ??????9/5:???s/p dialysis. Repeat labs notable for K 5.4. OOB, walking around unit. Started on lasix BID, lokelma BID low K diet. Atovaqone in place of Bactrim. PM BMP with K 6.7. EKG unremarkable. Given insulin/dextrose, Ca.???9/6: AM Potassium 5.2. IV Lasix transitioned to PO lasix. Lokelma ordered for TID per nephrology. BID BMP. Adenovirus in process. No further episodes of loose stool today per nursing. OK for transfer to floor. Confirmed with nursing that hyaluronidase was not given yesterday after concern for extrav, given improvement in exam. Left arm examined and no concern for ischemia, sensation intact. Transplant team aware.  ???		Assessment and plan:Neurologic: Analgesia-???Tylenol 650mg  q6h prn??????Cardiac: Hx of HTN, HLD???- Home norvasc 10mg  qd???-???Crestor 20mg  daily (in place of home Lipitor)???-???Home???Coreg 25mg  BID - Maintain continuous telemetry???Respiratory:??????Hx of OSA, former smoker- Tolerating RA- CPAP overnight while inpatient - Maintain O2 sats >92%, wean as tolerated- Encourage IS, deep breathing, coughing???GI/Nutrition: -???HD???diet ???PUD prophylaxis- Lansoprazole 30mg  daily??????Renal/Fluids/Lytes: FSGS s/p DDKT 10/2019;???Hyperkalemia- Nephrology following- Lasix 40mg ???PO???BID- Lokelma 10mg ???TID???- BID BMP ???Infectious Disease:???Loose stools -???Adenovirus in process -???Stool pathogen by PCR pending collection???- 9/4 BCx: NGTD - 9/5 UA negative??????Immuosupression???- Prednisone 10mg  qd- Cellcept???750mg  BID???-???Tacrolimus???8mg  BID-???Fluconazole???100mg  qd- Valgancyclovir 450mg  Mon/Thurs- Concern that Bactrim contributed to hyperkalemia ?????????????????????????????????????????????- Will???use Atovaquone 1500mg  daily instead, per conversation with ID (Dr. Glennie Isle) ??????Hematology:- Consider transfusion for H&H<7/21 or hemodynamic instability???DVT prophylaxis- Maintain SCDs at all times- HSQ - no adjustment to dosing per pharmacy ???Endocrine: Hyperglycemia-???check BG on daily BMP??????Musculoskeletal: - OOB daily - PT/OT evals if worry for patient deconditioning- Turn and reposition Q2 hours, PUP dressing???Device Review:11/18/2019??????Code status/Dispo: - Full code- Transfer to floor???Called to: transplant resident Vitals:  Temp:  [97.1 ???F (36.2 ???C)-98 ???F (36.7 ???C)] 97.5 ???F (36.4 ???C)Pulse:  [57-91] 80Resp:  [13-27] 27BP: (113-163)/(57-92) 163/68SpO2:  [93 %-100 %] 95 %Device (Oxygen Therapy): room airBased on the information above, the patient is stable for transfer.Ricci Barker, PA-CSICU APP

## 2019-11-19 NOTE — Other
[facility] HealthNephrology Consult NoteReason for Consult: Recent transplant hyperkalemia Consultation Requested By: Hollace Kinnier, MDHPI:Adam Harper is a 59 year old male with a history of hypertension, GERD, hyperlipidemia, OSA  ESRD secondary to FSGS, who???successfully underwent a deceased donor kidney transplant (L kidney to R iliac fossa- 1A/1V/1U -stented)??? with Campath induction on 11/05/2019 he required hemodialysis on the 2nd day after transplant for hyperkalemia.  He presents with a 3 day history of diarrhea that is watery nonbloody.  Greater than 4 bowel movements per day.  He reports that he ate a whole jug of orange juice.  He has been eating a lot of grapes recently.  He also has been having quite a bit of cranberry juice.  His dose of tacrolimus was also increased 2 days ago.  He has been taking Bactrim single dose daily.  He reports that yesterday and today he had been feeling weaker particularly when he was starting to get up. Last dose of Tac was 9:45 am. 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 A-V fistula (HC Code)   right arm ??? Anemia  ??? 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 Overview:  Added automatically from request for surgery [id number] Overview:  Added automatically from request for surgery [id number] ??? AV fistula infection (HC Code)  ??? Cirrhosis (HC Code)  ??? 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 GERD (gastroesophageal reflux disease)  ??? Hypercholesteremia  ??? Hyperparathyroidism (HC Code)  ??? Hypertension  ??? Infected prosthetic vascular graft, initial encounter (HC Code) 10/24/2014 ??? Influenza A  ??? Morbid obesity (HC Code) 02/03/2018 ??? Obstructive sleep apnea   pt uses CPAP ??? Secondary hyperparathyroidism of renal origin (HC Code)  ??? Thoracic aortic aneurysm (HC Code)  SocHx: Adam Harper reports that he has quit smoking. His smoking use included cigarettes. He has a 6.25 pack-year smoking history. He has never used smokeless tobacco. He reports that he does not drink alcohol and does not use drugs.FamHx: family history is not on file.Allergies:No Known AllergiesMedications:No current facility-administered medications on file prior to encounter. Current Outpatient Medications on File Prior to Encounter Medication Sig Dispense Refill ??? acetaminophen (TYLENOL) 325 mg tablet Take 2 tablets (650 mg total) by mouth every 6 (six) hours as needed for up to 10 days. 30 tablet 11 ??? amLODIPine (NORVASC) 10 mg tablet Take 1 tablet (10 mg total) by mouth every morning. 90 tablet 3 ??? atorvastatin (LIPITOR) 40 mg tablet Take 1 tablet (40 mg total) by mouth daily. 30 tablet 5 ??? carvediloL (COREG) 25 mg Immediate Release tablet Take 1 tablet (25 mg total) by mouth 2 (two) times daily with breakfast and dinner. 60 tablet 11 ??? ciprofloxacin HCl (CIPRO) 500 mg tablet Take 1 tablet (500 mg total) by mouth 2 (two) times daily. DO NOT TAKE UNTIL PRESCRIBER NOTIFIES YOU (Patient not taking: Reported on 11/12/2019) 4 tablet 0 ??? fluconazole (DIFLUCAN) 100 mg tablet Take 1 tablet (100 mg total) by mouth daily. 30 tablet 0 ??? mycophenolate mofetil (CELLCEPT) 250 mg capsule Take 4 capsules (1,000 mg total) by mouth 2 (two) times daily. (Patient taking differently: Take 750 mg by mouth 2 (two) times daily. ) 240 capsule 11 ??? omeprazole (PRILOSEC) 20 mg capsule Take  1 capsule (20 mg total) by mouth daily. 90 capsule 1 ??? predniSONE (DELTASONE) 5 mg tablet Take 2 tablets (10 mg total) by mouth daily. Take with food. 60 tablet 11 ??? senna-docusate (SENNA-PLUS) 8.6-50 mg tablet Take 1 tablet by mouth nightly. (Patient not taking: Reported on 11/12/2019)   ??? sulfamethoxazole-trimethoprim (BACTRIM,SEPTRA) 400-80 mg per tablet Take 1 tablet by mouth daily. 30 tablet 5 ??? tacrolimus (PROGRAF) 1 mg immediate release capsule Take 9 capsules (9 mg total) by mouth 2 (two) times daily. 540 capsule 11 ??? valGANciclovir (VALCYTE) 450 mg tablet Take 1 tablet (450 mg total) by mouth Every Monday and Thursday. 60 tablet 2 Scheduled Meds:Current Facility-Administered Medications Medication Dose Route Frequency Provider Last Rate Last Admin ??? [START ON 11/18/2019] heparin (porcine) injection 5,000 Units  5,000 Units Subcutaneous Q8H Toney Rakes, MD     ??? [START ON 11/18/2019] sodium chloride 0.9 % flush 3 mL  3 mL IV Push Q8H Persaud, Lynnda Shields, MD     Continuous Infusions:PRN Meds:.[COMPLETED] insulin regular human **AND** [COMPLETED] dextrose 50% in water (D50W) **AND** dextrose 50% in water (D50W) **AND** POCT GlucoseReview of Systems Constitutional: Negative for appetite change, fatigue and unexpected weight change. HENT: Negative for nosebleeds, sore throat, facial swelling, mouth sores and neck pain.  Respiratory: Negative for cough, chest tightness and shortness of breath.  Cardiovascular: Negative for chest pain, palpitations and leg swelling. Gastrointestinal: Negative for abdominal pain, diarrhea and constipation. Genitourinary: Negative for dysuria, frequency, hematuria, decreased urine volume and difficulty urinating. Musculoskeletal: Negative for arthralgias. Skin: Negative for rash. Neurological: Negative for weakness, light-headedness and headaches. Hematological: Negative for adenopathy. Psychiatric/Behavioral: Negative for dysphoric mood. PE:VS: Temp:  [98.5 ???F (36.9 ???C)-98.9 ???F (37.2 ???C)] 98.5 ???F (36.9 ???C)Pulse:  [60-68] 60Resp:  [16-18] 18BP: (130-135)/(67-76) 130/67SpO2:  [98 %-100 %] 100 %Weight change: SpO2:  [98 %-100 %] 100 % (09/04 1841)Ins//Outs: No intake/output data recorded.Net over last 24hrs: Gen:  Alert and oriented in no acute distressHEENT:  Moist mucous membranes no conjunctival icterusCV:  S1-S2 no murmur rubs or gallopsResp:  Clear to auscultation bilaterallyAbd:  Soft nontender nondistended incisions site healing well without drainageExt:  No lower extremity edema right arm fistula with thrill and bruit.LABS:Lab Results Component Value Date  WBC 8.6 11/17/2019  HGB 10.5 (L) 11/17/2019  HCT 32.0 (L) 11/17/2019  PLT 271 11/17/2019  NA 135 (L) 11/17/2019  K 8.0 (HH) 11/17/2019  CL 105 11/17/2019  CO2 17 (L) 11/17/2019  BUN 66 (H) 11/17/2019  CREATININE 5.6 (HH) 11/17/2019  GLU 128 (H) 11/17/2019  CALCIUM 9.4 11/17/2019  PHOS 4.5 11/15/2019  PTH 47.3 10/02/2019 MICROBIOLOGY:ReviewedRADIOLOGY:CXR (portable)Result Date: 9/4/2021No focal consolidation. Of note, radiograph is not sensitive for detection of subtle/early groundglass parenchymal changes. Report Initiated By:  Kathlen Brunswick, MD Reported And Signed By: Beverly Gust, MDAssessment: Adam Harper is a 59 year old male with a history of hypertension, GERD, hyperlipidemia, OSA  ESRD secondary to FSGS, who???successfully underwent a deceased donor kidney transplant (L kidney to R iliac fossa- 1A/1V/1U -stented)??? with Campath induction on 11/05/2019 he required hemodialysis on the 2nd day after transplant for hyperkalemia he is presenting again with hyperkalemia with serum potassium at 8.1.The cause of the hyperkalemia is likely multifactorial.  First he seems to have had pretty high intake of potassium particularly with the orange juice.  He is also on Bactrim.  As well as recent increase in the dose of tacrolimus may have all contributed to increased potassium intake and decreased renal secretion of potassium.  Fortunately he is presenting without significant EKG changes.  Has received medical therapy and is making urine.  Given significant elevation in potassium with improving but still impaired renal function I would favor doing dialysis on him particularly given his still has a functioning fistula.  Currently in the process of looking for room to dialyze.His graft appears functioning quite well and in fact his creatinine is actually decreasing suggestive of improving graft function.The cause of his diarrhea is not fully clear.  I have a low suspicion for an infectious process but is still worth evaluating him for it.Renal Plan:- will plan to dialyze.	2 hours on K of 2 calcium of 3 bicarb of 33.- make NPO until after dialysis. - If continues on medical management check BMP q.4 hours.- awaiting repeat potassium if still elevated would stay re-dose Lasix and insulin if he is not yet on dialysis.- if he does not have dialysis would favor giving him some isotonic fluid to help distal sodium delivery.- hold bactrim. - Will follow up infectious work up that includes CMV, stool pathogens. c diff, norovirus and adenovirus. Immune suppression - Would continue mycophenolate 1000 BID and prednisone 10 mg. - Hold tacrolimus need to establish a 12 hour trough level. Please obtain a trough at 10:00 pm. This would give [place] an appropriate trough. - Continue valcyte.Case discussed with Dr. Alyssa Grove, Please refer to his addendum for final recommendations. Daniel Gomez9/4/2021After hours and on [employer] please contact the Renal Fellow on call (see AMION)Preferred Contact: [facility]

## 2019-11-20 ENCOUNTER — Telehealth: Admit: 2019-11-20 | Payer: PRIVATE HEALTH INSURANCE | Primary: Family Medicine

## 2019-11-20 ENCOUNTER — Ambulatory Visit: Admit: 2019-11-20 | Payer: PRIVATE HEALTH INSURANCE | Attending: Cardiovascular Disease | Primary: Family Medicine

## 2019-11-20 ENCOUNTER — Ambulatory Visit: Admit: 2019-11-20 | Payer: PRIVATE HEALTH INSURANCE | Attending: Nephrology | Primary: Family Medicine

## 2019-11-20 DIAGNOSIS — Z79899 Other long term (current) drug therapy: Secondary | ICD-10-CM

## 2019-11-20 DIAGNOSIS — E7800 Pure hypercholesterolemia, unspecified: Secondary | ICD-10-CM

## 2019-11-20 DIAGNOSIS — Z6837 Body mass index (BMI) 37.0-37.9, adult: Secondary | ICD-10-CM

## 2019-11-20 DIAGNOSIS — Z87891 Personal history of nicotine dependence: Secondary | ICD-10-CM

## 2019-11-20 DIAGNOSIS — E785 Hyperlipidemia, unspecified: Secondary | ICD-10-CM

## 2019-11-20 DIAGNOSIS — Z20822 Contact with and (suspected) exposure to covid-19: Secondary | ICD-10-CM

## 2019-11-20 DIAGNOSIS — D84821 Immunodeficiency due to drugs: Secondary | ICD-10-CM

## 2019-11-20 DIAGNOSIS — Z940 Kidney transplant status: Secondary | ICD-10-CM

## 2019-11-20 DIAGNOSIS — G4733 Obstructive sleep apnea (adult) (pediatric): Secondary | ICD-10-CM

## 2019-11-20 DIAGNOSIS — I10 Essential (primary) hypertension: Secondary | ICD-10-CM

## 2019-11-20 DIAGNOSIS — E875 Hyperkalemia: Secondary | ICD-10-CM

## 2019-11-20 DIAGNOSIS — Z7952 Long term (current) use of systemic steroids: Secondary | ICD-10-CM

## 2019-11-20 DIAGNOSIS — D539 Nutritional anemia, unspecified: Secondary | ICD-10-CM

## 2019-11-20 DIAGNOSIS — K219 Gastro-esophageal reflux disease without esophagitis: Secondary | ICD-10-CM

## 2019-11-20 DIAGNOSIS — N2581 Secondary hyperparathyroidism of renal origin: Secondary | ICD-10-CM

## 2019-11-20 LAB — CBC WITH AUTO DIFFERENTIAL
BKR BUN / CREAT RATIO: 27.7 % — ABNORMAL LOW (ref 37.0–52.0)
BKR EGFR (AFR AMER): 109.1 fL — ABNORMAL HIGH (ref 78.0–94.0)
BKR WAM ABSOLUTE IMMATURE GRANULOCYTES: 0.1 x 1000/??L (ref 0.0–0.3)
BKR WAM ABSOLUTE LYMPHOCYTE COUNT: 0 x 1000/??L — ABNORMAL LOW (ref 1.0–4.0)
BKR WAM ABSOLUTE NRBC: 0 x 1000/??L (ref 0.0–0.0)
BKR WAM ANALYZER ANC: 7.3 x 1000/??L (ref 1.0–11.0)
BKR WAM BASOPHIL ABSOLUTE COUNT: 0 x 1000/??L (ref 0.0–0.0)
BKR WAM BASOPHILS: 0.3 % (ref 0.0–4.0)
BKR WAM EOSINOPHIL ABSOLUTE COUNT: 0 x 1000/??L (ref 0.0–1.0)
BKR WAM EOSINOPHILS: 0.3 % (ref 0.0–7.0)
BKR WAM HEMATOCRIT: 27.7 % — ABNORMAL LOW (ref 37.0–52.0)
BKR WAM HEMOGLOBIN: 9.3 g/dL — ABNORMAL LOW (ref 12.0–18.0)
BKR WAM IMMATURE GRANULOCYTES: 0.7 % (ref 0.0–3.0)
BKR WAM LYMPHOCYTES: 0.5 % — ABNORMAL LOW (ref 8.0–49.0)
BKR WAM MCH (PG): 36.6 pg — ABNORMAL HIGH (ref 27.0–31.0)
BKR WAM MCHC: 33.6 g/dL (ref 31.0–36.0)
BKR WAM MCV: 109.1 fL — ABNORMAL HIGH (ref 78.0–94.0)
BKR WAM MONOCYTE ABSOLUTE COUNT: 0.2 x 1000/??L (ref 0.0–2.0)
BKR WAM MONOCYTES: 2.1 % — ABNORMAL LOW (ref 4.0–15.0)
BKR WAM MPV: 9.8 fL (ref 6.0–11.0)
BKR WAM NEUTROPHILS: 96.1 % — ABNORMAL HIGH (ref 37.0–84.0)
BKR WAM NUCLEATED RED BLOOD CELLS: 0 % (ref 0.0–1.0)
BKR WAM PLATELETS: 220 x1000/??L (ref 140–440)
BKR WAM RDW-CV: 13.4 % (ref 11.5–14.5)
BKR WAM RED BLOOD CELL COUNT: 2.5 M/??L — ABNORMAL LOW (ref 3.8–5.9)
BKR WAM WHITE BLOOD CELL COUNT: 7.6 x1000/??L — ABNORMAL HIGH (ref 4.0–10.0)

## 2019-11-20 LAB — BASIC METABOLIC PANEL
BKR ANION GAP: 19 — ABNORMAL HIGH (ref 7–17)
BKR BLOOD UREA NITROGEN: 50 mg/dL — ABNORMAL HIGH (ref 6–20)
BKR CALCIUM: 8.9 mg/dL (ref 8.8–10.2)
BKR CHLORIDE: 97 mmol/L — ABNORMAL LOW (ref 98–107)
BKR CO2: 19 mmol/L — ABNORMAL LOW (ref 20–30)
BKR CREATININE: 4.44 mg/dL — ABNORMAL HIGH (ref 0.40–1.30)
BKR EGFR (NON AFRICAN AMERICAN): 14 mL/min/{1.73_m2} (ref >60)
BKR GLUCOSE: 91 mg/dL (ref 70–100)
BKR POTASSIUM: 4.2 mmol/L (ref 3.3–5.1)

## 2019-11-20 LAB — PHOSPHORUS     (BH GH L LMW YH): BKR PHOSPHORUS: 5.7 mg/dL — ABNORMAL HIGH (ref 2.2–4.5)

## 2019-11-20 LAB — MAGNESIUM: BKR MAGNESIUM: 1.6 mg/dL — ABNORMAL LOW (ref 1.7–2.4)

## 2019-11-20 LAB — TACROLIMUS LEVEL     (BH GH L LMW YH): BKR TACROLIMUS BLOOD: 7.7 ng/mL

## 2019-11-20 LAB — ROTAVIRUS ANTIGEN, STOOL     (GH L): BKR ROTAVIRUS AG, EIA: NEGATIVE

## 2019-11-20 MED ORDER — ATOVAQUONE 750 MG/5 ML ORAL SUSPENSION
7505 mg/5 mL | ORAL | 7 refills | 21.00 days | Status: AC
Start: 2019-11-20 — End: 2019-12-06
  Filled 2019-11-21: qty 210, 21d supply, fill #1
  Filled 2019-11-21: qty 210, 21d supply, fill #0

## 2019-11-20 MED ORDER — FUROSEMIDE 40 MG TABLET
40 mg | ORAL_TABLET | Freq: Two times a day (BID) | ORAL | 2 refills | 15.00 days | Status: AC
Start: 2019-11-20 — End: 2019-12-06
  Filled 2019-11-21: qty 30, 15d supply, fill #1
  Filled 2019-11-21: qty 30, 15d supply, fill #0

## 2019-11-20 MED ORDER — TACROLIMUS IMMEDIATE RELEASE 1 MG CAPSULE
1 mg | ORAL_CAPSULE | Freq: Two times a day (BID) | ORAL | 12 refills | 30.00 days | Status: AC
Start: 2019-11-20 — End: 2019-12-06

## 2019-11-20 MED ORDER — SODIUM ZIRCONIUM CYCLOSILICATE 10 GRAM ORAL POWDER PACKET
10 gram | PACK | Freq: Three times a day (TID) | ORAL | 2 refills | 5.00 days | Status: AC
Start: 2019-11-20 — End: 2019-11-22

## 2019-11-20 MED ORDER — MYCOPHENOLATE MOFETIL 250 MG CAPSULE
250 mg | ORAL_CAPSULE | Freq: Two times a day (BID) | ORAL | 12 refills | 30.00 days | Status: AC
Start: 2019-11-20 — End: 2020-06-05
  Filled 2019-12-10: qty 240, 30d supply, fill #6
  Filled 2019-12-10: qty 240, 30d supply, fill #0

## 2019-11-20 NOTE — Care Coordination-Inpatient
I identified my role as TC from Care Management department .  Medicare Important Message explained to patient over the phone.  Patient stated understanding the notice and was in agreement with the discharge plan.  Patient advised of the right to call the [employer] [facility]) at [phone] if not in agreement with the discharge from the hospital.  Copy mailed to patient after confirming address.[facility] Transition CoordinatorMHB : [phone]

## 2019-11-20 NOTE — Plan of Care
Adam Harper			Location: [place] 1O/109-U6 y.o., male				Attending: Dawayne Patricia, MD	Admit Date: 11/17/2019			[id number] LOS: 3 days TRANSPLANT NUTRITION ASSESSMENT: INITIAL No Known AllergiesAnthropometrics:Height: Ht Readings from Last 1 Encounters: 11/18/19 5' 9\ (1.753 m) Current Weight: Wt Readings from Last 1 Encounters: 11/20/19 111.5 kg  Admit Weight: 115.7 kg (bedscale)BMI: Body mass index is 36.3 kg/m???. (based on current wt)Dosing WT: 80 kg ([place] + 10%)Additional Weight Information: Ht previously confirmed. Pt previously with long term UBW 112-116kg. Wts this admission likely fluctuating 2/2 fluid status. 1+ trace edema at this time, per RN flowsheets. Will continue to monitor wts as available. Nutrition focused physical assessment: Pt appears well nourished. No overt signs of muscle wasting / fat depletion.ESTIMATED NUTRITION REQUIREMENTS:Kcal/day: 2400 (30 kcal/kg)		Protein/day: 120g (1.5 gm/kg)Fluids/day: Fluid management per medical team discretion. (106mL/kcal)  Needs based on: Dosing wt (80 kg), ESRD s/p DDKT (8/22), AKICURRENT DIET ORDER: Hemodialysis (2g K) NUTRITION ASSESSMENT:Cultural/Religious/Ethnic Needs: Yes Spanish speaking, requiring interpreterDiscussed patient at interdisciplinary rounds. Patient's chart reviewed for verbal c/s recevied, pt admitted for emergent HD for potassium 8.1 mmol/L. Pt with PMHx significant for DDKT 11/04/19. Met with pt at bedside with assistance from over the phone [facility] Spanish interpreter. He reports significant intake of high potassium foods PTA (jug of orange juice, large quantities of fruit and various fruit juices). Pt states that he though potassium was lower in juice form vs whole fruit form. Misinformation corrected. Otherwise reports good intake of lean proteins, particularly fish and chicken. Pt tolerating diet, likely meeting estimated needs. Amenable to dietary changes upon discharge to reduce overall potassium intake. Education provided as outlined below. Labs reviewed. Serum sodium slightly low at 135 mmol/L. Serum potassium trending down with medical treatment, now WNL at 4.2 mmol/L today. Lokelma remains on board. Magnesium low at 1.6 mg/dL. Phosphorus elevated at 5.7 mg/dL. No binders at this time. FSBG range 72-130 mg/dL over the past 47WGN. Insulin regimen per team. NUTRITION DIAGNOSIS/MALNUTRITION CLASSIFICATION:Altered lab values (serum potassium) related to limited understanding of previous nutrition education provided as evidenced by potassium of  8.1 mmol/L on admissionINTERVENTIONS/RECOMMENDATIONS: Meal and Snacks: ???	Composition of meals and snacks-- Agree with 2g K diet at this time ???	Modify distribution, type or amount of food and nutrients within meals or at specified times -- continue to encourage frequent kcal/pro rich PO intakes ???	Specific foods/beverages or groups -- encourage good source of lean protein at each meal and snackMedical Food Supplements: ???	Commercial beverage: not indicated at this timeNutrition-related medication management: ???	Lokelma for hyperkalemia per teamNutrition Education:???	Provided written and verbal education regarding low potassium diet. Encouraged significant decrease of all juice intake, avoidance of orange juice, discussed appropriate portion sizes of fruits and encouraged no more than 1 serving of high potassium foods per day at this time. Questions answered to satisfaction. Goals:???	Patient will attempt to recall 3 high potassium foods by next nutrition visit- status: new goalCollaboration and Referral of Nutrition Care???	Team meeting- discussed above at interdisciplinary rounds???	Collaboration with other providers- Discussed above with teamDischarge and Transfer of Nutrition Care to New Setting or Provider???	Nutrition related discharge needs assessed and no needs identified at this time. ???	Diet advanced, patient tolerating.???	Patient has transplant nutrition's contact information and knows to contact should questions or nutrition needs arise. ???	No further nutrition needs noted at this time.  No nutritional barriers to discharge identifiedMONITORING / EVALUATION:???	Will continue to follow and adjust nutrition plan as needed/consulted. ???	Upon weekly follow up, will subjectively monitor and assess as parameters available: weight change, energy intake, protein intake, electrolyte/renal profile, glucose profile, gastrointestinal status, overall nutritional status, ability  to recall nutrition goals as needed/appropriate.Newton Pigg, RDTransplant Dietitian [facility] (M-F 8:00-4:00PM): [phone] Role TXP RD: [phone] note: Nutrition is a consult only service on weekends and holidays.  Please enter a consult in EPIC if assistance is needed on a weekend or holiday or call [phone] to be directed to covering RD.

## 2019-11-20 NOTE — Plan of Care
Plan of Care Overview/ Patient Status    1900-0700: A&Ox4, spanish speaking.  No C/O paint his shift.  Ambulates independently.  Spontaneous void.  Awaiting stool sample, patient aware.  Tele in place with HR in the 70s.  Incision CDI.  Safety maintained.  See flow sheet for details.Problem: Adult Inpatient Plan of CareGoal: Plan of Care ReviewOutcome: Interventions implemented as appropriateGoal: Patient-Specific Goal (Individualized)Outcome: Interventions implemented as appropriateGoal: Absence of Hospital-Acquired Illness or InjuryOutcome: Interventions implemented as appropriateGoal: Optimal Comfort and WellbeingOutcome: Interventions implemented as appropriateGoal: Readiness for Transition of CareOutcome: Interventions implemented as appropriate Problem: Impaired Wound HealingGoal: Optimal Wound HealingOutcome: Interventions implemented as appropriate Problem: Adjustment to Transplant (Kidney Transplant)Goal: Optimal Coping with Organ TransplantOutcome: Interventions implemented as appropriate Problem: Bleeding (Kidney Transplant)Goal: Absence of BleedingOutcome: Interventions implemented as appropriate Problem: Bowel Motility Impaired (Kidney Transplant)Goal: Effective Bowel EliminationOutcome: Interventions implemented as appropriate Problem: Donor Organ Function (Kidney Transplant)Goal: Effective Renal FunctionOutcome: Interventions implemented as appropriate Problem: Fluid Imbalance (Kidney Transplant)Goal: Fluid BalanceOutcome: Interventions implemented as appropriate Problem: Infection (Kidney Transplant)Goal: Absence of Infection Signs and SymptomsOutcome: Interventions implemented as appropriate Problem: Ongoing Anesthesia Effects (Kidney Transplant)Goal: Anesthesia/Sedation RecoveryOutcome: Interventions implemented as appropriate Problem: Oral Intake Inadequate (Kidney Transplant)Goal: Optimal Nutrition IntakeOutcome: Interventions implemented as appropriate Problem: Pain (Kidney Transplant)Goal: Acceptable Pain ControlOutcome: Interventions implemented as appropriate Problem: Postoperative Nausea and Vomiting (Kidney Transplant)Goal: Nausea and Vomiting ReliefOutcome: Interventions implemented as appropriate Problem: Electrolyte Imbalance (Acute Kidney Injury/Impairment)Goal: Serum Electrolyte BalanceOutcome: Interventions implemented as appropriate Problem: Fluid Imbalance (Acute Kidney Injury/Impairment)Goal: Optimal Fluid BalanceOutcome: Interventions implemented as appropriate Problem: Hematologic Alteration (Acute Kidney Injury/Impairment)Goal: Hemoglobin, Hematocrit and Platelets Within Normal RangeOutcome: Interventions implemented as appropriate Problem: Oral Intake Inadequate (Acute Kidney Injury/Impairment)Goal: Optimal Nutrition IntakeOutcome: Interventions implemented as appropriate Problem: Renal Function Impairment (Acute Kidney Injury/Impairment)Goal: Effective Renal FunctionOutcome: Interventions implemented as appropriate Problem: Electrolyte ImbalanceGoal: Electrolyte BalanceOutcome: Interventions implemented as appropriate

## 2019-11-20 NOTE — Progress Notes
[facility]	 [facility]	Transplant Nephrology Progress NoteS:Feeling well. No further episodes of hypokalemia. Urine output is good. Intake/Output Summary (Last 24 hours) at 11/20/2019 1221Last data filed at 11/20/2019 0900Gross per 24 hour Intake 960 ml Output 2250 ml Net -1290 ml MEDICATIONS:Scheduled Meds:Current Facility-Administered Medications Medication Dose Route Frequency Provider Last Rate Last Admin ??? amLODIPine (NORVASC) tablet 10 mg  10 mg Oral Daily Toney Rakes, MD   10 mg at 11/20/19 1914 ??? atovaquone (MEPRON) suspension 1,500 mg  1,500 mg Oral With [place] Jetty, Maurine Simmering, MD   1,500 mg at 11/19/19 1805 ??? carvediloL (COREG) Immediate Release tablet 25 mg  25 mg Oral BID WC Toney Rakes, MD   25 mg at 11/20/19 7829 ??? fluconazole (DIFLUCAN) tablet 100 mg  100 mg Oral Daily Toney Rakes, MD   100 mg at 11/20/19 5621 ??? furosemide (LASIX) tablet 40 mg  40 mg Oral BID Judith Blonder, MD   40 mg at 11/20/19 3086 ??? heparin (porcine) injection 5,000 Units  5,000 Units Subcutaneous Q8H Toney Rakes, MD   5,000 Units at 11/20/19 0546 ??? hyaluronidase (human) 150 unit/mL injection 150 Units  150 Units Subcutaneous Once Cormier, Maurine Simmering, MD     ??? lansoprazole (PREVACID) oral suspension (3 mg/mL) 30 mg  30 mg Oral Daily Cormier, Maurine Simmering, MD   30 mg at 11/20/19 5784 ??? mycophenolate mofetil (CELLCEPT) capsule 750 mg  750 mg Oral Q12H Toney Rakes, MD   750 mg at 11/20/19 6962 ??? predniSONE (DELTASONE) tablet 10 mg  10 mg Oral Daily Toney Rakes, MD   10 mg at 11/20/19 9528 ??? rosuvastatin (CRESTOR) tablet 20 mg  20 mg Oral Daily Toney Rakes, MD   20 mg at 11/20/19 4132 ??? sodium zirconium cyclosilicate (LOKELMA) oral powder packet 10 g  10 g Oral TID Bettey Costa, PA   10 g at 11/20/19 4401 ??? tacrolimus (PROGRAF) immediate release capsule 8 mg  8 mg Oral BID Cormier, Maurine Simmering, MD   8 mg at 11/20/19 0546 ??? valGANciclovir (VALCYTE) tablet 450 mg  450 mg Oral Q Mon & Thurs (0900) Toney Rakes, MD   450 mg at 11/19/19 0820 Continuous Infusions:PRN Meds:.acetaminophen, dextrose 50% in water (D50W), dextrose 50% in water (D50W), Enteral Hypoglycemia Management if Blood Glucose 50 - 69 mg/dL or 70 - 79 mg/dL with symptoms of Hypoglycemia, Enteral Hypoglycemia Management if Blood Glucose less than 50 mg/dL, glucagonPE:Vitals:  02/72/53 1105 BP: 107/64 Pulse: 71 Resp: 16 Temp: 97.7 ???F (36.5 ???C) Gen:  Alert and oriented in no acute distressHEENT:  Moist mucous membranes no conjunctival icterusCV:  S1-S2 no murmur rubs or gallopsResp:  Clear to auscultation bilaterallyAbd:  Soft nontender nondistended incisions site healing well without drainageExt:  No lower extremity edema right arm fistula with thrill and bruit.LABS:LabsRecent Labs Lab 09/04/211737 09/05/210741 09/07/210530 WBC 8.6 6.5 7.6 HGB 10.5* 10.3* 9.3* HCT 32.7* 30.3* 27.7* PLT 271 257 220  Recent Labs Lab 09/05/212048 09/05/212048 09/06/210506 09/07/210649 09/07/211200 NA 135*  --  137 135*  --  K 5.8*  --  5.2* 4.2  --  CL 100  --  100 97*  --  CO2 18*  --  22 19*  --  BUN 46*  --  48* 50*  --  CREATININE 4.35*  --  4.54* 4.44*  --  GLU 120*   < > 101* 91 133* ANIONGAP 17  --  15 19*  --   < > =  values in this interval not displayed. Recent Labs Lab 09/05/212048 09/06/210506 09/07/210649 CALCIUM 9.0 9.1 8.9 MG  --   --  1.6* PHOS  --   --  5.7*  No results for input(s): PTT, LABPROT, INR in the last 168 hours. Recent Labs Lab 09/04/211737 09/05/210741 ALT 20 16 AST 17 15 ALKPHOS 63 65 BILITOT 0.3 0.6 BILIDIR <0.2  --   MICROBIOLOGY:ReviewedRADIOLOGY:CXR (portable)Result Date: 9/4/2021No focal consolidation. Of note, radiograph is not sensitive for detection of subtle/early groundglass parenchymal changes. Report Initiated By:  Kathlen Brunswick, MD Reported And Signed By: Beverly Gust, MDUS Renal Transplant With DopplerResult Date: 9/4/2021Satisfactory appearance of the renal transplant and vascular hemodynamics. Report Initiated By:  Gwendlyn Deutscher, MD Reported And Signed By: Levonne Spiller, MDAssessment:Adam Harper is a 59 year old male with a history of hypertension, GERD, hyperlipidemia, OSA  ESRD???secondary to???FSGS, who???successfully underwent???a???deceased donor kidney transplant (L kidney to R iliac fossa- 1A/1V/1U -stented)??????with Campath induction on 11/05/2019 he required hemodialysis on the 2nd day after transplant for hyperkalemia he is presenting again with hyperkalemia with serum potassium at 8.1.He quite prone to hyperkalemic episodes as he has multiple contributors. I suspect that he may be best managed with medically therapy and will likely require that he go home on medical management for hyperkalemia. Graft function continues to work. Renal Plan:- Low potassium diet. - he is stable to be discharged on lokelma BID. And lasix 40 mg oral BID.- Follow up K as an outpatient. - Nutrition to educate patient. Immune suppression - Would continue mycophenolate 1000 BID and prednisone 10 mg. - Resume Tacrolimus 8 mg BID. - Continue valcyte.- atovaquone for PJP prophylaxis. Case was discussed with Dr. Lucianne Muss,  please refer to his addendum for his final recommendations. Talal Fritchman Gomez9/7/2021After hours and on [employer] please contact the Renal Fellow on call (see AMION)

## 2019-11-20 NOTE — Discharge Summary
day post-op, but then progressed well and was discharged on POD#4 with a functioning graft. He continued to make urine outpatient and followed up closely in the clinic. Of note, his MMF was lowered on 9/3 to 750mg  BID for diarrhea.In the ED he is hemodynamically stable, but labs reveal a potassium of 8.1.  He was admitted to SICU for urgent HD. On hospital day 1, K+ was again elevated, this was medically managed and he was also given Lokelma, Lasix and Bactrim was switched to Atovaquone. Renal US showed appropriate flow through the graft. He remained stable on hospital day 2 and was transferred to [facility]. Labs continue to improve with K+ of 4.2 and sCR down to 4.44. He was deemed safe for dc home today with oral diuretic and follow up appointment on Thursday, 9/9. He was also seen by nutrition team to reinforce low K+ diet. Upon discharge, the patient was afebrile, hemodynamically stable, tolerating diet, voiding spontaneously, and the pain was well controlled with oral analgesics. He was instructed to continue Lasix and Lokelma on discharge. Pertinent lab findings and test results: Objective: BMPRecent Labs Lab 09/05/212048 09/06/210506 09/07/210649 NA 135* 137 135* K 5.8* 5.2* 4.2 BUN 46* 48* 50* CREATININE 4.35* 4.54* 4.44* CBCRecent Labs Lab 09/04/211737 09/04/211744 09/05/210741 09/07/210530 WBC 8.6  --  6.5 7.6 HGB 10.5*  --  10.3* 9.3* HCT 32.7* 32.0* 30.3* 27.7* PLT 271  --  257 220 LFTsRecent Labs Lab 09/04/211737 09/05/210741 ALT 20 16 AST 17 15 ALKPHOS 63 65 BILITOT 0.3 0.6 BILIDIR <0.2  --  Micro:Lab Results Component Value Date  LABBLOO No Growth to Date 11/17/2019  LABBLOO No Growth to Date 11/17/2019 Diet: Low K+ dietMobility: Highest Level of mobility - ACTUAL: Mobility Level 8, Walk 250+ feet AM PAC 24Physical Therapy Disposition Recommendation: HomeAdditional Physical Therapy Disposition

## 2019-11-20 NOTE — Plan of Care
Plan of Care Overview/ Patient Status    Pt alert and oriented x 4. Ambulates ad lib. Spanish speaking but understands a little bit of [place]. Due medication taken ordered. Tele NSR. With orders for discharge. Discharge paperworks given to pt. Awaiting for wife to go home. Needs attended.

## 2019-11-20 NOTE — Progress Notes
Adam Harper	 Adam Harper	Transplant Nephrology Progress NoteS:Feeling wellWas hyperkalemic again overnight but managed medically. No diarrhea no chest pain. MEDICATIONS:Scheduled Meds:Current Facility-Administered Medications Medication Dose Route Frequency Provider Last Rate Last Admin ??? amLODIPine (NORVASC) tablet 10 mg  10 mg Oral Daily Adam Rakes, MD   10 mg at 11/19/19 0820 ??? atovaquone (MEPRON) suspension 1,500 mg  1,500 mg Oral With [place], Adam Simmering, MD   1,500 mg at 11/18/19 1721 ??? carvediloL (COREG) Immediate Release tablet 25 mg  25 mg Oral BID WC Adam Rakes, MD   25 mg at 11/19/19 0820 ??? fluconazole (DIFLUCAN) tablet 100 mg  100 mg Oral Daily Adam Rakes, MD   100 mg at 11/19/19 0820 ??? furosemide (LASIX) tablet 40 mg  40 mg Oral BID Adam Blonder, MD   40 mg at 11/19/19 1025 ??? heparin (porcine) injection 5,000 Units  5,000 Units Subcutaneous Q8H Adam Rakes, MD   5,000 Units at 11/19/19 8295 ??? hyaluronidase (human) 150 unit/mL injection 150 Units  150 Units Subcutaneous Once Adam Harper, Adam Simmering, MD     ??? lansoprazole (PREVACID) oral suspension (3 mg/mL) 30 mg  30 mg Oral Daily Adam Harper, Adam Simmering, MD   30 mg at 11/19/19 0820 ??? mycophenolate mofetil (CELLCEPT) capsule 750 mg  750 mg Oral Q12H Adam Rakes, MD   750 mg at 11/19/19 0820 ??? predniSONE (DELTASONE) tablet 10 mg  10 mg Oral Daily Adam Rakes, MD   10 mg at 11/19/19 0820 ??? rosuvastatin (CRESTOR) tablet 20 mg  20 mg Oral Daily Adam Rakes, MD   20 mg at 11/19/19 0820 ??? sodium zirconium cyclosilicate (LOKELMA) oral powder packet 10 g  10 g Oral BID Adam Harper     ??? tacrolimus (PROGRAF) immediate release capsule 8 mg  8 mg Oral BID Adam Lowers, MD   8 mg at 11/19/19 6213 ??? valGANciclovir (VALCYTE) tablet 450 mg  450 mg Oral Q Mon & Thurs (0900) Adam Rakes, MD   450 mg at 11/19/19 0820 Continuous Infusions:PRN Meds:.acetaminophen, dextrose 50% in water (D50W), dextrose 50% in water (D50W), Enteral Hypoglycemia Management if Blood Glucose 50 - 69 mg/dL or 70 - 79 mg/dL with symptoms of Hypoglycemia, Enteral Hypoglycemia Management if Blood Glucose less than 50 mg/dL, glucagonPE:Vitals:  08/65/78 1200 BP: (!) 163/68 Pulse: 78 Resp: (!) 27 Temp: 97.5 ???F (36.4 ???C) Gen:  Alert and oriented in no acute distressHEENT:  Moist mucous membranes no conjunctival icterusCV:  S1-S2 no murmur rubs or gallopsResp:  Clear to auscultation bilaterallyAbd:  Soft nontender nondistended incisions site healing well without drainageExt:  No lower extremity edema right arm fistula with thrill and bruit.LABS:LabsRecent Labs Lab 09/02/210857 09/02/210857 09/04/211737 09/05/210741 WBC 4.7  --  8.6 6.5 HGB 10.6*  --  10.5* 10.3* HCT 33.1*   < > 32.7* 30.3* PLT 272  --  271 257  < > = values in this interval not displayed.  Recent Labs Lab 09/05/211430 09/05/212048 09/06/210506 NA 137 135* 137 K 6.7* 5.8* 5.2* CL 101 100 100 CO2 23 18* 22 BUN 41* 46* 48* CREATININE 4.28* 4.35* 4.54* GLU 117* 120* 101* ANIONGAP 13 17 15  Recent Labs Lab 09/05/210741 09/05/211430 09/05/212048 09/06/210506 CALCIUM 9.4 9.7 9.0 9.1 MG 1.7  --   --   --  PHOS 3.9  --   --   --   No results for input(s): PTT, LABPROT, INR in the last 168 hours. Recent Labs Lab 09/04/211737 09/05/210741  ALT 20 16 AST 17 15 ALKPHOS 63 65 BILITOT 0.3 0.6 BILIDIR <0.2  --   MICROBIOLOGY:ReviewedRADIOLOGY:CXR (portable)Result Date: 9/4/2021No focal consolidation. Of note, radiograph is not sensitive for detection of subtle/early groundglass parenchymal changes. Report Initiated By:  Adam Brunswick, MD Reported And Signed By: Adam Harper, MDUS Renal Transplant With DopplerResult Date: 9/4/2021Satisfactory appearance of the renal transplant and vascular hemodynamics. 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 I suspect that he may be best managed with medically therapy and will likely require that he go home on medical management for hyperkalemia. Tac level at goal after evening trough was measured. Graft function continues to work. Renal Plan:- Low potassium diet. - Would continue to give lasix. - Please check BMP BID.  - Would also give lokelma 10 g TID; he will likely need to go home on lokelma TID. - Continue giving lasix 40 BID. Immune suppression - Would continue mycophenolate 750 BID and prednisone 10 mg. - Resume Tacrolimus 8 mg BID. - Continue valcyte.- Will need to discuss with ID alternative for bactrim for toxo and PJP prophylaxis, currently on atovaquone for PJP. Case was discussed with Dr. Lucianne Muss,  please refer to his addendum for his final recommendations. Adam Sackrider Gomez9/6/2021After hours and on [employer] please contact the Renal Fellow on call (see AMION)

## 2019-11-20 NOTE — Progress Notes
[facility]   Transplant Surgery Progress NoteAttending Provider: Dawayne Patricia, MDAdmit Date: 9/4/2021Hospital Day: 4  s/p Code status: Full Code/ACLSAllergy: Patient has no known allergies.Subjective: - No acute events overnight- Now doing well on the floor- Awaiting labs this AMObjective: Temp:  [97.1 ?F (36.2 ?C)-98.2 ?F (36.8 ?C)] 97.7 ?F (36.5 ?C)Pulse:  [57-82] 66Resp:  [13-27] 18BP: (106-163)/(61-83) 106/76SpO2:  [95 %-100 %] 95 %Device (Oxygen Therapy): room airI/O last 3 completed shifts:In: 1200 [P.O.:1200]Out: 2200 [Urine:2200]CBCLab Results Component Value Date  WBC 6.5 11/18/2019  HGB 10.3 (L) 11/18/2019  HCT 30.3 (L) 11/18/2019  PLT 257 11/18/2019 LYTESLab Results Component Value Date  NA 137 11/19/2019  K 5.2 (H) 11/19/2019  CL 100 11/19/2019  CO2 22 11/19/2019  BUN 48 (H) 11/19/2019  CREATININE 4.54 (H) 11/19/2019  GLU 130 (H) 11/19/2019  CALCIUM 9.1 11/19/2019  MG 1.7 11/18/2019  PHOS 3.9 11/18/2019 LFTsLab Results Component Value Date  BILITOT 0.6 11/18/2019  BILIDIR <0.2 11/17/2019  AST 15 11/18/2019  ALT 16 11/18/2019  ALKPHOS 65 11/18/2019  LIPASE 46 11/17/2019 PT/INR/PTTLab Results Component Value Date  INR 1.06 11/05/2019  PTT 26.0 11/05/2019 Microbiology:N/ADiagnostics:No results found.PHYSICAL EXAMGEN: NAD, lying comfortably in bedNEURO: Awake, alert, oriented x3, responding appropriatelyHEENT: NCATCV: Regular rate, regular rhythmPULM: Breathing comfortably on RAABD: Soft, non-tender, non-distendedEXT: Wet, warm & perfused, no restriction to movement, no edemaAssessment Patient is a 59 y.o. 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 He is recovering as expected with hyperkalemia again after dialysis.Plan ?	Folllow-up AM labs?	CV: Continue home meds - Norvasc, Lipitor, Coreg?	Renal diet, Bowel regimen?	Wound care: monitor?	GU: Freely voiding. Continue to measure.?	No drains?	IS Prednisone 10 mg QD, Tac 8./8, MMF 1000/1000?	DVT ppx: SQH?	Appreciate Transplant Nephrology recs?	Lasic 40mg  BID, Lokelma 10g BID?	Ppx: Atovaquone (avoid Bactrim due to Hyperkalemia)  Finals plans per attending/senior addendum Notifications : For questions, please page: Transplant SurgeryElectronically Signed ZO:XWRUEA Lemmie Steinhaus, MBBS9/09/2019, 9:24 AM Attending Addendum:

## 2019-11-21 NOTE — Telephone Encounter
Spanish Interpreter: Maria/ Interpreter ID [phone] Called patient back regarding issues of missing medication which was suppose to start today. Reminded patient that Outpatient pharmacy have probably tried to contact him regarding delivery.Called Outpatient Pharmacy who will contact patient regarding medication delivery.

## 2019-11-22 ENCOUNTER — Inpatient Hospital Stay: Admit: 2019-11-22 | Discharge: 2019-11-22 | Payer: PRIVATE HEALTH INSURANCE | Primary: Family Medicine

## 2019-11-22 ENCOUNTER — Ambulatory Visit: Admit: 2019-11-22 | Payer: PRIVATE HEALTH INSURANCE | Attending: Internal Medicine | Primary: Family Medicine

## 2019-11-22 ENCOUNTER — Encounter: Admit: 2019-11-22 | Payer: PRIVATE HEALTH INSURANCE | Attending: Internal Medicine | Primary: Family Medicine

## 2019-11-22 ENCOUNTER — Encounter: Admit: 2019-11-22 | Payer: PRIVATE HEALTH INSURANCE | Primary: Family Medicine

## 2019-11-22 DIAGNOSIS — Z7952 Long term (current) use of systemic steroids: Secondary | ICD-10-CM

## 2019-11-22 DIAGNOSIS — K219 Gastro-esophageal reflux disease without esophagitis: Secondary | ICD-10-CM

## 2019-11-22 DIAGNOSIS — I10 Essential (primary) hypertension: Secondary | ICD-10-CM

## 2019-11-22 DIAGNOSIS — Z2989 Need for prophylactic immunotherapy: Secondary | ICD-10-CM

## 2019-11-22 DIAGNOSIS — N051 Unspecified nephritic syndrome with focal and segmental glomerular lesions: Secondary | ICD-10-CM

## 2019-11-22 DIAGNOSIS — E7800 Pure hypercholesterolemia, unspecified: Secondary | ICD-10-CM

## 2019-11-22 DIAGNOSIS — E875 Hyperkalemia: Secondary | ICD-10-CM

## 2019-11-22 DIAGNOSIS — J101 Influenza due to other identified influenza virus with other respiratory manifestations: Secondary | ICD-10-CM

## 2019-11-22 DIAGNOSIS — D649 Anemia, unspecified: Secondary | ICD-10-CM

## 2019-11-22 DIAGNOSIS — Z940 Kidney transplant status: Secondary | ICD-10-CM

## 2019-11-22 DIAGNOSIS — N186 End stage renal disease: Secondary | ICD-10-CM

## 2019-11-22 DIAGNOSIS — T827XXA Infection and inflammatory reaction due to other cardiac and vascular devices, implants and grafts, initial encounter: Secondary | ICD-10-CM

## 2019-11-22 DIAGNOSIS — Z4822 Encounter for aftercare following kidney transplant: Secondary | ICD-10-CM

## 2019-11-22 DIAGNOSIS — N2581 Secondary hyperparathyroidism of renal origin: Secondary | ICD-10-CM

## 2019-11-22 DIAGNOSIS — E213 Hyperparathyroidism, unspecified: Secondary | ICD-10-CM

## 2019-11-22 DIAGNOSIS — R197 Diarrhea, unspecified: Secondary | ICD-10-CM

## 2019-11-22 DIAGNOSIS — K7460 Unspecified cirrhosis of liver: Secondary | ICD-10-CM

## 2019-11-22 DIAGNOSIS — Z006 Encounter for examination for normal comparison and control in clinical research program: Secondary | ICD-10-CM

## 2019-11-22 DIAGNOSIS — Z79899 Other long term (current) drug therapy: Secondary | ICD-10-CM

## 2019-11-22 DIAGNOSIS — I712 Thoracic aortic aneurysm (HC Code): Secondary | ICD-10-CM

## 2019-11-22 DIAGNOSIS — Z5181 Encounter for therapeutic drug level monitoring: Secondary | ICD-10-CM

## 2019-11-22 DIAGNOSIS — G4733 Obstructive sleep apnea (adult) (pediatric): Secondary | ICD-10-CM

## 2019-11-22 DIAGNOSIS — I770 Arteriovenous fistula, acquired: Secondary | ICD-10-CM

## 2019-11-22 LAB — CBC WITH AUTO DIFFERENTIAL
BKR CO2: 33.3 g/dL (ref 31.0–36.0)
BKR GLUCOSE: 184 x1000/??L (ref 140–440)
BKR WAM ABSOLUTE IMMATURE GRANULOCYTES: 0 x 1000/??L (ref 0.0–0.3)
BKR WAM ABSOLUTE LYMPHOCYTE COUNT: 0 x 1000/ÂµL — ABNORMAL LOW (ref 1.0–4.0)
BKR WAM ABSOLUTE NRBC: 0 x 1000/??L (ref 0.0–0.0)
BKR WAM ANALYZER ANC: 5.7 x 1000/ÂµL (ref 1.0–11.0)
BKR WAM BASOPHIL ABSOLUTE COUNT: 0 x 1000/??L (ref 0.0–0.0)
BKR WAM BASOPHILS: 0.3 % (ref 0.0–4.0)
BKR WAM EOSINOPHIL ABSOLUTE COUNT: 0 x 1000/??L (ref 0.0–1.0)
BKR WAM EOSINOPHILS: 0.2 % (ref 0.0–7.0)
BKR WAM HEMATOCRIT: 26.7 % — ABNORMAL LOW (ref 37.0–52.0)
BKR WAM HEMOGLOBIN: 8.9 g/dL — ABNORMAL LOW (ref 12.0–18.0)
BKR WAM IMMATURE GRANULOCYTES: 0.3 % (ref 0.0–3.0)
BKR WAM LYMPHOCYTES: 0.5 % — ABNORMAL LOW (ref 8.0–49.0)
BKR WAM MCH (PG): 36.3 pg — ABNORMAL HIGH (ref 27.0–31.0)
BKR WAM MCHC: 33.3 g/dL (ref 31.0–36.0)
BKR WAM MCV: 109 fL — ABNORMAL HIGH (ref 78.0–94.0)
BKR WAM MONOCYTE ABSOLUTE COUNT: 0.2 x 1000/??L (ref 0.0–2.0)
BKR WAM MONOCYTES: 3.8 % — ABNORMAL LOW (ref 4.0–15.0)
BKR WAM MPV: 9.5 fL — ABNORMAL HIGH (ref 6.0–11.0)
BKR WAM NEUTROPHILS: 94.9 % — ABNORMAL HIGH (ref 37.0–84.0)
BKR WAM NUCLEATED RED BLOOD CELLS: 0 % (ref 0.0–1.0)
BKR WAM PLATELETS: 184 x1000/ÂµL (ref 140–440)
BKR WAM RDW-CV: 13.2 % (ref 11.5–14.5)
BKR WAM RED BLOOD CELL COUNT: 2.5 M/??L — ABNORMAL LOW (ref 3.8–5.9)
BKR WAM WHITE BLOOD CELL COUNT: 6 x1000/??L (ref 4.0–10.0)

## 2019-11-22 LAB — BASIC METABOLIC PANEL
BKR ANION GAP: 16 % (ref 7–17)
BKR BLOOD UREA NITROGEN: 46 mg/dL — ABNORMAL HIGH (ref 6–20)
BKR BUN / CREAT RATIO: 11.6 % — ABNORMAL LOW (ref 8.0–23.0)
BKR CALCIUM: 8.9 mg/dL — ABNORMAL HIGH (ref 8.8–10.2)
BKR CHLORIDE: 100 mmol/L — ABNORMAL HIGH (ref 98–107)
BKR CREATININE: 3.95 mg/dL — ABNORMAL HIGH (ref 0.40–1.30)
BKR EGFR (AFR AMER): 19 mL/min/{1.73_m2} — ABNORMAL LOW (ref >60)
BKR EGFR (NON AFRICAN AMERICAN): 16 mL/min/1.73m2 — ABNORMAL LOW (ref >60)
BKR POTASSIUM: 4 mmol/L — ABNORMAL LOW (ref 3.3–5.1)
BKR SODIUM: 140 mmol/L (ref 136–144)

## 2019-11-22 LAB — ALBUMIN/CREATININE PANEL, URINE, RANDOM
BKR ALBUMIN, URINE, RANDOM: 78.5 mg/L — ABNORMAL LOW (ref 27.0–31.0)
BKR ALBUMIN/CREATININE RATIO, URINE, RANDOM: 66.4 mg/g Cr — ABNORMAL HIGH (ref <30.0)
BKR CREATININE, URINE, RANDOM: 118 mg/dL

## 2019-11-22 LAB — MYCOPHENOLIC ACID (BH GH YH): BKR MYCOPHENOLIC ACID: 5.7 ug/mL — ABNORMAL HIGH (ref 1.00–4.00)

## 2019-11-22 LAB — PHOSPHORUS     (BH GH L LMW YH): BKR PHOSPHORUS: 4.5 mg/dL (ref 2.2–4.5)

## 2019-11-22 LAB — PROTEIN, TOTAL W/CREATININE, URINE, RANDOM     (BH GH LMW YH)
BKR CREATININE, URINE, RANDOM: 118 mg/dL
BKR PROTEIN URINE RANDOM: 0.2 g/L
BKR PROTEIN/CREATININE RATIO, URINE, RANDOM: 0.17 mg/mg{creat} — ABNORMAL HIGH (ref <0.10)

## 2019-11-22 LAB — MAGNESIUM: BKR MAGNESIUM: 1.7 mg/dL (ref 1.7–2.4)

## 2019-11-22 LAB — ACTX PHARMACOGENOMICS ASSESSMENT

## 2019-11-22 MED ORDER — SODIUM ZIRCONIUM CYCLOSILICATE 10 GRAM ORAL POWDER PACKET
10 gram | Freq: Every day | ORAL | 12 refills | 30.00 days | Status: AC
Start: 2019-11-22 — End: 2019-12-07
  Filled 2019-11-21: qty 6, 2d supply, fill #0
  Filled 2019-11-21: qty 15, 2d supply, fill #1
  Filled 2019-11-24: qty 30, 30d supply, fill #1
  Filled 2019-11-24: qty 30, 30d supply, fill #0

## 2019-11-22 NOTE — Progress Notes
Labs reviewed with transplant nephrologist/surgeon Dr. Lucianne Muss, no changes were made.Electronically Signed by [place] Omer Puccinelli, RN, November 22, 2019

## 2019-11-22 NOTE — Progress Notes
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 Bactrim switched to AtovaquoneLast updated : 11/20/2019  JMHistory of Present Illness:Adam Harper comes to the transplant clinic today for a routine clinic visit. Overall patient is doing very well and denies any specific complains. He was discharged two days back after being admitted for Hyperkalemia (dietary non compliance). He denies any complains today. Has a list of foods which is high in potassium and following that diet. Home BP are in 120's range. PFSH reviewed and confirmed 11/22/2019 Review of SystemsIn addition, following review of systems was conducted : Patient denies any fevers or chills, no nausea or vomiting. No chest pain/ chest tightness, no cough/sputum or shortness of breath. No headaches, neck stiffness, no vision changes, no ear or nose discharge. No oral lesions. No abdominal pain, No constipation or diarrhea. No urinary complains like dysuria, hematuria, cloudy urine, reduced output. No skin rashes, no joint effusions. No significant weight gain or weight loss. No recent hospitalization, major illness. No recent h/o cancer. Physical Exam  Vitals: BP 125/73 (Site: l a, Position: Sitting, Cuff Size: Large)  - Pulse 65  - Temp (!) 96.6 ?F (35.9 ?C) (Temporal)  - Wt 112.5 kg  - SpO2 99%  - BMI 36.62 kg/m? Wt Readings from Last 4 Encounters: 11/22/19 112.5 kg 11/20/19 111.5 kg 11/15/19 115.8 kg 11/12/19 117 kg GENERAL : Appears well, not in distress, appropriately conversant. HEENT : EOMI , mucous membranes moist RESPIRATORY : NVBS heard bilaterally, no adventitious breath sounds heard. CARDIOVASCULAR : S1 S2 + , no added sounds heard. ABDOMEN : Soft, non-tender, incision is healing well EXTREMITIES : No edemaINTEGUMENT : No rashes on limited skin examLabs:Lab Results Component Value Date  CREATININE 3.95 (H) 11/22/2019  BUN 46 (H) 11/22/2019  NA 140 11/22/2019  K 4.0 11/22/2019  CL 100 11/22/2019  CO2 24 11/22/2019  Lab Results Component Value Date  WBC 6.0 11/22/2019  HGB 8.9 (L) 11/22/2019  HCT 26.7 (L) 11/22/2019  MCV 109.0 (H) 11/22/2019  PLT 184 11/22/2019 Lab Results Component Value Date  TACROLIMUS 7.7 11/20/2019 Most recent BK viral testing -- No results found for: BKVPCR, BKVPCRPMost recent CMV viral testing -- No results found for: CMVQPCRP, CMVQNPCRImmunosuppression     Start End  mycophenolate mofetil (CELLCEPT) 250 mg capsule 11/20/2019   Sig - Route: Take 4 capsules (1,000 mg total) by mouth 2 (two) times daily. - Oral  Notes to Pharmacy: Kidney Transplant Z94.0  predniSONE (DELTASONE) 5 mg tablet 11/10/2019   Sig - Route: Take 2 tablets (10 mg total) by mouth daily. Take with food. - Oral  Notes to Pharmacy: Kidney Transplant Z94.0  tacrolimus (PROGRAF) 1 mg immediate release capsule 11/20/2019 11/19/2020  Sig - Route: Take 8 capsules (8 mg total) by mouth 2 (two) times daily. - Oral  Notes to Pharmacy: Kidney Transplant Z94.0  Antihypertensives     Start End  amLODIPine (NORVASC) 10 mg tablet 11/12/2019 11/11/2020  Sig - Route: Take 1 tablet (10 mg total) by mouth every morning. - Oral  carvediloL (COREG) 25 mg Immediate Release tablet 11/12/2019 11/11/2020  Sig - Route: Take 1 tablet (25 mg total) by mouth 2 (two) times  daily with breakfast and dinner. - Oral  furosemide (LASIX) 40 mg tablet 11/20/2019 12/20/2019  Sig - Route: Take 1 tablet (40

## 2019-11-23 DIAGNOSIS — I10 Essential (primary) hypertension: Secondary | ICD-10-CM

## 2019-11-23 DIAGNOSIS — Z298 Encounter for other specified prophylactic measures: Secondary | ICD-10-CM

## 2019-11-23 LAB — BLOOD CULTURE
BKR BLOOD CULTURE: NO GROWTH
BKR BLOOD CULTURE: NO GROWTH

## 2019-11-23 LAB — CYTOMEGALOVIRUS QUANTITATIVE PCR, PLASMA     (BH GH L LMW YH): BKR CMV QUANTITATIVE PCR: NOT DETECTED

## 2019-11-25 ENCOUNTER — Encounter: Admit: 2019-11-25 | Payer: PRIVATE HEALTH INSURANCE | Primary: Family Medicine

## 2019-11-25 DIAGNOSIS — Z2989 Need for prophylactic immunotherapy: Secondary | ICD-10-CM

## 2019-11-25 DIAGNOSIS — Z940 Kidney transplant status: Secondary | ICD-10-CM

## 2019-11-25 NOTE — Progress Notes
[facility]   Transplant Surgery Progress NoteAttending Provider: Dawayne Patricia, MDAdmit Date: 9/4/2021Hospital Day: 3  s/p Code status: Full Code/ACLSAllergy: Patient has no known allergies.Subjective: Patient reports he has had no diarrhea. No chest pain. He states that he is very comfortable. He slept well overnight with his CPAP machine.Objective: Temp:  [97.1 ???F (36.2 ???C)-98 ???F (36.7 ???C)] 97.1 ???F (36.2 ???C)Pulse:  [57-91] 69Resp:  [13-27] 18BP: (109-142)/(57-92) 142/75SpO2:  [93 %-100 %] 98 %Device (Oxygen Therapy): room airI/O last 3 completed shifts:In: 2160 [P.O.:960; I.V.:1200]Out: 2450 [Urine:2050; Other:400]CBCLab Results Component Value Date  WBC 6.5 11/18/2019  HGB 10.3 (L) 11/18/2019  HCT 30.3 (L) 11/18/2019  PLT 257 11/18/2019 LYTESLab Results Component Value Date  NA 137 11/19/2019  K 5.2 (H) 11/19/2019  CL 100 11/19/2019  CO2 22 11/19/2019  BUN 48 (H) 11/19/2019  CREATININE 4.54 (H) 11/19/2019  GLU 101 (H) 11/19/2019  CALCIUM 9.1 11/19/2019  MG 1.7 11/18/2019  PHOS 3.9 11/18/2019 LFTsLab Results Component Value Date  BILITOT 0.6 11/18/2019  BILIDIR <0.2 11/17/2019  AST 15 11/18/2019  ALT 16 11/18/2019  ALKPHOS 65 11/18/2019  LIPASE 46 11/17/2019 PT/INR/PTTLab Results Component Value Date  INR 1.06 11/05/2019  PTT 26.0 11/05/2019 Microbiology:N/ADiagnostics:No results found.PHYSICAL EXAMGeneral: Comfortable appearingPulm: Breathing comfortably with CPAP in place, CTABCV: Regular rate and rhythmAbdomen is soft, nontender, nondistended, surgical site is well approximated with staples, minimal erythema at the medial superior aspect likely representing irritation around 4 staplesExtremities warm and well perfusedAssessment Patient is a 59 y.o. 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 He is recovering as expected with hyperkalemia again after dialysis.Plan ???	Transfer to floor???	CV: continue home meds norvasc, lipitor, coreg???	Renal diet???	Wound care: monitor???	GU: freely voiding. Continue to measure.???	No drains???	Restart tac 8/8, Cellcept 750mg  BID, prednisone 10mg  QD???	Renal Diet???	Bowel regimen???	DVT prophylaxis with heparin???	Appreciate Nephrology recommendations - Lasix 40mg  BID, Lokelma 10g BID???	Continue Atovaquone for toxoplasmosis prophylaxis (donor positive) given hyperkalemia will not use Bactrim. Discussed with ID, Pharmacy, Transplant Nephrology, and Transplant Surgery???	Home meds: reviewed Notifications : For questions, please page: Transplant SurgeryElectronically Signed ZO:XWRUE Shirleen Schirmer, MD9/08/2019, 9:24 AM Attending Addendum:

## 2019-11-26 ENCOUNTER — Ambulatory Visit: Admit: 2019-11-26 | Payer: PRIVATE HEALTH INSURANCE | Attending: Internal Medicine | Primary: Family Medicine

## 2019-11-26 ENCOUNTER — Telehealth: Admit: 2019-11-26 | Payer: PRIVATE HEALTH INSURANCE | Primary: Family Medicine

## 2019-11-26 ENCOUNTER — Encounter: Admit: 2019-11-26 | Payer: PRIVATE HEALTH INSURANCE | Attending: Pharmacotherapy | Primary: Family Medicine

## 2019-11-26 ENCOUNTER — Inpatient Hospital Stay: Admit: 2019-11-26 | Discharge: 2019-11-26 | Payer: PRIVATE HEALTH INSURANCE | Primary: Family Medicine

## 2019-11-26 ENCOUNTER — Ambulatory Visit: Admit: 2019-11-26 | Payer: PRIVATE HEALTH INSURANCE | Primary: Family Medicine

## 2019-11-26 DIAGNOSIS — Z940 Kidney transplant status: Secondary | ICD-10-CM

## 2019-11-26 DIAGNOSIS — R197 Diarrhea, unspecified: Secondary | ICD-10-CM

## 2019-11-26 DIAGNOSIS — Z79899 Other long term (current) drug therapy: Secondary | ICD-10-CM

## 2019-11-26 DIAGNOSIS — Z298 Encounter for other specified prophylactic measures: Secondary | ICD-10-CM

## 2019-11-26 DIAGNOSIS — Z2989 Need for prophylactic immunotherapy: Secondary | ICD-10-CM

## 2019-11-26 DIAGNOSIS — I10 Essential (primary) hypertension: Secondary | ICD-10-CM

## 2019-11-26 LAB — CBC WITH AUTO DIFFERENTIAL
BKR WAM ABSOLUTE IMMATURE GRANULOCYTES: 0 x 1000/??L (ref 0.0–0.3)
BKR WAM ABSOLUTE LYMPHOCYTE COUNT: 0 x 1000/??L — ABNORMAL LOW (ref 1.0–4.0)
BKR WAM ABSOLUTE NRBC: 0 x 1000/??L (ref 0.0–0.0)
BKR WAM ANALYZER ANC: 4 x 1000/ÂµL (ref 1.0–11.0)
BKR WAM BASOPHIL ABSOLUTE COUNT: 0 x 1000/??L (ref 0.0–0.0)
BKR WAM BASOPHILS: 0.5 % (ref 0.0–4.0)
BKR WAM EOSINOPHIL ABSOLUTE COUNT: 0 x 1000/??L (ref 0.0–1.0)
BKR WAM EOSINOPHILS: 0.2 % (ref 0.0–7.0)
BKR WAM HEMATOCRIT: 28.3 % — ABNORMAL LOW (ref 37.0–52.0)
BKR WAM HEMOGLOBIN: 9.2 g/dL — ABNORMAL LOW (ref 12.0–18.0)
BKR WAM IMMATURE GRANULOCYTES: 0.5 % (ref 0.0–3.0)
BKR WAM LYMPHOCYTES: 0.5 % — ABNORMAL LOW (ref 8.0–49.0)
BKR WAM MCH (PG): 34.7 pg — ABNORMAL HIGH (ref 27.0–31.0)
BKR WAM MCHC: 32.5 g/dL (ref 31.0–36.0)
BKR WAM MCV: 106.8 fL — ABNORMAL HIGH (ref 78.0–94.0)
BKR WAM MONOCYTE ABSOLUTE COUNT: 0.2 x 1000/ÂµL (ref 0.0–2.0)
BKR WAM MONOCYTES: 4.9 % (ref 4.0–15.0)
BKR WAM MPV: 9.7 fL (ref 6.0–11.0)
BKR WAM NEUTROPHILS: 93.4 % — ABNORMAL HIGH (ref 37.0–84.0)
BKR WAM NUCLEATED RED BLOOD CELLS: 0 % (ref 0.0–1.0)
BKR WAM PLATELETS: 167 x1000/ÂµL (ref 140–440)
BKR WAM RDW-CV: 13.2 % (ref 11.5–14.5)
BKR WAM RED BLOOD CELL COUNT: 2.7 M/??L — ABNORMAL LOW (ref 3.8–5.9)
BKR WAM WHITE BLOOD CELL COUNT: 4.3 x1000/ÂµL (ref 4.0–10.0)

## 2019-11-26 LAB — STOOL PATHOGENS BY PCR (BH GH LMW YH)
BKR CAMPYLOBACTER PCR: NOT DETECTED
BKR SALMONELLA PCR: NOT DETECTED
BKR SHIGA TOXIN 1 PCR: NOT DETECTED
BKR SHIGA TOXIN 2 PCR: NOT DETECTED
BKR SHIGELLA PCR: NOT DETECTED
BKR VIBRIO PCR: NOT DETECTED
BKR YERSINIA PCR: NOT DETECTED

## 2019-11-26 LAB — BASIC METABOLIC PANEL
BKR ANION GAP: 14 (ref 7–17)
BKR BLOOD UREA NITROGEN: 47 mg/dL — ABNORMAL HIGH (ref 6–20)
BKR BUN / CREAT RATIO: 13.9 % — ABNORMAL LOW (ref 8.0–23.0)
BKR CALCIUM: 9.2 mg/dL (ref 8.8–10.2)
BKR CHLORIDE: 101 mmol/L — ABNORMAL HIGH (ref 98–107)
BKR CO2: 25 mmol/L (ref 20–30)
BKR EGFR (AFR AMER): 23 mL/min/{1.73_m2} (ref >60)
BKR EGFR (NON AFRICAN AMERICAN): 19 mL/min/1.73m2 (ref >60)
BKR GLUCOSE: 105 mg/dL — ABNORMAL HIGH (ref 70–100)
BKR POTASSIUM: 4 mmol/L (ref 3.3–5.1)
BKR SODIUM: 140 mmol/L — ABNORMAL LOW (ref 136–144)

## 2019-11-26 LAB — MAGNESIUM: BKR MAGNESIUM: 1.4 mg/dL — ABNORMAL LOW (ref 1.7–2.4)

## 2019-11-26 LAB — PHOSPHORUS     (BH GH L LMW YH): BKR PHOSPHORUS: 3.9 mg/dL (ref 2.2–4.5)

## 2019-11-26 LAB — TACROLIMUS LEVEL     (BH GH L LMW YH): BKR TACROLIMUS BLOOD: 7.6 ng/mL

## 2019-11-26 MED ORDER — VALGANCICLOVIR 450 MG TABLET
450 mg | ORAL_TABLET | ORAL | 3 refills | 28.00 days | Status: AC
Start: 2019-11-26 — End: 2019-12-14
  Filled 2019-11-26: qty 12, 28d supply, fill #1
  Filled 2019-11-26: qty 12, 28d supply, fill #0

## 2019-11-26 NOTE — Telephone Encounter
Prograf Dose Change:Per Dr. Lucianne Muss Tacrolimus dose increased to 9 mg BID. I called [name] with dose change instructions. Unable to speak with pt. Left a VM Electronically Signed by Ivar Drape, RN, November 26, 2019

## 2019-11-26 NOTE — Progress Notes
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 Bactrim switched to Atovaquone    Last updated : 11/20/2019  JM      History of Present Illness:    Adam Harper comes to the transplant clinic today for a routine clinic visit. Overall patient is doing very well and denies any specific complains.     Home BP are in 120's to 130's range.     PFSH reviewed and confirmed 11/26/2019     Review of Systems    In addition, following review of systems was conducted :     Patient denies any fevers or chills, no nausea or vomiting. No chest pain/ chest tightness, no cough/sputum or shortness of breath. No headaches, neck stiffness, no vision changes, no ear or nose discharge. No oral lesions. No abdominal pain, No constipation or diarrhea. No urinary complains like dysuria, hematuria, cloudy urine, reduced output. No skin rashes, no joint effusions. No significant weight gain or weight loss. No recent hospitalization, major illness. No recent h/o cancer.     Physical Exam      Vitals: BP 138/76  - Pulse 71  - Temp 97.5 ?F (36.4 ?C) (Temporal)  - Ht 5' 8 (1.727 m)  - Wt 110.5 kg  - SpO2 100%  - BMI 37.04 kg/m?   Wt Readings from Last 4 Encounters:   11/26/19 110.5 kg   11/22/19 112.5 kg   11/20/19 111.5 kg   11/15/19 115.8 kg     GENERAL : Appears well  HEENT : EOMI , mucous membranes moist   RESPIRATORY : NVBS heard bilaterally  CARDIOVASCULAR : S1 S2 + , no added sounds heard.   ABDOMEN : Soft, non-tender, mild discharge from the superior aspect of the incision.   EXTREMITIES : No edema  INTEGUMENT : No rashes on limited skin exam    Labs:  Lab Results   Component Value Date    CREATININE 3.95 (H) 11/22/2019    BUN 46 (H) 11/22/2019    NA 140 11/22/2019    K 4.0 11/22/2019    CL 100 11/22/2019    CO2 24 11/22/2019      Lab Results   Component Value Date    WBC 6.0 11/22/2019    HGB 8.9 (L) 11/22/2019    HCT 26.7 (L) 11/22/2019    MCV 109.0 (H) 11/22/2019    PLT 184 11/22/2019     Lab Results   Component Value Date    TACROLIMUS 9.9 11/22/2019     Most recent BK viral testing -- No results found for: BKVPCR, BKVPCRP  Most recent CMV viral testing --   Cytomegalovirus Quantitative PCR (no units)   Date Value   11/22/2019 Not Detected     Immunosuppression       Start End    mycophenolate mofetil (CELLCEPT) 250 mg capsule 11/20/2019     Sig - Route: Take 4 capsules (1,000 mg total) by mouth 2 (  two) times daily. - Oral    Notes to Pharmacy: Kidney Transplant Z94.0    predniSONE (DELTASONE) 5 mg tablet 11/10/2019     Sig - Route: Take 2 tablets (10 mg total) by mouth daily. Take with food. - Oral    Notes to Pharmacy: Kidney Transplant Z94.0    tacrolimus (PROGRAF) 1 mg immediate release capsule 11/20/2019 11/19/2020    Sig - Route: Take 8 capsules (8 mg total) by mouth 2 (two) times daily. - Oral    Notes to Pharmacy: Kidney Transplant Z94.0        Antihypertensives       Start End    amLODIPine (NORVASC) 10 mg tablet 11/12/2019 11/11/2020    Sig - Route: Take 1 tablet (10 mg total) by mouth every morning. - Oral    carvediloL (COREG) 25 mg Immediate Release tablet 11/12/2019 11/11/2020    Sig - Route: Take 1 tablet (25 mg total) by mouth 2 (two) times daily with breakfast and dinner. - Oral    furosemide (LASIX) 40 mg tablet 11/20/2019 12/20/2019    Sig - Route: Take 1 tablet (40 mg total) by mouth 2 (two) times daily. - Oral        Transplant Prophylaxis             atovaquone (MEPRON) 750 mg/5 mL suspension Take 10 mLs (1,500 mg total) by mouth daily with dinner.    Number of times this order has been changed since signing: 8     Order Audit Trail     fluconazole (DIFLUCAN) 100 mg tablet Take 1 tablet (100 mg total) by mouth daily.    Number of times this order has been changed since signing: 10     Order Audit Trail     valGANciclovir (VALCYTE) 450 mg tablet Take 1 tablet (450 mg total) by mouth Every Monday and Thursday.    Number of times this order has been changed since signing: 9     Order Audit Trail     ciprofloxacin HCl (CIPRO) 500 mg tablet (Discontinued) Take 1 tablet (500 mg total) by mouth 2 (two) times daily. 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    - anemia: Hb of 9.2   - volume: euvolemic    Immunosuppression: tacrolimus 8/8, MMF 1000mg  bid, prednisone 10mg /day   - tacrolimus drug level 7.6   - increase tac to 9 mg bid. (goal ~10)    Hypertension: goal blood pressure <130/80, choice of antihypertensives ( dihydropyridine calcium channel blockers - may help with regression or lack of progression of LVH and ACEI's - helps with proteinuria and LVH)   - currently on amlodipine 10mg /day, lasix 20mg   bid and coreg 25mg  bid   - avoid salt, more fruits and vegetables in diet, regular exercise, avoid gaining weight   - No changes to the current antihypertensive regimen    Infection Prophylaxis:    - on fluconazole 100mg /day, valcyte 450mg  twice a week and atovaquonone     RTC in one week.         Kathaleen Maser, MD, FASN  Assistant Professor of Medicine  Transplant nephrologist   Section of Nephrology  Department of Internal Medicine   [facility]  [phone]  Tel: [phone]

## 2019-11-26 NOTE — Other
TRANSPLANT PHARMACY:  Valganciclovir Dose AdjustmentLuis A Harper has been referred for Valganciclovir management.Adam Harper is a 59 y.o. male who is s/p DDKT on 11/05/2019.Patient is moderate risk for CMV infection and his CrCl is 29.0 mL/min based on most recent serum creatinine. Patient's current valganciclovir regimen is 450 mg M/Th. Per protocol, recommend change to 405 mg MWF. Dose adjustment communicated to patient and medication list updated to reflect dose change.Adam Cyphers SgambotiSeptember 13, 20212:57 PM

## 2019-11-26 NOTE — Progress Notes
MD unavailable, verbal obtained from Dr. Gwendalyn Ege orders placed.

## 2019-11-27 ENCOUNTER — Telehealth: Admit: 2019-11-27 | Payer: PRIVATE HEALTH INSURANCE | Primary: Family Medicine

## 2019-11-27 LAB — ADENOVIRUS BY PCR     (LMW YH): BKR ADENOVIRUS BY RT-PCR: NOT DETECTED

## 2019-11-27 LAB — C. DIFFICILE ASSAY
BKR C. DIFFICILE GDH ANTIGEN: NEGATIVE
BKR C. DIFFICILE RAPID TOXIN: NEGATIVE

## 2019-11-27 LAB — NOROVIRUS BY RT-PCR     (BH GH LMW YH): BKR NOROVIRUS BY RT-PCR: NOT DETECTED

## 2019-11-27 NOTE — ED Provider Notes
Constitutional:     General: He is not in acute distress.   Appearance: Normal appearance. He is obese. He is not toxic-appearing. HENT:    Head: Normocephalic and atraumatic.    Nose: Nose normal. No congestion or rhinorrhea. Eyes:    General: No scleral icterus.      Right eye: No discharge.       Left eye: No discharge.    Extraocular Movements: Extraocular movements intact.    Conjunctiva/sclera: Conjunctivae normal. Cardiovascular:    Rate and Rhythm: Normal rate and regular rhythm.    Pulses: Normal pulses.    Heart sounds: Normal heart sounds. No murmur heard. No friction rub. No gallop.  Pulmonary:    Effort: Pulmonary effort is normal. No respiratory distress.    Breath sounds: Normal breath sounds. No stridor. No wheezing. Abdominal:    General: There is no distension.    Palpations: Abdomen is soft.    Tenderness: There is no abdominal tenderness. There is no guarding or rebound. Musculoskeletal:       General: No swelling or deformity. Normal range of motion.    Cervical back: Normal range of motion and neck supple.    Right lower leg: No edema.    Left lower leg: No edema. Skin:   General: Skin is warm and dry.    Capillary Refill: Capillary refill takes less than 2 seconds.    Coloration: Skin is not jaundiced.    Comments: RLQ surgical site with staples c/d/i, no surrounding pain or erythema Neurological:    General: No focal deficit present.    Mental Status: He is alert and oriented to person, place, and time. Mental status is at baseline. Psychiatric:       Mood and Affect: Mood normal.       Behavior: Behavior normal.       Thought Content: Thought content normal.  ProceduresProceduresResident/APP ZOX:WRUEAVWU NOTE AND MDMHPI: Adam Harper is a 59 y.o. 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 Reports he was feeling well after his transplant however 3-4 days ago began to have watery diarrhea with associated chills, weakness, fatigue, malaise and decreased PO intake with associated nausea. Denies bloody stools, abdominal pain, vomiting, fevers, chest pain, SOB, cough, rhinorrhea. Reports compliance with meds (valacyclovir, tacro, prednisone, cipro, bactrim, norvasc/carvedilol). Denies known sick contacts.?Vital Signs: BP 135/76  - Pulse 68  - Temp 98.9 ?F (37.2 ?C) (Oral)  - Resp 16  - SpO2 98% Exam significant for: As above, well appearing male, surgical scar c/d/i, vitals stable, lungs CTABMDM/Ddx:59 yo M presenting with generalized malaise, diarrhea, chills concerning for underlying viral vs. bacterial infection, possible covid-19, possible gastroenteritis, patient on CMV ppx but considered. Possible graft rejection/stenosis of anastomosis, pyelonephritis/UTI also considered.Plan: basic labs including lipase/hepatic panel, renal US, EKG, CXR, serial monitoring and reassessmentED course: Labs significant for K 8.1, Cr 4.98, imaging pending. No current EKG changes however per nursing patient had one episode of bradycardia in the room to 39, plan for hyperkalemia tx with calcium gluconate, insulin/dextrose, lokelma and furosemide. 10:10 PMPer nephrology, plan for repeat BMP and tacro level. Plan for dialysis tonight, patient will be admitted to SICU with transplant team.?Disposition: SICUThis patient was presented to and discussed with Dr. Becky Sax and a treatment plan and disposition were collaboratively agreed upon.Sharlette Dense Laps, [place] EMERGENCY MEDICINE RESIDENT9/06/2019 5:27 PMED COURSEPatient Reevaluation: Attending Supervised: ResidentI saw and examined the patient. I agree with  the findings and plan of care as documented in the resident's note. Of note patient is a 59 year old male with history of kidney transplant 12 days ago with elevated creatinine postop as well as high velocities on his graft studies.  Here with weakness and some concerning symptoms for COVID.  Patient is immunocompromised as he is still on meds.  Pancultured.  COVID swab sent.  Chest x-ray and urine ordered.  Graft ultrasound ordered.  Labs returned with elevated potassium to 8.1.  No new EKG changes.  Patient did become bradycardic temporarily in ED.  Given treatment for hyper K. call placed to transplant surgery as well as MICU attending.  Await ICU bed where we will be able to dialyze him tonight.  Jasmine December A ChekijianClinical Impressions as of Nov 26 1204 AKI (acute kidney injury) [facility] Code) Hyperkalemia Diarrhea, unspecified type  ED DispositionAdmit Hollace Kinnier, MD09/04/21 2105 Crista Elliot, MDResident09/04/21 2130 Crista Elliot, MDResident09/04/21 2210 Hollace Kinnier, MD09/14/21 [phone]

## 2019-11-27 NOTE — Telephone Encounter
Post Renal TransplantStent Removal Reminder Call Adam Harper is a 59 y.o. received a  LEFT KIDNEY  transplant on 11/05/19 Adam Harper was contacted today to review plan for stent removal.  Urological Symptoms: noneProphylactic Stent Regimen:ciproPatient has been reminded to take their prophylactic antibiotics.  Patient has verbalized an understanding of the plan of care at this time.  Patient has been instructed to contact the transplant team [phone] with any new signs or symptoms. Spanish interpretor ID: [id number]

## 2019-11-28 ENCOUNTER — Encounter: Admit: 2019-11-28 | Payer: PRIVATE HEALTH INSURANCE | Attending: Urology | Primary: Family Medicine

## 2019-11-28 ENCOUNTER — Ambulatory Visit: Admit: 2019-11-28 | Payer: PRIVATE HEALTH INSURANCE | Attending: Urology | Primary: Family Medicine

## 2019-11-28 DIAGNOSIS — K219 Gastro-esophageal reflux disease without esophagitis: Secondary | ICD-10-CM

## 2019-11-28 DIAGNOSIS — E7800 Pure hypercholesterolemia, unspecified: Secondary | ICD-10-CM

## 2019-11-28 DIAGNOSIS — I712 Thoracic aortic aneurysm (HC Code): Secondary | ICD-10-CM

## 2019-11-28 DIAGNOSIS — J101 Influenza due to other identified influenza virus with other respiratory manifestations: Secondary | ICD-10-CM

## 2019-11-28 DIAGNOSIS — N2581 Secondary hyperparathyroidism of renal origin: Secondary | ICD-10-CM

## 2019-11-28 DIAGNOSIS — I770 Arteriovenous fistula, acquired: Secondary | ICD-10-CM

## 2019-11-28 DIAGNOSIS — E213 Hyperparathyroidism, unspecified: Secondary | ICD-10-CM

## 2019-11-28 DIAGNOSIS — N186 End stage renal disease: Secondary | ICD-10-CM

## 2019-11-28 DIAGNOSIS — D649 Anemia, unspecified: Secondary | ICD-10-CM

## 2019-11-28 DIAGNOSIS — T827XXA Infection and inflammatory reaction due to other cardiac and vascular devices, implants and grafts, initial encounter: Secondary | ICD-10-CM

## 2019-11-28 DIAGNOSIS — K7460 Unspecified cirrhosis of liver: Secondary | ICD-10-CM

## 2019-11-28 DIAGNOSIS — G4733 Obstructive sleep apnea (adult) (pediatric): Secondary | ICD-10-CM

## 2019-11-28 DIAGNOSIS — I10 Essential (primary) hypertension: Secondary | ICD-10-CM

## 2019-11-28 LAB — ROTAVIRUS ANTIGEN, STOOL     (GH L): BKR ROTAVIRUS AG, EIA: NEGATIVE

## 2019-11-28 MED ORDER — LIDOCAINE 2 % MUCOSAL JELLY IN APPLICATOR
2 % | Freq: Once | URETHRAL | Status: CP
Start: 2019-11-28 — End: ?
  Administered 2019-11-28: 12:00:00 2 mL via URETHRAL

## 2019-11-28 NOTE — Progress Notes
Time Out Documentation?	Procedure Name: cystoscopy and stent removal?	Procedure Time: 8:14 AM ?	Team Members Present: Ardeen Fillers, RN, Bernadene Person, MD?	Time Out initiated after patient positioned & prior to beginning of procedure: Yes?	Name of Patient  & MRUN # or DOB stated and matches ID band or previously confirmed med record number: Yes?	Proceduralist states or confirms procedure to be performed: Yes?	Procedural consent is used to verify procedure performed  & matches pt identifiers:Yes?	Site of procedure(s) (with laterality or level) is topically marked per policy & visible after draping:N/A ?	Final check completed on all specimens: not applicable?	Serial number on device: [id number]

## 2019-11-28 NOTE — Progress Notes
Adam Harper is here today for a cystoscopy and stent removal.  Patient informed of procedure and questions answered.  Instructions for pre procedure given to patient.  Written consent and time out obtained.  Patient prepped according to protocol. Lidocaine jelly given. Tolerated procedure well.  VSS.  Post procedure instructions reviewed with patient.Interpreter ID: [id number] Electronically Signed by Ardeen Fillers, RN, November 28, 2019

## 2019-11-28 NOTE — Progress Notes
daily. 30 tablet 0 ? furosemide (LASIX) 40 mg tablet Take 1 tablet (40 mg total) by mouth 2 (two) times daily. 30 tablet 1 ? mycophenolate mofetil (CELLCEPT) 250 mg capsule Take 4 capsules (1,000 mg total) by mouth 2 (two) times daily. 240 capsule 11 ? omeprazole (PRILOSEC) 20 mg capsule Take 1 capsule (20 mg total) by mouth daily. 90 capsule 1 ? predniSONE (DELTASONE) 5 mg tablet Take 2 tablets (10 mg total) by mouth daily. Take with food. 60 tablet 11 ? sodium zirconium cyclosilicate (LOKELMA) 10 gram oral powder packet Mix 1 packet (10 g total)  according to package directions and take by mouth daily 30 each 11 ? tacrolimus (PROGRAF) 1 mg immediate release capsule Take 8 capsules (8 mg total) by mouth 2 (two) times daily. (Patient taking differently: Take 9 mg by mouth 2 (two) times daily. ) 480 capsule 11 ? valGANciclovir (VALCYTE) 450 mg tablet Take 1 tablet (450 mg total) by mouth Every Monday, Wednesday, and Friday. 12 tablet 2 Facility-Administered Encounter Medications as of 11/28/2019 Medication Dose Route Frequency Provider Last Rate Last Admin ? lidocaine uro-jet (XYLOCAINE) 2 % jelly 11 mL  11 mL INTRA-URETHRAL Once Glen Blatchley, Daralene Milch., MD      Social HistorySocial History Tobacco Use ? Smoking status: Former Smoker   Packs/day: 0.25   Years: 25.00   Pack years: 6.25   Types: Cigarettes ? Smokeless tobacco: Never Used Vaping Use ? Vaping Use: Never used Substance Use Topics ? Alcohol use: No ? Drug use: No  Family History No family history on file.Physical ExamThere were no vitals taken for this visit.Constitutional: Oriented to person, place, and time. Appears well-developed and well-nourished. HENT: Head: Normocephalic and atraumatic. Eyes: Conjunctivae are normal. Neck: No tracheal deviation present. Pulmonary/Chest: Effort normal. Abdominal: Soft and non-tenderMusculoskeletal: Normal range of motion. Neurological: alert and oriented to person, place, and time. No focal deficits are evident. Skin: Skin is dry. Psychiatric: Has a normal mood and affect. Behavior is normal. Judgment and thought content normal. Lab Results Component Value Date  UGLUCOSE Negative 07/15/2010  UKETONE Negative 07/15/2010  USPECGRAVITY 1.025 07/15/2010  UPH 5.5 07/15/2010  UPROTEIN Positive 07/15/2010  UNITRATES Negative 07/15/2010  UBLOOD Positive 07/15/2010  ULEUKOCYTES Negative 07/15/2010   No results found for: PVRPOCTAfter time out patient had a flexible cystoscope inserted into the bladder without difficulty.  The stent was immediately seen and grasped with a forceps.  Stent removed atraumatically with no complications.Assessment & Plan:Adam Harper is a 59 y.o. male underwent uncomplicated stent removal. Encounter Diagnoses Name SNOMED [place](R) Primary? ? Kidney transplant recipient HISTORY OF RENAL TRANSPLANT Yes ? Kidney transplanted HISTORY OF RENAL TRANSPLANT  No orders of the defined types were placed in this encounter.No follow-ups on file.Patient will let us know if any new problems or symptoms arise in the Melvern Sample, MD

## 2019-11-28 NOTE — Patient Instructions
Cistoscopia, cuidados posteriores  Cystoscopy, Care After  Siga estas instrucciones durante las pr?ximas semanas. Estas indicaciones le proporcionan informaci?n acerca de c?mo deber? cuidarse despu?s del procedimiento. Su m?dico tambi?n podr? darle instrucciones m?s espec?ficas. El tratamiento ha sido planificado seg?n las pr?cticas m?dicas actuales, pero en algunos casos pueden ocurrir problemas. Comun?quese con el m?dico si tiene alg?n problema o dudas despu?s del procedimiento.  ?Qu? puedo esperar despu?s del procedimiento?  Despu?s del procedimiento, es com?n tener los siguientes s?ntomas:  ? Dolor leve al Geographical information systems officer. El dolor debe ceder unos minutos despu?s de Geographical information systems officer. Esto puede durar 1 semana.  ? Una peque?a cantidad de sangre en la orina durante varios d?as.  ? Sentir que Immunologist produce solo una peque?a cantidad [place].    Siga estas instrucciones en su casa:    Medicamentos  ? Tome los medicamentos de venta libre y los recetados solamente como se lo haya indicado el m?dico.  ? Si le recetaron un antibi?tico, t?melo como se lo haya indicado el m?dico. No deje de tomar los antibi?ticos aunque comience a sentirse mejor.  Instrucciones generales    ? Retome sus actividades normales como se lo haya indicado el m?dico. Preg?ntele al m?dico qu? actividades son seguras para usted.  ? No conduzca durante 24?horas si le administraron un sedante.  ? Observe si hay sangre en la orina. Si la cantidad de [employer] orina aumenta, comun?quese con el m?dico.  ? Siga las indicaciones del m?dico respecto de las restricciones para las comidas o las bebidas.  ? Si se tom? [place] de tejido para an?lisis (biopsia) durante el procedimiento, es su responsabilidad obtener los [place] de la prueba. Consulte a su m?dico o en el departamento donde se realice el estudio cu?ndo estar?n [employer].  ? Beba suficiente l?quido para mantener la orina clara o de color amarillo p?lido.  ? Concurra a todas las visitas de control como se lo haya indicado el m?dico. Esto es importante.  Comun?quese con un m?dico si:  ? Tiene un dolor que empeora o que no mejora con los medicamentos, especialmente al Geographical information systems officer.  ? Tiene dificultad para orinar.  Solicite ayuda de inmediato si:  ? Observa m?s sangre en la orina.  ? Hay co?gulos de [employer].  ? Siente dolor abdominal.  ? Tiene fiebre o siente escalofr?os.  ? No puede orinar.  Esta informaci?n no tiene Theme park manager el consejo del m?dico. Aseg?rese de hacerle al m?dico cualquier pregunta que tenga.  Document Released: 08/19/2009 Document Revised: 06/03/2016 Document Reviewed: 01/16/2015  Elsevier Interactive Patient Education ? 2019 Elsevier Inc.

## 2019-11-29 DIAGNOSIS — N186 End stage renal disease: Secondary | ICD-10-CM

## 2019-11-29 DIAGNOSIS — Z940 Kidney transplant status: Secondary | ICD-10-CM

## 2019-11-29 DIAGNOSIS — G4733 Obstructive sleep apnea (adult) (pediatric): Secondary | ICD-10-CM

## 2019-11-29 DIAGNOSIS — I120 Hypertensive chronic kidney disease with stage 5 chronic kidney disease or end stage renal disease: Secondary | ICD-10-CM

## 2019-11-29 DIAGNOSIS — K219 Gastro-esophageal reflux disease without esophagitis: Secondary | ICD-10-CM

## 2019-12-03 ENCOUNTER — Inpatient Hospital Stay: Admit: 2019-12-03 | Discharge: 2019-12-03 | Payer: PRIVATE HEALTH INSURANCE | Primary: Family Medicine

## 2019-12-03 ENCOUNTER — Encounter: Admit: 2019-12-03 | Payer: PRIVATE HEALTH INSURANCE | Attending: Nephrology | Primary: Family Medicine

## 2019-12-03 ENCOUNTER — Encounter: Admit: 2019-12-03 | Payer: PRIVATE HEALTH INSURANCE | Primary: Family Medicine

## 2019-12-03 DIAGNOSIS — Z298 Encounter for other specified prophylactic measures: Secondary | ICD-10-CM

## 2019-12-03 DIAGNOSIS — E213 Hyperparathyroidism, unspecified: Secondary | ICD-10-CM

## 2019-12-03 DIAGNOSIS — G4733 Obstructive sleep apnea (adult) (pediatric): Secondary | ICD-10-CM

## 2019-12-03 DIAGNOSIS — N186 End stage renal disease: Secondary | ICD-10-CM

## 2019-12-03 DIAGNOSIS — K219 Gastro-esophageal reflux disease without esophagitis: Secondary | ICD-10-CM

## 2019-12-03 DIAGNOSIS — Z940 Kidney transplant status: Secondary | ICD-10-CM

## 2019-12-03 DIAGNOSIS — I712 Thoracic aortic aneurysm (HC Code): Secondary | ICD-10-CM

## 2019-12-03 DIAGNOSIS — T827XXA Infection and inflammatory reaction due to other cardiac and vascular devices, implants and grafts, initial encounter: Secondary | ICD-10-CM

## 2019-12-03 DIAGNOSIS — D649 Anemia, unspecified: Secondary | ICD-10-CM

## 2019-12-03 DIAGNOSIS — N2581 Secondary hyperparathyroidism of renal origin: Secondary | ICD-10-CM

## 2019-12-03 DIAGNOSIS — Z2989 Need for prophylactic immunotherapy: Secondary | ICD-10-CM

## 2019-12-03 DIAGNOSIS — Z48298 Encounter for aftercare following other organ transplant: Secondary | ICD-10-CM

## 2019-12-03 DIAGNOSIS — I10 Essential (primary) hypertension: Secondary | ICD-10-CM

## 2019-12-03 DIAGNOSIS — K7460 Unspecified cirrhosis of liver: Secondary | ICD-10-CM

## 2019-12-03 DIAGNOSIS — E7800 Pure hypercholesterolemia, unspecified: Secondary | ICD-10-CM

## 2019-12-03 DIAGNOSIS — I770 Arteriovenous fistula, acquired: Secondary | ICD-10-CM

## 2019-12-03 DIAGNOSIS — J101 Influenza due to other identified influenza virus with other respiratory manifestations: Secondary | ICD-10-CM

## 2019-12-03 LAB — CBC WITH AUTO DIFFERENTIAL
BKR HEPATITIS B SURFACE ANTIGEN: 0 x 1000/??L (ref 0.0–0.3)
BKR WAM ABSOLUTE IMMATURE GRANULOCYTES: 0 x 1000/ÂµL (ref 0.0–0.3)
BKR WAM ABSOLUTE LYMPHOCYTE COUNT: 0 x 1000/ÂµL — ABNORMAL LOW (ref 1.0–4.0)
BKR WAM ABSOLUTE NRBC: 0 x 1000/ÂµL (ref 0.0–0.0)
BKR WAM ANALYZER ANC: 2.5 x 1000/ÂµL (ref 1.0–11.0)
BKR WAM BASOPHIL ABSOLUTE COUNT: 0 x 1000/ÂµL (ref 0.0–0.0)
BKR WAM BASOPHILS: 0.4 % (ref 0.0–4.0)
BKR WAM EOSINOPHIL ABSOLUTE COUNT: 0 x 1000/??L (ref 0.0–1.0)
BKR WAM EOSINOPHILS: 1.1 % (ref 0.0–7.0)
BKR WAM HEMATOCRIT: 29.4 % — ABNORMAL LOW (ref 37.0–52.0)
BKR WAM HEMOGLOBIN: 9.8 g/dL — ABNORMAL LOW (ref 12.0–18.0)
BKR WAM IMMATURE GRANULOCYTES: 0.4 % (ref 0.0–3.0)
BKR WAM LYMPHOCYTES: 1.4 % — ABNORMAL LOW (ref 8.0–49.0)
BKR WAM MCH (PG): 35.1 pg — ABNORMAL HIGH (ref 27.0–31.0)
BKR WAM MCHC: 33.3 g/dL (ref 31.0–36.0)
BKR WAM MCV: 105.4 fL — ABNORMAL HIGH (ref 78.0–94.0)
BKR WAM MONOCYTE ABSOLUTE COUNT: 0.2 x 1000/??L (ref 0.0–2.0)
BKR WAM MONOCYTES: 8.2 % (ref 4.0–15.0)
BKR WAM NEUTROPHILS: 88.5 % — ABNORMAL HIGH (ref 37.0–84.0)
BKR WAM NUCLEATED RED BLOOD CELLS: 0 % (ref 0.0–1.0)
BKR WAM PLATELETS: 261 x1000/??L — ABNORMAL HIGH (ref 140–440)
BKR WAM RDW-CV: 12.9 % (ref 11.5–14.5)
BKR WAM RED BLOOD CELL COUNT: 2.8 M/??L — ABNORMAL LOW (ref 3.8–5.9)
BKR WAM WHITE BLOOD CELL COUNT: 2.8 x1000/??L — ABNORMAL LOW (ref 4.0–10.0)

## 2019-12-03 LAB — BASIC METABOLIC PANEL
BKR ANION GAP: 17 % (ref 7–17)
BKR BLOOD UREA NITROGEN: 50 mg/dL — ABNORMAL HIGH (ref 6–20)
BKR BUN / CREAT RATIO: 15.6 % — ABNORMAL LOW (ref 8.0–23.0)
BKR CALCIUM: 8.3 mg/dL — ABNORMAL LOW (ref 8.8–10.2)
BKR CHLORIDE: 100 mmol/L — ABNORMAL HIGH (ref 98–107)
BKR CO2: 24 mmol/L (ref 20–30)
BKR CREATININE: 3.2 mg/dL — ABNORMAL HIGH (ref 0.40–1.30)
BKR EGFR (AFR AMER): 24 mL/min/{1.73_m2} — ABNORMAL LOW (ref >60)
BKR EGFR (NON AFRICAN AMERICAN): 20 mL/min/{1.73_m2} (ref >60)
BKR GLUCOSE: 102 mg/dL — ABNORMAL HIGH (ref 70–100)
BKR POTASSIUM: 3.3 mmol/L (ref 3.3–5.1)
BKR SODIUM: 141 mmol/L (ref 136–144)
BKR WAM MPV: 50 mg/dL — ABNORMAL HIGH (ref 6–20)

## 2019-12-03 LAB — PROTEIN, TOTAL W/CREATININE, URINE, RANDOM     (BH GH LMW YH)
BKR CREATININE, URINE, RANDOM: 125 mg/dL
BKR PROTEIN URINE RANDOM: 0.11 g/L
BKR PROTEIN/CREATININE RATIO, URINE, RANDOM: 0.08 mg/mg{creat} (ref <0.10)

## 2019-12-03 LAB — MAGNESIUM: BKR MAGNESIUM: 1.5 mg/dL — ABNORMAL LOW (ref 1.7–2.4)

## 2019-12-03 LAB — ALBUMIN/CREATININE PANEL, URINE, RANDOM
BKR ALBUMIN, URINE, RANDOM: 40.9 mg/L
BKR ALBUMIN/CREATININE RATIO, URINE, RANDOM: 32.8 mg/g Cr — ABNORMAL HIGH (ref <30.0)
BKR CREATININE, URINE, RANDOM: 125 mg/dL

## 2019-12-03 LAB — TACROLIMUS LEVEL     (BH GH L LMW YH): BKR TACROLIMUS BLOOD: 10.5 ng/mL

## 2019-12-03 LAB — PHOSPHORUS     (BH GH L LMW YH): BKR PHOSPHORUS: 3.9 mg/dL (ref 2.2–4.5)

## 2019-12-03 NOTE — Progress Notes
Reviewed labs with Dr. Drue Second. No changes to IS regimen.

## 2019-12-04 ENCOUNTER — Telehealth: Admit: 2019-12-04 | Payer: PRIVATE HEALTH INSURANCE | Attending: Cardiovascular Disease | Primary: Family Medicine

## 2019-12-04 NOTE — Telephone Encounter
Spoke with patient who denies any symptoms related to tachycardia. In no distress.

## 2019-12-04 NOTE — Telephone Encounter
Received a call from wife Byrd Hesselbach that [place] having tachycardia but she doesn't speak english and was not able to give me more informationI asked that [place] call back to speak with a nurse- I have spoken with him directly in the past and he speaks english

## 2019-12-05 NOTE — Telephone Encounter
Patient called back stating he was told to make an apt for Tachycardia Scheduled with Shanda Bumps next week

## 2019-12-06 ENCOUNTER — Inpatient Hospital Stay: Admit: 2019-12-06 | Discharge: 2019-12-06 | Payer: PRIVATE HEALTH INSURANCE | Primary: Family Medicine

## 2019-12-06 ENCOUNTER — Ambulatory Visit: Admit: 2019-12-06 | Payer: PRIVATE HEALTH INSURANCE | Attending: Internal Medicine | Primary: Family Medicine

## 2019-12-06 ENCOUNTER — Encounter: Admit: 2019-12-06 | Payer: PRIVATE HEALTH INSURANCE | Primary: Family Medicine

## 2019-12-06 DIAGNOSIS — Z79899 Other long term (current) drug therapy: Secondary | ICD-10-CM

## 2019-12-06 DIAGNOSIS — I10 Essential (primary) hypertension: Secondary | ICD-10-CM

## 2019-12-06 DIAGNOSIS — Z940 Kidney transplant status: Secondary | ICD-10-CM

## 2019-12-06 DIAGNOSIS — Z48298 Encounter for aftercare following other organ transplant: Secondary | ICD-10-CM

## 2019-12-06 DIAGNOSIS — Z298 Encounter for other specified prophylactic measures: Secondary | ICD-10-CM

## 2019-12-06 DIAGNOSIS — Z2989 Need for prophylactic immunotherapy: Secondary | ICD-10-CM

## 2019-12-06 LAB — BASIC METABOLIC PANEL
BKR ANION GAP: 16 (ref 7–17)
BKR BLOOD UREA NITROGEN: 47 mg/dL — ABNORMAL HIGH (ref 6–20)
BKR BUN / CREAT RATIO: 15 (ref 8.0–23.0)
BKR CALCIUM: 8.5 mg/dL — ABNORMAL LOW (ref 8.8–10.2)
BKR CHLORIDE: 101 mmol/L — ABNORMAL HIGH (ref 98–107)
BKR CO2: 24 mmol/L (ref 20–30)
BKR CREATININE: 3.14 mg/dL — ABNORMAL HIGH (ref 0.40–1.30)
BKR EGFR (AFR AMER): 25 mL/min/1.73m2 — ABNORMAL LOW (ref >60)
BKR EGFR (NON AFRICAN AMERICAN): 21 mL/min/{1.73_m2} (ref >60)
BKR GLUCOSE: 112 mg/dL — ABNORMAL HIGH (ref 70–100)
BKR POTASSIUM: 3.5 mmol/L (ref 3.3–5.1)
BKR SODIUM: 141 mmol/L (ref 136–144)

## 2019-12-06 LAB — CBC WITH AUTO DIFFERENTIAL
BKR WAM ABSOLUTE IMMATURE GRANULOCYTES: 0 x 1000/??L (ref 0.0–0.3)
BKR WAM ABSOLUTE LYMPHOCYTE COUNT: 0.1 x 1000/ÂµL — ABNORMAL LOW (ref 1.0–4.0)
BKR WAM ABSOLUTE NRBC: 0 x 1000/??L (ref 0.0–0.0)
BKR WAM ANALYZER ANC: 2.7 x 1000/ÂµL (ref 1.0–11.0)
BKR WAM BASOPHIL ABSOLUTE COUNT: 0 x 1000/??L (ref 0.0–0.0)
BKR WAM BASOPHILS: 0.6 % (ref 0.0–4.0)
BKR WAM EOSINOPHIL ABSOLUTE COUNT: 0 x 1000/??L (ref 0.0–1.0)
BKR WAM EOSINOPHILS: 0.6 % (ref 0.0–7.0)
BKR WAM HEMATOCRIT: 27.3 % — ABNORMAL LOW (ref 37.0–52.0)
BKR WAM HEMOGLOBIN: 9.3 g/dL — ABNORMAL LOW (ref 12.0–18.0)
BKR WAM IMMATURE GRANULOCYTES: 0.9 % (ref 0.0–3.0)
BKR WAM LYMPHOCYTES: 1.6 % — ABNORMAL LOW (ref 8.0–49.0)
BKR WAM MCH (PG): 35.1 pg — ABNORMAL HIGH (ref 27.0–31.0)
BKR WAM MCHC: 34.1 g/dL (ref 31.0–36.0)
BKR WAM MCV: 103 fL — ABNORMAL HIGH (ref 78.0–94.0)
BKR WAM MONOCYTE ABSOLUTE COUNT: 0.3 x 1000/??L (ref 0.0–2.0)
BKR WAM MONOCYTES: 10.4 % (ref 4.0–15.0)
BKR WAM MPV: 9.4 fL — ABNORMAL HIGH (ref 6.0–11.0)
BKR WAM NEUTROPHILS: 85.9 % — ABNORMAL HIGH (ref 37.0–84.0)
BKR WAM NUCLEATED RED BLOOD CELLS: 0 % (ref 0.0–1.0)
BKR WAM PLATELETS: 284 x1000/??L — ABNORMAL HIGH (ref 140–440)
BKR WAM RDW-CV: 13.2 % (ref 11.5–14.5)
BKR WAM RED BLOOD CELL COUNT: 2.7 M/ÂµL — ABNORMAL LOW (ref 3.8–5.9)
BKR WAM WHITE BLOOD CELL COUNT: 3.2 x1000/ÂµL — ABNORMAL LOW (ref 4.0–10.0)

## 2019-12-06 LAB — BK VIRUS BY PCR, QUANTITATIVE, PLASMA: BKR BK VIRUS, QUANTITATIVE PCR, PLASMA: NOT DETECTED

## 2019-12-06 LAB — PTH, INTACT WITHOUT CALCIUM: BKR PARATHYROID HORMONE INTACT: 37.5 pg/mL (ref 15.0–65.0)

## 2019-12-06 LAB — TACROLIMUS LEVEL     (BH GH L LMW YH): BKR TACROLIMUS BLOOD: 11.6 ng/mL

## 2019-12-06 LAB — MAGNESIUM: BKR MAGNESIUM: 1.4 mg/dL — ABNORMAL LOW (ref 1.7–2.4)

## 2019-12-06 LAB — PHOSPHORUS     (BH GH L LMW YH): BKR PHOSPHORUS: 3.9 mg/dL (ref 2.2–4.5)

## 2019-12-06 MED ORDER — TACROLIMUS IMMEDIATE RELEASE 1 MG CAPSULE
1 mg | ORAL_CAPSULE | Freq: Two times a day (BID) | ORAL | 12 refills | 30.00 days | Status: AC
Start: 2019-12-06 — End: 2020-01-03
  Filled 2019-12-10: qty 480, 30d supply, fill #0
  Filled 2019-12-10: qty 480, 30d supply, fill #1

## 2019-12-06 MED ORDER — ATOVAQUONE 750 MG/5 ML ORAL SUSPENSION
7505 mg/5 mL | ORAL | 7 refills | 21.00 days | Status: AC
Start: 2019-12-06 — End: 2020-06-16
  Filled 2019-12-10: qty 210, 21d supply, fill #7
  Filled 2019-12-10: qty 210, 21d supply, fill #0

## 2019-12-06 NOTE — Progress Notes
Reviewed labs with Dr. Kumar. No changes to IS regimen.

## 2019-12-07 LAB — HEPATITIS B VIRUS BY PCR, QUANT.     (BH GH LMW YH): BKR HEPATITIS B VIRUS, QUANT. PCR, SERUM: NOT DETECTED x 1000/??L (ref 0.0–0.0)

## 2019-12-07 LAB — HEPATITIS C VIRUS BY RT-PCR, QUANTITATIVE     (BH GH L LMW YH): BKR HEPATITIS C VIRUS, QUANT. PCR, SERUM: NOT DETECTED x 1000/??L (ref 0.0–0.0)

## 2019-12-07 LAB — HIV-1 RNA BY RT-PCR, QUANTITATIVE     (BH GH LMW Q YH): BKR HIV-1 QUANTITATIVE RT-PCR, PLASMA: NOT DETECTED

## 2019-12-07 NOTE — Progress Notes
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 Bactrim switched to Atovaquone    Last updated : 11/20/2019  JM      History of Present Illness:    Adam Harper comes to the transplant clinic today for a routine clinic visit. Overall patient is doing very well and denies any specific complains.     Home BP are in 120's range. He does complain of intermittent tachycardia (lasting only 1-2 min, few times a day). He is also tired on and off and also has pain at the graft site occasionally.     PFSH reviewed and confirmed 12/06/2019     Review of Systems    In addition, following review of systems was conducted :     Patient denies any fevers or chills, no nausea or vomiting. No chest pain/ chest tightness, no cough/sputum or shortness of breath. No headaches, neck stiffness, no vision changes, no ear or nose discharge. No oral lesions. No constipation or diarrhea. No urinary complains like dysuria, hematuria, cloudy urine, reduced output. No skin rashes, no joint effusions. No significant weight gain or weight loss. No recent hospitalization, major illness. No recent h/o cancer.     Physical Exam      Vitals: BP 132/65  - Pulse 70  - Temp 97 ?F (36.1 ?C) (Temporal)  - Ht 5' 9\ (1.753 m)  - Wt 111 kg  - SpO2 98%  - BMI 36.15 kg/m?   Wt Readings from Last 4 Encounters:   12/06/19 111 kg   12/03/19 110.9 kg   11/28/19 109.8 kg   11/26/19 110.5 kg     GENERAL : Appears well  HEENT : EOMI , mucous membranes moist   RESPIRATORY : NVBS heard bilaterally  CARDIOVASCULAR : S1 S2 + , no added sounds heard.   ABDOMEN : Soft, non-tender, superior aspect of the incision still healing.   EXTREMITIES : No edema  INTEGUMENT : No rashes on limited skin exam    Labs:  Lab Results   Component Value Date    CREATININE 3.14 (H) 12/06/2019    BUN 47 (H) 12/06/2019    NA 141 12/06/2019    K 3.5 12/06/2019    CL 101 12/06/2019    CO2 24 12/06/2019      Lab Results   Component Value Date    WBC 3.2 (L) 12/06/2019    HGB 9.3 (L) 12/06/2019    HCT 27.3 (L) 12/06/2019    MCV 103.0 (H) 12/06/2019    PLT 284 12/06/2019     Lab Results   Component Value Date    TACROLIMUS 10.5 12/03/2019     Most recent BK viral testing -- No results found for: BKVPCR, BKVPCRP  Most recent CMV viral testing --   Cytomegalovirus Quantitative PCR (no units)   Date Value   11/22/2019 Not Detected     Immunosuppression       Start End  mycophenolate mofetil (CELLCEPT) 250 mg capsule 11/20/2019     Sig - Route: Take 4 capsules (1,000 mg total) by mouth 2 (two) times daily. - Oral    Notes to Pharmacy: Kidney Transplant Z94.0    predniSONE (DELTASONE) 5 mg tablet 11/10/2019     Sig - Route: Take 2 tablets (10 mg total) by mouth daily. Take with food. - Oral    Notes to Pharmacy: Kidney Transplant Z94.0    tacrolimus (PROGRAF) 1 mg immediate release capsule 12/06/2019 12/05/2020    Sig - Route: Take 8 capsules (8 mg total) by mouth 2 (two) times daily. - Oral    Notes to Pharmacy: Kidney Transplant Z94.0        Antihypertensives       Start End    amLODIPine (NORVASC) 10 mg tablet 11/12/2019 11/11/2020    Sig - Route: Take 1 tablet (10 mg total) by mouth every morning. - Oral    carvediloL (COREG) 25 mg Immediate Release tablet 11/12/2019 11/11/2020    Sig - Route: Take 1 tablet (25 mg total) by mouth 2 (two) times daily with breakfast and dinner. - Oral        Transplant Prophylaxis             atovaquone (MEPRON) 750 mg/5 mL suspension Take 10 mLs (1,500 mg total) by mouth daily with dinner.    Number of times this order has been changed since signing: 2     Order Audit Trail     valGANciclovir (VALCYTE) 450 mg tablet Take 1 tablet (450 mg total) by mouth Every Monday, Wednesday, and Friday.    Number of times this order has been changed since signing: 3     Order Audit Trail     atovaquone (MEPRON) 750 mg/5 mL suspension (Discontinued) Take 10 mLs (1,500 mg total) by mouth daily with dinner.    Number of times this order has been changed since signing: 8     Order Audit Trail     fluconazole (DIFLUCAN) 100 mg tablet (Discontinued) Take 1 tablet (100 mg total) by mouth daily.    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 Stop lokelma.    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    - anemia: Hb of 9.3   - volume: euvolemic   - lipid: on lipitor 40mg /day     Immunosuppression: tacrolimus 8/8, MMF 1000mg  bid, prednisone 10mg /day   - tacrolimus drug level    - 11.6 (goal ~10), no changes     Hypertension: goal blood pressure <130/80   - currently on amlodipine 10mg /day and coreg 25mg  bid, home BP at 120's range, no changes. (lasix was stopped during his last visit)   - avoid salt, more fruits and vegetables in diet, regular exercise, avoid gaining weight   - No changes to the current antihypertensive regimen    Infection Prophylaxis:    -  on valcyte 450mg  MWF and atovaquonone, fluconazole stopped today     RTC in one week.         Kathaleen Maser, MD, FASN  Assistant Professor of Medicine  Transplant nephrologist   Section of Nephrology  Department of Internal Medicine   [facility]  [phone]  Tel: [phone]

## 2019-12-10 ENCOUNTER — Encounter: Admit: 2019-12-10 | Payer: PRIVATE HEALTH INSURANCE | Attending: Nephrology | Primary: Family Medicine

## 2019-12-10 ENCOUNTER — Encounter: Admit: 2019-12-10 | Payer: PRIVATE HEALTH INSURANCE | Primary: Family Medicine

## 2019-12-10 LAB — VITAMIN D, 25-HYDROXY
BKR VITAMIN D, 25-HYDROXY TOTAL (YH): 17 ng/mL — ABNORMAL LOW
BKR VITAMIN D2, 25-HYDROXY: <5 ng/mL
BKR VITAMIN D3, 25-HYDROXY: 17 ng/mL

## 2019-12-10 MED ORDER — ERGOCALCIFEROL (VITAMIN D2) 1,250 MCG (50,000 UNIT) CAPSULE
1250 mcg (50,000 unit) | ORAL_CAPSULE | ORAL | 1 refills | 84 days | Status: AC
Start: 2019-12-10 — End: 2020-03-16
  Filled 2019-12-12: qty 12, 84d supply, fill #1
  Filled 2019-12-12: qty 12, 84d supply, fill #0

## 2019-12-10 MED FILL — ATORVASTATIN 40 MG TABLET: 40 mg | ORAL | 30 days supply | Qty: 30 | Fill #0 | Status: CP

## 2019-12-10 NOTE — Progress Notes
Vitamin D level 17 - will start on ergocalciferol 50,000 units weekly for 12 weeks. Rx was sent to [facility]. Pt was made aware.

## 2019-12-10 NOTE — Other
12/10/19 12:07 PM Results reviewed. Wil lstart ergocalciferol 50K units x 12 weeks. Stanford Scotland, MD

## 2019-12-11 ENCOUNTER — Ambulatory Visit: Admit: 2019-12-11 | Payer: PRIVATE HEALTH INSURANCE | Attending: Family | Primary: Family Medicine

## 2019-12-11 ENCOUNTER — Inpatient Hospital Stay: Admit: 2019-12-11 | Discharge: 2019-12-11 | Payer: PRIVATE HEALTH INSURANCE | Primary: Family Medicine

## 2019-12-11 ENCOUNTER — Encounter: Admit: 2019-12-11 | Payer: PRIVATE HEALTH INSURANCE | Attending: Family | Primary: Family Medicine

## 2019-12-11 DIAGNOSIS — R000 Tachycardia, unspecified: Secondary | ICD-10-CM

## 2019-12-11 DIAGNOSIS — G4733 Obstructive sleep apnea (adult) (pediatric): Secondary | ICD-10-CM

## 2019-12-11 DIAGNOSIS — E785 Hyperlipidemia, unspecified: Secondary | ICD-10-CM

## 2019-12-11 DIAGNOSIS — E7800 Pure hypercholesterolemia, unspecified: Secondary | ICD-10-CM

## 2019-12-11 DIAGNOSIS — I10 Essential (primary) hypertension: Secondary | ICD-10-CM

## 2019-12-11 DIAGNOSIS — I770 Arteriovenous fistula, acquired: Secondary | ICD-10-CM

## 2019-12-11 DIAGNOSIS — E669 Obesity, unspecified: Secondary | ICD-10-CM

## 2019-12-11 DIAGNOSIS — I447 Left bundle-branch block, unspecified: Secondary | ICD-10-CM

## 2019-12-11 DIAGNOSIS — I712 Thoracic aortic aneurysm (HC Code): Secondary | ICD-10-CM

## 2019-12-11 DIAGNOSIS — D649 Anemia, unspecified: Secondary | ICD-10-CM

## 2019-12-11 DIAGNOSIS — K219 Gastro-esophageal reflux disease without esophagitis: Secondary | ICD-10-CM

## 2019-12-11 DIAGNOSIS — J101 Influenza due to other identified influenza virus with other respiratory manifestations: Secondary | ICD-10-CM

## 2019-12-11 DIAGNOSIS — N2581 Secondary hyperparathyroidism of renal origin: Secondary | ICD-10-CM

## 2019-12-11 DIAGNOSIS — K7460 Unspecified cirrhosis of liver: Secondary | ICD-10-CM

## 2019-12-11 DIAGNOSIS — T827XXA Infection and inflammatory reaction due to other cardiac and vascular devices, implants and grafts, initial encounter: Secondary | ICD-10-CM

## 2019-12-11 DIAGNOSIS — E213 Hyperparathyroidism, unspecified: Secondary | ICD-10-CM

## 2019-12-11 DIAGNOSIS — N186 End stage renal disease: Secondary | ICD-10-CM

## 2019-12-11 NOTE — Patient Instructions
?	  No hay cambios en sus medicamentos?	Un monitor de dos dias fue puesto hoy?	Cita telefonica conmigo 10 dias despues de que entregue el monitor?	Cita con el doctor [place] el dia 28 de enero del 2022

## 2019-12-11 NOTE — Progress Notes
[facility]	 [facility]		Office VisitLuis A Harper is a 59 y.o.male followed by Dr. 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 Dell Ponto (04/24/19) he appeared stable from a cardiovascular perspective and was said to have an abnormal EKG noting incomplete left bundle branch block with likely LVH/strain pattern and T-wave inversions in V4-V6, and T-wave inversions in 1 and aVL. His elevated LVEDP on catheterization was said to be possibly related to high blood pressure, sleep apnea, and kidney disease with ongoing hemodialysis. He was said to be stable given his recent cardiac workup including cardiac catheterization and no changes were made to his medical management.His last office visit was with me on 07/06/19. This was a preoperative visit in anticipation of a parathyroidectomy with Dr. Gailen Shelter. During the visit he appeared stable from a cardiovascular perspective and was said to require no additional cardiac testing prior to surgery. Today his weight is 249.6 pounds, no change in approximately 5 months. He arrives to the visit alone and reports that over the last week he has noticed that when he checks his blood pressure at home his machine reads normal blood pressures in the 120's/70's mmHg but his heart rate sometimes reads high in the 120's bpm. He notes that when he obtains the higher heart rates with his machine he rechecks this and it usually reads in the 70's-80's bpm the second time. He denies palpitations, skipped heart beats and other symptoms. He has had his blood pressure monitor for about one month. 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 He is independent with his ADL's and is able to walk in the grocery store, bring groceries into his home and go for short walks without problems. He does not experience chest pain, shortness of breath or other anginal or exertional symptoms with his activities or at rest. He reports an occasional feeling of someone touching the middle of his chest which usually occurs at rest. He comments that this is not pain. There is no associated nausea, vomiting, diaphoresis, dizziness or other symptoms with the chest feeling. He denies chest pain, palpitations, jaw pain, arm pain, other anginal symptoms, shortness of breath, PND, orthopnea, syncope, presyncope, falls, bleeding, edema, and neurologic symptoms indicative of a stroke. He is compliant with all of his medications.  Problem list:Patient Active Problem List  Diagnosis Date Noted ? Deceased-donor kidney transplant 11/19/2019 ? Kidney transplant complication 11/18/2019 ? AKI (acute kidney injury) (HC Code) 11/17/2019 ? Immunosuppressive management encounter following kidney transplant 11/19/2019 ? Deceased-donor kidney transplant recipient 11/07/2019 ? Alkaline phosphatase raised 08/17/2019 ? Decreased testosterone level 08/17/2019 ? Gastroesophageal reflux disease 08/17/2019 ? 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 Newborn affected by abnormality in fetal (intrauterine) heart rate or rhythm, unspecified as to time of onset 08/17/2019 ? AVF (arteriovenous fistula) (HC Code) 02/20/2019 ? Arteriovenous fistula stenosis, subsequent encounter 01/12/2019   Added automatically from request for surgery [id number] ? 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 Colon polyps 08/10/2018 ? Internal hemorrhoids 08/10/2018 ? 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 Obese 02/03/2018 ? Hypertriglyceridemia 06/23/2015 ? Hypertension 11/27/2014 ? 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 Anemia in ESRD (end-stage renal disease) (HC Code) 10/24/2014   Added automatically from request for surgery [id number] Added automatically from request for surgery [id number] Overview: Added automatically from request for surgery [id number] ? 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 Hyperparathyroidism (HC Code) 04/13/2010   59 y.o. male with secondary hyperparathyroidism, on hemodialysisUltrasound: parathyroid localization 07/11/2018 [facility]  Right lobe 5.2 cm.  Left lobe 4.6 cm..  Small nodules bilaterally right lobe 9mm and 4mm., left lobe 8mm and 7mm (TIRADS 1 & 2 )                    No parathyroids identified.Calcium: 9.1  (05/16/19)Intact PTH: 901  (05/16/19)25-hydroxy-vitamin D: Creatinine: 14.37  (05/16/19)Formatting of this note might be different from the original.58 y.o. male with secondary hyperparathyroidism, on hemodialysisUltrasound: parathyroid localization 07/11/2018 [facility]  Right lobe 5.2 cm.  Left lobe 4.6 cm..  Small nodules bilaterally right lobe 9mm and 4mm., left lobe 8mm and 7mm (TIRADS 1 & 2 )                    No parathyroids identified.Calcium: 9.1  (05/16/19)Intact PTH: 901  (05/16/19)25-hydroxy-vitamin D: Creatinine: 14.37  (05/16/19) Medications:Current Outpatient Medications Medication Sig Dispense Refill ? amLODIPine (NORVASC) 10 mg tablet Take 1 tablet (10 mg total) by mouth every morning. 90 tablet 3 ? atorvastatin (LIPITOR) 40 mg tablet Take 1 tablet (40 mg total) by mouth daily. 30 tablet 5 ? atovaquone (MEPRON) 750 mg/5 mL suspension Take 10 mLs (1,500 mg total) by mouth daily with dinner. 210 mL 6 ? carvediloL (COREG) 25 mg Immediate Release tablet Take 1 tablet (25 mg total) by mouth 2 (two) times daily with breakfast and dinner. 60 tablet 11 ? ergocalciferol (ERGOCALCIFEROL) 1,250 mcg (50,000 unit) capsule Take 1 capsule (50,000 Units total) by mouth once a week. 12 capsule 0 ? mycophenolate mofetil (CELLCEPT) 250 mg capsule Take 4 capsules (1,000 mg total) by mouth 2 (two) times daily. 240 capsule 11 ? omeprazole (PRILOSEC) 20 mg capsule Take 1 capsule (20 mg total) by mouth daily. 90 capsule 1 ? predniSONE (DELTASONE) 5 mg tablet Take 2 tablets (10 mg total) by mouth daily. Take with food. (Patient taking differently: Take 7.5 mg by mouth daily. Take with food.) 60 tablet 11 ? tacrolimus (PROGRAF) 1 mg immediate release capsule Take 8 capsules (8 mg total) by mouth 2 (two) times daily. 480 capsule 11 ? valGANciclovir (VALCYTE) 450 mg tablet Take 1 tablet (450 mg total) by mouth Every Monday, Wednesday, and Friday. 12 tablet 2 No current facility-administered medications for this visit. Allergies:He has No Known Allergies.Past Medical History:Past Medical History: Diagnosis Date ? A-V fistula (HC Code)   right arm ? Anemia  ? 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 Overview:  Added automatically from request for surgery [id number] Overview:  Added automatically from request for surgery [id number] ? AV fistula infection (HC Code)  ? Cirrhosis (HC Code)  ? 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 GERD (gastroesophageal reflux disease)  ? Hypercholesteremia  ? Hyperparathyroidism (HC Code)  ? Hypertension  ? Infected prosthetic vascular graft, initial encounter (HC Code) 10/24/2014 ? Influenza A  ? Morbid obesity (HC Code) 02/03/2018 ? Obstructive sleep apnea   pt uses  CPAP ? Secondary hyperparathyroidism of renal origin (HC Code)  ? Thoracic aortic aneurysm [facility] Code)  Past Surgical History:Past Surgical History: Procedure Laterality Date ? AV FISTULA PLACEMENT Left   left lower arm unsuccessful, then moved to upper arm 2011, then had problems in 10/2013 - infection Social History:Social History Socioeconomic History ? Marital status: Married   Spouse name: Not on file ? Number of children: Not on file ? Years of education: Not on file ? Highest education level: Not on file Occupational History ? Not on file Tobacco Use ? Smoking status: Former Smoker   Packs/day: 0.25   Years: 25.00   Pack years: 6.25   Types: Cigarettes ? Smokeless tobacco: Never Used Vaping Use ? Vaping Use: Never used Substance and Sexual Activity ? Alcohol use: No ? Drug use: No ? Sexual activity: Yes   Partners: Female Other Topics Concern ? Not on file Social History Narrative ? Not on file Social Determinants of Health Financial Resource Strain:  ? Difficulty of Paying Living Expenses:  Food Insecurity:  ? Worried About Programme researcher, broadcasting/film/video in the Last Year:  ? Barista in the Last Year:  Transportation Needs:  ? Freight forwarder (Medical):  ? Lack of Transportation (Non-Medical):  Physical Activity:  ? Days of Exercise per Week:  ? Minutes of Exercise per Session:  Stress:  ? Feeling of Stress :  Social Connections:  ? Frequency of Communication with Friends and Family:  ? Frequency of Social Gatherings with Friends and Family:  ? Attends Religious Services:  ? Active Member of Clubs or Organizations:  ? Attends Banker Meetings:  ? Marital Status:  Intimate Partner Violence:  ? Fear of Current or Ex-Partner:  ? Emotionally Abused:  ? Physically Abused:  ? Sexually Abused:  Family History:No family history on file.ROS The following ROS documentation is the transcribed review of systems completed by the patient at intake:Review of Systems:Constitution: negativeHENT: negativeEyes: negativeCardiovascular: negativeRespiratory: negativeEndocrine: negativeHematologic/Lymphatic: negativeSkin: negativeMusculoskeletal: negativeGastrointestinal: negativeGenitourinary: negativeNeurological: negativePsychiatric/Behavioral: negativeAllergic/Immunologic: negativeThe remainder of the 12-point review of systems was reviewed with the patient and is negative.Vital Signs: BP (P) 122/70 (Site: l a, Position: Sitting, Cuff Size: Large)  - Pulse (P) 66  - Ht 5' 9 (1.753 m)  - Wt 113.2 kg  - SpO2 (P) 98%  - BMI 36.86 kg/m? Wt Readings from Last 3 Encounters: 12/11/19 113.2 kg 12/06/19 111 kg 12/03/19 110.9 kg Physical ExamGeneral: alert, speaking easily, NAD.HEENT: nonicteric, mmm.Neck: difficult to assess JVP due to body habitus, no carotid bruit.Heart: regular heart tones. S1,S2. Soft SEM heard over LSB. No rubs, clicks, diastolic murmurs or gallops.Lungs: CTA bilaterally. Unlabored respirations. No rales, wheeze, or rhonchi.Abdomen: soft, nontender, +BS.Extremities: trace bilateral ankle edema, well-perfused. +2 DP pulses bilaterally.Skin: warm and dry.Psych: friendly, appropriate to situation. A&O x 3.Labs: 12/06/2019 08:55 Sodium 141 Potassium 3.5 Chloride 101 CO2 24 Anion Gap 16 BUN 47 (H) Creatinine 3.14 (H) BUN/Creatinine Ratio 15.0 eGFR (NON African-American) 21 eGFR (Afr Amer) 25 Glucose 112 (H) Calcium 8.5 (L) Magnesium 1.4 (L) Phosphorus 3.9 Parathyroid Hormone, Intact 37.5 Vitamin D, 25-OH D3 17 Vitamin D, 25-OH D2 <5 Vitamin D, 25-Hydroxy, Total (LCMSMS) 17 (L) Tacrolimus Lvl 11.6 WBC 3.2 (L) RBC 2.7 (L) Hemoglobin 9.3 (L) Hematocrit 27.3 (L) MCV 103.0 (H) MCH 35.1 (H) MCHC 34.1 RDW-CV 13.2 nRBC 0.0 Platelets 284 MPV 9.4 Neutrophils 85.9 (H) Lymphocytes 1.6 (L) Monocytes 10.4 Eosinophils 0.6 Basophils 0.6 ANC (Abs Neutrophil Count) 2.7 Absolute Lymphocyte Count 0.1 (L) Monocytes (  Absolute) 0.3 Eosinophil Absolute Count 0.0 Basophils Absolute 0.0 Immature Granulocytes (Abs) 0.0 nRBC Absolute 0.0 Immature Granulocytes 0.9 Lipid Panel:Lab Results Component Value Date  CHOL 104 10/03/2018  TRIG 185 (H) 10/03/2018  HDL 24 (L) 10/03/2018  LDL 43 10/03/2018 EKG: sinus rhythm, left bundle branch block, 66 bpm. No significant change from prior tracing obtained 11/18/19.ECHO:  Results for orders placed or performed during the hospital encounter of 10/03/19 Echo 2D Complete w Doppler and CFI if Ind Image Enhancement 3D and or bubbles Result Value Ref Range  Reported Biplane EF% 57 %  Narrative   * Normal left ventricular size, systolic function and wall motion. Moderate concentric left ventricular hypertrophy.  LVEF calculated by biplane Simpson's was 57%.  Abnormal tissue Doppler suggestive of abnormal diastolic function.* Normal right ventricular cavity size and systolic function.  Right ventricular systolic pressure is unable to be estimated due to insufficient Doppler signal.* Atria are normal in size.* No significant valvular abnormalities.* All visible segments of the aorta are normal in size.* IVC diameter < 2.1 cm that collapses > 50% with a sniff suggests normal RAP (0-5 mmHg, mean 3 mmHg).* No evidence of pericardial effusion.* Compared with the prior study, dated 11/10/2018, the measurements of the left ventricular wall thickness, the left atrium, and the ascending aorta were previously abnormal. Technical differences in image acquisition may account for at least some of this apparent change. STRESS TEST: Results for orders placed or performed during the hospital encounter of 01/02/15 NM Spect Reserve Read [facility])  Narrative  Limited Noncontrast [place] of the Chest for SPECT Attenuation Correction.  Date:  01/02/2015 11:52 AMHistory/Indication: pre renal transplant, htnTechnique:Expiratory noncontrast [place] imaging of the chest was performed for SPECT attenuation correctionTechnical limitations: Limitations of low-dose technique and expiratory image acquisition Comparison: No similar studies are available for comparison at the time of interpretation.FINDINGSThe nuclear cardiac findings will be reported under a separate accession number by the cardiology service. Lungs/Airways/Pleura : The visualized lungs are clear within the limitations of low-dose technique and expiratory image acquisition. There is no pleural effusion. Mediastinum, Lymph nodes: A few borderline enlarged lymph nodes are seen in the mediastinum, particularly in the prevascular and right lower paratracheal stations, measuring up to 1 cm in short axis. There are also prominent bilateral axillary lymph nodes, partially imaged and measuring less than 1 cm in short axis.[place] Heart and Vessels: The left ventricle appears prominent. There is moderate to severe three-vessel coronary artery calcification. No pericardial effusion. Mid ascending aortic size is 4.1 cm. There is a catheter in the superior vena cava that terminates in the right atrium. The main pulmonary artery is prominent measuring approximately 3.2 cm in transverse diameter.Abdomen: Within the limitations of the low dose technique the visualized upper abdominal organs are unremarkable.Osseous structures and Soft Tissues: No aggressive bone lesions. Degenerative changes are seen in the visualized spine. Degenerative changes are seen in the visualized spine.  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  There is no ischemia identified.  Truncation artifact is possible.  Left ventricular ejection fraction was normal with normal wall motion.  Stress electrocardiogram was normal.  [place] performed for attenuation correction showed moderate LAD, diagonal as well as mild LCX, RCA and aortic calcification.  Non-cardiac findings of the [place] are described in a separate report from Radiology.No significant change compared to prior study dated 09/12/2008.  IMPRESSION/PLAN:?	Elevated Heart Rate: Mr. Andel reports recent episodes where his home blood pressure monitor reads elevated heart rates in the 120's bpm. He denies palpitations, skipped heart beats and other symptoms that would raise concern for arrhythmia, however, given his elevated readings on home blood pressure monitor I advised a 48-hour holter monitor to assess his heart rate and rhythm. I made no changes to his current medications.?	Hypertension: appears to be well controlled with acceptable reading obtained during today's office visit. He remains on an antihypertensive regimen with Carvedilol 25 mg BID and Amlodipine 10 mg daily. ?	Dyslipidemia: treated, he remains on daily statin therapy with Lipitor 40 mg and denies myalgias and other side effects from the medication.**48-Hour Holter monitor **Telephone consult with me on 01/02/20**Follow-up with Dr. Dell Ponto on 1/28/22I encouraged the patient to call me with any questions, concerns, or any worsening or new symptoms.Tad Moore, APRN9/28/20213:13 PMYale [facility] & [facility] Center Office: [phone]) [phone] Signed by Tad Moore, APRN, December 11, 2019

## 2019-12-13 ENCOUNTER — Ambulatory Visit: Admit: 2019-12-13 | Payer: PRIVATE HEALTH INSURANCE | Attending: Nephrology | Primary: Family Medicine

## 2019-12-13 ENCOUNTER — Encounter: Admit: 2019-12-13 | Payer: PRIVATE HEALTH INSURANCE | Attending: Nephrology | Primary: Family Medicine

## 2019-12-13 ENCOUNTER — Inpatient Hospital Stay: Admit: 2019-12-13 | Discharge: 2019-12-13 | Payer: PRIVATE HEALTH INSURANCE | Primary: Family Medicine

## 2019-12-13 DIAGNOSIS — K219 Gastro-esophageal reflux disease without esophagitis: Secondary | ICD-10-CM

## 2019-12-13 DIAGNOSIS — Z796 Long-term use of immunosuppressant medication: Secondary | ICD-10-CM

## 2019-12-13 DIAGNOSIS — I10 Essential (primary) hypertension: Secondary | ICD-10-CM

## 2019-12-13 DIAGNOSIS — I712 Thoracic aortic aneurysm (HC Code): Secondary | ICD-10-CM

## 2019-12-13 DIAGNOSIS — Z298 Encounter for other specified prophylactic measures: Secondary | ICD-10-CM

## 2019-12-13 DIAGNOSIS — J101 Influenza due to other identified influenza virus with other respiratory manifestations: Secondary | ICD-10-CM

## 2019-12-13 DIAGNOSIS — E7800 Pure hypercholesterolemia, unspecified: Secondary | ICD-10-CM

## 2019-12-13 DIAGNOSIS — Z940 Kidney transplant status: Secondary | ICD-10-CM

## 2019-12-13 DIAGNOSIS — N2581 Secondary hyperparathyroidism of renal origin: Secondary | ICD-10-CM

## 2019-12-13 DIAGNOSIS — Z48298 Encounter for aftercare following other organ transplant: Secondary | ICD-10-CM

## 2019-12-13 DIAGNOSIS — G4733 Obstructive sleep apnea (adult) (pediatric): Secondary | ICD-10-CM

## 2019-12-13 DIAGNOSIS — K7460 Unspecified cirrhosis of liver: Secondary | ICD-10-CM

## 2019-12-13 DIAGNOSIS — Z79899 Other long term (current) drug therapy: Secondary | ICD-10-CM

## 2019-12-13 DIAGNOSIS — N186 End stage renal disease: Secondary | ICD-10-CM

## 2019-12-13 DIAGNOSIS — Z2989 Need for prophylactic immunotherapy: Secondary | ICD-10-CM

## 2019-12-13 DIAGNOSIS — I770 Arteriovenous fistula, acquired: Secondary | ICD-10-CM

## 2019-12-13 DIAGNOSIS — T827XXA Infection and inflammatory reaction due to other cardiac and vascular devices, implants and grafts, initial encounter: Secondary | ICD-10-CM

## 2019-12-13 DIAGNOSIS — D649 Anemia, unspecified: Secondary | ICD-10-CM

## 2019-12-13 DIAGNOSIS — E213 Hyperparathyroidism, unspecified: Secondary | ICD-10-CM

## 2019-12-13 LAB — CBC WITH AUTO DIFFERENTIAL
BKR EGFR (AFR AMER): 0 x 1000/??L — ABNORMAL LOW (ref 1.0–4.0)
BKR WAM ABSOLUTE IMMATURE GRANULOCYTES: 0 x 1000/ÂµL (ref 0.0–0.3)
BKR WAM ABSOLUTE LYMPHOCYTE COUNT: 0 x 1000/ÂµL — ABNORMAL LOW (ref 1.0–4.0)
BKR WAM ABSOLUTE NRBC: 0 x 1000/??L (ref 0.0–0.0)
BKR WAM ANALYZER ANC: 4.5 x 1000/??L — ABNORMAL HIGH (ref 1.0–11.0)
BKR WAM BASOPHIL ABSOLUTE COUNT: 0 x 1000/??L (ref 0.0–0.0)
BKR WAM BASOPHILS: 0.2 % (ref 0.0–4.0)
BKR WAM EOSINOPHIL ABSOLUTE COUNT: 0 x 1000/??L (ref 0.0–1.0)
BKR WAM EOSINOPHILS: 0.4 % (ref 0.0–7.0)
BKR WAM HEMOGLOBIN: 9 g/dL — ABNORMAL LOW (ref 12.0–18.0)
BKR WAM IMMATURE GRANULOCYTES: 0.8 % (ref 0.0–3.0)
BKR WAM LYMPHOCYTES: 0.8 % — ABNORMAL LOW (ref 8.0–49.0)
BKR WAM MCH (PG): 35.2 pg — ABNORMAL HIGH (ref 27.0–31.0)
BKR WAM MCHC: 32.6 g/dL (ref 31.0–36.0)
BKR WAM MCV: 107.8 fL — ABNORMAL HIGH (ref 78.0–94.0)
BKR WAM MONOCYTE ABSOLUTE COUNT: 0.3 x 1000/??L (ref 0.0–2.0)
BKR WAM MONOCYTES: 5.7 % (ref 4.0–15.0)
BKR WAM MPV: 9.2 fL (ref 6.0–11.0)
BKR WAM NEUTROPHILS: 92.1 % — ABNORMAL HIGH (ref 37.0–84.0)
BKR WAM NUCLEATED RED BLOOD CELLS: 0 % (ref 0.0–1.0)
BKR WAM PLATELETS: 233 x1000/ÂµL (ref 140–440)
BKR WAM RDW-CV: 13.3 % (ref 11.5–14.5)
BKR WAM RED BLOOD CELL COUNT: 2.6 M/??L — ABNORMAL LOW (ref 3.8–5.9)
BKR WAM WHITE BLOOD CELL COUNT: 4.9 x1000/??L (ref 4.0–10.0)

## 2019-12-13 LAB — PROTEIN, TOTAL W/CREATININE, URINE, RANDOM     (BH GH LMW YH)
BKR CREATININE, URINE, RANDOM: 86 mg/dL (ref 4.0–10.0)
BKR PROTEIN URINE RANDOM: 0.15 g/L
BKR PROTEIN/CREATININE RATIO, URINE, RANDOM: 0.17 mg/mg{creat} — ABNORMAL HIGH (ref <0.10)

## 2019-12-13 LAB — BASIC METABOLIC PANEL
BKR ANION GAP: 16 % (ref 7–17)
BKR BLOOD UREA NITROGEN: 23 mg/dL — ABNORMAL HIGH (ref 6–20)
BKR BUN / CREAT RATIO: 10.6 (ref 8.0–23.0)
BKR CALCIUM: 9.1 mg/dL — ABNORMAL HIGH (ref 8.8–10.2)
BKR CHLORIDE: 105 mmol/L — ABNORMAL HIGH (ref 98–107)
BKR CO2: 21 mmol/L (ref 20–30)
BKR CREATININE: 2.16 mg/dL — ABNORMAL HIGH (ref 0.40–1.30)
BKR EGFR (NON AFRICAN AMERICAN): 32 mL/min/1.73m2 (ref >60)
BKR GLUCOSE: 105 mg/dL — ABNORMAL HIGH (ref 70–100)
BKR POTASSIUM: 3.8 mmol/L (ref 3.3–5.1)
BKR SODIUM: 142 mmol/L — ABNORMAL LOW (ref 136–144)
BKR WAM HEMATOCRIT: 3.8 mmol/L — ABNORMAL LOW (ref 3.3–5.1)

## 2019-12-13 LAB — ALBUMIN/CREATININE PANEL, URINE, RANDOM
BKR ALBUMIN, URINE, RANDOM: 50.5 mg/L
BKR ALBUMIN/CREATININE RATIO, URINE, RANDOM: 59.1 mg/g Cr — ABNORMAL HIGH (ref <30.0)
BKR CREATININE, URINE, RANDOM: 86 mg/dL

## 2019-12-13 LAB — MAGNESIUM: BKR MAGNESIUM: 1.7 mg/dL (ref 1.7–2.4)

## 2019-12-13 LAB — TACROLIMUS LEVEL     (BH GH L LMW YH): BKR TACROLIMUS BLOOD: 10.6 ng/mL (ref 0.0–1.0)

## 2019-12-13 LAB — PHOSPHORUS     (BH GH L LMW YH): BKR PHOSPHORUS: 2.8 mg/dL (ref 2.2–4.5)

## 2019-12-13 NOTE — Progress Notes
[facility] 			 Kidney/Pancreas Transplant Program Medical follow up Visit Chief complaint:  Adam Harper returns for follow up ZO:XWRUEA transplant function assessmentImmuno drug therapy requiring intensive monitoring for efficacy and toxicityBlood pressure managementImportant Transplant History:  ?	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	CMV D + / R + ?	Donor Toxo positive ?	CPRA zero, Alemtuzumab ?	9/4-9/7: admit for hyperK+, urgent HD. Bactrim switched to Atovaquone?	Intermittent pre-syncopal events, holter monitoring.Last updated : 12/13/19 , Stanford Scotland, MD History of Present Illness: Adam Harper does not report symptoms referable to his transplanted kidney.He  reports compliance with his immunosuppressive medications and does not report drug related side effects. He denies specifically, fevers, diarrhea, dysuria, urinary urgency and pain over his allograft. Wife describes a pre-syncopal event at home. Following up with cardiology. No pedal edema PFSH reviewed and confirmed 12/13/2019 Review of Systems In addition, following review of systems was conducted : Review of Systems Constitutional: Negative for chills, fever and weight loss. HENT: Negative for hearing loss.  Eyes: Negative.  Respiratory: Negative for cough, hemoptysis and sputum production.  Cardiovascular: Negative.  Negative for chest pain, palpitations, orthopnea, claudication and leg swelling. Gastrointestinal: Negative for abdominal pain, blood in stool, constipation and diarrhea. Musculoskeletal: Negative.  Skin: Negative for rash. Neurological: Negative for dizziness, tremors and headaches. Psychiatric/Behavioral: Negative for depression.  Physical Exam   Vitals: BP (!) 143/81 (Site: l a, Position: Sitting, Cuff Size: Large)  - Pulse 69  - Temp 97.6 ?F (36.4 ?C) (Temporal)  - Wt 113.4 kg - SpO2 100%  - BMI 36.92 kg/m? GENERAL : Appears well, not in distress, appropriately conversant. HEENT : EOMI , mucous membranes moist RESPIRATORY : NVBS heard bilaterally, no adventitious breath sounds heard. CARDIOVASCULAR : S1 S2 + , no added sounds heard. ABDOMEN : Soft, non-tender, graft site appears well healed and is non-tender.EXTREMITIES : No edemaINTEGUMENT : No rashes on limited skin examWt Readings from Last 4 Encounters: 12/13/19 113.4 kg 12/11/19 113.2 kg 12/06/19 111 kg 12/03/19 110.9 kg   Assessment / Plan # Transplanted Allograft - The most recent serum creatinine is Lab Results Component Value Date  CREATININE 2.16 (H) 12/13/2019  # Immunosuppression Management The the most recent tacrolimus level for today's visit is Lab Results Component Value Date  TACROLIMUS 11.6 12/06/2019 Immunosuppression     Start End  mycophenolate mofetil (CELLCEPT) 250 mg capsule 11/20/2019   Sig - Route: Take 4 capsules (1,000 mg total) by mouth 2 (two) times daily. - Oral  Notes to Pharmacy: Kidney Transplant Z94.0  predniSONE (DELTASONE) 5 mg tablet 11/10/2019   Sig - Route: Take 2 tablets (10 mg total) by mouth daily. Take with food. - Oral  Notes to Pharmacy: Kidney Transplant Z94.0  tacrolimus (PROGRAF) 1 mg immediate release capsule 12/06/2019 12/05/2020  Sig - Route: Take 8 capsules (8 mg total) by mouth 2 (two) times daily. - Oral  Notes to Pharmacy: Kidney Transplant Z94.0  # Blood Pressure ManagementAntihypertensives     Start End  amLODIPine (NORVASC) 10 mg tablet 11/12/2019 11/11/2020  Sig - Route: Take 1 tablet (10 mg total) by mouth every morning. - Oral  carvediloL (COREG) 25 mg Immediate Release tablet 11/12/2019 11/11/2020  Sig - Route: Take 1 tablet (25 mg total) by mouth 2 (two) times daily with breakfast and dinner. - Oral  No changes to current regimen. # Prophylaxis Current prophylactic Medications [id number] Prophylaxis       atovaquone (MEPRON) 750 mg/5 mL suspension Take 10 mLs (1,500 mg total)  by mouth daily with dinner.  Number of times this order has been changed since signing: 3   Order Audit Trail   valGANciclovir (VALCYTE) 450 mg tablet Take 1 tablet (450 mg total) by mouth Every Monday, Wednesday, and Friday.  Number of times this order has been changed since signing: 3   Order Audit Trail     # Hematology Most recent WBC and Hb is Lab Results Component Value Date  WBC 4.9 12/13/2019  Lab Results Component Value Date  HGB 9.0 (L) 12/13/2019 .  # Viral monitoringLuis A Say is Lab Results Component Value Date  CYTOMEGALOVI Positive (A) 10/02/2019 Most recent BK viral testing -- BK Virus Quantitative PCR (no units) Date Value 12/06/2019 Not Detected Most recent CMV viral testing -- Cytomegalovirus Quantitative PCR (no units) Date Value 11/22/2019 Not Detected # Electrolytes Most recent magnesium is Lab Results Component Value Date  MG 1.7 12/13/2019 , most recent calcium is  Lab Results Component Value Date  CALCIUM 9.1 12/13/2019  and most recent phosphorus is Lab Results Component Value Date  PHOS 2.8 12/13/2019 . Summary of Plan : ?	No changes to current therapy. ?	Dose of immunosuppressive medications will be changed if necessary after review of levels drawn. ?	Follow up with cardiology for holter monitoring. ?	Watch for low blood pressure trends at home. So far, not orthostatic. ?	Follow up with Sleep medicine as scheduled. ?	Follow up on Thursday. Stanford Scotland MDTransplant Nephrologist Electronically Signed by Stanford Scotland, MD, 12/13/2019

## 2019-12-14 ENCOUNTER — Telehealth: Admit: 2019-12-14 | Payer: PRIVATE HEALTH INSURANCE | Primary: Family Medicine

## 2019-12-14 ENCOUNTER — Encounter: Admit: 2019-12-14 | Payer: PRIVATE HEALTH INSURANCE | Attending: Adult Health | Primary: Family Medicine

## 2019-12-14 ENCOUNTER — Encounter: Admit: 2019-12-14 | Payer: PRIVATE HEALTH INSURANCE | Primary: Family Medicine

## 2019-12-14 ENCOUNTER — Encounter: Admit: 2019-12-14 | Payer: PRIVATE HEALTH INSURANCE | Attending: Gastroenterology | Primary: Family Medicine

## 2019-12-14 MED ORDER — PREDNISONE 5 MG TABLET
5 mg | ORAL_TABLET | Freq: Every day | ORAL | 6 refills | 30.00 days | Status: AC
Start: 2019-12-14 — End: 2020-01-18
  Filled 2019-12-14: qty 45, 30d supply, fill #0
  Filled 2019-12-14: qty 45, 30d supply, fill #1

## 2019-12-14 MED ORDER — VALGANCICLOVIR 450 MG TABLET
450 mg | ORAL_TABLET | Freq: Every day | ORAL | 2 refills | 30.00 days | Status: AC
Start: 2019-12-14 — End: ?
  Filled 2019-12-25: qty 30, 30d supply, fill #0
  Filled 2019-12-25: qty 30, 30d supply, fill #2

## 2019-12-14 NOTE — Progress Notes
TRANSPLANT PHARMACY:  Valganciclovir Dose AdjustmentLuis A Harper has been referred for Valganciclovir management.Adam Harper is a 59 y.o. male who is s/p DDKT on 11/05/19.Patient is moderate risk for CMV infection and his CrCl is 48.3 mL/min based on most recent serum creatinine. Patient's current valganciclovir regimen is 450 mg Monday, Wednesday and Friday. Per protocol, recommend change to 450 mg once daily. Left voicemail for patient and requested a return call to pharmacy at the earliest convenience.Interpreter ID [phone] from interpreter services was utilized for this call.Brenda Samano IqbalOctober 1, 20213:32 PM

## 2019-12-14 NOTE — Progress Notes
Labs reviewed with transplant nephrologist/surgeon Dr. Virmani, no changes were made.Electronically Signed by  , RN, December 14, 2019

## 2019-12-17 ENCOUNTER — Ambulatory Visit: Admit: 2019-12-17 | Payer: PRIVATE HEALTH INSURANCE | Attending: Nephrology | Primary: Family Medicine

## 2019-12-20 ENCOUNTER — Encounter: Admit: 2019-12-20 | Payer: PRIVATE HEALTH INSURANCE | Primary: Family Medicine

## 2019-12-20 ENCOUNTER — Encounter: Admit: 2019-12-20 | Payer: PRIVATE HEALTH INSURANCE | Attending: Nephrology | Primary: Family Medicine

## 2019-12-20 ENCOUNTER — Inpatient Hospital Stay: Admit: 2019-12-20 | Discharge: 2019-12-20 | Payer: PRIVATE HEALTH INSURANCE | Primary: Family Medicine

## 2019-12-20 ENCOUNTER — Ambulatory Visit: Admit: 2019-12-20 | Payer: PRIVATE HEALTH INSURANCE | Attending: Nephrology | Primary: Family Medicine

## 2019-12-20 DIAGNOSIS — D649 Anemia, unspecified: Secondary | ICD-10-CM

## 2019-12-20 DIAGNOSIS — G4733 Obstructive sleep apnea (adult) (pediatric): Secondary | ICD-10-CM

## 2019-12-20 DIAGNOSIS — Z2989 Need for prophylactic immunotherapy: Secondary | ICD-10-CM

## 2019-12-20 DIAGNOSIS — E213 Hyperparathyroidism, unspecified: Secondary | ICD-10-CM

## 2019-12-20 DIAGNOSIS — Z79899 Other long term (current) drug therapy: Secondary | ICD-10-CM

## 2019-12-20 DIAGNOSIS — Z940 Kidney transplant status: Secondary | ICD-10-CM

## 2019-12-20 DIAGNOSIS — I770 Arteriovenous fistula, acquired: Secondary | ICD-10-CM

## 2019-12-20 DIAGNOSIS — Z48298 Encounter for aftercare following other organ transplant: Secondary | ICD-10-CM

## 2019-12-20 DIAGNOSIS — N2581 Secondary hyperparathyroidism of renal origin: Secondary | ICD-10-CM

## 2019-12-20 DIAGNOSIS — K219 Gastro-esophageal reflux disease without esophagitis: Secondary | ICD-10-CM

## 2019-12-20 DIAGNOSIS — I712 Thoracic aortic aneurysm (HC Code): Secondary | ICD-10-CM

## 2019-12-20 DIAGNOSIS — E7800 Pure hypercholesterolemia, unspecified: Secondary | ICD-10-CM

## 2019-12-20 DIAGNOSIS — K7460 Unspecified cirrhosis of liver: Secondary | ICD-10-CM

## 2019-12-20 DIAGNOSIS — T827XXA Infection and inflammatory reaction due to other cardiac and vascular devices, implants and grafts, initial encounter: Secondary | ICD-10-CM

## 2019-12-20 DIAGNOSIS — J101 Influenza due to other identified influenza virus with other respiratory manifestations: Secondary | ICD-10-CM

## 2019-12-20 DIAGNOSIS — Z298 Encounter for other specified prophylactic measures: Secondary | ICD-10-CM

## 2019-12-20 DIAGNOSIS — N186 End stage renal disease: Secondary | ICD-10-CM

## 2019-12-20 DIAGNOSIS — I10 Essential (primary) hypertension: Secondary | ICD-10-CM

## 2019-12-20 LAB — CBC WITH AUTO DIFFERENTIAL
BKR WAM ABSOLUTE IMMATURE GRANULOCYTES: 0 x 1000/??L (ref 0.0–0.3)
BKR WAM ABSOLUTE LYMPHOCYTE COUNT: 0.1 x 1000/??L — ABNORMAL LOW (ref 1.0–4.0)
BKR WAM ABSOLUTE NRBC: 0 x 1000/??L (ref 0.0–0.0)
BKR WAM ANALYZER ANC: 3.7 x 1000/ÂµL (ref 1.0–11.0)
BKR WAM BASOPHIL ABSOLUTE COUNT: 0 x 1000/??L (ref 0.0–0.0)
BKR WAM BASOPHILS: 0.5 % (ref 0.0–4.0)
BKR WAM EOSINOPHIL ABSOLUTE COUNT: 0 x 1000/??L (ref 0.0–1.0)
BKR WAM EOSINOPHILS: 0.7 % (ref 0.0–7.0)
BKR WAM HEMATOCRIT: 28.6 % — ABNORMAL LOW (ref 37.0–52.0)
BKR WAM HEMOGLOBIN: 9.3 g/dL — ABNORMAL LOW (ref 12.0–18.0)
BKR WAM IMMATURE GRANULOCYTES: 0.5 % — ABNORMAL LOW (ref 0.0–3.0)
BKR WAM MCH (PG): 34.6 pg — ABNORMAL HIGH (ref 27.0–31.0)
BKR WAM MCHC: 32.5 g/dL (ref 31.0–36.0)
BKR WAM MCV: 106.3 fL — ABNORMAL HIGH (ref 78.0–94.0)
BKR WAM MONOCYTE ABSOLUTE COUNT: 0.4 x 1000/??L (ref 0.0–2.0)
BKR WAM MONOCYTES: 9.1 % (ref 4.0–15.0)
BKR WAM MPV: 9.6 fL (ref 6.0–11.0)
BKR WAM NEUTROPHILS: 88 % — ABNORMAL HIGH (ref 37.0–84.0)
BKR WAM NUCLEATED RED BLOOD CELLS: 0 % (ref 0.0–1.0)
BKR WAM PLATELETS: 220 x1000/ÂµL (ref 140–440)
BKR WAM RDW-CV: 13.3 % — ABNORMAL HIGH (ref 11.5–14.5)
BKR WAM RED BLOOD CELL COUNT: 2.7 M/??L — ABNORMAL LOW (ref 3.8–5.9)
BKR WAM WHITE BLOOD CELL COUNT: 4.2 x1000/??L (ref 4.0–10.0)

## 2019-12-20 LAB — BASIC METABOLIC PANEL
BKR ANION GAP: 14 (ref 7–17)
BKR BLOOD UREA NITROGEN: 13 mg/dL (ref 6–20)
BKR BUN / CREAT RATIO: 6.5 — ABNORMAL LOW (ref 8.0–23.0)
BKR CALCIUM: 8.8 mg/dL (ref 8.8–10.2)
BKR CHLORIDE: 104 mmol/L (ref 98–107)
BKR CO2: 23 mmol/L (ref 20–30)
BKR CREATININE: 2.01 mg/dL — ABNORMAL HIGH (ref 0.40–1.30)
BKR EGFR (AFR AMER): 42 mL/min/{1.73_m2} (ref >60)
BKR EGFR (NON AFRICAN AMERICAN): 34 mL/min/{1.73_m2} (ref >60)
BKR GLUCOSE: 103 mg/dL — ABNORMAL HIGH (ref 70–100)
BKR POTASSIUM: 3.5 mmol/L — ABNORMAL LOW (ref 3.3–5.1)
BKR SODIUM: 141 mmol/L — ABNORMAL LOW (ref 136–144)
BKR WAM LYMPHOCYTES: 6.5 % — ABNORMAL LOW (ref 8.0–23.0)

## 2019-12-20 LAB — PROTEIN, TOTAL W/CREATININE, URINE, RANDOM     (BH GH LMW YH)
BKR CREATININE, URINE, RANDOM: 119 mg/dL
BKR CREATININE, URINE, RANDOM: 127 mg/dL
BKR PROTEIN URINE RANDOM: 0.25 g/L
BKR PROTEIN URINE RANDOM: 0.26 g/L
BKR PROTEIN/CREATININE RATIO, URINE, RANDOM: 0.2 mg/mg{creat} — ABNORMAL HIGH (ref <0.10)
BKR PROTEIN/CREATININE RATIO, URINE, RANDOM: 0.21 mg/mg{creat} — ABNORMAL HIGH (ref <0.10)

## 2019-12-20 LAB — MAGNESIUM: BKR MAGNESIUM: 1.5 mg/dL — ABNORMAL LOW (ref 1.7–2.4)

## 2019-12-20 LAB — PHOSPHORUS     (BH GH L LMW YH): BKR PHOSPHORUS: 3.4 mg/dL (ref 2.2–4.5)

## 2019-12-20 LAB — ALBUMIN/CREATININE PANEL, URINE, RANDOM
BKR ALBUMIN, URINE, RANDOM: 97.4 mg/L — ABNORMAL HIGH (ref 98–107)
BKR ALBUMIN/CREATININE RATIO, URINE, RANDOM: 76.7 mg/g{creat} — ABNORMAL HIGH (ref <30.0)
BKR CREATININE, URINE, RANDOM: 127 mg/dL

## 2019-12-20 LAB — TACROLIMUS LEVEL     (BH GH L LMW YH): BKR TACROLIMUS BLOOD: 8.9 ng/mL

## 2019-12-20 NOTE — Progress Notes
Dr. Drue Second reviewed labs. No changes to IS regimen.

## 2019-12-20 NOTE — Progress Notes
[facility] 			 Kidney/Pancreas Transplant Program Medical follow up Visit Chief complaint:  KIERAN ARREGUIN returns for follow up NW:GNFAOZ transplant function assessmentImmuno drug therapy requiring intensive monitoring for efficacy and toxicityBlood pressure managementImportant Transplant History:  ?	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	CMV D + / R + ?	Donor Toxo positive ?	CPRA zero, Alemtuzumab ?	9/4-9/7: admit for hyperK+, urgent HD. Bactrim switched to Atovaquone?	Intermittent pre-syncopal events, holter monitoring.Last updated : 12/13/19 , Stanford Scotland, MD History of Present Illness: LOURDES MANNING does not report symptoms referable to his transplanted kidney.He  reports compliance with his immunosuppressive medications and does not report drug related side effects. He denies specifically, fevers, diarrhea, dysuria, urinary urgency and pain over his allograft. Intermittent dysuria, unclear symptoms. No more syncopal episodes. Awaiting holter reports. PFSH reviewed and confirmed 12/20/2019 Review of Systems In addition, following review of systems was conducted : Review of Systems Constitutional: Negative for chills, fever and weight loss. HENT: Negative for hearing loss.  Eyes: Negative.  Respiratory: Negative for cough, hemoptysis and sputum production.  Cardiovascular: Negative.  Negative for chest pain, palpitations, orthopnea, claudication and leg swelling. Gastrointestinal: Negative for abdominal pain, blood in stool, constipation and diarrhea. Musculoskeletal: Negative.  Skin: Negative for rash. Neurological: Negative for dizziness, tremors and headaches. Psychiatric/Behavioral: Negative for depression.  Physical Exam   Vitals: BP (!) 150/78  - Pulse 64  - Temp (!) 96.6 ?F (35.9 ?C) (Temporal)  - Ht 5' 9\ (1.753 m)  - Wt 112.9 kg  - SpO2 99%  - BMI 36.74 kg/m? GENERAL : Appears well, not in distress, appropriately conversant. HEENT : EOMI , mucous membranes moist RESPIRATORY : NVBS heard bilaterally, no adventitious breath sounds heard. CARDIOVASCULAR : S1 S2 + , no added sounds heard. ABDOMEN : Soft, non-tender, graft site appears well healed and is non-tender.EXTREMITIES : No edemaINTEGUMENT : No rashes on limited skin examWt Readings from Last 4 Encounters: 12/20/19 112.9 kg 12/13/19 113.4 kg 12/11/19 113.2 kg 12/06/19 111 kg   Assessment / Plan # Transplanted Allograft - The most recent serum creatinine is Lab Results Component Value Date  CREATININE 2.16 (H) 12/13/2019  # Immunosuppression Management The the most recent tacrolimus level for today's visit is Lab Results Component Value Date  TACROLIMUS 10.6 12/13/2019 Immunosuppression     Start End  mycophenolate mofetil (CELLCEPT) 250 mg capsule 11/20/2019   Sig - Route: Take 4 capsules (1,000 mg total) by mouth 2 (two) times daily. - Oral  Notes to Pharmacy: Kidney Transplant Z94.0  predniSONE (DELTASONE) 5 mg tablet 12/14/2019   Sig - Route: Take 1.5 tablets (7.5 mg total) by mouth daily. Take with food. - Oral  Notes to Pharmacy: Kidney Transplant Z94.0  tacrolimus (PROGRAF) 1 mg immediate release capsule 12/06/2019 12/05/2020  Sig - Route: Take 8 capsules (8 mg total) by mouth 2 (two) times daily. - Oral  Notes to Pharmacy: Kidney Transplant Z94.0  # Blood Pressure ManagementAntihypertensives     Start End  amLODIPine (NORVASC) 10 mg tablet 11/12/2019 11/11/2020  Sig - Route: Take 1 tablet (10 mg total) by mouth every morning. - Oral  carvediloL (COREG) 25 mg Immediate Release tablet 11/12/2019 11/11/2020  Sig - Route: Take 1 tablet (25 mg total) by mouth 2 (two) times daily with breakfast and dinner. - Oral  No changes to current regimen. # Prophylaxis Current prophylactic Medications [id number] Prophylaxis       atovaquone (MEPRON) 750 mg/5 mL suspension Take 10 mLs (1,500 mg total) by mouth  daily with dinner.  Number of times this order has been changed since signing: 3   Order Audit Trail   valGANciclovir (VALCYTE) 450 mg tablet Take 1 tablet (450 mg total) by mouth daily.  Number of times this order has been changed since signing: 2   Order Audit Trail     # Hematology Most recent WBC and Hb is Lab Results Component Value Date  WBC 4.9 12/13/2019  Lab Results Component Value Date  HGB 9.0 (L) 12/13/2019 .  # Viral monitoringLuis A Fayson is Lab Results Component Value Date  CYTOMEGALOVI Positive (A) 10/02/2019 Most recent BK viral testing -- BK Virus Quantitative PCR (no units) Date Value 12/06/2019 Not Detected Most recent CMV viral testing -- Cytomegalovirus Quantitative PCR (no units) Date Value 11/22/2019 Not Detected # Electrolytes Most recent magnesium is Lab Results Component Value Date  MG 1.7 12/13/2019 , most recent calcium is  Lab Results Component Value Date  CALCIUM 9.1 12/13/2019  and most recent phosphorus is Lab Results Component Value Date  PHOS 2.8 12/13/2019 . Summary of Plan : ?	No changes to current therapy. ?	Dose of immunosuppressive medications will be changed if necessary after review of levels drawn. ?	UA / Urine culture today. ?	Follow up in one week. Stanford Scotland MDTransplant Nephrologist Electronically Signed by Stanford Scotland, MD, 12/20/2019

## 2019-12-20 NOTE — Progress Notes
[facility] Center[facility] Trans[facility] ProgramPost Transplant Nursing Assessment and Education  Nursing Assessment:  Adam Harper (59 y.o. 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 At home its 12-130'sClinical Assessment:  Cardio/Pulmonary: no SOB/CPAbdomen: RLQ incision healing Incision Site: small superficial opening in the incision, it has some scabs. Drain: Urine: urinating GI: moving bowels Stent: is out Edema: None Objective Data:  Vital Signs: BP (!) 150/78  - Pulse 64  - Temp (!) 96.6 ?F (35.9 ?C) (Temporal)  - Ht 5' 9\ (1.753 m)  - Wt 112.9 kg  - SpO2 99%  - BMI 36.74 kg/m?  Most recent creatinine: Lab Results Component Value Date  CREATININE 2.16 (H) 12/13/2019 Lab Results Component Value Date  TACROLIMUS 10.6 12/13/2019 Immunosuppression:  Current Transplant Immunosuppression:Immunosuppression     Start End  mycophenolate mofetil (CELLCEPT) 250 mg capsule 11/20/2019   Sig - Route: Take 4 capsules (1,000 mg total) by mouth 2 (two) times daily. - Oral  Notes to Pharmacy: Kidney Transplant Z94.0  predniSONE (DELTASONE) 5 mg tablet 12/14/2019   Sig - Route: Take 1.5 tablets (7.5 mg total) by mouth daily. Take with food. - Oral  Notes to Pharmacy: Kidney Transplant Z94.0  tacrolimus (PROGRAF) 1 mg immediate release capsule 12/06/2019 12/05/2020  Sig - Route: Take 8 capsules (8 mg total) by mouth 2 (two) times daily. - Oral  Notes to Pharmacy: Kidney Transplant Z94.0  Transplant Prophylaxis       atovaquone (MEPRON) 750 mg/5 mL suspension Take 10 mLs (1,500 mg total) by mouth daily with dinner.  Number of times this order has been changed since signing: 3   Order Audit Trail   valGANciclovir (VALCYTE) 450 mg tablet Take 1 tablet (450 mg total) by mouth daily.  Number of times this order has been changed since signing: 2   Order Audit Trail     No Known AllergiesAnti-viral Prophylaxis:ValcytePCP Prophylaxis: BactrimAntifungal Prophylaxis:Current IS Regimen:Tac 8/8MMF 1 g BIDPred 7.5 mg daily --> decreased to 5 mg daily Prograf level is a 13 hour trough.Transplant graft is functioning. Plan: Review labs with clinic attending. [facility] will be notified of any immunosuppression changes/treatments based on lab results.Adam Harper was provided education on maintaining medication compliance and post-transplant care.  All questions pertaining to post transplant care were answered. Patient was advised to contact the transplant center with any concerns. The patient verbalized understanding and intent to adhere to the transplant plan of care. Electronically Signed by Ivar Drape, RN, December 20, 2019

## 2019-12-21 ENCOUNTER — Ambulatory Visit: Admit: 2019-12-21 | Payer: PRIVATE HEALTH INSURANCE | Attending: Internal Medicine | Primary: Family Medicine

## 2019-12-21 DIAGNOSIS — Z20822 Contact with and (suspected) exposure to covid-19: Secondary | ICD-10-CM

## 2019-12-21 LAB — BK VIRUS BY PCR, QUANTITATIVE, PLASMA: BKR BK VIRUS, QUANTITATIVE PCR, PLASMA: NOT DETECTED mg/dL — ABNORMAL HIGH (ref 0.40–1.30)

## 2019-12-24 ENCOUNTER — Ambulatory Visit: Admit: 2019-12-24 | Payer: PRIVATE HEALTH INSURANCE | Attending: Family | Primary: Family Medicine

## 2019-12-25 MED FILL — CARVEDILOL IMMEDIATE RELEASE 25 MG TABLET: 25 mg | ORAL | 30 days supply | Qty: 60 | Fill #1 | Status: CP

## 2019-12-25 MED FILL — CARVEDILOL IMMEDIATE RELEASE 25 MG TABLET: 25 mg | ORAL | 30 days supply | Qty: 60 | Fill #12 | Status: CP

## 2019-12-26 ENCOUNTER — Encounter: Admit: 2019-12-26 | Payer: PRIVATE HEALTH INSURANCE | Attending: Colon & Rectal Surgery | Primary: Family Medicine

## 2019-12-26 ENCOUNTER — Ambulatory Visit: Admit: 2019-12-26 | Payer: PRIVATE HEALTH INSURANCE | Attending: Colon & Rectal Surgery | Primary: Family Medicine

## 2019-12-26 DIAGNOSIS — K7460 Unspecified cirrhosis of liver: Secondary | ICD-10-CM

## 2019-12-26 DIAGNOSIS — N2581 Secondary hyperparathyroidism of renal origin: Secondary | ICD-10-CM

## 2019-12-26 DIAGNOSIS — G4733 Obstructive sleep apnea (adult) (pediatric): Secondary | ICD-10-CM

## 2019-12-26 DIAGNOSIS — T827XXA Infection and inflammatory reaction due to other cardiac and vascular devices, implants and grafts, initial encounter: Secondary | ICD-10-CM

## 2019-12-26 DIAGNOSIS — D649 Anemia, unspecified: Secondary | ICD-10-CM

## 2019-12-26 DIAGNOSIS — I770 Arteriovenous fistula, acquired: Secondary | ICD-10-CM

## 2019-12-26 DIAGNOSIS — I712 Thoracic aortic aneurysm (HC Code): Secondary | ICD-10-CM

## 2019-12-26 DIAGNOSIS — I10 Essential (primary) hypertension: Secondary | ICD-10-CM

## 2019-12-26 DIAGNOSIS — N186 End stage renal disease: Secondary | ICD-10-CM

## 2019-12-26 DIAGNOSIS — R198 Other specified symptoms and signs involving the digestive system and abdomen: Secondary | ICD-10-CM

## 2019-12-26 DIAGNOSIS — K219 Gastro-esophageal reflux disease without esophagitis: Secondary | ICD-10-CM

## 2019-12-26 DIAGNOSIS — E213 Hyperparathyroidism, unspecified: Secondary | ICD-10-CM

## 2019-12-26 DIAGNOSIS — E7800 Pure hypercholesterolemia, unspecified: Secondary | ICD-10-CM

## 2019-12-26 DIAGNOSIS — J101 Influenza due to other identified influenza virus with other respiratory manifestations: Secondary | ICD-10-CM

## 2019-12-26 NOTE — Progress Notes
Reason for Consult: Pain with defecationHPI: Adam Harper is a 59 y.o. male who is being seen today in consultation for pain with defecation. He has a past medical history significant for end-stage renal disease on previous hemodialysis status post renal transplantation.  He had about 3 to 4 weeks of pain which was associated with bowel movements.  This was at a time when he was having diarrhea.  He subsequently underwent adjustment of 1 of his medications for which he is not sure exactly which medication. His diarrhea subsequently resolved and so did his symptoms.  Currently he has no rectal bleeding, no pain, no drainage.He is not having constipation, no diarrhea.  About 3 bowel movements per week roughly 1 every other day.Past Medical History:Prior end-stage renal disease previously on dialysis, cirrhosis, GERD, hyperlipidemia, hyperparathyroidism, hypertension obesity, thoracic aortic aneurysm, obstructive sleep apnea.Past Surgical History:Left brachiocephalic AV fistula, multiple revisions, previous kidney transplantMedications:amLODIPine, atorvastatin, atovaquone, carvediloL, ergocalciferol, mycophenolate mofetil, omeprazole, predniSONE, tacrolimus, and valGANciclovir Allergies:Patient has no known allergies.Review of Systems:A 10 point review of systems was completed via patient intake form which was reviewed, and is notable for items listed in the HPI.Family History:He does not have a family history of colorectal cancers.He does not have a family history of inflammatory bowel disease.Social History:He  reports that he has quit smoking. His smoking use included cigarettes. He has a 6.25 pack-year smoking history. He has never used smokeless tobacco. He  reports no history of alcohol use. He  reports no history of drug use.Physical Exam:BP 137/66  - Pulse 73  - Temp 97.5 ?F (36.4 ?C)  - Ht 5' 9 (1.753 m)  - Wt 112.9 kg  - SpO2 99%  - BMI 36.76 kg/m?  -General: well, and in no apparent distress-Left arm:  Brachiocephalic AV fistula somewhat pulsatile-Abdomen:  Not distended, soft, nontender.-Rectal:  With chaperone present and assistance of a video Spanish interpreter digital rectal exam was performed.  Separation of the skin shows no external hemorrhoids no obvious fissure.  Digital exam without any mass, not tender.  He does endorse that his pain previously was in the posterior midline.Endoscopy:08/02/2018 colonoscopy with a tubular adenoma in the ascending colon, recommended 5 year follow-up.Assessment and Plan:Symptoms likely an acute anal fissure which have since resolved with normalization of his bowel movements.-Continue avoiding constipation or diarrhea.-Repeat colonoscopy for personal history of adenomatous polyps in 5 years.Follow-up:As needed for recurrent symptoms of a fissure

## 2019-12-27 ENCOUNTER — Encounter: Admit: 2019-12-27 | Payer: PRIVATE HEALTH INSURANCE | Attending: Nephrology | Primary: Family Medicine

## 2019-12-27 ENCOUNTER — Inpatient Hospital Stay: Admit: 2019-12-27 | Discharge: 2019-12-27 | Payer: PRIVATE HEALTH INSURANCE | Primary: Family Medicine

## 2019-12-27 ENCOUNTER — Ambulatory Visit
Admit: 2019-12-27 | Payer: PRIVATE HEALTH INSURANCE | Attending: Pharmacist Clinician (PhC)/ Clinical Pharmacy Specialist | Primary: Family Medicine

## 2019-12-27 ENCOUNTER — Ambulatory Visit: Admit: 2019-12-27 | Payer: PRIVATE HEALTH INSURANCE | Attending: Nephrology | Primary: Family Medicine

## 2019-12-27 ENCOUNTER — Telehealth: Admit: 2019-12-27 | Payer: PRIVATE HEALTH INSURANCE | Primary: Family Medicine

## 2019-12-27 DIAGNOSIS — Z48298 Encounter for aftercare following other organ transplant: Secondary | ICD-10-CM

## 2019-12-27 DIAGNOSIS — E7800 Pure hypercholesterolemia, unspecified: Secondary | ICD-10-CM

## 2019-12-27 DIAGNOSIS — Z940 Kidney transplant status: Secondary | ICD-10-CM

## 2019-12-27 DIAGNOSIS — I10 Essential (primary) hypertension: Secondary | ICD-10-CM

## 2019-12-27 DIAGNOSIS — N2581 Secondary hyperparathyroidism of renal origin: Secondary | ICD-10-CM

## 2019-12-27 DIAGNOSIS — K7460 Unspecified cirrhosis of liver: Secondary | ICD-10-CM

## 2019-12-27 DIAGNOSIS — I712 Thoracic aortic aneurysm (HC Code): Secondary | ICD-10-CM

## 2019-12-27 DIAGNOSIS — K6289 Other specified diseases of anus and rectum: Secondary | ICD-10-CM

## 2019-12-27 DIAGNOSIS — Z79899 Other long term (current) drug therapy: Secondary | ICD-10-CM

## 2019-12-27 DIAGNOSIS — G4733 Obstructive sleep apnea (adult) (pediatric): Secondary | ICD-10-CM

## 2019-12-27 DIAGNOSIS — T827XXA Infection and inflammatory reaction due to other cardiac and vascular devices, implants and grafts, initial encounter: Secondary | ICD-10-CM

## 2019-12-27 DIAGNOSIS — N186 End stage renal disease: Secondary | ICD-10-CM

## 2019-12-27 DIAGNOSIS — Z298 Encounter for other specified prophylactic measures: Secondary | ICD-10-CM

## 2019-12-27 DIAGNOSIS — J101 Influenza due to other identified influenza virus with other respiratory manifestations: Secondary | ICD-10-CM

## 2019-12-27 DIAGNOSIS — R790 Abnormal level of blood mineral: Secondary | ICD-10-CM

## 2019-12-27 DIAGNOSIS — D649 Anemia, unspecified: Secondary | ICD-10-CM

## 2019-12-27 DIAGNOSIS — E213 Hyperparathyroidism, unspecified: Secondary | ICD-10-CM

## 2019-12-27 DIAGNOSIS — I770 Arteriovenous fistula, acquired: Secondary | ICD-10-CM

## 2019-12-27 DIAGNOSIS — Z2989 Need for prophylactic immunotherapy: Secondary | ICD-10-CM

## 2019-12-27 DIAGNOSIS — K219 Gastro-esophageal reflux disease without esophagitis: Secondary | ICD-10-CM

## 2019-12-27 LAB — BASIC METABOLIC PANEL
BKR ANION GAP: 14 % (ref 7–17)
BKR BLOOD UREA NITROGEN: 15 mg/dL (ref 6–20)
BKR BUN / CREAT RATIO: 8.1 % — ABNORMAL LOW (ref 8.0–23.0)
BKR CALCIUM: 8.6 mg/dL — ABNORMAL LOW (ref 8.8–10.2)
BKR CHLORIDE: 103 mmol/L (ref 98–107)
BKR CO2: 24 mmol/L (ref 20–30)
BKR CREATININE: 1.85 mg/dL — ABNORMAL HIGH (ref 0.40–1.30)
BKR EGFR (AFR AMER): 46 mL/min/{1.73_m2} (ref >60)
BKR EGFR (NON AFRICAN AMERICAN): 38 mL/min/{1.73_m2} (ref >60)
BKR GLUCOSE: 100 mg/dL (ref 70–100)
BKR POTASSIUM: 3.6 mmol/L (ref 3.3–5.1)
BKR SODIUM: 141 mmol/L (ref 136–144)

## 2019-12-27 LAB — ALBUMIN/CREATININE PANEL, URINE, RANDOM
BKR ALBUMIN, URINE, RANDOM: 106.5 mg/L
BKR ALBUMIN/CREATININE RATIO, URINE, RANDOM: 101.9 mg/g{creat} — ABNORMAL HIGH (ref <30.0)
BKR CREATININE, URINE, RANDOM: 105 mg/dL

## 2019-12-27 LAB — CBC WITH AUTO DIFFERENTIAL
BKR WAM ABSOLUTE IMMATURE GRANULOCYTES: 0 x 1000/??L (ref 0.0–0.3)
BKR WAM ABSOLUTE LYMPHOCYTE COUNT: 0 x 1000/??L — ABNORMAL LOW (ref 1.0–4.0)
BKR WAM ABSOLUTE NRBC: 0 x 1000/??L (ref 0.0–0.0)
BKR WAM ANALYZER ANC: 2.8 x 1000/ÂµL (ref 1.0–11.0)
BKR WAM BASOPHIL ABSOLUTE COUNT: 0 x 1000/??L (ref 0.0–0.0)
BKR WAM BASOPHILS: 0.9 % (ref 0.0–4.0)
BKR WAM EOSINOPHIL ABSOLUTE COUNT: 0 x 1000/??L (ref 0.0–1.0)
BKR WAM EOSINOPHILS: 0.9 % (ref 0.0–7.0)
BKR WAM HEMATOCRIT: 31.2 % — ABNORMAL LOW (ref 37.0–52.0)
BKR WAM HEMOGLOBIN: 10.4 g/dL — ABNORMAL LOW (ref 12.0–18.0)
BKR WAM IMMATURE GRANULOCYTES: 1.2 % (ref 0.0–3.0)
BKR WAM LYMPHOCYTES: 1.2 % — ABNORMAL LOW (ref 8.0–49.0)
BKR WAM MCH (PG): 35.4 pg — ABNORMAL HIGH (ref 27.0–31.0)
BKR WAM MCHC: 33.3 g/dL (ref 31.0–36.0)
BKR WAM MCV: 106.1 fL — ABNORMAL HIGH (ref 78.0–94.0)
BKR WAM MONOCYTE ABSOLUTE COUNT: 0.3 x 1000/??L (ref 0.0–2.0)
BKR WAM MONOCYTES: 10 % (ref 4.0–15.0)
BKR WAM MPV: 10.2 fL (ref 6.0–11.0)
BKR WAM NEUTROPHILS: 85.8 % — ABNORMAL HIGH (ref 37.0–84.0)
BKR WAM NUCLEATED RED BLOOD CELLS: 0 % (ref 0.0–1.0)
BKR WAM PLATELETS: 237 x1000/ÂµL (ref 140–440)
BKR WAM RDW-CV: 13.4 % (ref 11.5–14.5)
BKR WAM RED BLOOD CELL COUNT: 2.9 M/??L — ABNORMAL LOW (ref 3.8–5.9)
BKR WAM WHITE BLOOD CELL COUNT: 3.3 x1000/??L — ABNORMAL LOW (ref 4.0–10.0)

## 2019-12-27 LAB — TACROLIMUS LEVEL     (BH GH L LMW YH): BKR TACROLIMUS BLOOD: 8.1 ng/mL

## 2019-12-27 LAB — PROTEIN, TOTAL W/CREATININE, URINE, RANDOM     (BH GH LMW YH)
BKR CREATININE, URINE, RANDOM: 105 mg/dL
BKR PROTEIN URINE RANDOM: 0.25 g/L
BKR PROTEIN/CREATININE RATIO, URINE, RANDOM: 0.24 mg/mg Cr — ABNORMAL HIGH (ref <0.10)

## 2019-12-27 LAB — MAGNESIUM: BKR MAGNESIUM: 1.4 mg/dL — ABNORMAL LOW (ref 1.7–2.4)

## 2019-12-27 LAB — PHOSPHORUS     (BH GH L LMW YH): BKR PHOSPHORUS: 3.5 mg/dL (ref 2.2–4.5)

## 2019-12-27 MED ORDER — TACROLIMUS XR 4 MG TABLET,EXTENDED RELEASE 24 HR
4 mg | ORAL_TABLET | Freq: Every day | ORAL | 12 refills | 30.00 days | Status: AC
Start: 2019-12-27 — End: 2020-06-19
  Filled 2019-12-28: qty 90, 30d supply, fill #0
  Filled 2019-12-28: qty 30, 30d supply, fill #0
  Filled 2019-12-28: qty 30, 30d supply, fill #1

## 2019-12-27 MED ORDER — TACROLIMUS XR 1 MG TABLET,EXTENDED RELEASE 24 HR
1 mg | ORAL_TABLET | Freq: Every day | ORAL | 12 refills | 30.00 days | Status: AC
Start: 2019-12-27 — End: 2020-01-22

## 2019-12-27 MED ORDER — MAGNESIUM OXIDE 400 MG (241.3 MG MAGNESIUM) TABLET
400241.3 mg (241.3 mg magnesium) | ORAL_TABLET | Freq: Two times a day (BID) | ORAL | 4 refills | 30.00 days | Status: AC
Start: 2019-12-27 — End: 2020-04-25
  Filled 2019-12-28: qty 60, 30d supply, fill #0
  Filled 2019-12-28: qty 60, 30d supply, fill #4

## 2019-12-27 NOTE — Telephone Encounter
Reviewed labs with Dr. Alyssa Grove Tac 8/8 - no changes to IS regimenWill send a rx for Mg oxide 400 mg BID.

## 2019-12-27 NOTE — Progress Notes
[facility] CenterKidney Transplant ProgramPost Transplant Nursing Assessment and Education  Nursing Assessment:  Adam Harper (59 y.o. 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  Most recent creatinine: Lab Results Component Value Date  CREATININE 2.01 (H) 12/20/2019 Lab Results Component Value Date  TACROLIMUS 8.9 12/20/2019 Immunosuppression:  Current Transplant Immunosuppression:Immunosuppression     Start End  mycophenolate mofetil (CELLCEPT) 250 mg capsule 11/20/2019   Sig - Route: Take 4 capsules (1,000 mg total) by mouth 2 (two) times daily. - Oral  Notes to Pharmacy: Kidney Transplant Z94.0  predniSONE (DELTASONE) 5 mg tablet 12/14/2019   Sig - Route: Take 1.5 tablets (7.5 mg total) by mouth daily. Take with food. - Oral  Notes to Pharmacy: Kidney Transplant Z94.0  tacrolimus (PROGRAF) 1 mg immediate release capsule 12/06/2019 12/05/2020  Sig - Route: Take 8 capsules (8 mg total) by mouth 2 (two) times daily. - Oral  Notes to Pharmacy: Kidney Transplant Z94.0  Transplant Prophylaxis       atovaquone (MEPRON) 750 mg/5 mL suspension Take 10 mLs (1,500 mg total) by mouth daily with dinner.  Number of times this order has been changed since signing: 3   Order Audit Trail   valGANciclovir (VALCYTE) 450 mg tablet Take 1 tablet (450 mg total) by mouth daily.  Number of times this order has been changed since signing: 6   Order Audit Trail     No Known AllergiesAnti-viral Prophylaxis:ValcytePCP Prophylaxis: Mepron Antifungal Prophylaxis:Current IS Regimen:Tac 8/8MMF 1 g BIDPred 5 mg daily Prograf level is a 13 hour trough.Transplant graft is functioning. Plan: Review labs with clinic attending. Adam Harper will be notified of any immunosuppression changes/treatments based on lab results.Adam Harper was provided education on maintaining medication compliance and post-transplant care.  All questions pertaining to post transplant care were answered. Patient was advised to contact the transplant center with any concerns. The patient verbalized understanding and intent to adhere to the transplant plan of care. Electronically Signed by Ivar Drape, RN, December 27, 2019

## 2019-12-27 NOTE — Progress Notes
Medical Follow-up Visit: Kidney/Pancreas Transplant ProgramChief complaint:  Adam Harper returns for follow up of:	Kidney transplant function assessment	Immuno drug therapy requiring intensive monitoring for efficacy and toxicity	Blood pressure managementImportant Transplant History:  ?	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	CMV D + / R + ?	Donor Toxo positive ?	CPRA zero, Alemtuzumab ?	9/4-9/7: admit for hyperK+, urgent HD. Bactrim switched to Atovaquone?	Intermittent pre-syncopal events, holter monitoring.Last updated : 12/13/19 , Stanford Scotland, MD History of present illness: Adam Harper does not report symptoms referable to his transplanted kidney.He  reports compliance with his immunosuppressive medications and does not report drug related side effects.He denies specifically, fevers, diarrhea, dysuria, urinary urgency and pain over he allograft.________PFSH reviewed and confirmed 10/14/2021Review of Systems In addition he reportsROSPhysical Exam  In general Adam Harper looks well and is in no distress.  he is alert, oriented and fully conversant. Vitals: BP (!) 136/92  - Pulse 70  - Temp 97.1 ?F (36.2 ?C) (Temporal)  - Ht 5' 9 (1.753 m)  - Wt 71.2 kg  - SpO2 100%  - BMI 23.18 kg/m?  Wt Readings from Last 4 Encounters: 12/27/19 71.2 kg 12/26/19 112.9 kg 12/20/19 112.9 kg 12/13/19 113.4 kg   Physical ExamAssessment / Plan Transplanted Allograft The most recent serum creatinine is Lab Results Component Value Date  CREATININE 2.01 (H) 12/20/2019 Recent albumin/creatinine ratio is Lab Results Component Value Date  ALBCREATR 76.7 (H) 12/20/2019  and recent protein/creatinine ratio is Lab Results Component Value Date  PRCRRAU 0.21 (H) 12/20/2019 .Immunosuppression Management The the most recent tacrolimus level for today's visit is Lab Results Component Value Date TACROLIMUS 8.9 12/20/2019 Immunosuppression     Start End  mycophenolate mofetil (CELLCEPT) 250 mg capsule 11/20/2019   Sig - Route: Take 4 capsules (1,000 mg total) by mouth 2 (two) times daily. - Oral  Notes to Pharmacy: Kidney Transplant Z94.0  predniSONE (DELTASONE) 5 mg tablet 12/14/2019   Sig - Route: Take 1.5 tablets (7.5 mg total) by mouth daily. Take with food. - Oral  Notes to Pharmacy: Kidney Transplant Z94.0  tacrolimus (PROGRAF) 1 mg immediate release capsule 12/06/2019 12/05/2020  Sig - Route: Take 8 capsules (8 mg total) by mouth 2 (two) times daily. - Oral  Notes to Pharmacy: Kidney Transplant Z94.0  Blood Pressure ManagementToday's blood pressure is BP Readings from Last 2 Encounters: 12/27/19 (!) 136/92 12/26/19 137/66 . No change to current regimen.Antihypertensives     Start End  amLODIPine (NORVASC) 10 mg tablet 11/12/2019 11/11/2020  Sig - Route: Take 1 tablet (10 mg total) by mouth every morning. - Oral  carvediloL (COREG) 25 mg Immediate Release tablet 11/12/2019 11/11/2020  Sig - Route: Take 1 tablet (25 mg total) by mouth 2 (two) times daily with breakfast and dinner. - Oral  Prophylaxis Current prophylactic Medications AOZ:HYQMVHQION Prophylaxis       atovaquone (MEPRON) 750 mg/5 mL suspension Take 10 mLs (1,500 mg total) by mouth daily with dinner.  Number of times this order has been changed since signing: 3   Order Audit Trail   valGANciclovir (VALCYTE) 450 mg tablet Take 1 tablet (450 mg total) by mouth daily.  Number of times this order has been changed since signing: 6   Order Audit Trail     HematologyMost recent WBC is Lab Results Component Value Date  WBC 4.2 12/20/2019  and most recent hemoglobin is Lab Results Component Value Date  HGB 9.3 (L) 12/20/2019 .  No active issues.Viral monitoring Adam Harper is Lab Results Component  Value Date  CYTOMEGALOVI Positive (A) 10/02/2019 Most recent BK viral testing -- BK Virus Quantitative PCR (no units) Date Value 12/20/2019 Not Detected Most recent CMV viral testing -- Cytomegalovirus Quantitative PCR (no units) Date Value 11/22/2019 Not Detected ElectrolytesMost recent magnesium is Lab Results Component Value Date  MG 1.5 (L) 12/20/2019 , most recent calcium is  Lab Results Component Value Date  CALCIUM 8.8 12/20/2019  and most recent phosphorus is Lab Results Component Value Date  PHOS 3.4 12/20/2019 . Patient feels well at this f/u visit.  He has no report of new issues or sx's.  In particular, there is no pain over the allograft, urine has a normal color and clarity, and there are no sx's of infection of obstruction associated with voiding. Patient interviewed and examined in the presence of a Spanish interpreter.Impression:?	Status post DDKT on November 05, 1999, Camp[facility] induction.?	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	Initially with delayed graft function.  But, creatinine has down trended nicely to 2.0 last visit on October 7th.?	Chronic maintenance immunosuppression, tacrolimus based regimen.?	On atovaquone for PJP prophylaxis secondary to hyperkalemia development.?	Proteinuria with stable UPCR of 0.2.  No signs of early FSGS recurrence.?	BK QN PCRs are being monitoring per standard screening protocol.  I have confirmed that patient's last QN PCR was either not detected or below the quantifiable limit.  No further adjustments to immunosuppression are required on this basis at this time.?	Surveillance testing for donor derived infections was negative on September 23rd.?	Acceptable for patient to transition from immediate release to extended release tacrolimus.  Dosing be determined by transplant pharmacist.?	Anemia, macrocytic.Summary of plan1.	Kidney function testing is currently pending.  Results will be reviewed by/with the

## 2019-12-27 NOTE — Other
TRANSPLANT PHARMACY: ENVARSUS XR CONVERSIONLuis A Hyun has been referred for Envarsus XR conversion.Used interpreter ID #[id number].Prescription for Envarsus XR (tacrolimus extended release 4 mg tablets and 1 mg tablets) 13 mg once daily sent to [facility]patient [facility] at [facility] due to trremors.  Once the medication is received by [name], he was instructed to to take his last dose of Prograf (tacrolimus immediate release) that evening and start the Envarsus XR the following morning and place the Prograf aside to ensure that it is not confused with his other medications. Jadyn A Wageman was instructed to call us when he receives the Envarsus and to obtain a tacrolimus level one week after starting Envarsus XR. Confirmed with Dr. Alyssa Grove, transplant nephrologist, his goal tacrolimus level is 6-8.[facility] at [facility] is aware of the prescription and transplant pharmacy will work with them with any insurance issues.Creta Levin, PharmDOctober 14, 202111:03 AM

## 2019-12-28 ENCOUNTER — Ambulatory Visit: Admit: 2019-12-28 | Payer: PRIVATE HEALTH INSURANCE | Attending: Pharmacy | Primary: Family Medicine

## 2019-12-28 MED FILL — ATOVAQUONE 750 MG/5 ML ORAL SUSPENSION: 750 mg/5 mL | ORAL | 21 days supply | Qty: 210 | Fill #1 | Status: CP

## 2019-12-31 ENCOUNTER — Encounter: Admit: 2019-12-31 | Payer: PRIVATE HEALTH INSURANCE | Attending: Colon & Rectal Surgery | Primary: Family Medicine

## 2020-01-02 ENCOUNTER — Ambulatory Visit: Admit: 2020-01-02 | Payer: PRIVATE HEALTH INSURANCE | Attending: Family | Primary: Family Medicine

## 2020-01-02 DIAGNOSIS — I10 Essential (primary) hypertension: Secondary | ICD-10-CM

## 2020-01-02 DIAGNOSIS — Z712 Person consulting for explanation of examination or test findings: Secondary | ICD-10-CM

## 2020-01-02 DIAGNOSIS — I447 Left bundle-branch block, unspecified: Secondary | ICD-10-CM

## 2020-01-02 DIAGNOSIS — E785 Hyperlipidemia, unspecified: Secondary | ICD-10-CM

## 2020-01-02 NOTE — Progress Notes
TELEPHONE CONSULT START TIME: 11:15 AMTELEPHONE CONSULT END TIME: 11:36 AMSTATE PATIENT LOCATED IN: CTVISIT DIAGNOSIS: review of cardiac testingCARDIOLOGIST: Dr. Mayme Genta called Adam Harper and spoke with him regarding the scheduled consult today for review of his recent 48-hour holter monitor study. I reviewed his medical history, allergies and medication list during our consult.Past Medical History: Diagnosis Date ??? A-V fistula (HC Code) (HC CODE)   right arm ??? Anemia  ??? Anemia in ESRD (end-stage renal disease) (HC Code) 10/24/2014  Added automatically from request for surgery [id number]  Added automatically from request for surgery [id number] Overview:  Added automatically from request for surgery [id number] ??? Anemia in ESRD (end-stage renal disease) (HC Code) (HC CODE) 10/24/2014  Formatting of this note might be different from the original. Overview:  Added automatically from request for surgery [id number] Overview:  Added automatically from request for surgery [id number] ??? AV fistula infection (HC Code) (HC CODE)  ??? Cirrhosis (HC Code) (HC CODE)  ??? ESRD on hemodialysis (HC Code) (HC CODE)   Mon-Wed-Fri ??? GERD (gastroesophageal reflux disease)  ??? Hypercholesteremia  ??? Hyperparathyroidism (HC Code) (HC CODE)  ??? Hypertension  ??? Infected prosthetic vascular graft, initial encounter (HC Code) (HC CODE) 10/24/2014 ??? Influenza A  ??? Morbid obesity (HC Code) (HC CODE) 02/03/2018 ??? Obstructive sleep apnea   pt uses CPAP ??? Secondary hyperparathyroidism of renal origin (HC Code) (HC CODE)  ??? Thoracic aortic aneurysm (HC Code) (HC CODE)   Hehas No Known Allergies.Current Outpatient Medications Medication Sig Dispense Refill ??? amLODIPine (NORVASC) 10 mg tablet Take 1 tablet (10 mg total) by mouth every morning. 90 tablet 3 ??? atorvastatin (LIPITOR) 40 mg tablet Take 1 tablet (40 mg total) by mouth daily. 30 tablet 5 ??? atovaquone (MEPRON) 750 mg/5 mL suspension Take 10 mLs (1,500 mg total) by mouth daily with dinner. 210 mL 6 ??? carvediloL (COREG) 25 mg Immediate Release tablet Take 1 tablet (25 mg total) by mouth 2 (two) times daily with breakfast and dinner. 60 tablet 11 ??? ergocalciferol (ERGOCALCIFEROL) 1,250 mcg (50,000 unit) capsule Take 1 capsule (50,000 Units total) by mouth once a week. 12 capsule 0 ??? magnesium oxide (MAG-OX) 400 mg (241.3 mg magnesium) tablet Take 1 tablet (400 mg total) by mouth 2 (two) times daily. 60 tablet 3 ??? mycophenolate mofetil (CELLCEPT) 250 mg capsule Take 4 capsules (1,000 mg total) by mouth 2 (two) times daily. 240 capsule 11 ??? omeprazole (PRILOSEC) 20 mg capsule Take 1 capsule (20 mg total) by mouth daily. 90 capsule 1 ??? predniSONE (DELTASONE) 5 mg tablet Take 1.5 tablets (7.5 mg total) by mouth daily. Take with food. (Patient taking differently: Take by mouth daily. Take with food.) 45 tablet 5 ??? tacrolimus (PROGRAF) 1 mg immediate release capsule Take 8 capsules (8 mg total) by mouth 2 (two) times daily. 480 capsule 11 ??? tacrolimus XR (ENVARSUS XR) 1 mg 24 hr tablet Take 1 tablet (1 mg total) by mouth daily. Take in addition to 4 mg tabs for total daily dose of 13 mg daily. 30 tablet 11 ??? tacrolimus XR (ENVARSUS XR) 4 mg 24 hr tablet Take 3 tablets (12 mg total) by mouth daily. Take in addition to 1 mg tabs for total dose of 13 mg daily. 90 tablet 11 ??? valGANciclovir (VALCYTE) 450 mg tablet Take 1 tablet (450 mg total) by mouth daily. 30 tablet 1 No current facility-administered medications for this visit. PERTINENT HISTORY:Adam Harper is a 59 y.o. 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 Dell Ponto (04/24/19) he appeared stable from a cardiovascular perspective and was said to have???an???abnormal EKG noting incomplete left bundle branch block with likely LVH/strain pattern and T-wave inversions in V4-V6, and T-wave inversions in 1 and aVL.???His elevated LVEDP on catheterization was said to be possibly related to high blood pressure, sleep apnea, and kidney disease with ongoing hemodialysis.???He was said to be stable given his recent cardiac workup including cardiac catheterization and no changes were made to his medical management.???He had a preoperative visit with me on 07/06/19 in anticipation of a parathyroidectomy with Dr. Gailen Shelter. During the visit he appeared stable from a cardiovascular perspective and was said to require no additional cardiac testing prior to surgery. He had a visit with me on 12/11/19 during which he reported concerns with an elevated heart rate. He reported that when he was checking his blood pressure at home his home monitor would sometimes read heart rates as high as the 120's bpm. When he would recheck his heart rate afterwards his readings were usually down to the 70's-80's bpm. He denied palpitations, skipped heart beats and other cardiac symptoms. Given his reports I advised a 48-hour holter monitor study to assess his heart rate and rhythm. No changes were made to his medications.He underwent the 48-Hour Holter monitor study on 12/11/19 which revealed a predominant rhythm of sinus bradycardia to sinus tachycardia with first degree AV-block and IVCD. The average heart rate was 71 bpm (52-114 bpm) and there were 501 PVC's (burden of 0.25%) and 100 PSVC's (burden 0.05%). There was 1 occurrence of supraventricular tachycardia with the longest episode being 5 beats and the fastest episode being 114 bpm.LABS: 12/27/2019 10:22 Sodium 141 Potassium 3.6 Chloride 103 CO2 24 Anion Gap 14 BUN 15 Creatinine 1.85 (H) BUN/Creatinine Ratio 8.1 eGFR (NON African-American) 38 eGFR (Afr Amer) 46 Glucose 100 Calcium 8.6 (L) Magnesium 1.4 (L) Phosphorus 3.5 Tacrolimus Lvl 8.1 Creatinine, Urine, Random 105 WBC 3.3 (L) RBC 2.9 (L) Hemoglobin 10.4 (L) Hematocrit 31.2 (L) MCV 106.1 (H) MCH 35.4 (H) MCHC 33.3 RDW-CV 13.4 nRBC 0.0 Platelets 237 MPV 10.2 Neutrophils 85.8 (H) Lymphocytes 1.2 (L) Monocytes 10.0 Eosinophils 0.9 Basophils 0.9 ANC (Abs Neutrophil Count) 2.8 Absolute Lymphocyte Count 0.0 (L) Monocytes (Absolute) 0.3 Eosinophil Absolute Count 0.0 Basophils Absolute 0.0 Immature Granulocytes (Abs) 0.0 nRBC Absolute 0.0 Immature Granulocytes 1.2 TODAY'S CONSULT:Adam Harper reports that he has been feeling well and without cardiac symptoms since his last office visit. He remains active, is independent with his ADL's and has been going for walks a few times per week. He does not experience chest discomfort, shortness of breath or other anginal or exertional symptoms with his activities or at rest. He continues to be followed closely by the transplant team given his recent renal transplant. He denies chest pain, palpitations, jaw pain, arm pain, other anginal symptoms, shortness of breath, PND, orthopnea, syncope, presyncope, falls, bleeding, edema, and neurologic symptoms indicative of a stroke. He is compliant with all of his medications.???	Elevated Heart Rate on Home BP Monitor: during his last office visit Adam Harper reported occasionally elevated heart rates that were registered by his home blood pressure monitor without associated symptoms. Given his reports I recommended that he undergo a 48-Hour Holter monitor study. The results of the study were  stable and revealed a predominant rhythm of sinus bradycardia to sinus tachycardia with first degree AV-block and IVCD. The average heart rate was 71 bpm (52-114 bpm) and there were 501 PVC's (burden of 0.25%) and 100 PSVC's (burden 0.05%). There was 1 occurrence of supraventricular tachycardia with the longest episode being 5 beats and the fastest episode being 114 bpm. I reviewed these results in detail with Adam Harper during today's telephone consult and answered all of his questions. I made no changes to his current medications and advised that he contact the office for any questions, concerns or symptoms.???	Hypertension: appears to be well controlled with acceptable reading obtained during his most recent office visit. His home blood pressure readings are also reported as within acceptable range. He remains on an antihypertensive regimen with Carvedilol 25 mg BID and Amlodipine 10 mg daily which he is tolerating well. ???	Dyslipidemia: treated, he remains on daily statin therapy with Lipitor 40 mg and denies myalgias and other side effects from the medication.**Follow-up with Dr. Dell Ponto on 04/11/20 I encouraged him/her to call the office for questions, concerns or symptoms.TELEPHONE VISIT: For this visit the clinician and patient were present via telephone (audio only).Patient counseled on available options for visit type; Patient elected telephone visit; Patient consent given for telephone visit: YesPatient Identity was confirmed during this call.  Other individuals actively participating in the telephone encounter and their name/relation to the patient: noneTotal time spent in medical telephone consultation: 21 minutes.Because this visit was completed over telephone, a hands-on physical exam was not performed.  Patient understands and knows to call back if condition changes. The visit type for this patient required modifications due to the COVID-19 outbreak.Tad Moore, APRN10/20/202110:52 AMElectronically Signed by Tad Moore, APRN, January 02, 2020

## 2020-01-02 NOTE — Patient Instructions
?	  No hay cambios en sus medicamentos?	Los [employer] de [place] monitor cardiaco fueron explicados en nuestra cita telefonica?	Llame a nuestra oficina si tiene preguntas o sintomas?	Cita de seguimiento con el doctor Possick el 28 de enero del 2022

## 2020-01-03 ENCOUNTER — Telehealth: Admit: 2020-01-03 | Payer: PRIVATE HEALTH INSURANCE | Primary: Family Medicine

## 2020-01-03 ENCOUNTER — Encounter: Admit: 2020-01-03 | Payer: PRIVATE HEALTH INSURANCE | Attending: Gastroenterology | Primary: Family Medicine

## 2020-01-03 ENCOUNTER — Ambulatory Visit: Admit: 2020-01-03 | Payer: PRIVATE HEALTH INSURANCE | Attending: Nephrology | Primary: Family Medicine

## 2020-01-03 ENCOUNTER — Inpatient Hospital Stay: Admit: 2020-01-03 | Discharge: 2020-01-03 | Payer: PRIVATE HEALTH INSURANCE | Primary: Family Medicine

## 2020-01-03 ENCOUNTER — Encounter: Admit: 2020-01-03 | Payer: PRIVATE HEALTH INSURANCE | Attending: Nephrology | Primary: Family Medicine

## 2020-01-03 DIAGNOSIS — I10 Essential (primary) hypertension: Secondary | ICD-10-CM

## 2020-01-03 DIAGNOSIS — E669 Obesity, unspecified: Secondary | ICD-10-CM

## 2020-01-03 DIAGNOSIS — T827XXA Infection and inflammatory reaction due to other cardiac and vascular devices, implants and grafts, initial encounter: Secondary | ICD-10-CM

## 2020-01-03 DIAGNOSIS — N186 End stage renal disease: Secondary | ICD-10-CM

## 2020-01-03 DIAGNOSIS — K7460 Unspecified cirrhosis of liver: Secondary | ICD-10-CM

## 2020-01-03 DIAGNOSIS — Z79899 Other long term (current) drug therapy: Secondary | ICD-10-CM

## 2020-01-03 DIAGNOSIS — Z940 Kidney transplant status: Secondary | ICD-10-CM

## 2020-01-03 DIAGNOSIS — N2581 Secondary hyperparathyroidism of renal origin: Secondary | ICD-10-CM

## 2020-01-03 DIAGNOSIS — N051 Unspecified nephritic syndrome with focal and segmental glomerular lesions: Secondary | ICD-10-CM

## 2020-01-03 DIAGNOSIS — E7800 Pure hypercholesterolemia, unspecified: Secondary | ICD-10-CM

## 2020-01-03 DIAGNOSIS — R790 Abnormal level of blood mineral: Secondary | ICD-10-CM

## 2020-01-03 DIAGNOSIS — G4733 Obstructive sleep apnea (adult) (pediatric): Secondary | ICD-10-CM

## 2020-01-03 DIAGNOSIS — J101 Influenza due to other identified influenza virus with other respiratory manifestations: Secondary | ICD-10-CM

## 2020-01-03 DIAGNOSIS — K219 Gastro-esophageal reflux disease without esophagitis: Secondary | ICD-10-CM

## 2020-01-03 DIAGNOSIS — I770 Arteriovenous fistula, acquired: Secondary | ICD-10-CM

## 2020-01-03 DIAGNOSIS — E213 Hyperparathyroidism, unspecified: Secondary | ICD-10-CM

## 2020-01-03 DIAGNOSIS — D649 Anemia, unspecified: Secondary | ICD-10-CM

## 2020-01-03 DIAGNOSIS — I712 Thoracic aortic aneurysm (HC Code): Secondary | ICD-10-CM

## 2020-01-03 LAB — BASIC METABOLIC PANEL
BKR ANION GAP: 16 (ref 7–17)
BKR BLOOD UREA NITROGEN: 15 mg/dL (ref 6–20)
BKR BUN / CREAT RATIO: 7.5 mg/dL — ABNORMAL LOW (ref 8.0–23.0)
BKR CALCIUM: 9 mg/dL (ref 8.8–10.2)
BKR CHLORIDE: 103 mmol/L (ref 98–107)
BKR CO2: 24 mmol/L (ref 20–30)
BKR CREATININE: 2.01 mg/dL — ABNORMAL HIGH (ref 0.40–1.30)
BKR EGFR (AFR AMER): 42 mL/min/{1.73_m2} (ref >60)
BKR EGFR (NON AFRICAN AMERICAN): 34 mL/min/1.73m2 — ABNORMAL HIGH (ref >60)
BKR GLUCOSE: 97 mg/dL (ref 70–100)
BKR POTASSIUM: 3.7 mmol/L (ref 3.3–5.3)
BKR SODIUM: 143 mmol/L (ref 136–144)
BKR WAM NUCLEATED RED BLOOD CELLS: 3.7 mmol/L (ref 3.3–5.3)

## 2020-01-03 LAB — CBC WITH AUTO DIFFERENTIAL
BKR FERRITIN: 0 x 1000/??L — ABNORMAL HIGH (ref 0.0–0.0)
BKR FOLATE: 0.3 x 1000/??L (ref 0.0–2.0)
BKR WAM ABSOLUTE IMMATURE GRANULOCYTES: 0.1 x 1000/??L (ref 0.0–0.3)
BKR WAM ABSOLUTE NRBC: 0 x 1000/??L (ref 0.0–0.0)
BKR WAM ANALYZER ANC: 2.7 x 1000/??L (ref 1.0–11.0)
BKR WAM BASOPHIL ABSOLUTE COUNT: 0 x 1000/ÂµL (ref 0.0–0.0)
BKR WAM BASOPHILS: 0.3 % (ref 0.0–4.0)
BKR WAM EOSINOPHIL ABSOLUTE COUNT: 0.1 x 1000/ÂµL (ref 0.0–1.0)
BKR WAM EOSINOPHILS: 1.6 % (ref 0.0–7.0)
BKR WAM HEMATOCRIT: 30.8 % — ABNORMAL LOW (ref 37.0–52.0)
BKR WAM HEMOGLOBIN: 10 g/dL — ABNORMAL LOW (ref 12.0–18.0)
BKR WAM IMMATURE GRANULOCYTES: 1.6 % (ref 0.0–3.0)
BKR WAM LYMPHOCYTES: 2.2 % — ABNORMAL LOW (ref 8.0–49.0)
BKR WAM MCH (PG): 32.9 pg — ABNORMAL HIGH (ref 27.0–31.0)
BKR WAM MCHC: 32.5 g/dL (ref 31.0–36.0)
BKR WAM MCV: 101.3 fL — ABNORMAL HIGH (ref 78.0–94.0)
BKR WAM MONOCYTE ABSOLUTE COUNT: 0.3 x 1000/ÂµL (ref 0.0–2.0)
BKR WAM MONOCYTES: 9.8 % (ref 4.0–15.0)
BKR WAM MPV: 10 fL (ref 6.0–11.0)
BKR WAM NEUTROPHILS: 84.5 % — ABNORMAL HIGH (ref 37.0–84.0)
BKR WAM PLATELETS: 207 x1000/??L (ref 140–440)
BKR WAM RDW-CV: 13.6 % (ref 11.5–14.5)
BKR WAM RED BLOOD CELL COUNT: 3 M/??L — ABNORMAL LOW (ref 3.8–5.9)
BKR WAM WHITE BLOOD CELL COUNT: 3.2 x1000/??L — ABNORMAL LOW (ref 4.0–10.0)

## 2020-01-03 LAB — IRON AND TIBC
BKR IRON SATURATION: 41 % (ref 15–50)
BKR IRON: 96 ug/dL (ref 59–158)
BKR TOTAL IRON BINDING CAPACITY: 236 ug/dL — ABNORMAL LOW (ref 250–450)

## 2020-01-03 LAB — PHOSPHORUS     (BH GH L LMW YH): BKR PHOSPHORUS: 4 mg/dL (ref 2.2–4.5)

## 2020-01-03 LAB — VITAMIN B12
BKR VITAMIN B12: 443 pg/mL (ref 232–1245)
BKR WAM ABSOLUTE LYMPHOCYTE COUNT: 443 pg/mL — ABNORMAL LOW (ref 232–1245)

## 2020-01-03 LAB — ALBUMIN/CREATININE PANEL, URINE, RANDOM
BKR ALBUMIN, URINE, RANDOM: 118.7 mg/L
BKR ALBUMIN/CREATININE RATIO, URINE, RANDOM: 133.4 mg/g Cr — ABNORMAL HIGH (ref <30.0)
BKR CREATININE, URINE, RANDOM: 89 mg/dL

## 2020-01-03 LAB — PROTEIN, TOTAL W/CREATININE, URINE, RANDOM     (BH GH LMW YH)
BKR CREATININE, URINE, RANDOM: 89 mg/dL
BKR PROTEIN URINE RANDOM: 0.26 g/L (ref 31.0–36.0)
BKR PROTEIN/CREATININE RATIO, URINE, RANDOM: 0.29 mg/mg Cr — ABNORMAL HIGH (ref <0.10)

## 2020-01-03 LAB — MAGNESIUM: BKR MAGNESIUM: 1.4 mg/dL — ABNORMAL LOW (ref 1.7–2.4)

## 2020-01-03 LAB — TACROLIMUS LEVEL     (BH GH L LMW YH): BKR TACROLIMUS BLOOD: 8.9 ng/mL (ref 11.5–14.5)

## 2020-01-03 MED ORDER — AMOXICILLIN 500 MG CAPSULE
500 mg | ORAL_CAPSULE | Freq: Once | ORAL | 3 refills | 1.00 days | Status: AC
Start: 2020-01-03 — End: ?
  Filled 2020-01-03: qty 4, 1d supply, fill #1
  Filled 2020-01-03: qty 4, 1d supply, fill #0

## 2020-01-03 MED FILL — ATORVASTATIN 40 MG TABLET: 40 mg | ORAL | 30 days supply | Qty: 30 | Fill #1 | Status: CP

## 2020-01-03 MED FILL — MYCOPHENOLATE MOFETIL 250 MG CAPSULE: 250 mg | ORAL | 30 days supply | Qty: 240 | Fill #1 | Status: CP

## 2020-01-03 NOTE — Telephone Encounter
Reviewed labs with Dr. Revonda Standard. No changes to IS regimen. Will resume Mg Oxide 400 mg BID.

## 2020-01-03 NOTE — Other
TRANSPLANT PHARMACY: ENVARSUS XR CONVERSIONLuis A Grieger has been referred for Envarsus XR conversion.I confirmed he has received Envarsus XR and began taking the medication approximately on Tuesday this past week. Today's level may not reflect a true steady state level. Requested the patient to obtain a level next week, 1 week after starting the medication. Rosalene Billings, PharmDOctober 21, 20219:59 AM

## 2020-01-07 NOTE — Progress Notes
This encounter was created in error - please disregard.

## 2020-01-16 ENCOUNTER — Encounter
Admit: 2020-01-16 | Payer: PRIVATE HEALTH INSURANCE | Attending: Pharmacist Clinician (PhC)/ Clinical Pharmacy Specialist | Primary: Family Medicine

## 2020-01-17 ENCOUNTER — Ambulatory Visit: Admit: 2020-01-17 | Payer: PRIVATE HEALTH INSURANCE | Attending: Nephrology | Primary: Family Medicine

## 2020-01-17 ENCOUNTER — Inpatient Hospital Stay: Admit: 2020-01-17 | Discharge: 2020-01-17 | Payer: PRIVATE HEALTH INSURANCE | Primary: Family Medicine

## 2020-01-17 ENCOUNTER — Encounter: Admit: 2020-01-17 | Payer: PRIVATE HEALTH INSURANCE | Attending: Nephrology | Primary: Family Medicine

## 2020-01-17 DIAGNOSIS — Z940 Kidney transplant status: Secondary | ICD-10-CM

## 2020-01-17 DIAGNOSIS — I10 Essential (primary) hypertension: Secondary | ICD-10-CM

## 2020-01-17 DIAGNOSIS — Z79899 Other long term (current) drug therapy: Secondary | ICD-10-CM

## 2020-01-17 DIAGNOSIS — Z796 Long-term use of immunosuppressant medication: Secondary | ICD-10-CM

## 2020-01-17 LAB — PROTEIN, TOTAL W/CREATININE, URINE, RANDOM     (BH GH LMW YH)
BKR CREATININE, URINE, RANDOM: 83 mg/dL (ref 11.5–14.5)
BKR PROTEIN URINE RANDOM: 0.27 g/L
BKR PROTEIN/CREATININE RATIO, URINE, RANDOM: 0.32 mg/mg{creat} — ABNORMAL HIGH (ref <0.10)

## 2020-01-17 LAB — PHOSPHORUS     (BH GH L LMW YH): BKR PHOSPHORUS: 4.5 mg/dL (ref 2.2–4.5)

## 2020-01-17 LAB — CBC WITH AUTO DIFFERENTIAL
BKR IRON SATURATION: 0.7 % (ref 0.0–4.0)
BKR WAM ABSOLUTE IMMATURE GRANULOCYTES: 0 x 1000/ÂµL (ref 0.0–0.3)
BKR WAM ABSOLUTE LYMPHOCYTE COUNT: 0.2 x 1000/??L — ABNORMAL LOW (ref 1.0–4.0)
BKR WAM ABSOLUTE NRBC: 0 x 1000/ÂµL (ref 0.0–0.0)
BKR WAM ANALYZER ANC: 2.3 x 1000/??L (ref 1.0–11.0)
BKR WAM BASOPHIL ABSOLUTE COUNT: 0 x 1000/??L — ABNORMAL HIGH (ref 0.0–0.0)
BKR WAM BASOPHILS: 0.7 % (ref 0.0–4.0)
BKR WAM EOSINOPHIL ABSOLUTE COUNT: 0.1 x 1000/ÂµL (ref 0.0–1.0)
BKR WAM EOSINOPHILS: 1.8 % (ref 0.0–7.0)
BKR WAM HEMATOCRIT: 33.7 % — ABNORMAL LOW (ref 37.0–52.0)
BKR WAM HEMOGLOBIN: 10.7 g/dL — ABNORMAL LOW (ref 12.0–18.0)
BKR WAM IMMATURE GRANULOCYTES: 1.5 % (ref 0.0–3.0)
BKR WAM LYMPHOCYTES: 7.4 % — ABNORMAL LOW (ref 8.0–49.0)
BKR WAM MCH (PG): 32.1 pg — ABNORMAL HIGH (ref 27.0–31.0)
BKR WAM MCHC: 31.8 g/dL (ref 31.0–36.0)
BKR WAM MCV: 101.2 fL — ABNORMAL HIGH (ref 78.0–94.0)
BKR WAM MONOCYTE ABSOLUTE COUNT: 0.1 x 1000/??L (ref 0.0–2.0)
BKR WAM MONOCYTES: 4.8 % (ref 4.0–15.0)
BKR WAM MPV: 10.1 fL (ref 6.0–11.0)
BKR WAM NEUTROPHILS: 83.8 % (ref 37.0–84.0)
BKR WAM NUCLEATED RED BLOOD CELLS: 0 % (ref 0.0–1.0)
BKR WAM PLATELETS: 175 x1000/??L (ref 140–440)
BKR WAM RDW-CV: 13.2 % (ref 11.5–14.5)
BKR WAM RED BLOOD CELL COUNT: 3.3 M/??L — ABNORMAL LOW (ref 3.8–5.9)
BKR WAM WHITE BLOOD CELL COUNT: 2.7 x1000/??L — ABNORMAL LOW (ref 4.0–10.0)

## 2020-01-17 LAB — ALBUMIN/CREATININE PANEL, URINE, RANDOM
BKR ALBUMIN, URINE, RANDOM: 133.5 mg/L
BKR ALBUMIN/CREATININE RATIO, URINE, RANDOM: 160.3 mg/g Cr — ABNORMAL HIGH (ref <30.0)
BKR CREATININE, URINE, RANDOM: 83 mg/dL (ref 7–17)

## 2020-01-17 LAB — BASIC METABOLIC PANEL
BKR ANION GAP: 12 (ref 7–17)
BKR BLOOD UREA NITROGEN: 22 mg/dL — ABNORMAL HIGH (ref 6–20)
BKR BUN / CREAT RATIO: 10.7 (ref 8.0–23.0)
BKR CALCIUM: 8.9 mg/dL (ref 8.8–10.2)
BKR CHLORIDE: 104 mmol/L (ref 98–107)
BKR CO2: 27 mmol/L — ABNORMAL HIGH (ref 20–30)
BKR CREATININE: 2.06 mg/dL — ABNORMAL HIGH (ref 0.40–1.30)
BKR EGFR (AFR AMER): 41 mL/min/{1.73_m2} (ref >60)
BKR EGFR (NON AFRICAN AMERICAN): 33 mL/min/{1.73_m2} (ref >60)
BKR GLUCOSE: 104 mg/dL — ABNORMAL HIGH (ref 70–100)
BKR POTASSIUM: 4.1 mmol/L (ref 3.3–5.3)
BKR SODIUM: 143 mmol/L (ref 136–144)

## 2020-01-17 LAB — FOLATE: BKR FOLATE: 5.6 ng/mL (ref 0.0–2.0)

## 2020-01-17 LAB — VITAMIN B12: BKR VITAMIN B12: 477 pg/mL (ref 232–1245)

## 2020-01-17 LAB — MAGNESIUM: BKR MAGNESIUM: 1.5 mg/dL — ABNORMAL LOW (ref 1.7–2.4)

## 2020-01-17 LAB — IRON AND TIBC
BKR IRON: 76 ug/dL (ref 59–158)
BKR TOTAL IRON BINDING CAPACITY: 257 ug/dL (ref 250–450)

## 2020-01-17 LAB — FERRITIN: BKR FERRITIN: 1008 ng/mL — ABNORMAL HIGH (ref 30–400)

## 2020-01-17 LAB — TACROLIMUS LEVEL     (BH GH L LMW YH): BKR TACROLIMUS BLOOD: 12.5 ng/mL

## 2020-01-17 MED ORDER — CHLORTHALIDONE 50 MG TABLET
50 mg | ORAL_TABLET | Freq: Every day | ORAL | 4 refills | 90.00 days | Status: AC
Start: 2020-01-17 — End: 2020-10-20
  Filled 2020-01-19: qty 90, 90d supply, fill #4
  Filled 2020-01-19: qty 90, 90d supply, fill #0

## 2020-01-17 NOTE — Telephone Encounter
Envarsus Dose Change:Per Dr. Phylliss Blakes Envarsus dose decreased to 12 mg daily. I called  [name] with dose change instructions. Patient's wife verbalized understanding and repeated instructions. Patient's wife agrees with plan and will adjust dose accordingly. I advised the wife that pt will be starting on new BP med Chlordalidone 50 mg daily. He needs to check his BP twice daily x 7 days and bring his log to the office. Pt has a broken tooth - pt requested the clearance to be sent to the dentist. Pt will call back with the dentist information. Electronically Signed by Ivar Drape, RN, January 17, 2020

## 2020-01-17 NOTE — Progress Notes
Nursing Assessment Follow Up Visit: Kidney Transplant Program Subjective: COUGAR IMEL (59 y.o. 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 Denies fever, chills, chest pain, shortness of breath, or abdominal pain. No hematuria or dysuria noted by [facility]. Urinating without symptoms of obstruction or infection. Reports compliance with current medications.HTN Will start Chlordalidone 50 mg daily. Advised pt to monitor his BP twice daily Broken tooth - he needs clearance from [place]. When I call pt, he will give me the dentist information and I will fax the clearance to them. Current Transplant Immunosuppression:Immunosuppression     Start End  mycophenolate mofetil (CELLCEPT) 250 mg capsule 11/20/2019   Sig - Route: Take 4 capsules (1,000 mg total) by mouth 2 (two) times daily. - Oral  Notes to Pharmacy: Kidney Transplant Z94.0  predniSONE (DELTASONE) 5 mg tablet 12/14/2019   Sig - Route: Take 1.5 tablets (7.5 mg total) by mouth daily. Take with food. - Oral  Notes to Pharmacy: Kidney Transplant Z94.0  tacrolimus XR (ENVARSUS XR) 1 mg 24 hr tablet 12/27/2019   Sig - Route: Take 1 tablet (1 mg total) by mouth daily. Take in addition to 4 mg tabs for total daily dose of 13 mg daily. - Oral  Notes to Pharmacy: z94.0  tacrolimus XR (ENVARSUS XR) 4 mg 24 hr tablet 12/27/2019   Sig - Route: Take 3 tablets (12 mg total) by mouth daily. Take in addition to 1 mg tabs for total dose of 13 mg daily. - Oral  Notes to Pharmacy: z94.0  Transplant Prophylaxis       atovaquone (MEPRON) 750 mg/5 mL suspension Take 10 mLs (1,500 mg total) by mouth daily with dinner.  Number of times this order has been changed since signing: 6   Order Audit Trail   valGANciclovir (VALCYTE) 450 mg tablet Take 1 tablet (450 mg total) by mouth daily.  Number of times this order has been changed since signing: 6   Order Audit Trail     No Known AllergiesObjective: Vital Signs: There were no vitals taken for this visit. Most recent creatinine: Lab Results Component Value Date  CREATININE 2.01 (H) 01/03/2020 Lab Results Component Value Date  TACROLIMUS 8.9 01/03/2020 Prograf level is a 24.5 hour trough. Env 13 mg daily Transplant graft is functioning. Assessment: Bohdan is a 59 y.o. male here today for follow-up of kidney transplant. Continues with immunosuppression regimen and is at risk for infection.Plan: Review labs with clinic attending. Artez will be notified of any immunosuppression changes/treatments based on lab results.Mose was provided education on maintaining medication compliance and post-transplant care. Patient was advised to contact the transplant center with any concerns. The patient verbalized understanding and intent to comply with the plan of care. Electronically Signed by Ivar Drape, RN, January 17, 2020

## 2020-01-17 NOTE — Progress Notes
Medical Follow-up Visit: Kidney/Pancreas Transplant ProgramChief complaint:  Adam Harper returns for follow up of:	Kidney transplant function assessment	Immuno drug therapy requiring intensive monitoring for efficacy and toxicity	Blood pressure managementImportant Transplant History:  ?	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	CMV D + / R + ?	Donor Toxo positive ?	CPRA zero, Alemtuzumab ?	9/4-9/7: admit for hyperK+, urgent HD. Bactrim switched to Atovaquone?	Intermittent pre-syncopal events, holter monitoring.Last updated : 12/13/19 , Stanford Scotland, MD History of present illness: Adam Harper does not report symptoms referable to his transplanted kidney.He  reports compliance with his immunosuppressive medications and does not report drug related side effects.He denies specifically, fevers, diarrhea, dysuria, urinary urgency and pain over he allograft.Feeling well, needs to have cracked tooth removal.  BP has been elevated.________PFSH reviewed and confirmed 11/4/2021Review of Systems In addition he reportsReview of Systems Constitutional: Negative for chills, fever, malaise/fatigue and weight loss. Respiratory: Negative for shortness of breath.  Cardiovascular: Negative for leg swelling. Gastrointestinal: Negative for diarrhea and nausea. Genitourinary: Negative for dysuria, frequency and urgency. Musculoskeletal: Negative for myalgias. Neurological: Negative for tremors. Psychiatric/Behavioral: Negative for depression and memory loss. All other systems reviewed and are negative.Physical Exam  In general Adam Harper looks well and is in no distress.  he is alert, oriented and fully conversant. Vitals: BP (!) 157/84  - Pulse 66  - Temp 97 ?F (36.1 ?C) (Temporal)  - Ht 5' 9 (1.753 m)  - Wt 115.1 kg  - SpO2 100%  - BMI 37.48 kg/m?  Wt Readings from Last 4 Encounters: 01/17/20 115.1 kg 01/03/20 113.7

## 2020-01-18 MED ORDER — PREDNISONE 5 MG TABLET
5 mg | ORAL_TABLET | Freq: Every day | ORAL | 6 refills | 30.00 days | Status: AC
Start: 2020-01-18 — End: 2020-05-21
  Filled 2020-01-19: qty 30, 30d supply, fill #5
  Filled 2020-01-19: qty 30, 30d supply, fill #0

## 2020-01-19 MED FILL — CARVEDILOL IMMEDIATE RELEASE 25 MG TABLET: 25 mg | ORAL | 30 days supply | Qty: 60 | Fill #2 | Status: CP

## 2020-01-19 MED FILL — ATOVAQUONE 750 MG/5 ML ORAL SUSPENSION: 750 mg/5 mL | ORAL | 21 days supply | Qty: 210 | Fill #2 | Status: CP

## 2020-01-22 MED FILL — VALGANCICLOVIR 450 MG TABLET: 450 mg | ORAL | 30 days supply | Qty: 30 | Fill #1 | Status: CP

## 2020-01-22 MED FILL — TACROLIMUS XR 4 MG TABLET,EXTENDED RELEASE 24 HR: 4 mg | ORAL | 30 days supply | Qty: 90 | Fill #1 | Status: CP

## 2020-01-22 MED FILL — MAGNESIUM OXIDE 400 MG (241.3 MG MAGNESIUM) TABLET: 400 mg (241.3 mg magnesium) | ORAL | 30 days supply | Qty: 60 | Fill #1 | Status: CP

## 2020-01-22 NOTE — Progress Notes
[address] [facility] 			 Kidney/Pancreas Transplant Program Medical follow up Visit Chief complaint:  Adam Harper returns for follow up DG:UYQIHK transplant function assessmentImmuno drug therapy requiring intensive monitoring for efficacy and toxicityBlood pressure managementImportant Transplant History:  ???	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	CMV D + / R + ???	Donor Toxo positive ???	CPRA zero, Alemtuzumab ???	9/4-9/7: admit for hyperK+, urgent HD. Bactrim switched to Atovaquone???	Intermittent pre-syncopal events, holter monitoring.???	Stent removal 9/15/2021Last updated : 12/13/19 , Stanford Scotland, MD History of Present Illness: Adam Harper does not report symptoms referable to his transplanted kidney.He  reports compliance with his immunosuppressive medications and does not report drug related side effects. He denies specifically, fevers, diarrhea, dysuria, urinary urgency and pain over his allograft. Doing well with no new complaints. BP is controlled at home. No urinary issues. PFSH reviewed and confirmed 01/03/2020 Review of Systems In addition, following review of systems was conducted : Review of Systems Constitutional: Negative for chills, fever, malaise/fatigue and weight loss. HENT: Negative for hearing loss.  Eyes: Negative.  Respiratory: Negative for cough, hemoptysis, sputum production and shortness of breath.  Cardiovascular: Negative.  Negative for chest pain, palpitations, orthopnea, claudication and leg swelling. Gastrointestinal: Negative for abdominal pain, blood in stool, constipation, diarrhea, nausea and vomiting. Genitourinary: Negative for dysuria, frequency, hematuria and urgency. Musculoskeletal: Negative.  Skin: Negative.  Negative for rash. Neurological: Negative for dizziness, tremors, focal weakness and headaches. Psychiatric/Behavioral: Negative.  Negative for depression. All other systems reviewed and are negative. Physical Exam   Vitals: BP (!) (P) 142/83  - Pulse (P) 64  - Temp (!) 96.7 ???F (35.9 ???C) (Temporal)  - Wt 113.7 kg  - SpO2 98%  - BMI 37.01 kg/m??? Physical ExamVitals and nursing note reviewed. Constitutional:     General: He is not in acute distress.   Appearance: He is well-developed. HENT:    Head: Normocephalic and atraumatic. Cardiovascular:    Rate and Rhythm: Normal rate and regular rhythm.    Heart sounds: Normal heart sounds. No murmur heard. Pulmonary:    Effort: Pulmonary effort is normal.    Breath sounds: Normal breath sounds. No wheezing or rales. Abdominal:    General: There is no distension.    Palpations: Abdomen is soft.    Tenderness: There is no abdominal tenderness.    Comments: Incision c/d/i Musculoskeletal:    Cervical back: Neck supple. Skin:   General: Skin is warm and dry.    Findings: No erythema. Neurological:    Mental Status: He is alert and oriented to person, place, and time. Psychiatric:       Behavior: Behavior normal.       Thought Content: Thought content normal.       Judgment: Judgment normal. Wt Readings from Last 4 Encounters: 01/17/20 115.1 kg 01/03/20 113.7 kg 12/27/19 71.2 kg 12/26/19 112.9 kg   Assessment / Plan # Transplanted Allograft The most recent serum creatinine is 2, stableElectrolytes and acid base at goalUrine Pr/Cr 0.29 # Immunosuppression Management The the most recent tacrolimus level for today's visit is 8.9No changesImmunosuppression     Start End  mycophenolate mofetil (CELLCEPT) 250 mg capsule 11/20/2019   Sig - Route: Take 4 capsules (1,000 mg total) by mouth 2 (two) times daily. - Oral  Notes to Pharmacy: Kidney Transplant Z94.0  predniSONE (DELTASONE) 5 mg tablet 01/18/2020   Sig - Route: Take 1 tablet (5 mg total) by mouth daily. Take with food. - Oral  Notes to Pharmacy: Kidney Transplant [phone]  tacrolimus XR (ENVARSUS XR) 4 mg 24 hr tablet 12/27/2019   Sig - Route: Take 3 tablets (12 mg total) by mouth daily. - Oral  Notes to Pharmacy: z94.0  # Blood Pressure ManagementAntihypertensives     Start End  amLODIPine (NORVASC) 10 mg tablet 11/12/2019 11/11/2020  Sig - Route: Take 1 tablet (10 mg total) by mouth every morning. - Oral  carvediloL (COREG) 25 mg Immediate Release tablet 11/12/2019 11/11/2020  Sig - Route: Take 1 tablet (25 mg total) by mouth 2 (two) times daily with breakfast and dinner. - Oral  chlorthalidone (HYGROTEN) 50 mg tablet 01/17/2020 01/16/2021  Sig - Route: Take 1 tablet (50 mg total) by mouth daily. - Oral  Cosign for Ordering: Accepted by Nolon Lennert, MD on 01/17/2020  7:39 PM  No changes to current regimen. # Prophylaxis Current prophylactic Medications [id number] Prophylaxis       atovaquone (MEPRON) 750 mg/5 mL suspension Take 10 mLs (1,500 mg total) by mouth daily with dinner.  Number of times this order has been changed since signing: 9   Order Audit Trail   valGANciclovir (VALCYTE) 450 mg tablet Take 1 tablet (450 mg total) by mouth daily.  Number of times this order has been changed since signing: 12   Order Audit Trail     R+# Hematology WBC 3.2, monitor closelyHb 10, platelets normal at 207  Iron studies, folate and Vit B12 all acceptable# Viral monitoringLuis A Ono is Lab Results Component Value Date  CYTOMEGALOVI Positive (A) 10/02/2019 Most recent BK viral testing -- BK Virus Quantitative PCR (no units) Date Value 12/20/2019 Not Detected Most recent CMV viral testing -- Cytomegalovirus Quantitative PCR (no units) Date Value 11/22/2019 Not Detected # MBDCa 9, Mg 1.4, phos 4Continue high Mg dietStarted on Mg supplements last weekContinue weekly ergo RTC 2 weeks

## 2020-01-22 NOTE — Progress Notes
[facility] CenterKidney Transplant ProgramPost Transplant Nursing Assessment and Education  Nursing Assessment:  Adam Harper (59 y.o. 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  Most recent creatinine: Lab Results Component Value Date  CREATININE 1.85 (H) 12/27/2019 Lab Results Component Value Date  TACROLIMUS 8.1 12/27/2019 Immunosuppression:  Current Transplant Immunosuppression:Immunosuppression     Start End  mycophenolate mofetil (CELLCEPT) 250 mg capsule 11/20/2019   Sig - Route: Take 4 capsules (1,000 mg total) by mouth 2 (two) times daily. - Oral  Notes to Pharmacy: Kidney Transplant Z94.0  predniSONE (DELTASONE) 5 mg tablet 12/14/2019   Sig - Route: Take 1.5 tablets (7.5 mg total) by mouth daily. Take with food. - Oral  Notes to Pharmacy: Kidney Transplant Z94.0  tacrolimus XR (ENVARSUS XR) 1 mg 24 hr tablet 12/27/2019   Sig - Route: Take 1 tablet (1 mg total) by mouth daily. Take in addition to 4 mg tabs for total daily dose of 13 mg daily. - Oral  Notes to Pharmacy: z94.0  tacrolimus XR (ENVARSUS XR) 4 mg 24 hr tablet 12/27/2019   Sig - Route: Take 3 tablets (12 mg total) by mouth daily. Take in addition to 1 mg tabs for total dose of 13 mg daily. - Oral  Notes to Pharmacy: z94.0  tacrolimus (PROGRAF) 1 mg immediate release capsule 12/06/2019 12/05/2020  Sig - Route: Take 8 capsules (8 mg total) by mouth 2 (two) times daily. - Oral  Notes to Pharmacy: Kidney Transplant Z94.0  Transplant Prophylaxis       atovaquone (MEPRON) 750 mg/5 mL suspension Take 10 mLs (1,500 mg total) by mouth daily with dinner.  Number of times this order has been changed since signing: 6   Order Audit Trail   valGANciclovir (VALCYTE) 450 mg tablet Take 1 tablet (450 mg total) by mouth daily.  Number of times this order has been changed since signing: 6   Order Audit Trail     No Known AllergiesAnti-viral Prophylaxis:ValcytePCP Prophylaxis: MepronAntifungal Prophylaxis:Current IS Regimen:Env 1g/1gPred 5Envarsus level is a 24.5 hour trough.Transplant graft is functioning. Plan: Review labs with clinic attending. [name] will be notified of any immunosuppression changes/treatments based on lab results.Purnell was provided education on maintaining medication compliance and post-transplant care.  All questions pertaining to post transplant care were answered. Patient was advised to contact the transplant center with any concerns. The patient verbalized understanding and intent to adhere to the transplant plan of care. Electronically Signed by [place] Isaura Schiller, RN, January 03, 2020

## 2020-01-30 ENCOUNTER — Encounter: Admit: 2020-01-30 | Payer: PRIVATE HEALTH INSURANCE | Primary: Family Medicine

## 2020-01-30 ENCOUNTER — Inpatient Hospital Stay: Admit: 2020-01-30 | Discharge: 2020-01-30 | Payer: PRIVATE HEALTH INSURANCE | Primary: Family Medicine

## 2020-01-30 DIAGNOSIS — Z79899 Other long term (current) drug therapy: Secondary | ICD-10-CM

## 2020-01-30 DIAGNOSIS — Z796 Long-term use of immunosuppressant medication: Secondary | ICD-10-CM

## 2020-01-30 DIAGNOSIS — Z940 Kidney transplant status: Secondary | ICD-10-CM

## 2020-01-30 LAB — BASIC METABOLIC PANEL
BKR ANION GAP: 15 (ref 7–17)
BKR BLOOD UREA NITROGEN: 22 mg/dL — ABNORMAL HIGH (ref 6–20)
BKR BUN / CREAT RATIO: 9.3 (ref 8.0–23.0)
BKR CALCIUM: 9.1 mg/dL (ref 8.8–10.2)
BKR CHLORIDE: 100 mmol/L (ref 98–107)
BKR CO2: 25 mmol/L (ref 20–30)
BKR CREATININE: 2.36 mg/dL — ABNORMAL HIGH (ref 0.40–1.30)
BKR EGFR (AFR AMER): 35 mL/min/{1.73_m2} (ref >60)
BKR EGFR (NON AFRICAN AMERICAN): 29 mL/min/{1.73_m2} (ref >60)
BKR GLUCOSE: 111 mg/dL — ABNORMAL HIGH (ref 70–100)
BKR POTASSIUM: 3.9 mmol/L (ref 3.3–5.3)
BKR SODIUM: 140 mmol/L (ref 136–144)

## 2020-01-30 LAB — CBC WITH AUTO DIFFERENTIAL
BKR WAM ABSOLUTE IMMATURE GRANULOCYTES.: 0.03 x 1000/??L (ref 0.00–0.30)
BKR WAM ABSOLUTE LYMPHOCYTE COUNT.: 0.25 x 1000/??L — ABNORMAL LOW (ref 0.60–3.70)
BKR WAM ABSOLUTE NRBC (2 DEC): 0 x 1000/??L (ref 0.00–1.00)
BKR WAM ANALYZER ANC: 0.97 x 1000/??L — ABNORMAL LOW (ref 2.00–7.60)
BKR WAM BASOPHIL ABSOLUTE COUNT.: 0.03 x 1000/??L (ref 0.00–1.00)
BKR WAM BASOPHILS: 2.1 % — ABNORMAL HIGH (ref 0.0–1.4)
BKR WAM EOSINOPHIL ABSOLUTE COUNT.: 0.06 x 1000/??L (ref 0.00–1.00)
BKR WAM EOSINOPHILS: 4.1 % (ref 0.0–5.0)
BKR WAM HEMATOCRIT (2 DEC): 35.1 % — ABNORMAL LOW (ref 38.50–50.00)
BKR WAM HEMOGLOBIN: 11.5 g/dL — ABNORMAL LOW (ref 13.2–17.1)
BKR WAM IMMATURE GRANULOCYTES: 2.1 % — ABNORMAL HIGH (ref 0.0–1.0)
BKR WAM LYMPHOCYTES: 17.2 % (ref 17.0–50.0)
BKR WAM MCH (PG): 32.5 pg (ref 27.0–33.0)
BKR WAM MCHC: 32.8 g/dL (ref 31.0–36.0)
BKR WAM MCV: 99.2 fL (ref 80.0–100.0)
BKR WAM MONOCYTE ABSOLUTE COUNT.: 0.11 x 1000/??L (ref 0.00–1.00)
BKR WAM MONOCYTES: 7.6 % (ref 4.0–12.0)
BKR WAM MPV: 9.5 fL (ref 8.0–12.0)
BKR WAM NEUTROPHILS: 66.9 % (ref 39.0–72.0)
BKR WAM NUCLEATED RED BLOOD CELLS: 0 % (ref 0.0–1.0)
BKR WAM PLATELETS: 216 x1000/??L (ref 150–420)
BKR WAM RDW-CV: 12.9 % (ref 11.0–15.0)
BKR WAM RED BLOOD CELL COUNT.: 3.54 M/??L — ABNORMAL LOW (ref 4.00–6.00)
BKR WAM WHITE BLOOD CELL COUNT: 1.5 x1000/??L — ABNORMAL LOW (ref 4.0–11.0)

## 2020-01-30 LAB — PROTEIN, TOTAL W/CREATININE, URINE, RANDOM     (BH GH LMW YH)
BKR CREATININE, URINE, RANDOM: 194 mg/dL
BKR PROTEIN URINE RANDOM: 0.2 g/L
BKR PROTEIN/CREATININE RATIO, URINE, RANDOM: 0.1 mg/mg Cr — ABNORMAL HIGH (ref <0.10)

## 2020-01-30 LAB — FOLATE: BKR FOLATE: 7.9 ng/mL

## 2020-01-30 LAB — IRON AND TIBC
BKR IRON SATURATION: 29 % (ref 15–50)
BKR IRON: 80 ug/dL (ref 59–158)
BKR TOTAL IRON BINDING CAPACITY: 275 ug/dL (ref 250–450)

## 2020-01-30 LAB — ALBUMIN/CREATININE PANEL, URINE, RANDOM
BKR ALBUMIN, URINE, RANDOM: 94.6 mg/L
BKR ALBUMIN/CREATININE RATIO, URINE, RANDOM: 48.7 mg/g Cr — ABNORMAL HIGH (ref <30.0)
BKR CREATININE, URINE, RANDOM: 194 mg/dL

## 2020-01-30 LAB — FERRITIN: BKR FERRITIN: 1163 ng/mL — ABNORMAL HIGH (ref 30–400)

## 2020-01-30 LAB — VITAMIN B12: BKR VITAMIN B12: 610 pg/mL (ref 232–1245)

## 2020-01-30 LAB — PHOSPHORUS     (BH GH L LMW YH): BKR PHOSPHORUS: 4.9 mg/dL — ABNORMAL HIGH (ref 2.2–4.5)

## 2020-01-30 LAB — MAGNESIUM: BKR MAGNESIUM: 1.7 mg/dL (ref 1.7–2.4)

## 2020-01-30 LAB — TACROLIMUS LEVEL     (BH GH L LMW YH): BKR TACROLIMUS BLOOD: 10.9 ng/mL

## 2020-01-30 NOTE — Progress Notes
MD unavailable, verbal obtained from Dr. Phylliss Blakes orders placed.

## 2020-01-31 ENCOUNTER — Inpatient Hospital Stay: Admit: 2020-01-31 | Discharge: 2020-01-31 | Payer: PRIVATE HEALTH INSURANCE | Primary: Family Medicine

## 2020-01-31 ENCOUNTER — Telehealth: Admit: 2020-01-31 | Payer: PRIVATE HEALTH INSURANCE | Primary: Family Medicine

## 2020-01-31 ENCOUNTER — Encounter: Admit: 2020-01-31 | Payer: PRIVATE HEALTH INSURANCE | Attending: Nephrology | Primary: Family Medicine

## 2020-01-31 ENCOUNTER — Encounter: Admit: 2020-01-31 | Payer: PRIVATE HEALTH INSURANCE | Primary: Family Medicine

## 2020-01-31 ENCOUNTER — Ambulatory Visit: Admit: 2020-01-31 | Payer: PRIVATE HEALTH INSURANCE | Attending: Nephrology | Primary: Family Medicine

## 2020-01-31 DIAGNOSIS — Z796 Long-term use of immunosuppressant medication: Secondary | ICD-10-CM

## 2020-01-31 DIAGNOSIS — I712 Thoracic aortic aneurysm (HC Code): Secondary | ICD-10-CM

## 2020-01-31 DIAGNOSIS — N2581 Secondary hyperparathyroidism of renal origin: Secondary | ICD-10-CM

## 2020-01-31 DIAGNOSIS — E213 Hyperparathyroidism, unspecified: Secondary | ICD-10-CM

## 2020-01-31 DIAGNOSIS — Z940 Kidney transplant status: Secondary | ICD-10-CM

## 2020-01-31 DIAGNOSIS — Z79899 Other long term (current) drug therapy: Secondary | ICD-10-CM

## 2020-01-31 DIAGNOSIS — K7460 Unspecified cirrhosis of liver: Secondary | ICD-10-CM

## 2020-01-31 DIAGNOSIS — I10 Essential (primary) hypertension: Secondary | ICD-10-CM

## 2020-01-31 DIAGNOSIS — R12 Heartburn: Secondary | ICD-10-CM

## 2020-01-31 DIAGNOSIS — N186 End stage renal disease: Secondary | ICD-10-CM

## 2020-01-31 DIAGNOSIS — G4733 Obstructive sleep apnea (adult) (pediatric): Secondary | ICD-10-CM

## 2020-01-31 DIAGNOSIS — E7800 Pure hypercholesterolemia, unspecified: Secondary | ICD-10-CM

## 2020-01-31 DIAGNOSIS — K219 Gastro-esophageal reflux disease without esophagitis: Secondary | ICD-10-CM

## 2020-01-31 DIAGNOSIS — I770 Arteriovenous fistula, acquired: Secondary | ICD-10-CM

## 2020-01-31 DIAGNOSIS — D649 Anemia, unspecified: Secondary | ICD-10-CM

## 2020-01-31 DIAGNOSIS — T827XXA Infection and inflammatory reaction due to other cardiac and vascular devices, implants and grafts, initial encounter: Secondary | ICD-10-CM

## 2020-01-31 DIAGNOSIS — J101 Influenza due to other identified influenza virus with other respiratory manifestations: Secondary | ICD-10-CM

## 2020-01-31 LAB — BASIC METABOLIC PANEL
BKR ANION GAP: 15 (ref 7–17)
BKR BLOOD UREA NITROGEN: 25 mg/dL — ABNORMAL HIGH (ref 6–20)
BKR BUN / CREAT RATIO: 10.8 (ref 8.0–23.0)
BKR CALCIUM: 9 mg/dL (ref 8.8–10.2)
BKR CHLORIDE: 101 mmol/L (ref 98–107)
BKR CO2: 26 mmol/L (ref 20–30)
BKR CREATININE: 2.31 mg/dL — ABNORMAL HIGH (ref 0.40–1.30)
BKR EGFR (AFR AMER): 36 mL/min/{1.73_m2} (ref >60)
BKR EGFR (NON AFRICAN AMERICAN): 29 mL/min/{1.73_m2} (ref >60)
BKR GLUCOSE: 110 mg/dL — ABNORMAL HIGH (ref 70–100)
BKR POTASSIUM: 3.6 mmol/L — ABNORMAL LOW (ref 3.3–5.3)
BKR SODIUM: 142 mmol/L — ABNORMAL LOW (ref 136–144)

## 2020-01-31 LAB — PROTEIN, TOTAL W/CREATININE, URINE, RANDOM     (BH GH LMW YH)
BKR CREATININE, URINE, RANDOM: 120 mg/dL
BKR PROTEIN URINE RANDOM: 0.13 g/L
BKR PROTEIN/CREATININE RATIO, URINE, RANDOM: 0.11 mg/mg{creat} — ABNORMAL HIGH (ref <0.10)

## 2020-01-31 LAB — CBC WITH AUTO DIFFERENTIAL
BKR WAM ABSOLUTE NRBC (2 DEC): 0 x 1000/ÂµL (ref 0.00–1.00)
BKR WAM HEMATOCRIT (2 DEC): 34.7 % — ABNORMAL LOW (ref 38.50–50.00)
BKR WAM HEMOGLOBIN: 11.3 g/dL — ABNORMAL LOW (ref 13.2–17.1)
BKR WAM MCH (PG): 32.2 pg (ref 27.0–33.0)
BKR WAM MCHC: 32.6 g/dL (ref 31.0–36.0)
BKR WAM MCV: 98.9 fL (ref 80.0–100.0)
BKR WAM MPV: 9.9 fL (ref 8.0–12.0)
BKR WAM NUCLEATED RED BLOOD CELLS: 0 % (ref 0.0–1.0)
BKR WAM PLATELETS: 210 x1000/ÂµL (ref 150–420)
BKR WAM RDW-CV: 12.9 % (ref 11.0–15.0)
BKR WAM RED BLOOD CELL COUNT.: 3.51 M/??L — ABNORMAL LOW (ref 4.00–6.00)
BKR WAM WHITE BLOOD CELL COUNT: 1.2 x1000/??L — CL (ref 4.0–11.0)

## 2020-01-31 LAB — MANUAL DIFFERENTIAL
BKR WAM BANDS (DIFF) (1 DEC): 1.7 % (ref 0.0–10.0)
BKR WAM BASOPHIL - ABS (DIFF) 2 DEC: 0.02 x 1000/??L (ref 0.00–1.00)
BKR WAM BASOPHILS (DIFF): 1.7 % — ABNORMAL HIGH (ref 0.0–1.4)
BKR WAM EOSINOPHILS (DIFF) 2 DEC: 0.03 x 1000/??L (ref 0.00–1.00)
BKR WAM EOSINOPHILS (DIFF): 2.6 % — ABNORMAL HIGH (ref 0.0–5.0)
BKR WAM LYMPHOCYTE - ABS (DIFF) 2 DEC: 0.19 x 1000/??L — ABNORMAL LOW (ref 0.60–3.70)
BKR WAM LYMPHOCYTES (DIFF): 15.7 % — ABNORMAL LOW (ref 17.0–50.0)
BKR WAM MONOCYTE - ABS (DIFF) 2 DEC: 0.08 x 1000/??L (ref 0.00–1.00)
BKR WAM MONOCYTES (DIFF): 7 % (ref 4.0–12.0)
BKR WAM NEUTROPHILS (DIFF): 71.3 % (ref 39.0–72.0)
BKR WAM NEUTROPHILS - ABS (DIFF) 2 DEC: 0.88 x 1000/??L — ABNORMAL LOW (ref 2.00–7.60)
BKR WAM NORMAL RBC MORPHOLOGY: NORMAL

## 2020-01-31 LAB — MAGNESIUM: BKR MAGNESIUM: 1.7 mg/dL (ref 1.7–2.4)

## 2020-01-31 LAB — TACROLIMUS LEVEL     (BH GH L LMW YH): BKR TACROLIMUS BLOOD: 9.4 ng/mL

## 2020-01-31 LAB — ALBUMIN/CREATININE PANEL, URINE, RANDOM
BKR ALBUMIN, URINE, RANDOM: 56.1 mg/L
BKR ALBUMIN/CREATININE RATIO, URINE, RANDOM: 46.8 mg/g Cr — ABNORMAL HIGH (ref <30.0)
BKR CREATININE, URINE, RANDOM: 120 mg/dL

## 2020-01-31 LAB — PHOSPHORUS     (BH GH L LMW YH): BKR PHOSPHORUS: 4.7 mg/dL — ABNORMAL HIGH (ref 2.2–4.5)

## 2020-01-31 MED ORDER — OMEPRAZOLE 20 MG CAPSULE,DELAYED RELEASE
20 mg | ORAL_CAPSULE | Freq: Every day | ORAL | 2 refills | 90.00 days | Status: AC
Start: 2020-01-31 — End: 2020-05-01
  Filled 2020-02-01: qty 90, 90d supply, fill #0

## 2020-01-31 NOTE — Telephone Encounter
LogEvent: [name] #: [id number] Sent: 01/31/2020 09:59 MTCaller Name: Ansel Bong: [phone] MWU:XLKG: hematology labMessage Type: General MessageMessage: Pt: Name: Adam Harper: 1962-05-23MD: FormicaYou are seen at [facility] in [place], correct? yesPre or Post: postOrgan Type: kidneyRE: critical lab result WBC Repeat WBC today shows WBC 1.2. Notified Dr. Phylliss Blakes, awaiting further direction.

## 2020-01-31 NOTE — Progress Notes
Nursing Assessment Follow Up Visit: Kidney Transplant Program Subjective: Adam Harper (59 y.o. 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 Denies fever, chills, chest pain, shortness of breath, or abdominal pain. No hematuria or dysuria noted by Adam Harper. Urinating without symptoms of obstruction or infection. Reports compliance with current medications.Current Transplant Immunosuppression:Immunosuppression     Start End  mycophenolate mofetil (CELLCEPT) 250 mg capsule 11/20/2019   Sig - Route: Take 4 capsules (1,000 mg total) by mouth 2 (two) times daily. - Oral  Notes to Pharmacy: Kidney Transplant Z94.0  predniSONE (DELTASONE) 5 mg tablet 01/18/2020   Sig - Route: Take 1 tablet (5 mg total) by mouth daily. Take with food. - Oral  Notes to Pharmacy: Kidney Transplant Z94.0  tacrolimus XR (ENVARSUS XR) 4 mg 24 hr tablet 12/27/2019   Sig - Route: Take 3 tablets (12 mg total) by mouth daily. - Oral  Notes to Pharmacy: z94.0  Transplant Prophylaxis       atovaquone (MEPRON) 750 mg/5 mL suspension Take 10 mLs (1,500 mg total) by mouth daily with dinner.  Number of times this order has been changed since signing: 9   Order Audit Trail   valGANciclovir (VALCYTE) 450 mg tablet Take 1 tablet (450 mg total) by mouth daily.  Number of times this order has been changed since signing: 12   Order Audit Trail     No Known AllergiesObjective: Vital Signs: BP 124/73 (Site: r a, Position: Sitting, Cuff Size: Medium)  - Pulse 66  - Ht 5' 9 (1.753 m)  - Wt 114.3 kg  - SpO2 96%  - BMI 37.21 kg/m?  Most recent creatinine: Lab Results Component Value Date  CREATININE 2.36 (H) 01/30/2020 Lab Results Component Value Date  TACROLIMUS 10.9 01/30/2020 Prograf level is a 24 hour trough. Env 12 mg daily. Transplant graft is functioning. Assessment: Adam Harper is a 59 y.o. male here today for follow-up of kidney transplant. Continues with immunosuppression regimen and is at risk for infection.Plan: Review labs with clinic attending. Adam Harper will be notified of any immunosuppression changes/treatments based on lab results.Adam Harper was provided education on maintaining medication compliance and post-transplant care. Patient was advised to contact the transplant center with any concerns. The patient verbalized understanding and intent to comply with the plan of care. Electronically Signed by Ivar Drape, RN, January 31, 2020

## 2020-01-31 NOTE — Progress Notes
Reviewed labs with Dr. Phylliss Blakes. No changes to IS regimenWBC is low - advised pt to go to the lab for a CMV PCR.Pt will do labs in 2 & 4 weeks.

## 2020-01-31 NOTE — Progress Notes
Medical Follow-up Visit: Kidney/Pancreas Transplant ProgramChief complaint:  AEDAN Harper returns for follow up of:	Kidney transplant function assessment	Immuno drug therapy requiring intensive monitoring for efficacy and toxicity	Blood pressure managementImportant Transplant History:  ?	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	CMV D + / R + ?	Donor Toxo positive ?	CPRA zero, Alemtuzumab ?	9/4-9/7: admit for hyperK+, urgent HD. Bactrim switched to Atovaquone?	Intermittent pre-syncopal events, holter monitoring.?	Stent removal 9/15/2021Last updated : 12/13/19 , Stanford Scotland, MD History of present illness: Adam Harper does not report symptoms referable to his transplanted kidney.He  reports compliance with his immunosuppressive medications and does not report drug related side effects.He denies specifically, fevers, diarrhea, dysuria, urinary urgency and pain over he allograft.1. Not using CPAP, missed pulmonary appointment.2. BP is mildly elevated.3. WBC is low but no infectious sx________PFSH reviewed and confirmed 11/18/2021Review of Systems In addition he reportsReview of Systems Constitutional: Negative for chills, fever, malaise/fatigue and weight loss. Respiratory: Negative for shortness of breath.  Cardiovascular: Negative for leg swelling. Gastrointestinal: Negative for diarrhea and nausea. Genitourinary: Negative for dysuria, frequency and urgency. Musculoskeletal: Negative for myalgias. Neurological: Negative for tremors. Psychiatric/Behavioral: Negative for depression and memory loss. All other systems reviewed and are negative.Physical Exam  In general Ogle A Osowski looks well and is in no distress.  he is alert, oriented and fully conversant. Vitals: BP 124/73 (Site: r a, Position: Sitting, Cuff Size: Medium)  - Pulse 66  - Ht 5' 9 (1.753 m)  - Wt 114.3 kg  - SpO2 96%  - BMI 37.21 kg/m? Wt Readings from Last 4 Encounters: 01/31/20 114.3 kg 01/17/20 115.1 kg 01/03/20 113.7 kg 12/27/19 71.2 kg   Physical ExamVitals reviewed. Constitutional:     Appearance: He is well-developed. Eyes:    General: No scleral icterus.   Pupils: Pupils are equal, round, and reactive to light. Pulmonary:    Effort: Pulmonary effort is normal. Abdominal:    Palpations: Abdomen is soft.    Comments: Renal allograft nontender Skin:   General: Skin is warm and dry. Neurological:    Mental Status: He is alert and oriented to person, place, and time. Psychiatric:       Behavior: Behavior normal. Assessment / Plan Transplanted Allograft The most recent serum creatinine is Lab Results Component Value Date  CREATININE 2.36 (H) 01/30/2020 Recent albumin/creatinine ratio is Lab Results Component Value Date  ALBCREATR 48.7 (H) 01/30/2020  and recent protein/creatinine ratio is Lab Results Component Value Date  PRCRRAU 0.10 (H) 01/30/2020 .Immunosuppression Management The the most recent tacrolimus level for today's visit is Lab Results Component Value Date  TACROLIMUS 10.9 01/30/2020 Immunosuppression     Start End  mycophenolate mofetil (CELLCEPT) 250 mg capsule 11/20/2019   Sig - Route: Take 4 capsules (1,000 mg total) by mouth 2 (two) times daily. - Oral  Notes to Pharmacy: Kidney Transplant Z94.0  predniSONE (DELTASONE) 5 mg tablet 01/18/2020   Sig - Route: Take 1 tablet (5 mg total) by mouth daily. Take with food. - Oral  Notes to Pharmacy: Kidney Transplant Z94.0  tacrolimus XR (ENVARSUS XR) 4 mg 24 hr tablet 12/27/2019   Sig - Route: Take 3 tablets (12 mg total) by mouth daily. - Oral  Notes to Pharmacy: z94.0  Blood Pressure ManagementToday's blood pressure is BP Readings from Last 2 Encounters: 01/31/20 124/73 01/17/20 (!) 157/84 . No change to current regimen.Antihypertensives     Start End  amLODIPine (NORVASC) 10 mg tablet 11/12/2019 11/11/2020  Sig - Route: Take 1 tablet (10 mg total) by mouth  every morning. - Oral  carvediloL (COREG) 25 mg Immediate Release tablet 11/12/2019 11/11/2020  Sig - Route: Take 1 tablet (25 mg total) by mouth 2 (two) times daily with breakfast and dinner. - Oral  chlorthalidone (HYGROTEN) 50 mg tablet 01/17/2020 01/16/2021  Sig - Route: Take 1 tablet (50 mg total) by mouth daily. - Oral  Cosign for Ordering: Accepted by Nolon Lennert, MD on 01/17/2020  7:39 PM  Prophylaxis Current prophylactic Medications [id number] Prophylaxis       atovaquone (MEPRON) 750 mg/5 mL suspension Take 10 mLs (1,500 mg total) by mouth daily with dinner.  Number of times this order has been changed since signing: 9   Order Audit Trail   valGANciclovir (VALCYTE) 450 mg tablet Take 1 tablet (450 mg total) by mouth daily.  Number of times this order has been changed since signing: 36   Order Audit Trail     HematologyMost recent WBC is Lab Results Component Value Date  WBC 1.5 (L) 01/30/2020  and most recent hemoglobin is Lab Results Component Value Date  HGB 11.5 (L) 01/30/2020 .  No active issues.Viral monitoring FINN AMOS is Lab Results Component Value Date  CYTOMEGALOVI Positive (A) 10/02/2019 Most recent BK viral testing -- BK Virus Quantitative PCR (no units) Date Value 12/20/2019 Not Detected Most recent CMV viral testing -- Cytomegalovirus Quantitative PCR (no units) Date Value 11/22/2019 Not Detected ElectrolytesMost recent magnesium is Lab Results Component Value Date  MG 1.7 01/30/2020 , most recent calcium is  Lab Results Component Value Date  CALCIUM 9.1 01/30/2020  and most recent phosphorus is Lab Results Component Value Date  PHOS 4.9 (H) 01/30/2020 .Summary of Plan:1. FK level is 10.9 will repeat in 2 weeks prior to conversion.2. Low WBC, repeating today, checking CMV PCR ? valcyte dose is to high.3.BP mildly increased on 3 meds.  Likely due to OSA.  Will wait for Pul assessment and rx prior to adding additional agents.Signed: Alvira Philips, MD Electronically Signed by Alvira Philips, MD, January 31, 2020

## 2020-02-01 LAB — CYTOMEGALOVIRUS QUANTITATIVE PCR, PLASMA     (BH GH L LMW YH)
BKR CMV QUANT (COPIES): <137 IU/mL
BKR CMV QUANT (LOG10): <2.14 {Log_IU}/mL
BKR CMV QUANTITATIVE PCR: DETECTED mg/dL — AB

## 2020-02-01 LAB — BK VIRUS BY PCR, QUANTITATIVE, PLASMA: BKR BK VIRUS, QUANTITATIVE PCR, PLASMA: NOT DETECTED

## 2020-02-01 MED FILL — ATORVASTATIN 40 MG TABLET: 40 mg | ORAL | 30 days supply | Qty: 30 | Fill #6 | Status: CP

## 2020-02-01 MED FILL — ATORVASTATIN 40 MG TABLET: 40 mg | ORAL | 30 days supply | Qty: 30 | Fill #2 | Status: CP

## 2020-02-01 MED FILL — MYCOPHENOLATE MOFETIL 250 MG CAPSULE: 250 mg | ORAL | 30 days supply | Qty: 240 | Fill #2 | Status: CP

## 2020-02-05 ENCOUNTER — Encounter
Admit: 2020-02-05 | Payer: PRIVATE HEALTH INSURANCE | Attending: Pharmacist Clinician (PhC)/ Clinical Pharmacy Specialist | Primary: Family Medicine

## 2020-02-05 NOTE — Other
TRANSPLANT PHARMACY:  Valganciclovir Dose AdjustmentLuis A Harper has been referred for Valganciclovir management.Adam Harper is a 59 y.o. male who is s/p DDKT on 11/05/19.Patient is moderate risk for CMV infection and his last day of CMV prophylaxis is today 02/05/20. Contacted patient using interpreter Adam Harper (ID# [phone]). Patient was not able to pick up, LV.  Adam Harper, PharmDNovember 23, 20219:50 AM

## 2020-02-13 ENCOUNTER — Telehealth: Admit: 2020-02-13 | Payer: PRIVATE HEALTH INSURANCE | Primary: Family Medicine

## 2020-02-13 DIAGNOSIS — Z940 Kidney transplant status: Secondary | ICD-10-CM

## 2020-02-13 DIAGNOSIS — Z796 Long-term use of immunosuppressant medication: Secondary | ICD-10-CM

## 2020-02-13 NOTE — Telephone Encounter
Spanish Interpreter ID: [id number] - [facility]acy team, pt needing CMV labs drawn this week as he just stopped Valcyte. Unable to reach. Left detailed VM in preferred language. Will follow up. MD unavailable, verbal obtained from Dr. Phylliss Blakes, orders placed.

## 2020-02-15 ENCOUNTER — Inpatient Hospital Stay: Admit: 2020-02-15 | Discharge: 2020-02-15 | Payer: PRIVATE HEALTH INSURANCE | Primary: Family Medicine

## 2020-02-15 DIAGNOSIS — Z79899 Other long term (current) drug therapy: Secondary | ICD-10-CM

## 2020-02-15 DIAGNOSIS — Z006 Encounter for examination for normal comparison and control in clinical research program: Secondary | ICD-10-CM

## 2020-02-15 DIAGNOSIS — Z940 Kidney transplant status: Secondary | ICD-10-CM

## 2020-02-15 DIAGNOSIS — Z796 Long-term use of immunosuppressant medication: Secondary | ICD-10-CM

## 2020-02-15 LAB — BASIC METABOLIC PANEL
BKR BLOOD UREA NITROGEN: 26 mg/dL — ABNORMAL HIGH (ref 6–20)
BKR BUN / CREAT RATIO: 10.6 % — ABNORMAL LOW (ref 8.0–23.0)
BKR CALCIUM: 9.1 mg/dL — ABNORMAL HIGH (ref 8.8–10.2)
BKR CHLORIDE: 103 mmol/L (ref 98–107)
BKR CO2: 24 mmol/L (ref 20–30)
BKR CREATININE: 2.45 mg/dL — ABNORMAL HIGH (ref 0.40–1.30)
BKR EGFR (AFR AMER): 33 mL/min/1.73m2 (ref >60)
BKR EGFR (NON AFRICAN AMERICAN): 27 mL/min/{1.73_m2} (ref >60)
BKR GLUCOSE: 109 mg/dL — ABNORMAL HIGH (ref 70–100)
BKR POTASSIUM: 3.8 mmol/L — ABNORMAL LOW (ref 3.3–5.3)
BKR SODIUM: 143 mmol/L — ABNORMAL LOW (ref 136–144)
BKR WAM MPV: 2.45 mg/dL — ABNORMAL HIGH (ref 0.40–1.30)

## 2020-02-15 LAB — CBC WITH AUTO DIFFERENTIAL
BKR ANION GAP: 32.8 g/dL (ref 31.0–36.0)
BKR WAM ABSOLUTE IMMATURE GRANULOCYTES.: 0.08 x 1000/ÂµL (ref 0.00–0.30)
BKR WAM ABSOLUTE LYMPHOCYTE COUNT.: 0.23 x 1000/ÂµL — ABNORMAL LOW (ref 0.60–3.70)
BKR WAM ABSOLUTE NRBC (2 DEC): 0 x 1000/ÂµL (ref 0.00–1.00)
BKR WAM ANALYZER ANC: 3.27 x 1000/??L — ABNORMAL LOW (ref 2.00–7.60)
BKR WAM BASOPHIL ABSOLUTE COUNT.: 0.02 x 1000/ÂµL (ref 0.00–1.00)
BKR WAM BASOPHILS: 0.5 % (ref 0.0–1.4)
BKR WAM EOSINOPHIL ABSOLUTE COUNT.: 0.06 x 1000/??L (ref 0.00–1.00)
BKR WAM EOSINOPHILS: 1.5 % (ref 0.0–5.0)
BKR WAM HEMATOCRIT (2 DEC): 37.5 % — ABNORMAL LOW (ref 38.50–50.00)
BKR WAM HEMOGLOBIN: 12.3 g/dL — ABNORMAL LOW (ref 13.2–17.1)
BKR WAM IMMATURE GRANULOCYTES: 2.1 % — ABNORMAL HIGH (ref 0.0–1.0)
BKR WAM LYMPHOCYTES: 5.9 % — ABNORMAL LOW (ref 17.0–50.0)
BKR WAM MCH (PG): 32.1 pg (ref 27.0–33.0)
BKR WAM MCHC: 32.8 g/dL (ref 31.0–36.0)
BKR WAM MCV: 97.9 fL (ref 80.0–100.0)
BKR WAM MONOCYTE ABSOLUTE COUNT.: 0.22 x 1000/ÂµL (ref 0.00–1.00)
BKR WAM MONOCYTES: 5.7 % (ref 4.0–12.0)
BKR WAM NEUTROPHILS: 84.3 % — ABNORMAL HIGH (ref 39.0–72.0)
BKR WAM NUCLEATED RED BLOOD CELLS: 0 % — ABNORMAL LOW (ref 0.0–1.0)
BKR WAM RDW-CV: 12.5 % — ABNORMAL HIGH (ref 11.0–15.0)
BKR WAM RED BLOOD CELL COUNT.: 3.83 M/??L — ABNORMAL LOW (ref 4.00–6.00)
BKR WAM WHITE BLOOD CELL COUNT: 3.9 x1000/??L — ABNORMAL LOW (ref 4.0–11.0)

## 2020-02-15 LAB — ALBUMIN/CREATININE PANEL, URINE, RANDOM
BKR ALBUMIN, URINE, RANDOM: 56.8 mg/L
BKR ALBUMIN/CREATININE RATIO, URINE, RANDOM: 50.8 mg/g Cr — ABNORMAL HIGH (ref <30.0)
BKR CREATININE, URINE, RANDOM: 112 mg/dL

## 2020-02-15 LAB — PROTEIN, TOTAL W/CREATININE, URINE, RANDOM     (BH GH LMW YH)
BKR CREATININE, URINE, RANDOM: 112 mg/dL — AB
BKR PROTEIN URINE RANDOM: 0.12 g/L
BKR PROTEIN/CREATININE RATIO, URINE, RANDOM: 0.11 mg/mg{creat} — ABNORMAL HIGH (ref <0.10)

## 2020-02-15 LAB — TACROLIMUS LEVEL     (BH GH L LMW YH): BKR TACROLIMUS BLOOD: 9.8 ng/mL

## 2020-02-15 LAB — MAGNESIUM: BKR MAGNESIUM: 1.9 mg/dL (ref 1.7–2.4)

## 2020-02-15 LAB — PHOSPHORUS     (BH GH L LMW YH): BKR PHOSPHORUS: 3.6 mg/dL (ref 2.2–4.5)

## 2020-02-16 LAB — CYTOMEGALOVIRUS QUANTITATIVE PCR, PLASMA     (BH GH L LMW YH)
BKR CMV QUANT (COPIES): <137 IU/mL
BKR CMV QUANT (LOG10): <2.14 Log IU/mL
BKR CMV QUANTITATIVE PCR: DETECTED — AB

## 2020-02-18 LAB — BK VIRUS BY PCR, QUANTITATIVE, PLASMA: BKR BK VIRUS, QUANTITATIVE PCR, PLASMA: NOT DETECTED

## 2020-02-18 MED FILL — CARVEDILOL IMMEDIATE RELEASE 25 MG TABLET: 25 mg | ORAL | 30 days supply | Qty: 60 | Fill #3 | Status: CP

## 2020-02-18 MED FILL — PREDNISONE 5 MG TABLET: 5 mg | ORAL | 30 days supply | Qty: 30 | Fill #1 | Status: CP

## 2020-02-18 MED FILL — MAGNESIUM OXIDE 400 MG (241.3 MG MAGNESIUM) TABLET: 400 mg (241.3 mg magnesium) | ORAL | 30 days supply | Qty: 60 | Fill #2 | Status: CP

## 2020-02-18 MED FILL — AMLODIPINE 10 MG TABLET: 10 mg | ORAL | 90 days supply | Qty: 90 | Fill #1 | Status: CP

## 2020-02-18 MED FILL — TACROLIMUS XR 4 MG TABLET,EXTENDED RELEASE 24 HR: 4 mg | ORAL | 30 days supply | Qty: 90 | Fill #2 | Status: CP

## 2020-02-18 MED FILL — ATOVAQUONE 750 MG/5 ML ORAL SUSPENSION: 750 mg/5 mL | ORAL | 21 days supply | Qty: 210 | Fill #3 | Status: CP

## 2020-02-29 ENCOUNTER — Ambulatory Visit: Admit: 2020-02-29 | Payer: PRIVATE HEALTH INSURANCE | Attending: Internal Medicine | Primary: Family Medicine

## 2020-02-29 ENCOUNTER — Inpatient Hospital Stay: Admit: 2020-02-29 | Discharge: 2020-02-29 | Payer: PRIVATE HEALTH INSURANCE | Primary: Family Medicine

## 2020-02-29 ENCOUNTER — Encounter
Admit: 2020-02-29 | Payer: PRIVATE HEALTH INSURANCE | Attending: Student in an Organized Health Care Education/Training Program | Primary: Family Medicine

## 2020-02-29 DIAGNOSIS — N2581 Secondary hyperparathyroidism of renal origin: Secondary | ICD-10-CM

## 2020-02-29 DIAGNOSIS — Z298 Encounter for other specified prophylactic measures: Secondary | ICD-10-CM

## 2020-02-29 DIAGNOSIS — K7460 Unspecified cirrhosis of liver: Secondary | ICD-10-CM

## 2020-02-29 DIAGNOSIS — Z2989 Need for prophylactic immunotherapy: Secondary | ICD-10-CM

## 2020-02-29 DIAGNOSIS — G4733 Obstructive sleep apnea (adult) (pediatric): Secondary | ICD-10-CM

## 2020-02-29 DIAGNOSIS — I770 Arteriovenous fistula, acquired: Secondary | ICD-10-CM

## 2020-02-29 DIAGNOSIS — Z940 Kidney transplant status: Secondary | ICD-10-CM

## 2020-02-29 DIAGNOSIS — T827XXA Infection and inflammatory reaction due to other cardiac and vascular devices, implants and grafts, initial encounter: Secondary | ICD-10-CM

## 2020-02-29 DIAGNOSIS — I712 Thoracic aortic aneurysm (HC Code): Secondary | ICD-10-CM

## 2020-02-29 DIAGNOSIS — D649 Anemia, unspecified: Secondary | ICD-10-CM

## 2020-02-29 DIAGNOSIS — Z796 Long-term use of immunosuppressant medication: Secondary | ICD-10-CM

## 2020-02-29 DIAGNOSIS — Z79899 Other long term (current) drug therapy: Secondary | ICD-10-CM

## 2020-02-29 DIAGNOSIS — N186 End stage renal disease: Secondary | ICD-10-CM

## 2020-02-29 DIAGNOSIS — E213 Hyperparathyroidism, unspecified: Secondary | ICD-10-CM

## 2020-02-29 DIAGNOSIS — J101 Influenza due to other identified influenza virus with other respiratory manifestations: Secondary | ICD-10-CM

## 2020-02-29 DIAGNOSIS — I10 Essential (primary) hypertension: Secondary | ICD-10-CM

## 2020-02-29 DIAGNOSIS — K219 Gastro-esophageal reflux disease without esophagitis: Secondary | ICD-10-CM

## 2020-02-29 DIAGNOSIS — E7800 Pure hypercholesterolemia, unspecified: Secondary | ICD-10-CM

## 2020-02-29 LAB — MANUAL DIFFERENTIAL
BKR WAM BASOPHIL - ABS (DIFF) 2 DEC: 0 x 1000/??L (ref 0.00–1.00)
BKR WAM BASOPHILS (DIFF): 0 % (ref 0.0–1.4)
BKR WAM EOSINOPHILS (DIFF) 2 DEC: 0.05 x 1000/ÂµL (ref 0.00–1.00)
BKR WAM EOSINOPHILS (DIFF): 1.8 % (ref 0.0–5.0)
BKR WAM LYMPHOCYTE - ABS (DIFF) 2 DEC: 0.15 x 1000/??L — ABNORMAL LOW (ref 0.60–3.70)
BKR WAM LYMPHOCYTES (DIFF): 5.2 % — ABNORMAL LOW (ref 17.0–50.0)
BKR WAM MONOCYTE - ABS (DIFF) 2 DEC: 0.05 x 1000/??L (ref 0.00–1.00)
BKR WAM MONOCYTES (DIFF): 1.7 % — ABNORMAL LOW (ref 4.0–12.0)
BKR WAM NEUTROPHILS (DIFF): 83.5 % — ABNORMAL HIGH (ref 39.0–72.0)
BKR WAM NEUTROPHILS - ABS (DIFF) 2 DEC: 2.56 x 1000/??L (ref 2.00–7.60)
BKR WAM NORMAL RBC MORPHOLOGY: NORMAL

## 2020-02-29 LAB — BASIC METABOLIC PANEL
BKR ANION GAP: 14 (ref 7–17)
BKR BLOOD UREA NITROGEN: 20 mg/dL (ref 6–20)
BKR BUN / CREAT RATIO: 9 (ref 8.0–23.0)
BKR CALCIUM: 9.2 mg/dL (ref 8.8–10.2)
BKR CHLORIDE: 99 mmol/L — ABNORMAL HIGH (ref 98–107)
BKR CO2: 28 mmol/L (ref 20–30)
BKR CREATININE: 2.21 mg/dL — ABNORMAL HIGH (ref 0.40–1.30)
BKR EGFR (AFR AMER): 37 mL/min/{1.73_m2} (ref >60)
BKR EGFR (NON AFRICAN AMERICAN): 31 mL/min/1.73m2 (ref >60)
BKR GLUCOSE: 110 mg/dL — ABNORMAL HIGH (ref 70–100)
BKR POTASSIUM: 3.6 mmol/L (ref 3.3–5.3)
BKR SODIUM: 141 mmol/L (ref 136–144)
BKR WAM BANDS (DIFF) (1 DEC): 2.21 mg/dL — ABNORMAL HIGH (ref 0.40–1.30)

## 2020-02-29 LAB — CBC WITH AUTO DIFFERENTIAL
BKR WAM ABSOLUTE NRBC (2 DEC): 0 x 1000/ÂµL (ref 0.00–1.00)
BKR WAM HEMATOCRIT (2 DEC): 37.6 % — ABNORMAL LOW (ref 38.50–50.00)
BKR WAM HEMOGLOBIN: 12.4 g/dL — ABNORMAL LOW (ref 13.2–17.1)
BKR WAM MCH (PG): 31.3 pg (ref 27.0–33.0)
BKR WAM MCHC: 33 g/dL (ref 31.0–36.0)
BKR WAM MCV: 94.9 fL (ref 80.0–100.0)
BKR WAM MPV: 9.8 fL (ref 8.0–12.0)
BKR WAM NUCLEATED RED BLOOD CELLS: 0 % (ref 0.0–1.0)
BKR WAM PLATELETS: 195 x1000/ÂµL (ref 150–420)
BKR WAM RDW-CV: 12.7 % — ABNORMAL HIGH (ref 11.0–15.0)
BKR WAM RED BLOOD CELL COUNT.: 3.96 M/ÂµL — ABNORMAL LOW (ref 4.00–6.00)
BKR WAM WHITE BLOOD CELL COUNT: 2.8 x1000/ÂµL — ABNORMAL LOW (ref 4.0–11.0)

## 2020-02-29 LAB — PHOSPHORUS     (BH GH L LMW YH): BKR PHOSPHORUS: 4.1 mg/dL (ref 2.2–4.5)

## 2020-02-29 LAB — MAGNESIUM
BKR MAGNESIUM: 1.7 mg/dL (ref 1.7–2.4)
BKR WAM PLATELETS: 1.7 mg/dL — ABNORMAL HIGH (ref 1.7–2.4)

## 2020-02-29 LAB — TACROLIMUS LEVEL     (BH GH L LMW YH): BKR TACROLIMUS BLOOD: 7.7 ng/mL

## 2020-03-01 LAB — CYTOMEGALOVIRUS QUANTITATIVE PCR, PLASMA     (BH GH L LMW YH): BKR CMV QUANTITATIVE PCR: NOT DETECTED

## 2020-03-03 LAB — BK VIRUS BY PCR, QUANTITATIVE, PLASMA: BKR BK VIRUS, QUANTITATIVE PCR, PLASMA: NOT DETECTED

## 2020-03-03 NOTE — Progress Notes
Nursing Assessment Follow Up Visit: Kidney Transplant Program Subjective: Adam Harper (59 y.o. 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 Denies fever, chills, chest pain, shortness of breath, or abdominal pain. No hematuria or dysuria noted by Adam Harper. Urinating without symptoms of obstruction or infection. Reports compliance with current medications.IS:Env 1g/1gPred 5Current Transplant Immunosuppression:Immunosuppression     Start End  mycophenolate mofetil (CELLCEPT) 250 mg capsule 11/20/2019   Sig - Route: Take 4 capsules (1,000 mg total) by mouth 2 (two) times daily. - Oral  Notes to Pharmacy: Kidney Transplant Z94.0  predniSONE (DELTASONE) 5 mg tablet 01/18/2020   Sig - Route: Take 1 tablet (5 mg total) by mouth daily. Take with food. - Oral  Notes to Pharmacy: Kidney Transplant Z94.0  tacrolimus XR (ENVARSUS XR) 4 mg 24 hr tablet 12/27/2019   Sig - Route: Take 3 tablets (12 mg total) by mouth daily. - Oral  Notes to Pharmacy: z94.0  Transplant Prophylaxis       atovaquone (MEPRON) 750 mg/5 mL suspension Take 10 mLs (1,500 mg total) by mouth daily with dinner.  Number of times this order has been changed since signing: 39   Order Audit Trail     No Known AllergiesObjective: Vital Signs: BP 134/64 (Site: l a, Position: Sitting, Cuff Size: Large)  - Pulse 76  - Temp 98.4 ?F (36.9 ?C) (Temporal)  - Wt 115.7 kg  - SpO2 95%  - BMI 37.66 kg/m?  Most recent creatinine: Lab Results Component Value Date  CREATININE 2.21 (H) 02/29/2020 Lab Results Component Value Date  TACROLIMUS 9.8 02/15/2020 Envarsus level is a 24 hour trough.Transplant graft is functioning. Assessment: Adam Harper is a 59 y.o. male here today for follow-up of kidney transplant. Continues with immunosuppression regimen and is at risk for infection.The patient has not had any hospitalizations since their last visit. Physical Capacity: No LimitationsKarnofsky Status: 80% - Normal Activity with Effort: Some Symptoms of DiseaseEmployment Status: No; DisabilityThe patient has not developed diabetes since their last visit. The patient has not had dialysis since their last follow-up. The patient has not had organ transplant rejection since their last follow-up. The patient has not had any evidence of malignancy since their last follow-up. Plan: Review labs with clinic attending. Adam Harper will be notified of any immunosuppression changes/treatments based on lab results.Adam Harper was provided education on maintaining medication compliance and post-transplant care. Patient was advised to contact the transplant center with any concerns. The patient verbalized understanding and intent to comply with the plan of care. Electronically Signed by [place] Herberth Deharo, RN, February 29, 2020

## 2020-03-03 NOTE — Progress Notes
[facility]  KIDNEY/PANCREAS TRANSPLANT PROGRAM  MEDICAL FOLLOW UP VISIT     CHIEF COMPLAINT/REASON FOR VISIT     Adam Harper is seen in clinic to for a follow up visit regarding:  - Evaluation of kidney allograft function   - Assessment of immunosuppression medication efficacy and side effects  - Assessment and management of blood pressure    TRANSPLANT HISTORY     ? 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 CMV D + / R +   ? Donor Toxo positive   ? CPRA zero, Alemtuzumab   ? 9/4-9/7: admit for hyperK+, urgent HD. Bactrim switched to Atovaquone    INTERIM HISTORY      Adam Harper comes in for routine follow-up of his allograft function. He is doing very well overall, denies any complaints. No new symptoms. Denies trouble urinating, fevers, chills, swelling in his legs. Endorses good compliance to his immunosuppression regimen. Blood pressure is well-controlled in the 130s mostly. Does complain of shortness of breath and waking up in the middle of the night - has not been wearing his CPAP and missed his appt at the sleep clinic. Encouraged to call and make a follow-up appt.     REVIEW OF SYSTEMS      General: no generalized weakness, fatigue, fever or chills  Cardio: no chest pain, shortness of breath bipedal edema  Pulm: no cough, sputum production or wheezing  GI: no nausea, vomiting, diarrhea or constipation   GU: no dysuria or hematuria  Skin: no new rashes or skin lesions  Neuro: no focal weakness, tremors, speech disturbance    MEDICAL HISTORY     Past Medical History:   Diagnosis Date   ? A-V fistula (HC Code) (HC CODE)     right arm   ? Anemia    ? Anemia in ESRD (end-stage renal disease) (HC Code) 10/24/2014    Added automatically from request for surgery [id number]  Added automatically from request for surgery [id number] Overview:  Added automatically from request for surgery [id number]   ? Anemia in ESRD (end-stage renal disease) (HC Code) (HC CODE) 10/24/2014 Formatting of this note might be different from the original. Overview:  Added automatically from request for surgery [id number] Overview:  Added automatically from request for surgery [id number]   ? AV fistula infection (HC Code) (HC CODE)    ? Cirrhosis (HC Code) (HC CODE)    ? ESRD on hemodialysis (HC Code) (HC CODE)     Mon-Wed-Fri   ? GERD (gastroesophageal reflux disease)    ? Hypercholesteremia    ? Hyperparathyroidism (HC Code) (HC CODE)    ? Hypertension    ? Infected prosthetic vascular graft, initial encounter (HC Code) (HC CODE) 10/24/2014   ? Influenza A    ? Morbid obesity (HC Code) (HC CODE) 02/03/2018   ? Obstructive sleep apnea     pt uses CPAP   ? Secondary hyperparathyroidism of renal origin (HC Code) (HC CODE)    ? Thoracic aortic aneurysm (HC Code) (HC CODE)       Past Surgical History:   Procedure Laterality Date   ? AV FISTULA PLACEMENT Left     left lower arm unsuccessful, then moved to upper arm 2011, then had problems in 10/2013 - infection      No family history on file.   Social History     Tobacco Use   ? Smoking status: Former Smoker  Packs/day: 0.25     Years: 25.00     Pack years: 6.25     Types: Cigarettes   ? Smokeless tobacco: Never Used   Substance Use Topics   ? Alcohol use: No        Past medical, surgical, family and social history reviewed. No changes at this time.    MEDICATIONS     Outpatient Encounter Medications as of 02/29/2020   Medication Sig   ? amLODIPine (NORVASC) 10 mg tablet Take 1 tablet (10 mg total) by mouth every morning.   ? atorvastatin (LIPITOR) 40 mg tablet Take 1 tablet (40 mg total) by mouth daily.   ? atovaquone (MEPRON) 750 mg/5 mL suspension Take 10 mLs (1,500 mg total) by mouth daily with dinner.   ? carvediloL (COREG) 25 mg Immediate Release tablet Take 1 tablet (25 mg total) by mouth 2 (two) times daily with breakfast and dinner.   ? chlorthalidone (HYGROTEN) 50 mg tablet Take 1 tablet (50 mg total) by mouth daily.   ? ergocalciferol (ERGOCALCIFEROL) 1,250 mcg (50,000 unit) capsule Take 1 capsule (50,000 Units total) by mouth once a week.   ? magnesium oxide (MAG-OX) 400 mg (241.3 mg magnesium) tablet Take 1 tablet (400 mg total) by mouth 2 (two) times daily.   ? mycophenolate mofetil (CELLCEPT) 250 mg capsule Take 4 capsules (1,000 mg total) by mouth 2 (two) times daily.   ? omeprazole (PRILOSEC) 20 mg capsule Take 1 capsule (20 mg total) by mouth daily.   ? predniSONE (DELTASONE) 5 mg tablet Take 1 tablet (5 mg total) by mouth daily. Take with food.   ? tacrolimus XR (ENVARSUS XR) 4 mg 24 hr tablet Take 3 tablets (12 mg total) by mouth daily.     No Known Allergies    PHYSICAL EXAM      BP 134/64 (Site: l a, Position: Sitting, Cuff Size: Large)  - Pulse 76  - Temp 98.4 ?F (36.9 ?C) (Temporal)  - Wt 115.7 kg  - SpO2 95%  - BMI 37.66 kg/m?   Wt Readings from Last 4 Encounters:   02/29/20 115.7 kg   01/31/20 114.3 kg   01/17/20 115.1 kg   01/03/20 113.7 kg       Gen: not in acute distress  HEENT: atraumatic, pale palpebral conjunctiva  Neck: no cervical lymphadenopathy, trachea midline  CV: regular rate and rhythm, distinct S1 and S2, no murmurs appreciated   Resp: clear breath sounds bilaterally, no wheezing or crackles appreciated  Abd: non-distended, normoactive bowel sounds, non-tender to palpation  Ext: no pedal edema  Neuro: Awake, alert, no tremors, motor function grossly intact    PERTINENT LABS     Lab Results   Component Value Date    WBC 2.8 (L) 02/29/2020    HGB 12.4 (L) 02/29/2020    HCT 37.60 (L) 02/29/2020    PLT 195 02/29/2020    GLU 110 (H) 02/29/2020    BUN 20 02/29/2020    CREATININE 2.21 (H) 02/29/2020    CO2 28 02/29/2020    CL 99 02/29/2020    NA 141 02/29/2020    K 3.6 02/29/2020    CALCIUM 9.2 02/29/2020    EGFR 8 06/21/2012    PRCRRAU 0.11 (H) 02/15/2020    PHOS 4.1 02/29/2020    PTH 37.5 12/06/2019    VITAMIND25HY 26 11/12/2009    HGBA1C 5.7 (H) 11/05/2019    IRON 80 01/30/2020    TIBC 275 01/30/2020    FERRITIN  1,163 (H) 01/30/2020 Lab Results   Component Value Date    TACROLIMUS 7.7 02/29/2020     ASSESSMENT AND PLAN     Adam Harper is seen in clinic today for a follow up visit regarding the patient's renal transplant.    ESRD S/P KT  Baseline Cr in the 2 range; 2.2 on most recent check  Lytes and acid/base stable without issues    Immunosuppression  Env 12 mg, MMF 1 g BID and prednisone 5 mg  Tac level at goal of 7.7; no changes to IS today    Prophylaxis and Surveillance  CMV transiently detected on 12/3 but ND on 12/17  BK ND 12/3  Remains on Mepron for PJP ppx    Blood Pressure management  Overall well-controlled; no changes to meds today    F/U in 1 month    Lyman Bishop, MBBS  Transplant Nephrology Fellow

## 2020-03-12 MED FILL — ATOVAQUONE 750 MG/5 ML ORAL SUSPENSION: 750 mg/5 mL | ORAL | 21 days supply | Qty: 210 | Fill #4 | Status: CP

## 2020-03-12 MED FILL — ATORVASTATIN 40 MG TABLET: 40 mg | ORAL | 30 days supply | Qty: 30 | Fill #3 | Status: CP

## 2020-03-12 MED FILL — MYCOPHENOLATE MOFETIL 250 MG CAPSULE: 250 mg | ORAL | 30 days supply | Qty: 240 | Fill #3 | Status: CP

## 2020-03-12 MED FILL — PREDNISONE 5 MG TABLET: 5 mg | ORAL | 30 days supply | Qty: 30 | Fill #2 | Status: CP

## 2020-03-14 ENCOUNTER — Encounter: Admit: 2020-03-14 | Payer: PRIVATE HEALTH INSURANCE | Attending: Nephrology | Primary: Family Medicine

## 2020-03-15 ENCOUNTER — Encounter: Admit: 2020-03-15 | Payer: PRIVATE HEALTH INSURANCE | Primary: Family Medicine

## 2020-03-15 DIAGNOSIS — Z796 Long-term use of immunosuppressant medication: Secondary | ICD-10-CM

## 2020-03-15 DIAGNOSIS — Z940 Kidney transplant status: Secondary | ICD-10-CM

## 2020-03-15 NOTE — Progress Notes
MD unavailable, verbal obtained from Dr. Chapman Moss orders placed.

## 2020-03-15 NOTE — Progress Notes
EXTERNAL EMAIL:   Do NOT click links or open attachments unless you trust the sender AND know the content is safe.  ----------------------------------------------------------------------LogEvent: [name] #: [id number] Sent: 03/14/2020 14:11 MTCaller Name: Marin Roberts: [phone] Ext: From: [place] Out patient pharmacyMessage Type: General MessageMessage: Pt: Name: Adam Harper: September 02, 1962MD: Dr. Racheal Patches are seen at [facility] transplant in [place], correct? Yes Post KidneyRE: patient is out of Vit D and Valcyte and needs filled today.  [facility] # [phone] [facility] and informed him that if he finished the vitamin D treatment (3 months). He can take vitamin D 1000 units daily that he can buy OTC and we will check his Vit D level when he comes to see [place]. I also advised him that he no longer needs to be on Valcyte since he completed the 3 months prophylaxis. I will order a Vit D level and CMV PCR for next apt.  Pt agreed with plan.

## 2020-03-16 MED ORDER — ERGOCALCIFEROL (VITAMIN D2) 1,250 MCG (50,000 UNIT) CAPSULE
1250 mcg (50,000 unit) | ORAL_CAPSULE | ORAL | 1 refills | 84 days | Status: AC
Start: 2020-03-16 — End: 2020-05-08
  Filled 2020-03-18: qty 12, 84d supply, fill #0

## 2020-03-19 ENCOUNTER — Ambulatory Visit: Admit: 2020-03-19 | Payer: PRIVATE HEALTH INSURANCE | Attending: Cardiovascular Disease | Primary: Family Medicine

## 2020-03-20 ENCOUNTER — Ambulatory Visit: Admit: 2020-03-20 | Payer: PRIVATE HEALTH INSURANCE | Attending: Internal Medicine | Primary: Family Medicine

## 2020-03-20 DIAGNOSIS — Z20822 Contact with and (suspected) exposure to covid-19: Secondary | ICD-10-CM

## 2020-03-25 MED FILL — TACROLIMUS XR 4 MG TABLET,EXTENDED RELEASE 24 HR: 4 mg | ORAL | 30 days supply | Qty: 90 | Fill #3 | Status: CP

## 2020-03-25 MED FILL — MAGNESIUM OXIDE 400 MG (241.3 MG MAGNESIUM) TABLET: 400 mg (241.3 mg magnesium) | ORAL | 30 days supply | Qty: 60 | Fill #3 | Status: CP

## 2020-03-25 MED FILL — CARVEDILOL IMMEDIATE RELEASE 25 MG TABLET: 25 mg | ORAL | 30 days supply | Qty: 60 | Fill #4 | Status: CP

## 2020-04-01 ENCOUNTER — Telehealth: Admit: 2020-04-01 | Payer: PRIVATE HEALTH INSURANCE | Primary: Family Medicine

## 2020-04-01 ENCOUNTER — Encounter: Admit: 2020-04-01 | Payer: PRIVATE HEALTH INSURANCE | Primary: Family Medicine

## 2020-04-01 DIAGNOSIS — Z940 Kidney transplant status: Secondary | ICD-10-CM

## 2020-04-01 DIAGNOSIS — U071 COVID: Secondary | ICD-10-CM

## 2020-04-01 MED ORDER — MOLNUPIRAVIR 200 MG CAPSULE (EUA)
200 mg | ORAL_CAPSULE | Freq: Two times a day (BID) | ORAL | 1 refills | 5.00 days | Status: AC
Start: 2020-04-01 — End: ?
  Filled 2020-04-01: qty 40, 5d supply, fill #0
  Filled 2020-04-01: qty 40, 5d supply, fill #1

## 2020-04-01 NOTE — Progress Notes
Script pended to Dr. Chapman Moss for molnupiravir for COVID positive status.

## 2020-04-01 NOTE — Telephone Encounter
LogEventMarland Kitchen [name] #: [id number] Sent: 03/31/2020 09:55 MTCaller Name: Ishan Sanroman: [phone] MWN:UUVO: Post KidneyMessage Type: General MessageMessage: Pt: Name: Adam Harper: [birth year]MD:You are seen at [facility] transplant in [place], correct?Post:Organ Type: KidneyRE:*via Research officer, [employer]* Patient tested positive for COVID yesterday (took 2 at home tests).  His symptoms are sore throat, muscle pain around ribs.  Does not have SOB, fever or severe pain.  Spanish interpreter: [phone] Called and spoke with pt with spanish interperter. Per pt, he is having mild symptoms of cough, however his biggest concerns are muscle pain. He endorses a fall yesterday off of a chair he was standing on. He did not hit his head or lose consciousness. He did not want to seek further medical assistance for this issue upon recommendation to reach out to PCP or proceed to ED with worsening symptoms. Pt notes he has sore throat and cold-like symptoms with no fever. Advised pt to hold MMF for 1 week. Discussed this at length to confirm pt would NOT hold Env and pred as well. Discussed with teach-back. Agreed that this RN will call to remind pt to restart medication. Pt confirmed understanding, as well as his spouse. Pt is agreeable to [facility] treatment as well. Will pend script. Will change visit on 1/20 to virtual. Pt preferred to utilize video.

## 2020-04-03 ENCOUNTER — Telehealth: Admit: 2020-04-03 | Payer: PRIVATE HEALTH INSURANCE | Primary: Family Medicine

## 2020-04-03 ENCOUNTER — Ambulatory Visit
Admit: 2020-04-03 | Payer: PRIVATE HEALTH INSURANCE | Attending: Student in an Organized Health Care Education/Training Program | Primary: Family Medicine

## 2020-04-03 ENCOUNTER — Ambulatory Visit: Admit: 2020-04-03 | Payer: PRIVATE HEALTH INSURANCE | Attending: Nephrology | Primary: Family Medicine

## 2020-04-03 DIAGNOSIS — I10 Essential (primary) hypertension: Secondary | ICD-10-CM

## 2020-04-03 DIAGNOSIS — Z796 Long-term use of immunosuppressant medication: Secondary | ICD-10-CM

## 2020-04-03 DIAGNOSIS — Z940 Kidney transplant status: Secondary | ICD-10-CM

## 2020-04-03 NOTE — Progress Notes
Adam Harper TRANSPLANT CENTERKIDNEY/PANCREAS TRANSPLANT PROGRAMTELEMEDICINE PHONE CONSULTATION This was a telephone visit conducted during the COVID-19 pandemic.Encounter Date: 01/20/22Primary Care Provider: [name] INTERIM HISTORY Adam Harper is being evaluated over telephone visit for allograft function.Blood pressures are well controlled. BP 122/82 mm Hg when he checked during the visit. Diagnosed with COVID-19 infection with a positive test 3 days ago; fortunately symptoms are mild and he has been taking molnupiravir prescribed to him without any issues. Having some myalgias still that are improving. Denies SOB, chest pain, difficulty urinating, diarrhea, LE swelling. Reports some dark colored urine without BRIEF TRANSPLANT HISTORY ?	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	CMV D + / R + ?	Donor Toxo positive ?	CPRA zero, Alemtuzumab ?	9/4-9/7: admit for hyperK+, urgent HD. Bactrim switched to AtovaquoneMEDICATION REVIEW Outpatient Encounter Medications as of 04/03/2020 Medication Sig ? amLODIPine (NORVASC) 10 mg tablet Take 1 tablet (10 mg total) by mouth every morning. ? atorvastatin (LIPITOR) 40 mg tablet Take 1 tablet (40 mg total) by mouth daily. ? atovaquone (MEPRON) 750 mg/5 mL suspension Take 10 mLs (1,500 mg total) by mouth daily with dinner. ? carvediloL (COREG) 25 mg Immediate Release tablet Take 1 tablet (25 mg total) by mouth 2 (two) times daily with breakfast and dinner. ? chlorthalidone (HYGROTEN) 50 mg tablet Take 1 tablet (50 mg total) by mouth daily. ? ergocalciferol (VITAMIN D2) 1,250 mcg (50,000 unit) capsule Take 1 capsule (50,000 Units total) by mouth once a week. ? magnesium oxide (MAG-OX) 400 mg (241.3 mg magnesium) tablet Take 1 tablet (400 mg total) by mouth 2 (two) times daily. ? molnupiravir 200 mg capsule (EUA) Take 4 capsules by mouth every 12 (twelve) hours for 5 days. ? mycophenolate mofetil (CELLCEPT) 250 mg capsule Take 4 capsules (1,000 mg total) by mouth 2 (two) times daily. ? omeprazole (PRILOSEC) 20 mg capsule Take 1 capsule (20 mg total) by mouth daily. ? predniSONE (DELTASONE) 5 mg tablet Take 1 tablet (5 mg total) by mouth daily. Take with food. ? tacrolimus XR (ENVARSUS XR) 4 mg 24 hr tablet Take 3 tablets (12 mg total) by mouth daily. No Known AllergiesMEDICAL HISTORY Past Medical History: Diagnosis Date ? A-V fistula (HC Code) (HC CODE)   right arm ? Anemia  ? Anemia in ESRD (end-stage renal disease) (HC Code) 10/24/2014  Added automatically from request for surgery [id number]  Added automatically from request for surgery [id number] Overview:  Added automatically from request for surgery [id number] ? Anemia in ESRD (end-stage renal disease) (HC Code) (HC CODE) 10/24/2014  Formatting of this note might be different from the original. Overview:  Added automatically from request for surgery [id number] Overview:  Added automatically from request for surgery [id number] ? 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 Cirrhosis (HC Code) (HC CODE)  ? ESRD on hemodialysis (HC Code) (HC CODE)   Mon-Wed-Fri ? GERD (gastroesophageal reflux disease)  ? Hypercholesteremia  ? Hyperparathyroidism (HC Code) (HC CODE)  ? Hypertension  ? Infected prosthetic vascular graft, initial encounter (HC Code) (HC CODE) 10/24/2014 ? Influenza A  ? Morbid obesity (HC Code) (HC CODE) 02/03/2018 ? Obstructive sleep apnea   pt uses CPAP ? Secondary hyperparathyroidism of renal origin (HC Code) (HC CODE)  ? Thoracic aortic aneurysm (HC Code) (HC CODE)   Past Surgical History: Procedure Laterality Date ? AV FISTULA PLACEMENT Left   left lower arm unsuccessful, then moved to upper arm 2011, then had problems in 10/2013 - infection  No family history on file. Social History  Tobacco Use ? Smoking status: Former Smoker   Packs/day: 0.25 Years: 25.00   Pack years: 6.25   Types: Cigarettes ? Smokeless tobacco: Never Used Substance Use Topics ? Alcohol use: No  Past medical, surgical, family and social history reviewed. No changes at this time. REVIEW OF PERTINENT LABS BP Readings from Last 3 Encounters: 02/29/20 134/64 01/31/20 124/73 01/17/20 (!) 157/84 Lab Results Component Value Date  WBC 2.8 (L) 02/29/2020  HGB 12.4 (L) 02/29/2020  HCT 37.60 (L) 02/29/2020  PLT 195 02/29/2020  GLU 110 (H) 02/29/2020  BUN 20 02/29/2020  CREATININE 2.21 (H) 02/29/2020  CO2 28 02/29/2020  CL 99 02/29/2020  NA 141 02/29/2020  K 3.6 02/29/2020  CALCIUM 9.2 02/29/2020  EGFR 8 06/21/2012  PRCRRAU 0.11 (H) 02/15/2020  PHOS 4.1 02/29/2020  PTH 37.5 12/06/2019  VITAMIND25HY 26 11/12/2009  HGBA1C 5.7 (H) 11/05/2019  IRON 80 01/30/2020  TIBC 275 01/30/2020  FERRITIN 1,163 (H) 01/30/2020 Lab Results Component Value Date  TACROLIMUS 7.7 02/29/2020 ASSESSMENT AND PLAN Adam Harper is a 60 yo M with 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 He tested positive for COVID 3 days ago and has only had mild symptoms; sore throat and myalgias. He was prescribed molnupiravir which he has been taking. He is otherwise doing well. 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 I gave them the [facility] hotline number if they have questions: 833-ASK-YNHH [phone] have reminded them of our office number should they need a more urgent appointment. TELEPHONE VISIT: For this visit the clinician and patient were present via telephone (audio only).Patient counseled on available options for visit type; Patient elected telephone visit; Patient consent given for telephone visit: YesPatient Identity was confirmed during this call.  Other individuals actively participating in the telephone encounter and their name/relation to the patient: noneTotal time spent in medical telephone consultation: 20 minsBecause this visit was completed over telephone, a hands-on physical exam was not performed.  Patient understands and knows to call back if condition changes. The visit type for this patient required modifications due to the COVID-19 outbreak.

## 2020-04-03 NOTE — Telephone Encounter
Called and spoke with pt with Spanish interpreter ID [phone]. Will move up visit today to 12:40 and change to phone visit. Pt agreeable to plan. Verbalized understanding.

## 2020-04-08 ENCOUNTER — Telehealth: Admit: 2020-04-08 | Payer: PRIVATE HEALTH INSURANCE | Attending: Advanced Practice Nursing | Primary: Family Medicine

## 2020-04-08 ENCOUNTER — Telehealth: Admit: 2020-04-08 | Payer: PRIVATE HEALTH INSURANCE | Primary: Family Medicine

## 2020-04-08 MED FILL — ATOVAQUONE 750 MG/5 ML ORAL SUSPENSION: 750 mg/5 mL | ORAL | 21 days supply | Qty: 210 | Fill #5 | Status: CP

## 2020-04-08 MED FILL — PREDNISONE 5 MG TABLET: 5 mg | ORAL | 30 days supply | Qty: 30 | Fill #3 | Status: CP

## 2020-04-08 MED FILL — ATORVASTATIN 40 MG TABLET: 40 mg | ORAL | 30 days supply | Qty: 30 | Fill #4 | Status: CP

## 2020-04-08 MED FILL — MYCOPHENOLATE MOFETIL 250 MG CAPSULE: 250 mg | ORAL | 30 days supply | Qty: 240 | Fill #4 | Status: CP

## 2020-04-08 MED FILL — CHLORTHALIDONE 50 MG TABLET: 50 mg | ORAL | 90 days supply | Qty: 90 | Fill #1 | Status: CP

## 2020-04-08 NOTE — Telephone Encounter
Pt's case reviewed with Dr. Chapman Moss. As pt is still symptomatic, will hold MMF an additional week. Called pt with Spanish interpreter ID: [phone]. Advised that he should continue holding MMF. Pt notes he is feeling well now, but typically doesn't feel well in the morning. His spouse notes pt takes 3 pills of tylenol 2-3 times daily depending on how he feels and always feels better after. Advised they follow bottle instructions and not take more than 3g tylenol daily. Spouse verbalized understanding of this. Will update team and follow up with pt in 1 week. Pt advised to call back with any changes in symptoms. He verbalized understanding.

## 2020-04-08 NOTE — Telephone Encounter
LogEventMarland Kitchen [name] #: [id number] Sent: 04/08/2020 06:44 MTCaller Name: Tymon Nemetz: [phone] ZSW:FUXN: post Caroline More Type: General MessageMessage: Pt: Name:Adam SantiagoDOB: [birth year]MD:You are seen at [facility] transplant in [place], correct? yesPre or Post: postOrgan Type: KidneyRE: He was Covid pos 8days ago, and is worse with a fever 100.1, feeling weak with SOBName of Pharmacy (if applicable):n/aSpanish interpretor ID #[id number] was used for the following encounter. Returned call to patient. Patient states he tested positive for COVID 8 days ago. He is now experiencing fever of 100.82F-100.16F since yesterday. He does not have SOB, he does have a cough in the morning. He denies N/V/D.  His s/o was on the phone as well and stated she checked his pulse ox this morning and it was 96%. He has finished his molnupiravir. Advised patient and s/o that it is not uncommon for him to start experiencing fever 8 days after being diagnosed with COVID since he is immunocompromised. Advised that he can take Tylenol for fever and if at any point he experiences SOB to proceed to the ED. Patient and s/o agree with plan and verbalized understanding.

## 2020-04-10 ENCOUNTER — Inpatient Hospital Stay: Admit: 2020-04-10 | Discharge: 2020-04-10 | Payer: PRIVATE HEALTH INSURANCE | Primary: Family Medicine

## 2020-04-10 DIAGNOSIS — Z796 Long-term use of immunosuppressant medication: Secondary | ICD-10-CM

## 2020-04-10 DIAGNOSIS — Z940 Kidney transplant status: Secondary | ICD-10-CM

## 2020-04-10 DIAGNOSIS — Z79899 Other long term (current) drug therapy: Secondary | ICD-10-CM

## 2020-04-10 LAB — CBC WITH AUTO DIFFERENTIAL
BKR WAM ABSOLUTE IMMATURE GRANULOCYTES.: 0.07 x 1000/ÂµL (ref 0.00–0.30)
BKR WAM ABSOLUTE LYMPHOCYTE COUNT.: 2.99 x 1000/??L (ref 0.60–3.70)
BKR WAM ABSOLUTE NRBC (2 DEC): 0 x 1000/ÂµL (ref 0.00–1.00)
BKR WAM ANALYZER ANC: 3.22 x 1000/ÂµL (ref 2.00–7.60)
BKR WAM BASOPHILS: 0.8 % (ref 0.0–1.4)
BKR WAM EOSINOPHIL ABSOLUTE COUNT.: 0.12 x 1000/??L — ABNORMAL LOW (ref 0.00–1.00)
BKR WAM EOSINOPHILS: 1.7 % (ref 0.0–5.0)
BKR WAM HEMATOCRIT (2 DEC): 40.7 % (ref 38.50–50.00)
BKR WAM HEMOGLOBIN: 12.8 g/dL — ABNORMAL LOW (ref 13.2–17.1)
BKR WAM IMMATURE GRANULOCYTES: 1 % (ref 0.0–1.0)
BKR WAM LYMPHOCYTES: 41.6 % (ref 17.0–50.0)
BKR WAM MCH (PG): 30 pg (ref 27.0–33.0)
BKR WAM MCHC: 31.4 g/dL (ref 31.0–36.0)
BKR WAM MCV: 95.5 fL (ref 80.0–100.0)
BKR WAM MONOCYTE ABSOLUTE COUNT.: 0.72 x 1000/ÂµL (ref 0.00–1.00)
BKR WAM MONOCYTES: 10 % (ref 4.0–12.0)
BKR WAM MPV: 10.3 fL (ref 8.0–12.0)
BKR WAM NEUTROPHILS: 44.9 % — ABNORMAL LOW (ref 39.0–72.0)
BKR WAM NUCLEATED RED BLOOD CELLS: 0 % (ref 0.0–1.0)
BKR WAM PLATELETS: 172 x1000/ÂµL (ref 150–420)
BKR WAM RDW-CV: 13 % (ref 11.0–15.0)
BKR WAM RED BLOOD CELL COUNT.: 4.26 M/??L (ref 4.00–6.00)
BKR WAM WHITE BLOOD CELL COUNT: 7.2 x1000/??L (ref 4.0–11.0)

## 2020-04-10 LAB — BASIC METABOLIC PANEL
BKR ANION GAP: 14 (ref 7–17)
BKR BLOOD UREA NITROGEN: 26 mg/dL — ABNORMAL HIGH (ref 6–20)
BKR BUN / CREAT RATIO: 9.6 mg/dL — ABNORMAL HIGH (ref 8.0–23.0)
BKR CALCIUM: 8.6 mg/dL — ABNORMAL LOW (ref 8.8–10.2)
BKR CHLORIDE: 96 mmol/L — ABNORMAL LOW (ref 98–107)
BKR CO2: 29 mmol/L (ref 20–30)
BKR CREATININE: 2.7 mg/dL — ABNORMAL HIGH (ref 0.40–1.30)
BKR EGFR (AFR AMER): 30 mL/min/{1.73_m2} (ref >60)
BKR EGFR (NON AFRICAN AMERICAN): 24 mL/min/{1.73_m2} (ref >60)
BKR GLUCOSE: 125 mg/dL — ABNORMAL HIGH (ref 70–100)
BKR POTASSIUM: 3.7 mmol/L (ref 3.3–5.3)
BKR SODIUM: 139 mmol/L (ref 136–144)

## 2020-04-10 LAB — PHOSPHORUS     (BH GH L LMW YH): BKR PHOSPHORUS: 3.7 mg/dL (ref 2.2–4.5)

## 2020-04-10 LAB — PROTEIN, TOTAL W/CREATININE, URINE, RANDOM     (BH GH LMW YH)
BKR CREATININE, URINE, RANDOM: 261 mg/dL
BKR PROTEIN URINE RANDOM: 0.94 g/L
BKR PROTEIN/CREATININE RATIO, URINE, RANDOM: 0.36 mg/mg Cr — ABNORMAL HIGH (ref <0.10)

## 2020-04-10 LAB — ALBUMIN/CREATININE PANEL, URINE, RANDOM
BKR ALBUMIN, URINE, RANDOM: 209.3 mg/L
BKR ALBUMIN/CREATININE RATIO, URINE, RANDOM: 80.2 mg/g{creat} — ABNORMAL HIGH (ref <30.0)
BKR CREATININE, URINE, RANDOM: 261 mg/dL

## 2020-04-10 LAB — MAGNESIUM: BKR MAGNESIUM: 1.6 mg/dL — ABNORMAL LOW (ref 1.7–2.4)

## 2020-04-11 ENCOUNTER — Ambulatory Visit: Admit: 2020-04-11 | Payer: PRIVATE HEALTH INSURANCE | Attending: Cardiovascular Disease | Primary: Family Medicine

## 2020-04-11 LAB — TACROLIMUS LEVEL     (BH GH L LMW YH): BKR TACROLIMUS BLOOD: 10.2 ng/mL

## 2020-04-11 LAB — BK VIRUS BY PCR, QUANTITATIVE, PLASMA: BKR BK VIRUS, QUANTITATIVE PCR, PLASMA: NOT DETECTED

## 2020-04-12 LAB — CYTOMEGALOVIRUS QUANTITATIVE PCR, PLASMA     (BH GH L LMW YH)
BKR CMV QUANT (COPIES): 1070 IU/mL
BKR CMV QUANT (LOG10): 3.03 {Log_IU}/mL
BKR CMV QUANTITATIVE PCR: DETECTED — AB

## 2020-04-14 ENCOUNTER — Telehealth: Admit: 2020-04-14 | Payer: PRIVATE HEALTH INSURANCE | Primary: Family Medicine

## 2020-04-14 NOTE — Telephone Encounter
Spanish interpreter ID #: [id number]. Pt with new log CMV 3.03. Reviewed with Dr. Chapman Moss. Will have pt continue off of MMF and lower Env to 8 mg daily. Plan for patient to repeat labs on Wednesday or Thursday. Called and spoke to pt with Spanish interpreter. Updated on plan above. Pt verbalized understanding in native language. Also discussed recent covid diagnosis and continuing symptoms. All questions answered.

## 2020-04-16 ENCOUNTER — Encounter: Admit: 2020-04-16 | Payer: PRIVATE HEALTH INSURANCE | Attending: Pharmacotherapy | Primary: Family Medicine

## 2020-04-16 DIAGNOSIS — Z940 Kidney transplant status: Secondary | ICD-10-CM

## 2020-04-16 LAB — VITAMIN D, 25-HYDROXY
BKR VITAMIN D, 25-HYDROXY TOTAL (YH): 46 ng/mL
BKR VITAMIN D2, 25-HYDROXY: 40 ng/mL
BKR VITAMIN D3, 25-HYDROXY: 6 ng/mL

## 2020-04-16 NOTE — Other
TRANSPLANT PHARMACY: EVUSHELD REFERRALLuis A Harper has been referred for possible EVUSHELD (tixagevimab co-packaged with cilgavimab) for COVID-19 pre-exposure prophylaxis.Patient's SARS-CoV-2 (COVID-19) Spike-RBD Antibody level will be assessed. If <210, patient will qualify for EVUSHELD.Orders placed and patient will be contacted for possible EVUSHELD pending test results.[name], PharmDFebruary 2, 20223:52 PM

## 2020-04-17 ENCOUNTER — Inpatient Hospital Stay: Admit: 2020-04-17 | Discharge: 2020-04-17 | Payer: PRIVATE HEALTH INSURANCE | Primary: Family Medicine

## 2020-04-17 ENCOUNTER — Encounter: Admit: 2020-04-17 | Payer: PRIVATE HEALTH INSURANCE | Primary: Family Medicine

## 2020-04-17 DIAGNOSIS — Z796 Long-term use of immunosuppressant medication: Secondary | ICD-10-CM

## 2020-04-17 DIAGNOSIS — Z940 Kidney transplant status: Secondary | ICD-10-CM

## 2020-04-17 DIAGNOSIS — Z79899 Other long term (current) drug therapy: Secondary | ICD-10-CM

## 2020-04-17 LAB — BASIC METABOLIC PANEL
BKR ANION GAP: 13 (ref 7–17)
BKR BLOOD UREA NITROGEN: 20 mg/dL (ref 6–20)
BKR BUN / CREAT RATIO: 9.3 % (ref 8.0–23.0)
BKR CHLORIDE: 101 mmol/L (ref 98–107)
BKR CO2: 29 mmol/L — AB (ref 20–30)
BKR CREATININE: 2.16 mg/dL — ABNORMAL HIGH (ref 0.40–1.30)
BKR EGFR (AFR AMER): 38 mL/min/{1.73_m2} (ref >60)
BKR EGFR (NON AFRICAN AMERICAN): 32 mL/min/{1.73_m2} (ref >60)
BKR GLUCOSE: 112 mg/dL — ABNORMAL HIGH (ref 70–100)
BKR POTASSIUM: 4.2 mmol/L (ref 3.3–5.3)
BKR SODIUM: 143 mmol/L (ref 136–144)

## 2020-04-17 LAB — CBC WITH AUTO DIFFERENTIAL
BKR CALCIUM: 38.4 % — ABNORMAL LOW (ref 39.0–72.0)
BKR WAM ABSOLUTE IMMATURE GRANULOCYTES.: 0.07 x 1000/??L (ref 0.00–0.30)
BKR WAM ABSOLUTE NRBC (2 DEC): 0 x 1000/??L (ref 0.00–1.00)
BKR WAM ANALYZER ANC: 2.01 x 1000/ÂµL (ref 2.00–7.60)
BKR WAM BASOPHIL ABSOLUTE COUNT.: 0.05 x 1000/??L (ref 0.00–1.00)
BKR WAM BASOPHIL ABSOLUTE COUNT.: 11.3 g/dL — ABNORMAL LOW (ref 13.2–17.1)
BKR WAM BASOPHILS: 1 % (ref 0.0–1.4)
BKR WAM EOSINOPHIL ABSOLUTE COUNT.: 0.17 x 1000/??L (ref 0.00–1.00)
BKR WAM EOSINOPHILS: 3.3 % (ref 0.0–5.0)
BKR WAM HEMATOCRIT (2 DEC): 36.4 % — ABNORMAL LOW (ref 38.50–50.00)
BKR WAM HEMOGLOBIN: 11.3 g/dL — ABNORMAL LOW (ref 13.2–17.1)
BKR WAM IMMATURE GRANULOCYTES: 1.3 % — ABNORMAL HIGH (ref 0.0–1.0)
BKR WAM LYMPHOCYTES: 44.1 % (ref 17.0–50.0)
BKR WAM MCH (PG): 30.5 pg (ref 27.0–33.0)
BKR WAM MCHC: 31 g/dL (ref 31.0–36.0)
BKR WAM MCV: 98.1 fL (ref 80.0–100.0)
BKR WAM MONOCYTE ABSOLUTE COUNT.: 0.62 x 1000/??L (ref 0.00–1.00)
BKR WAM MONOCYTES: 11.9 % (ref 4.0–12.0)
BKR WAM MPV: 10 fL (ref 8.0–12.0)
BKR WAM NEUTROPHILS: 38.4 % — ABNORMAL LOW (ref 39.0–72.0)
BKR WAM NUCLEATED RED BLOOD CELLS: 0 % (ref 0.0–1.0)
BKR WAM PLATELETS: 270 x1000/??L (ref 150–420)
BKR WAM RDW-CV: 12.8 % — ABNORMAL HIGH (ref 11.0–15.0)
BKR WAM RED BLOOD CELL COUNT.: 3.71 M/ÂµL — ABNORMAL LOW (ref 4.00–6.00)
BKR WAM WHITE BLOOD CELL COUNT: 5.2 x1000/ÂµL (ref 4.0–11.0)

## 2020-04-17 LAB — PROTEIN, TOTAL W/CREATININE, URINE, RANDOM     (BH GH LMW YH)
BKR CREATININE, URINE, RANDOM: 156 mg/dL (ref 0.00–1.00)
BKR PROTEIN URINE RANDOM: 0.39 g/L
BKR PROTEIN/CREATININE RATIO, URINE, RANDOM: 0.25 mg/mg Cr — ABNORMAL HIGH (ref <0.10)

## 2020-04-17 LAB — MAGNESIUM: BKR MAGNESIUM: 1.9 mg/dL (ref 1.7–2.4)

## 2020-04-17 LAB — ALBUMIN/CREATININE PANEL, URINE, RANDOM
BKR ALBUMIN, URINE, RANDOM: 93.6 mg/L (ref 0.0–1.0)
BKR ALBUMIN/CREATININE RATIO, URINE, RANDOM: 60 mg/g{creat} — ABNORMAL HIGH (ref <30.0)
BKR CREATININE, URINE, RANDOM: 156 mg/dL (ref 0.0–1.0)

## 2020-04-17 LAB — TACROLIMUS LEVEL     (BH GH L LMW YH): BKR TACROLIMUS BLOOD: 7.2 ng/mL

## 2020-04-17 LAB — PHOSPHORUS     (BH GH L LMW YH): BKR PHOSPHORUS: 4.6 mg/dL — ABNORMAL HIGH (ref 2.2–4.5)

## 2020-04-17 NOTE — Progress Notes
MD unavailable, verbal obtained from Dr. Chapman Moss, orders placed.

## 2020-04-18 ENCOUNTER — Encounter: Admit: 2020-04-18 | Payer: PRIVATE HEALTH INSURANCE | Primary: Family Medicine

## 2020-04-18 LAB — CYTOMEGALOVIRUS QUANTITATIVE PCR, PLASMA     (BH GH L LMW YH)
BKR CMV QUANT (COPIES): 996 IU/mL (ref 31.0–36.0)
BKR CMV QUANT (LOG10): 3 {Log_IU}/mL
BKR CMV QUANTITATIVE PCR: DETECTED — AB

## 2020-04-18 NOTE — Progress Notes
Labs reviewed with Dr. Chapman Moss, no changes to IS regimen. Will plan for pt to start completing weekly labs.

## 2020-04-21 ENCOUNTER — Telehealth: Admit: 2020-04-21 | Payer: PRIVATE HEALTH INSURANCE | Primary: Family Medicine

## 2020-04-21 ENCOUNTER — Encounter: Admit: 2020-04-21 | Payer: PRIVATE HEALTH INSURANCE | Primary: Family Medicine

## 2020-04-21 DIAGNOSIS — Z940 Kidney transplant status: Secondary | ICD-10-CM

## 2020-04-21 DIAGNOSIS — Z796 Long-term use of immunosuppressant medication: Secondary | ICD-10-CM

## 2020-04-21 NOTE — Telephone Encounter
Called patient to review plan for protocol biopsy. Procedure explained to patient. Letter will be sent with specific instructions to patient. Anticoagulation instructions:?	No blood thinners Patient will call IR with any scheduling issues and transplant with any biopsy related questions or concerns.

## 2020-04-24 ENCOUNTER — Encounter: Admit: 2020-04-24 | Payer: PRIVATE HEALTH INSURANCE | Attending: Pharmacotherapy | Primary: Family Medicine

## 2020-04-24 ENCOUNTER — Inpatient Hospital Stay: Admit: 2020-04-24 | Discharge: 2020-04-24 | Payer: PRIVATE HEALTH INSURANCE | Primary: Family Medicine

## 2020-04-24 DIAGNOSIS — Z0184 Encounter for antibody response examination: Secondary | ICD-10-CM

## 2020-04-24 DIAGNOSIS — Z79899 Other long term (current) drug therapy: Secondary | ICD-10-CM

## 2020-04-24 DIAGNOSIS — Z940 Kidney transplant status: Secondary | ICD-10-CM

## 2020-04-24 DIAGNOSIS — Z796 Long-term use of immunosuppressant medication: Secondary | ICD-10-CM

## 2020-04-24 LAB — CBC WITH AUTO DIFFERENTIAL
BKR WAM ABSOLUTE IMMATURE GRANULOCYTES.: 0.12 x 1000/??L (ref 0.00–0.30)
BKR WAM ABSOLUTE LYMPHOCYTE COUNT.: 2 x 1000/ÂµL (ref 0.60–3.70)
BKR WAM ABSOLUTE NRBC (2 DEC): 0 x 1000/??L (ref 0.00–1.00)
BKR WAM ANALYZER ANC: 2.43 x 1000/ÂµL (ref 2.00–7.60)
BKR WAM BASOPHILS: 1.3 % (ref 0.0–1.4)
BKR WAM EOSINOPHIL ABSOLUTE COUNT.: 0.14 x 1000/ÂµL (ref 0.00–1.00)
BKR WAM EOSINOPHILS: 2.6 % (ref 0.0–5.0)
BKR WAM HEMATOCRIT (2 DEC): 35.5 % — ABNORMAL LOW (ref 38.50–50.00)
BKR WAM HEMOGLOBIN: 11.3 g/dL — ABNORMAL LOW (ref 13.2–17.1)
BKR WAM IMMATURE GRANULOCYTES: 2.2 % — ABNORMAL HIGH (ref 0.0–1.0)
BKR WAM LYMPHOCYTES: 37 % (ref 17.0–50.0)
BKR WAM MCH (PG): 30.2 pg (ref 27.0–33.0)
BKR WAM MCHC: 31.8 g/dL (ref 31.0–36.0)
BKR WAM MCV: 94.9 fL (ref 80.0–100.0)
BKR WAM MONOCYTE ABSOLUTE COUNT.: 0.65 x 1000/ÂµL (ref 0.00–1.00)
BKR WAM MONOCYTES: 12 % (ref 4.0–12.0)
BKR WAM MPV: 9.7 fL (ref 8.0–12.0)
BKR WAM NEUTROPHILS: 44.9 % (ref 39.0–72.0)
BKR WAM NUCLEATED RED BLOOD CELLS: 0 % (ref 0.0–1.0)
BKR WAM PLATELETS: 272 x1000/??L (ref 150–420)
BKR WAM RDW-CV: 13.8 % (ref 11.0–15.0)
BKR WAM WHITE BLOOD CELL COUNT: 5.4 x1000/??L (ref 4.0–11.0)

## 2020-04-24 LAB — BK VIRUS BY PCR, QUANTITATIVE, PLASMA: BKR BK VIRUS, QUANTITATIVE PCR, PLASMA: NOT DETECTED Log IU/mL

## 2020-04-24 LAB — BASIC METABOLIC PANEL
BKR ANION GAP: 14 (ref 7–17)
BKR BLOOD UREA NITROGEN: 17 mg/dL (ref 6–20)
BKR BUN / CREAT RATIO: 9.1 % (ref 8.0–23.0)
BKR CALCIUM: 8.9 mg/dL (ref 8.8–10.2)
BKR CHLORIDE: 101 mmol/L (ref 98–107)
BKR CO2: 24 mmol/L (ref 20–30)
BKR CREATININE: 1.87 mg/dL — ABNORMAL HIGH (ref 0.40–1.30)
BKR EGFR (AFR AMER): 45 mL/min/1.73m2 (ref >60)
BKR EGFR (NON AFRICAN AMERICAN): 37 mL/min/{1.73_m2} (ref >60)
BKR GLUCOSE: 113 mg/dL — ABNORMAL HIGH (ref 70–100)
BKR POTASSIUM: 4 mmol/L (ref 3.3–5.3)
BKR SODIUM: 139 mmol/L (ref 136–144)

## 2020-04-24 LAB — MAGNESIUM: BKR MAGNESIUM: 1.6 mg/dL — ABNORMAL LOW (ref 1.7–2.4)

## 2020-04-24 LAB — SARS-COV-2 (COVID-19) SPIKE-RBD ANTIBODY (TOTAL IG), SEMI-QUANTITATIVE (BH GH LMW YH)
BKR SARS-COV-2 (COVID-19) SPIKE-RBD AB, TOTAL (ROCHE): >2500 U/mL — ABNORMAL HIGH (ref <0.80)
BKR SARS-COV-2 (COVID-19) SPIKE-RBD AB, TOTAL INTERP: POSITIVE x 1000/??L — AB (ref 2.00–7.60)
BKR WAM ABSOLUTE LYMPHOCYTE COUNT.: >2500 U/mL — ABNORMAL HIGH (ref <0.80)

## 2020-04-24 LAB — ALBUMIN/CREATININE PANEL, URINE, RANDOM
BKR ALBUMIN, URINE, RANDOM: 74.9 mg/L
BKR ALBUMIN/CREATININE RATIO, URINE, RANDOM: 66.5 mg/g{creat} — ABNORMAL HIGH (ref <30.0)
BKR CREATININE, URINE, RANDOM: 113 mg/dL

## 2020-04-24 LAB — PROTEIN, TOTAL W/CREATININE, URINE, RANDOM     (BH GH LMW YH)
BKR CREATININE, URINE, RANDOM: 113 mg/dL
BKR PROTEIN URINE RANDOM: 0.25 g/L
BKR PROTEIN/CREATININE RATIO, URINE, RANDOM: 0.22 mg/mg Cr — ABNORMAL HIGH (ref <0.10)

## 2020-04-24 LAB — TACROLIMUS LEVEL     (BH GH L LMW YH): BKR TACROLIMUS BLOOD: 4.3 ng/mL — ABNORMAL LOW

## 2020-04-24 NOTE — Other
TRANSPLANT PHARMACY: EVUSHELD REFERRALLuis A Harper has been referred for possible EVUSHELD (tixagevimab co-packaged with cilgavimab) for COVID-19 pre-exposure prophylaxis.Patient has received 2 mRNA doses against COVID-19 with SARS-CoV-2 (COVID-19) Spike-RBD Antibody (Total Ig) >210.Patient is NOT a candidate for EVUSHELD. Recommend patient complete vaccine series.[name], PharmDFebruary 10, 202212:49 PM

## 2020-04-25 ENCOUNTER — Encounter: Admit: 2020-04-25 | Payer: PRIVATE HEALTH INSURANCE | Attending: Nephrology | Primary: Family Medicine

## 2020-04-25 ENCOUNTER — Encounter: Admit: 2020-04-25 | Payer: PRIVATE HEALTH INSURANCE | Attending: Adult Health | Primary: Family Medicine

## 2020-04-25 DIAGNOSIS — Z940 Kidney transplant status: Secondary | ICD-10-CM

## 2020-04-25 DIAGNOSIS — Z79899 Other long term (current) drug therapy: Secondary | ICD-10-CM

## 2020-04-25 DIAGNOSIS — N186 End stage renal disease: Secondary | ICD-10-CM

## 2020-04-25 DIAGNOSIS — R790 Abnormal level of blood mineral: Secondary | ICD-10-CM

## 2020-04-25 LAB — CYTOMEGALOVIRUS QUANTITATIVE PCR, PLASMA     (BH GH L LMW YH)
BKR CMV QUANT (COPIES): 216 IU/mL
BKR CMV QUANT (LOG10): 2.33 {Log_IU}/mL
BKR CMV QUANTITATIVE PCR: DETECTED — AB

## 2020-04-25 MED ORDER — ATORVASTATIN 40 MG TABLET
40 mg | ORAL_TABLET | Freq: Every day | ORAL | 4 refills | 90.00 days | Status: AC
Start: 2020-04-25 — End: 2021-05-14
  Filled 2020-05-16: qty 90, 90d supply, fill #0
  Filled 2020-05-16: qty 90, 90d supply, fill #4

## 2020-04-25 MED ORDER — MAGNESIUM OXIDE 400 MG (241.3 MG MAGNESIUM) TABLET
400241.3 mg (241.3 mg magnesium) | ORAL_TABLET | Freq: Two times a day (BID) | ORAL | 4 refills | 30.00 days | Status: AC
Start: 2020-04-25 — End: 2020-09-16

## 2020-04-25 MED FILL — CARVEDILOL IMMEDIATE RELEASE 25 MG TABLET: 25 mg | ORAL | 30 days supply | Qty: 60 | Fill #5 | Status: CP

## 2020-04-25 MED FILL — TACROLIMUS XR 4 MG TABLET,EXTENDED RELEASE 24 HR: 4 mg | ORAL | 30 days supply | Qty: 90 | Fill #4 | Status: CP

## 2020-04-25 MED FILL — OMEPRAZOLE 20 MG CAPSULE,DELAYED RELEASE: 20 mg | ORAL | 90 days supply | Qty: 90 | Fill #1 | Status: CP

## 2020-04-25 MED FILL — OMEPRAZOLE 20 MG CAPSULE,DELAYED RELEASE: 20 mg | ORAL | 90 days supply | Qty: 90 | Fill #2 | Status: CP

## 2020-04-28 LAB — HLA POST TRANSPLANT DSA (YMG)
HLA CLASS I ANTIBODY TEST RESULT (S.A.): NEGATIVE
HLA CLASS II ANTIBODY TEST RESULT (S.A.): NEGATIVE

## 2020-04-29 ENCOUNTER — Encounter: Admit: 2020-04-29 | Payer: PRIVATE HEALTH INSURANCE | Attending: Nephrology | Primary: Family Medicine

## 2020-04-29 DIAGNOSIS — Z796 Long-term use of immunosuppressant medication: Secondary | ICD-10-CM

## 2020-04-29 DIAGNOSIS — Z940 Kidney transplant status: Secondary | ICD-10-CM

## 2020-04-29 DIAGNOSIS — R12 Heartburn: Secondary | ICD-10-CM

## 2020-05-01 MED ORDER — OMEPRAZOLE 20 MG CAPSULE,DELAYED RELEASE
20 mg | ORAL_CAPSULE | Freq: Every day | ORAL | 4 refills | 90.00 days | Status: AC
Start: 2020-05-01 — End: 2021-05-14
  Filled 2020-07-19: qty 90, 90d supply, fill #0

## 2020-05-01 MED FILL — MAGNESIUM OXIDE 400 MG (241.3 MG MAGNESIUM) TABLET: 400 mg (241.3 mg magnesium) | ORAL | 30 days supply | Qty: 60 | Fill #0 | Status: CP

## 2020-05-01 MED FILL — ATOVAQUONE 750 MG/5 ML ORAL SUSPENSION: 750 mg/5 mL | ORAL | 21 days supply | Qty: 210 | Fill #6 | Status: CP

## 2020-05-07 ENCOUNTER — Inpatient Hospital Stay: Admit: 2020-05-07 | Discharge: 2020-05-07 | Payer: PRIVATE HEALTH INSURANCE | Primary: Family Medicine

## 2020-05-07 DIAGNOSIS — Z79899 Other long term (current) drug therapy: Secondary | ICD-10-CM

## 2020-05-07 DIAGNOSIS — Z796 Long-term use of immunosuppressant medication: Secondary | ICD-10-CM

## 2020-05-07 DIAGNOSIS — Z940 Kidney transplant status: Secondary | ICD-10-CM

## 2020-05-07 LAB — BASIC METABOLIC PANEL
BKR ANION GAP: 14 g/dL (ref 7–17)
BKR BLOOD UREA NITROGEN: 18 mg/dL (ref 6–20)
BKR BUN / CREAT RATIO: 8.3 % (ref 8.0–23.0)
BKR CALCIUM: 9 mg/dL (ref 8.8–10.2)
BKR CHLORIDE: 98 mmol/L (ref 98–107)
BKR CO2: 28 mmol/L (ref 20–30)
BKR CREATININE: 2.18 mg/dL — ABNORMAL HIGH (ref 0.40–1.30)
BKR EGFR (AFR AMER): 38 mL/min/1.73m2 — ABNORMAL HIGH (ref >60)
BKR EGFR (NON AFRICAN AMERICAN): 31 mL/min/{1.73_m2} (ref >60)
BKR GLUCOSE: 105 mg/dL — ABNORMAL HIGH (ref 70–100)
BKR POTASSIUM: 3.6 mmol/L (ref 3.3–5.3)
BKR SODIUM: 140 mmol/L — ABNORMAL LOW (ref 136–144)

## 2020-05-07 LAB — PROTEIN, TOTAL W/CREATININE, URINE, RANDOM     (BH GH LMW YH)
BKR CREATININE, URINE, RANDOM: 101 mg/dL
BKR PROTEIN URINE RANDOM: 0.21 g/L (ref 0.00–1.00)
BKR PROTEIN/CREATININE RATIO, URINE, RANDOM: 0.21 mg/mg Cr — ABNORMAL HIGH (ref <0.10)
BKR WAM BASOPHIL ABSOLUTE COUNT.: 101 mg/dL (ref 0.00–1.00)

## 2020-05-07 LAB — PHOSPHORUS     (BH GH L LMW YH): BKR PHOSPHORUS: 4.8 mg/dL — ABNORMAL HIGH (ref 2.2–4.5)

## 2020-05-07 LAB — CBC WITH AUTO DIFFERENTIAL
BKR WAM ABSOLUTE IMMATURE GRANULOCYTES.: 0.03 x 1000/??L (ref 0.00–0.30)
BKR WAM ABSOLUTE LYMPHOCYTE COUNT.: 1.43 x 1000/??L (ref 0.60–3.70)
BKR WAM ABSOLUTE NRBC (2 DEC): 0 x 1000/??L (ref 0.00–1.00)
BKR WAM ANALYZER ANC: 2.58 x 1000/??L (ref 2.00–7.60)
BKR WAM BASOPHIL ABSOLUTE COUNT.: 0.03 x 1000/??L (ref 0.00–1.00)
BKR WAM BASOPHILS: 0.6 % (ref 0.0–1.4)
BKR WAM EOSINOPHIL ABSOLUTE COUNT.: 0.09 x 1000/??L (ref 0.00–1.00)
BKR WAM EOSINOPHILS: 1.9 % (ref 0.0–5.0)
BKR WAM HEMATOCRIT (2 DEC): 36.2 % — ABNORMAL LOW (ref 38.50–50.00)
BKR WAM HEMOGLOBIN: 11.7 g/dL — ABNORMAL LOW (ref 13.2–17.1)
BKR WAM IMMATURE GRANULOCYTES: 0.6 % (ref 0.0–1.0)
BKR WAM LYMPHOCYTES: 30.2 % (ref 17.0–50.0)
BKR WAM MCH (PG): 31.4 pg (ref 27.0–33.0)
BKR WAM MCHC: 32.3 g/dL (ref 31.0–36.0)
BKR WAM MCV: 97.1 fL (ref 80.0–100.0)
BKR WAM MONOCYTE ABSOLUTE COUNT.: 0.58 x 1000/??L (ref 0.00–1.00)
BKR WAM MONOCYTES: 12.2 % — ABNORMAL HIGH (ref 4.0–12.0)
BKR WAM MPV: 9.8 fL — ABNORMAL HIGH (ref 8.0–12.0)
BKR WAM NEUTROPHILS: 54.5 % (ref 39.0–72.0)
BKR WAM NUCLEATED RED BLOOD CELLS: 0 % (ref 0.0–1.0)
BKR WAM PLATELETS: 169 x1000/??L (ref 150–420)
BKR WAM RDW-CV: 15.7 % — ABNORMAL HIGH (ref 11.0–15.0)
BKR WAM RED BLOOD CELL COUNT.: 3.73 M/??L — ABNORMAL LOW (ref 4.00–6.00)
BKR WAM WHITE BLOOD CELL COUNT: 4.7 x1000/??L (ref 4.0–11.0)

## 2020-05-07 LAB — ALBUMIN/CREATININE PANEL, URINE, RANDOM
BKR ALBUMIN, URINE, RANDOM: 74.1 mg/L (ref 2.00–7.60)
BKR ALBUMIN/CREATININE RATIO, URINE, RANDOM: 73.4 mg/g{creat} — ABNORMAL HIGH (ref <30.0)
BKR CREATININE, URINE, RANDOM: 101 mg/dL (ref 0.60–3.70)

## 2020-05-07 LAB — TACROLIMUS LEVEL     (BH GH L LMW YH): BKR TACROLIMUS BLOOD: 6 ng/mL

## 2020-05-07 LAB — MAGNESIUM: BKR MAGNESIUM: 1.8 mg/dL (ref 1.7–2.4)

## 2020-05-08 ENCOUNTER — Ambulatory Visit
Admit: 2020-05-08 | Payer: PRIVATE HEALTH INSURANCE | Attending: Student in an Organized Health Care Education/Training Program | Primary: Family Medicine

## 2020-05-08 ENCOUNTER — Encounter: Admit: 2020-05-08 | Payer: PRIVATE HEALTH INSURANCE | Primary: Family Medicine

## 2020-05-08 ENCOUNTER — Encounter
Admit: 2020-05-08 | Payer: PRIVATE HEALTH INSURANCE | Attending: Student in an Organized Health Care Education/Training Program | Primary: Family Medicine

## 2020-05-08 DIAGNOSIS — N2581 Secondary hyperparathyroidism of renal origin: Secondary | ICD-10-CM

## 2020-05-08 DIAGNOSIS — T827XXA Infection and inflammatory reaction due to other cardiac and vascular devices, implants and grafts, initial encounter: Secondary | ICD-10-CM

## 2020-05-08 DIAGNOSIS — B259 Cytomegaloviral disease, unspecified: Secondary | ICD-10-CM

## 2020-05-08 DIAGNOSIS — J101 Influenza due to other identified influenza virus with other respiratory manifestations: Secondary | ICD-10-CM

## 2020-05-08 DIAGNOSIS — I770 Arteriovenous fistula, acquired: Secondary | ICD-10-CM

## 2020-05-08 DIAGNOSIS — E7800 Pure hypercholesterolemia, unspecified: Secondary | ICD-10-CM

## 2020-05-08 DIAGNOSIS — Z796 Long-term use of immunosuppressant medication: Secondary | ICD-10-CM

## 2020-05-08 DIAGNOSIS — Z940 Kidney transplant status: Secondary | ICD-10-CM

## 2020-05-08 DIAGNOSIS — I712 Thoracic aortic aneurysm (HC Code): Secondary | ICD-10-CM

## 2020-05-08 DIAGNOSIS — I10 Essential (primary) hypertension: Secondary | ICD-10-CM

## 2020-05-08 DIAGNOSIS — K7460 Unspecified cirrhosis of liver: Secondary | ICD-10-CM

## 2020-05-08 DIAGNOSIS — N186 End stage renal disease: Secondary | ICD-10-CM

## 2020-05-08 DIAGNOSIS — K219 Gastro-esophageal reflux disease without esophagitis: Secondary | ICD-10-CM

## 2020-05-08 DIAGNOSIS — G4733 Obstructive sleep apnea (adult) (pediatric): Secondary | ICD-10-CM

## 2020-05-08 DIAGNOSIS — D649 Anemia, unspecified: Secondary | ICD-10-CM

## 2020-05-08 DIAGNOSIS — E213 Hyperparathyroidism, unspecified: Secondary | ICD-10-CM

## 2020-05-08 NOTE — Progress Notes
Called Mr. Zingg to see if he can come for his kidney bx on Wednesday March 2nd at 0830. His wife answered the phone and said that they will come on March 2nd. I advised the wife and she can come with him but she will not be allowed in the infusion room so she will have to wait outside. The recovery period is about 3 hours and then he can go home. Pt's wife agreed with plan.

## 2020-05-09 DIAGNOSIS — Z79899 Other long term (current) drug therapy: Secondary | ICD-10-CM

## 2020-05-09 DIAGNOSIS — I10 Essential (primary) hypertension: Secondary | ICD-10-CM

## 2020-05-09 LAB — CYTOMEGALOVIRUS QUANTITATIVE PCR, PLASMA     (BH GH L LMW YH): BKR CMV QUANTITATIVE PCR: NOT DETECTED

## 2020-05-10 NOTE — Progress Notes
[employer] TRANSPLANT CENTERKIDNEY/PANCREAS TRANSPLANT PROGRAMMEDICAL FOLLOW UP VISIT CHIEF COMPLAINT/REASON FOR VISIT Adam Harper is seen in clinic to for a follow up visit regarding:- Evaluation of kidney allograft function - Assessment of immunosuppression medication efficacy and side effects- Assessment and management of blood pressureTRANSPLANT HISTORY ?	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	CMV D + / R + ?	Donor Toxo positive ?	CPRA zero, Alemtuzumab ?	9/4-9/7: admit for hyperK+, urgent HD. Bactrim switched to AtovaquoneINTERIM HISTORY  Adam Harper comes in for routine follow-up of his allograft function. Doing very well, no complaints. Still feels fatigued and tired since COVID infection; not fully improved. No LE swelling, CP, frank shortness of breath. REVIEW OF SYSTEMS  General: no generalized weakness, fatigue, fever or chillsCardio: no chest pain, shortness of breath bipedal edemaPulm: no cough, sputum production or wheezingGI: no nausea, vomiting, diarrhea or constipation GU: no dysuria or hematuriaSkin: no new rashes or skin lesionsNeuro: no focal weakness, tremors, speech disturbanceMEDICAL HISTORY Past Medical History: Diagnosis Date ? A-V fistula (HC Code) (HC CODE)   right arm ? Anemia  ? Anemia in ESRD (end-stage renal disease) (HC Code) 10/24/2014  Added automatically from request for surgery [id number]  Added automatically from request for surgery [id number] Overview:  Added automatically from request for surgery [id number] ? Anemia in ESRD (end-stage renal disease) (HC Code) (HC CODE) 10/24/2014  Formatting of this note might be different from the original. Overview:  Added automatically from request for surgery [id number] Overview:  Added automatically from request for surgery [id number] ? 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 Cirrhosis (HC Code) (HC CODE)  ? ESRD on hemodialysis (HC Code) (HC CODE)   Mon-Wed-Fri ? GERD (gastroesophageal reflux disease)  ? Hypercholesteremia  ? Hyperparathyroidism (HC Code) (HC CODE)  ? Hypertension  ? Infected prosthetic vascular graft, initial encounter (HC Code) (HC CODE) 10/24/2014 ? Influenza A  ? Morbid obesity (HC Code) (HC CODE) 02/03/2018 ? Obstructive sleep apnea   pt uses CPAP ? Secondary hyperparathyroidism of renal origin (HC Code) (HC CODE)  ? Thoracic aortic aneurysm (HC Code) (HC CODE)   Past Surgical History: Procedure Laterality Date ? AV FISTULA PLACEMENT Left   left lower arm unsuccessful, then moved to upper arm 2011, then had problems in 10/2013 - infection  No family history on file. Social History Tobacco Use ? Smoking status: Former Smoker   Packs/day: 0.25   Years: 25.00   Pack years: 6.25   Types: Cigarettes ? Smokeless tobacco: Never Used Substance Use Topics ? Alcohol use: No  Past medical, surgical, family and social history reviewed. No changes at this time.MEDICATIONS Outpatient Encounter Medications as of 05/08/2020 Medication Sig ? amLODIPine (NORVASC) 10 mg tablet Take 1 tablet (10 mg total) by mouth every morning. ? atorvastatin (LIPITOR) 40 mg tablet Take 1 tablet (40 mg total) by mouth daily. ? atovaquone (MEPRON) 750 mg/5 mL suspension Take 10 mLs (1,500 mg total) by mouth daily with dinner. ? carvediloL (COREG) 25 mg Immediate Release tablet Take 1 tablet (25 mg total) by mouth 2 (two) times daily with breakfast and dinner. ? chlorthalidone (HYGROTEN) 50 mg tablet Take 1 tablet (50 mg total) by mouth daily. ? ergocalciferol (VITAMIN D2) 1,250 mcg (50,000 unit) capsule Take 1 capsule (50,000 Units total) by mouth once a week. ? magnesium oxide (MAG-OX) 400 mg (241.3 mg magnesium) tablet Take 1 tablet (400 mg total) by mouth 2 (two) times daily. ? mycophenolate mofetil (CELLCEPT) 250 mg capsule Take 4 capsules (1,000 mg  total) by mouth 2 (two) times daily. ? omeprazole (PRILOSEC) 20 mg capsule Take 1 capsule (20 mg total) by mouth daily. ? predniSONE (DELTASONE) 5 mg tablet Take 1 tablet (5 mg total) by mouth daily. Take with food. ? tacrolimus XR (ENVARSUS XR) 4 mg 24 hr tablet Take 3 tablets (12 mg total) by mouth daily. No Known AllergiesPHYSICAL EXAM  BP (!) 154/68 (Site: r a, Cuff Size: Large)  - Pulse 88  - Temp 97.6 ?F (36.4 ?C)  - Ht 5' 9 (1.753 m)  - Wt 118.8 kg  - SpO2 97%  - BMI 38.69 kg/m? Wt Readings from Last 4 Encounters: 05/08/20 118.8 kg 02/29/20 115.7 kg 01/31/20 114.3 kg 01/17/20 115.1 kg   Gen: not in acute distressHEENT: atraumatic, pale palpebral conjunctivaNeck: no cervical lymphadenopathy, trachea midlineCV: regular rate and rhythm, distinct S1 and S2, no murmurs appreciated Resp: clear breath sounds bilaterally, no wheezing or crackles appreciatedAbd: non-distended, normoactive bowel sounds, non-tender to palpationExt: no pedal edemaNeuro: Awake, alert, no tremors, motor function grossly intactPERTINENT LABS Lab Results Component Value Date  WBC 4.7 05/07/2020  HGB 11.7 (L) 05/07/2020  HCT 36.20 (L) 05/07/2020  PLT 169 05/07/2020  GLU 105 (H) 05/07/2020  BUN 18 05/07/2020  CREATININE 2.18 (H) 05/07/2020  CO2 28 05/07/2020  CL 98 05/07/2020  NA 140 05/07/2020  K 3.6 05/07/2020  CALCIUM 9.0 05/07/2020  EGFR 8 06/21/2012  PRCRRAU 0.21 (H) 05/07/2020  PHOS 4.8 (H) 05/07/2020  PTH 37.5 12/06/2019  VITAMIND25HY 26 11/12/2009  HGBA1C 5.7 (H) 11/05/2019  IRON 80 01/30/2020  TIBC 275 01/30/2020  FERRITIN 1,163 (H) 01/30/2020 Lab Results Component Value Date  TACROLIMUS 6.0 05/07/2020 ASSESSMENT AND PLAN Adam Harper is seen in clinic today for a follow up visit regarding the patient's renal transplant.ESRD S/P KTRenal function remains stable; Cr 2.18 which is close to his best baselineLytes and acid/base stable without issuesProtocol biopsy planned for 2/3ImmunosuppressionEnv 12 mg and prednisone 5 mgTac level at goal of 6. on hold due to CMV viremia which is improvingProphylaxis and SurveillanceBK ND 2/10/22Remains on Mepron for PJP ppxCMV viremia:Intermediate risk, antibody positiveCurrently managed only with immunosuppression reductionWill f/u CMV level from 2/23 and repeat with next labsBlood Pressure managementOverall well-controlled; no changes to meds todayMBDPhos level up to 4.8; has been taking  high dose vitamin DWill check iPTH and total vit D levels with next labsF/U in 1 monthPriyanka Oksana Deberry, MBBSTransplant Nephrology Fellow

## 2020-05-14 ENCOUNTER — Encounter: Admit: 2020-05-14 | Payer: PRIVATE HEALTH INSURANCE | Primary: Family Medicine

## 2020-05-14 ENCOUNTER — Inpatient Hospital Stay: Admit: 2020-05-14 | Discharge: 2020-05-14 | Payer: PRIVATE HEALTH INSURANCE | Primary: Family Medicine

## 2020-05-14 DIAGNOSIS — Z940 Kidney transplant status: Secondary | ICD-10-CM

## 2020-05-14 DIAGNOSIS — T827XXA Infection and inflammatory reaction due to other cardiac and vascular devices, implants and grafts, initial encounter: Secondary | ICD-10-CM

## 2020-05-14 DIAGNOSIS — N186 End stage renal disease: Secondary | ICD-10-CM

## 2020-05-14 DIAGNOSIS — I10 Essential (primary) hypertension: Secondary | ICD-10-CM

## 2020-05-14 DIAGNOSIS — I770 Arteriovenous fistula, acquired: Secondary | ICD-10-CM

## 2020-05-14 DIAGNOSIS — N2581 Secondary hyperparathyroidism of renal origin: Secondary | ICD-10-CM

## 2020-05-14 DIAGNOSIS — E7800 Pure hypercholesterolemia, unspecified: Secondary | ICD-10-CM

## 2020-05-14 DIAGNOSIS — D649 Anemia, unspecified: Secondary | ICD-10-CM

## 2020-05-14 DIAGNOSIS — E213 Hyperparathyroidism, unspecified: Secondary | ICD-10-CM

## 2020-05-14 DIAGNOSIS — G4733 Obstructive sleep apnea (adult) (pediatric): Secondary | ICD-10-CM

## 2020-05-14 DIAGNOSIS — K219 Gastro-esophageal reflux disease without esophagitis: Secondary | ICD-10-CM

## 2020-05-14 DIAGNOSIS — J101 Influenza due to other identified influenza virus with other respiratory manifestations: Secondary | ICD-10-CM

## 2020-05-14 DIAGNOSIS — K7460 Unspecified cirrhosis of liver: Secondary | ICD-10-CM

## 2020-05-14 DIAGNOSIS — I712 Thoracic aortic aneurysm (HC Code): Secondary | ICD-10-CM

## 2020-05-14 NOTE — Patient Instructions
FOLLOWING YOUR KIDNEY BIOPSY TODAY-- Avoid strenuous exercise for the next 3 days.-- Avoid heavy lifting (no more than 10 lbs) for the next 3 days.-- You may take a bath or shower after 24 hrs.-- Please keep the flexible fabric bandage on for 2 days.  Remove thereafter.-- Continue your diet without any changes.-- Resume all previous medications (see note below).CONTACT OUR OFFICE AT [phone] IF:-- You feel weak or dizzy.-- You have a fever (temperature over 100.4)-- You have shaking chills-- You have blood in your urine.-- You have redness at the biopsy siteSEEK CARE IMMEDIATELY IF:-- You are only urinating small amounts or not at all.-- You have very bad pain in your abdomen or where your procedure was done.-- You suddenly have chest pain or trouble breathing (call 911)_____________________________		____________________________Patient Signature				Nurse Signature_____________________________		_____________________________Date						Patient?s preferred phone#

## 2020-05-14 NOTE — Procedures
KIDNEY BIOPSY PROCEDURE NOTE PROCEDURE: Kidney Transplant Biopsy OPERATOR: Stanford Scotland, MD ASSISTANT: Ultrasonographer from Dept. of Diagnostic Imaging ANESTHESIA: Local (Xylocaine) PRE-PROCEDURE DX: Protocol Biopsy POST-PROCEDURE DX: Normal appearing allograft by ultrasound PROCEDURE:      I have confirmed that Adam Harper is not taking any anticoagulants contraindicated for renal biopsy or if was, they have been appropriately held as per protocol. Blood pressure was within acceptable range.After obtaining proper informed consent discussing the risks, benefits, and alternatives to the procedure, and performing the time out procedure, Adam Harper was placed in a supine position. In this position, ultrasound revealed a transplanted kidney in the right lower quadrant.  There was no perinephric fluid collection seen.The cortical medullary differentiation was preserved, and there was no hydronephrosis.  The location of the major renal vessels was identified by Doppler evaluation.  The right lower quadrant was prepped and draped in the standard sterile fashion.Using sterile technique, the biopsy site was infiltrated with Xylocaine for local anesthesia.A small nick in the skin was made using an 11-blade.Then, using real-time ultrasound guidance and an 18-gauge spring loaded instrument, 3 passes where taken yielding 2 suitable cores from the kidney without complication.  Direct pressure was held for 10 minutes.  A sand-bag was applied for continued direct pressure.Adam Harper will remain in the procedure room for a period of 3-4 hours, during which time he will be monitored for evidence of bleeding complications.  Adam Harper, subsequently, will be discharged to home.Stanford Scotland, MD

## 2020-05-14 NOTE — Progress Notes
U/S Guided Percutaneous Kidney Biopsy performed by Dr.Virmani.  Invasive Procedure checklist ,Permission obtained and time out done prior to biopsy via help of [employer] in Spanish [phone].  Patient tolerated procedure well.  Patient with sandbag on for 2 hours and off for an hour.  No blood in urine noted.  Discharge instruction given in Spanish and verbalized understanding.

## 2020-05-15 LAB — DNA ARRAY (YMG)

## 2020-05-16 ENCOUNTER — Encounter: Admit: 2020-05-16 | Payer: PRIVATE HEALTH INSURANCE | Attending: Nephrology | Primary: Family Medicine

## 2020-05-16 MED FILL — AMLODIPINE 10 MG TABLET: 10 mg | ORAL | 90 days supply | Qty: 90 | Fill #2 | Status: CP

## 2020-05-16 MED FILL — PREDNISONE 5 MG TABLET: 5 mg | ORAL | 30 days supply | Qty: 30 | Fill #4 | Status: CP

## 2020-05-16 MED FILL — MYCOPHENOLATE MOFETIL 250 MG CAPSULE: 250 mg | ORAL | 30 days supply | Qty: 240 | Fill #5 | Status: CP

## 2020-05-19 ENCOUNTER — Inpatient Hospital Stay: Admit: 2020-05-19 | Discharge: 2020-05-19 | Payer: PRIVATE HEALTH INSURANCE | Primary: Family Medicine

## 2020-05-19 DIAGNOSIS — Z940 Kidney transplant status: Secondary | ICD-10-CM

## 2020-05-19 DIAGNOSIS — Z7952 Long term (current) use of systemic steroids: Secondary | ICD-10-CM

## 2020-05-19 DIAGNOSIS — Z4822 Encounter for aftercare following kidney transplant: Secondary | ICD-10-CM

## 2020-05-19 MED ORDER — METHYLPREDNISOLONE SODIUM SUCCINATE 500 MG INTRAVENOUS SOLUTION
500 mg | Status: CP
Start: 2020-05-19 — End: ?

## 2020-05-19 MED ORDER — METHYLPREDNISOLONE 500MG MBP
INTRAVENOUS | Status: DC
Start: 2020-05-19 — End: 2020-05-24
  Administered 2020-05-19: 17:00:00 104.000 mL/h via INTRAVENOUS

## 2020-05-19 NOTE — Progress Notes
Pt primarily Spanish speaking.  Video interpreter utilized.  Dose 1/3 of 500 mg IV Solumedrol in 100 ml NS given over 30 mins as ordered.  Pt tolerated infusion well.  VSS.  IID removed prior to discharge.  Education given to pt verbally with Spanish interpreter ID: [id number].  Written education on Methylprednisolone given to pt in [place] and [place].  Pt verbalized understanding.  Pt to return tomorrow (3/8) for his 2nd dose of Solumedrol infusion.  Pt aware with plan.

## 2020-05-19 NOTE — Patient Instructions
Methylprednisolone Solution for Injection?Qu? es este medicamento?La METILPREDNISOLONA es un corticosteroide. Se utiliza para tratar la inflamaci?n de la piel, articulaci?nes, pulmones y otros ?rganos. Condiciones comunes tratadas incluyen asma, alergias y artritis. Tambi?n se [place] para otras condiciones, tales como trastornos sangu?neos o enfermedades de la gl?ndula suprarrenal.Este medicamento puede ser utilizado para otros usos; si tiene alguna pregunta consulte con su proveedor de atenci?n m?dica o con su farmac?utico.MARCAS COMUNES: A-Methapred, Solu-Medrol?Qu? le debo informar a mi profesional de la salud antes de tomar este medicamento?Necesitan saber si usted presenta alguno de los siguientes problemas o situaciones:s?ndrome de Cushing enfermedad ocular, problemas de la visi?n diabetes glaucoma enfermedad cardiaca presi?n sangu?nea alta infecci?n (especialmente infecciones virales, como varicela, fuegos labiales o herpes) enfermedad hep?tica trastorno mental miastenia grave osteoporosis recientemente recibi? o tiene programado recibir una vacuna convulsiones problemas estomacales o intestinales enfermedad tiroidea una reacci?n al?rgica o inusual a la lactosa, a la metilprednisolona, a otros medicamentos, alimentos, colorantes o conservantes si est? embarazada o buscando quedar embarazada si est? amamantando a un beb??C?mo debo [employer]?Este medicamento se administra mediante inyecci?n o infusi?n por v?a intravenosa. Tambi?n se administra mediante inyecci?n por v?a intramuscular. Lo administra un profesional de Radiographer, therapeutic en un hospital o en un entorno cl?nico.Hable con su pediatra para informarse acerca del uso de [place] medicamento en ni?os. Aunque este medicamento se puede recetar para condiciones selectivas, las precauciones se aplican.Sobredosis: P?ngase en contacto inmediatamente con un centro toxicol?gico o una sala de urgencia si usted cree que haya tomado demasiado medicamento.ATENCI?N: [employer] es solo para usted. No comparta este medicamento con nadie.?Qu? sucede si me olvido de una dosis?No se aplica en este caso.?Qu? Horticulturist, commercial con este medicamento?No tome este medicamento con ninguno de los siguientes f?rmacos:alefacept equin?cea iopamidol vacunas de virus vivos metirapona mifepristonaEste medicamento tambi?n podr?a interactuar con las siguientes medicinas:anfotericina B aspirina y medicamentos tipo aspirina ciertos antibi?ticos, tales como eritromicina, Air cabin crew, troleandomicina ciertos medicamentos para la diabetes ciertos medicamentos para infecciones mic?ticas, tales como ketoconazol ciertos medicamentos para convulsiones, tales como carbamazepina, fenobarbital y fenito?na ciertos medicamentos que tratan o previenen co?gulos sangu?neos, como warfarina ciclosporina digoxina diur?ticos hormonas femeninas, como estr?genos o p?ldoras anticonceptivas isoniazida AINE, medicamentos para el dolor y la inflamaci?n, como ibuprofeno o naproxeno otros medicamentos para la miastenia grave rifampicina vacunasPuede ser que esta lista no menciona todas las posibles interacciones. Informe a su profesional de [address] de [employer] productos a base de hierbas, medicamentos de venta libre o suplementos nutritivos que est? tomando. Si usted fuma, consume bebidas alcoh?licas o si utiliza drogas ilegales, ind?queselo tambi?n a su profesional de [employer]. Algunas sustancias pueden interactuar con su medicamento.?A qu? debo estar atento al [employer]?Informe a su m?dico o a su profesional de la salud si sus s?ntomas no comienzan a mejorar o si empeoran. No deje de usarlo, excepto si as? lo indica su m?dico. Usted puede desarrollar una reacci?n grave. Su m?dico le dir? cu?nto Insurance account manager.Se supervisar? su estado de salud atentamente mientras reciba este medicamento.Este medicamento puede aumentar su riesgo de Primary school teacher una infecci?n. Informe a su m?dico o a su profesional de la salud si est? expuesto a cualquier persona con sarampi?n o varicela, o si desarrolla llagas o ampollas que no sanan correctamente.Este medicamento puede aumentar los niveles de Scientist, research (physical sciences). Preg?ntele a su profesional de la salud si es Passenger transport manager cambios en la dieta o en los medicamentos si usted tiene diabetes.Si observa alg?n cambio en la vista, inf?rmelo de inmediato a su m?dico  o su profesional de [employer].Usar [employer] por un periodo prolongado puede aumentar su riesgo de tener baja masa ?sea. Hable con su m?dico [employer].?Qu? efectos secundarios puedo tener al [employer] medicamento?Efectos secundarios que debe informar a su m?dico o a su profesional de la salud tan pronto como sea posible:reacciones al?rgicas, como erupci?n cut?nea, comez?n/picaz?n o urticaria, e hinchaz?n de la cara, los labios o la lengua heces con sangre o aspecto alquitranado alucinaciones, p?rdida del contacto con la realidad calambres musculares dolor muscular palpitaciones signos y s?ntomas de niveles altos de az?car en la sangre, tales como tener m?s sed o apetito, o tener que orinar con mayor frecuencia que lo habitual. Tambi?n puede sentirse muy cansado o tener visi?n borrosa. signos y s?ntomas de infecci?n, tales como fiebre o escalofr?os, tos, dolor de garganta, Engineer, mining o dificultad para orinar problemas al orinarEfectos secundarios que generalmente no requieren atenci?n m?dica (inf?rmelos a su m?dico o a su profesional de la salud si persisten o si son molestos):cambios en las emociones o el estado de ?nimo estre?imiento diarrea crecimiento excesivo de vello en la cara o el cuerpo dolor de cabeza n?useas, v?mito dolor, enrojecimiento o irritaci?n en el lugar de la inyecci?n dificultad para dormir aumento de pesoPuede ser que esta lista no menciona todos los posibles efectos secundarios. Comun?quese a su m?dico por asesoramiento m?dico [employer] secundarios. Usted puede informar los efectos secundarios a la FDA por tel?fono al 1-800-FDA-1088.?D?nde debo guardar mi medicina?Este medicamento se administra en hospitales o cl?nicas y no necesitar? guardarlo en su domicilio.ATENCI?N: Este folleto es un resumen. Puede ser que no cubra toda la posible informaci?n. Si usted tiene preguntas acerca de esta medicina, consulte con su m?dico, su farmac?utico o su profesional de Radiographer, therapeutic.? 2021 Elsevier/Gold Standard (2018-03-23 00:00:00)Methylprednisolone Solution for InjectionWhat is this medicine?METHYLPREDNISOLONE (meth ill pred NISS oh lone) is a corticosteroid. It is commonly used to treat inflammation of the skin, joints, lungs, and other organs. Common conditions treated include asthma, allergies, and arthritis. It is also used for other conditions, such as blood disorders and diseases of the adrenal glands.This medicine may be used for other purposes; ask your health care provider or pharmacist if you have questions.COMMON BRAND NAME(S): A-Methapred, Solu-MedrolWhat should I tell my health care provider before I take this medicine?They need to know if you have any of these conditions:?	Cushing's syndrome?	eye disease, vision problems?	diabetes?	glaucoma?	heart disease?	high blood pressure?	infection (especially a virus infection such as chickenpox, cold sores, or herpes)?	liver disease?	mental illness?	myasthenia gravis?	osteoporosis?	recently received or scheduled to receive a vaccine?	seizures?	stomach or intestine problems?	thyroid disease?	an unusual or allergic reaction to lactose, methylprednisolone, other medicines, foods, dyes, or preservatives?	pregnant or trying to get pregnant?	breast-feedingHow should I use this medicine?This medicine is for injection or infusion into a vein. It is also for injection into a muscle. It is given by a health care professional in a hospital or clinic setting.Talk to your pediatrician regarding the use of this medicine in children. While this drug may be prescribed for selected conditions, precautions do apply.Overdosage: If you think you have taken too much of this medicine contact a poison control center or emergency room at once.NOTE: This medicine is only for you. Do not share this medicine with others.What if I miss a dose?This does not apply.What may interact with this medicine?Do not take this medicine with any of the following medications:?	alefacept?	echinacea?	iopamidol?	live virus vaccines?	metyrapone?	mifepristoneThis medicine may also interact with the following medications:?	amphotericin B?	aspirin and aspirin-like medicines?	certain antibiotics like erythromycin, clarithromycin, troleandomycin?	certain medicines for diabetes?	certain medicines for fungal infection  like ketoconazole?	certain medicines for seizures like carbamazepine, phenobarbital, phenytoin?	certain medicines that treat or prevent blood clots like warfarin?	cyclosporine?	digoxin?	diuretics?	male hormones, like estrogens and birth control pills?	isoniazid?	NSAIDS, medicines for pain and inflammation, like ibuprofen or naproxen?	other medicines for myasthenia gravis?	rifampin?	vaccinesThis list may not describe all possible interactions. Give your health care provider a list of all the medicines, herbs, non-prescription drugs, or dietary supplements you use. Also tell them if you smoke, drink alcohol, or use illegal drugs. Some items may interact with your medicine.What should I watch for while using this medicine?Tell your doctor or healthcare professional if your symptoms do not start to get better or if they get worse. Do not stop taking except on your doctor's advice. You may develop a severe reaction. Your doctor will tell you how much medicine to take.Your condition will be monitored carefully while you are receiving this medicine.This medicine may increase your risk of getting an infection. Tell your doctor or health care professional if you are around anyone with measles or chickenpox, or if you develop sores or blisters that do not heal properly.This medicine may increase blood sugar. Ask your healthcare provider if changes in diet or medicines are needed if you have diabetes.Tell your doctor or health care professional right away if you have any change in your eyesight.Using this medicine for a long time may increase your risk of low bone mass. Talk to your doctor about bone health.What side effects may I notice from receiving this medicine?Side effects that you should report to your doctor or health care professional as soon as possible:?	allergic reactions like skin rash, itching or hives, swelling of the face, lips, or tongue?	bloody or tarry stools?	hallucination, loss of contact with reality?	muscle cramps?	muscle pain?	palpitations?	signs and symptoms of high blood sugar such as being more thirsty or hungry or having to urinate more than normal. You may also feel very tired or have blurry vision.?	signs and symptoms of infection like fever or chills; cough; sore throat; pain or trouble passing urine?	trouble passing urineSide effects that usually do not require medical attention (report to your doctor or health care professional if they continue or are bothersome):?	changes in emotions or mood?	constipation?	diarrhea?	excessive hair growth on the face or body?	headache?	nausea, vomiting?	pain, redness, or irritation at site where injected?	trouble sleeping?	weight gainThis list may not describe all possible side effects. Call your doctor for medical advice about side effects. You may report side effects to FDA at 1-800-FDA-1088.Where should I keep my medicine?This drug is given in a hospital or clinic and will not be stored at home.NOTE: This sheet is a summary. It may not cover all possible information. If you have questions about this medicine, talk to your doctor, pharmacist, or health care provider.? 2021 Elsevier/Gold Standard (2017-12-01 09:12:19)

## 2020-05-20 ENCOUNTER — Encounter: Admit: 2020-05-20 | Payer: PRIVATE HEALTH INSURANCE | Primary: Family Medicine

## 2020-05-20 ENCOUNTER — Inpatient Hospital Stay: Admit: 2020-05-20 | Discharge: 2020-05-20 | Payer: PRIVATE HEALTH INSURANCE | Primary: Family Medicine

## 2020-05-20 DIAGNOSIS — E213 Hyperparathyroidism, unspecified: Secondary | ICD-10-CM

## 2020-05-20 DIAGNOSIS — I770 Arteriovenous fistula, acquired: Secondary | ICD-10-CM

## 2020-05-20 DIAGNOSIS — K219 Gastro-esophageal reflux disease without esophagitis: Secondary | ICD-10-CM

## 2020-05-20 DIAGNOSIS — Z796 Long-term use of immunosuppressant medication: Secondary | ICD-10-CM

## 2020-05-20 DIAGNOSIS — I712 Thoracic aortic aneurysm (HC Code): Secondary | ICD-10-CM

## 2020-05-20 DIAGNOSIS — K7460 Unspecified cirrhosis of liver: Secondary | ICD-10-CM

## 2020-05-20 DIAGNOSIS — I10 Essential (primary) hypertension: Secondary | ICD-10-CM

## 2020-05-20 DIAGNOSIS — J101 Influenza due to other identified influenza virus with other respiratory manifestations: Secondary | ICD-10-CM

## 2020-05-20 DIAGNOSIS — Z7952 Long term (current) use of systemic steroids: Secondary | ICD-10-CM

## 2020-05-20 DIAGNOSIS — E7800 Pure hypercholesterolemia, unspecified: Secondary | ICD-10-CM

## 2020-05-20 DIAGNOSIS — Z940 Kidney transplant status: Secondary | ICD-10-CM

## 2020-05-20 DIAGNOSIS — T827XXA Infection and inflammatory reaction due to other cardiac and vascular devices, implants and grafts, initial encounter: Secondary | ICD-10-CM

## 2020-05-20 DIAGNOSIS — Z4822 Encounter for aftercare following kidney transplant: Secondary | ICD-10-CM

## 2020-05-20 DIAGNOSIS — G4733 Obstructive sleep apnea (adult) (pediatric): Secondary | ICD-10-CM

## 2020-05-20 DIAGNOSIS — D649 Anemia, unspecified: Secondary | ICD-10-CM

## 2020-05-20 DIAGNOSIS — N2581 Secondary hyperparathyroidism of renal origin: Secondary | ICD-10-CM

## 2020-05-20 DIAGNOSIS — N186 End stage renal disease: Secondary | ICD-10-CM

## 2020-05-20 MED ORDER — METHYLPREDNISOLONE 500MG MBP
INTRAVENOUS | Status: DC
Start: 2020-05-20 — End: 2020-05-25
  Administered 2020-05-20 (×2): 104.000 mL/h via INTRAVENOUS

## 2020-05-20 MED ORDER — METHYLPREDNISOLONE SODIUM SUCCINATE 500 MG INTRAVENOUS SOLUTION
500 mg | Status: CP
Start: 2020-05-20 — End: ?

## 2020-05-20 NOTE — Progress Notes
Called Raahil to inform him that he needs to do labs. Tac [employer] goal is 7-8. Patient is scheduled for his third dose of solumedrol tomorrow. I spoke with the wife and asked her that he should do labs tomorrow when he comes for his infusion. MD unavailable, verbal obtained from Dr. Chapman Moss orders placed.

## 2020-05-20 NOTE — Progress Notes
2/3 Solumedrol 500 mg IV administered as ordered.  Pt tolerated infusion well.  VSS pre and post infusion. ID removed prior to discharge.  Pt to come back tomorrow for his 3rd dose.  Pt aware.

## 2020-05-21 ENCOUNTER — Encounter: Admit: 2020-05-21 | Payer: PRIVATE HEALTH INSURANCE | Primary: Family Medicine

## 2020-05-21 ENCOUNTER — Encounter
Admit: 2020-05-21 | Payer: PRIVATE HEALTH INSURANCE | Attending: Pharmacist Clinician (PhC)/ Clinical Pharmacy Specialist | Primary: Family Medicine

## 2020-05-21 ENCOUNTER — Telehealth: Admit: 2020-05-21 | Payer: PRIVATE HEALTH INSURANCE | Primary: Family Medicine

## 2020-05-21 ENCOUNTER — Inpatient Hospital Stay: Admit: 2020-05-21 | Discharge: 2020-05-21 | Payer: PRIVATE HEALTH INSURANCE | Primary: Family Medicine

## 2020-05-21 DIAGNOSIS — D649 Anemia, unspecified: Secondary | ICD-10-CM

## 2020-05-21 DIAGNOSIS — N2581 Secondary hyperparathyroidism of renal origin: Secondary | ICD-10-CM

## 2020-05-21 DIAGNOSIS — K219 Gastro-esophageal reflux disease without esophagitis: Secondary | ICD-10-CM

## 2020-05-21 DIAGNOSIS — J101 Influenza due to other identified influenza virus with other respiratory manifestations: Secondary | ICD-10-CM

## 2020-05-21 DIAGNOSIS — Z4822 Encounter for aftercare following kidney transplant: Secondary | ICD-10-CM

## 2020-05-21 DIAGNOSIS — Z79899 Other long term (current) drug therapy: Secondary | ICD-10-CM

## 2020-05-21 DIAGNOSIS — E213 Hyperparathyroidism, unspecified: Secondary | ICD-10-CM

## 2020-05-21 DIAGNOSIS — N186 End stage renal disease: Secondary | ICD-10-CM

## 2020-05-21 DIAGNOSIS — I770 Arteriovenous fistula, acquired: Secondary | ICD-10-CM

## 2020-05-21 DIAGNOSIS — K7460 Unspecified cirrhosis of liver: Secondary | ICD-10-CM

## 2020-05-21 DIAGNOSIS — T827XXA Infection and inflammatory reaction due to other cardiac and vascular devices, implants and grafts, initial encounter: Secondary | ICD-10-CM

## 2020-05-21 DIAGNOSIS — Z940 Kidney transplant status: Secondary | ICD-10-CM

## 2020-05-21 DIAGNOSIS — Z796 Long-term use of immunosuppressant medication: Secondary | ICD-10-CM

## 2020-05-21 DIAGNOSIS — E7800 Pure hypercholesterolemia, unspecified: Secondary | ICD-10-CM

## 2020-05-21 DIAGNOSIS — I10 Essential (primary) hypertension: Secondary | ICD-10-CM

## 2020-05-21 DIAGNOSIS — I712 Thoracic aortic aneurysm (HC Code): Secondary | ICD-10-CM

## 2020-05-21 DIAGNOSIS — G4733 Obstructive sleep apnea (adult) (pediatric): Secondary | ICD-10-CM

## 2020-05-21 LAB — CBC WITH AUTO DIFFERENTIAL
BKR WAM ABSOLUTE IMMATURE GRANULOCYTES.: 0.14 x 1000/??L (ref 0.00–0.30)
BKR WAM ABSOLUTE LYMPHOCYTE COUNT.: 0.6 x 1000/??L (ref 0.60–3.70)
BKR WAM ABSOLUTE NRBC (2 DEC): 0 x 1000/??L (ref 0.00–1.00)
BKR WAM ANALYZER ANC: 10.87 x 1000/??L — ABNORMAL HIGH (ref 2.00–7.60)
BKR WAM BASOPHIL ABSOLUTE COUNT.: 0.01 x 1000/??L (ref 0.00–1.00)
BKR WAM BASOPHILS: 0.1 % (ref 0.0–1.4)
BKR WAM EOSINOPHIL ABSOLUTE COUNT.: 0 x 1000/??L (ref 0.00–1.00)
BKR WAM EOSINOPHILS: 0 % (ref 0.0–5.0)
BKR WAM HEMATOCRIT (2 DEC): 38.9 % (ref 38.50–50.00)
BKR WAM IMMATURE GRANULOCYTES: 1.2 % — ABNORMAL HIGH (ref 0.0–1.0)
BKR WAM LYMPHOCYTES: 5 % — ABNORMAL LOW (ref 17.0–50.0)
BKR WAM MCH (PG): 30.9 pg (ref 27.0–33.0)
BKR WAM MCHC: 32.1 g/dL (ref 31.0–36.0)
BKR WAM MCV: 96.3 fL (ref 80.0–100.0)
BKR WAM MONOCYTE ABSOLUTE COUNT.: 0.38 x 1000/??L (ref 0.00–1.00)
BKR WAM MONOCYTES: 3.2 % — ABNORMAL LOW (ref 4.0–12.0)
BKR WAM MPV: 10.5 fL (ref 8.0–12.0)
BKR WAM NEUTROPHILS: 90.5 % — ABNORMAL HIGH (ref 39.0–72.0)
BKR WAM NUCLEATED RED BLOOD CELLS: 0 % (ref 0.0–1.0)
BKR WAM PLATELETS: 224 x1000/??L (ref 150–420)
BKR WAM RDW-CV: 16 % — ABNORMAL HIGH (ref 11.0–15.0)
BKR WAM RED BLOOD CELL COUNT.: 4.04 M/??L (ref 4.00–6.00)
BKR WAM WHITE BLOOD CELL COUNT: 12 x1000/??L — ABNORMAL HIGH (ref 4.0–11.0)

## 2020-05-21 LAB — BASIC METABOLIC PANEL
BKR ANION GAP: 16 (ref 7–17)
BKR BLOOD UREA NITROGEN: 31 mg/dL — ABNORMAL HIGH (ref 6–20)
BKR BUN / CREAT RATIO: 15.3 (ref 8.0–23.0)
BKR CALCIUM: 9.5 mg/dL (ref 8.8–10.2)
BKR CHLORIDE: 98 mmol/L (ref 98–107)
BKR CO2: 27 mmol/L (ref 20–30)
BKR CREATININE: 2.03 mg/dL — ABNORMAL HIGH (ref 0.40–1.30)
BKR EGFR (AFR AMER): 41 mL/min/{1.73_m2} (ref >60)
BKR EGFR (NON AFRICAN AMERICAN): 34 mL/min/{1.73_m2} (ref >60)
BKR GLUCOSE: 100 mg/dL (ref 70–100)
BKR SODIUM: 141 mmol/L (ref 136–144)
BKR WAM HEMOGLOBIN: 16 g/dL — ABNORMAL LOW (ref 7–17)

## 2020-05-21 LAB — MAGNESIUM: BKR MAGNESIUM: 2 mg/dL (ref 1.7–2.4)

## 2020-05-21 LAB — PHOSPHORUS     (BH GH L LMW YH): BKR PHOSPHORUS: 4.5 mg/dL (ref 2.2–4.5)

## 2020-05-21 MED ORDER — PREDNISONE 5 MG TABLET
5 mg | ORAL_TABLET | Freq: Every day | ORAL | 12 refills | 90.00 days | Status: AC
Start: 2020-05-21 — End: 2021-06-18

## 2020-05-21 MED ORDER — PREDNISONE 5 MG TABLET
5 mg | ORAL_TABLET | ORAL | 1 refills | 34.00 days | Status: AC
Start: 2020-05-21 — End: ?
  Filled 2020-05-21: qty 169, 34d supply, fill #0
  Filled 2020-05-21: qty 169, 34d supply, fill #1

## 2020-05-21 MED ORDER — METHYLPREDNISOLONE 500MG MBP
INTRAVENOUS | Status: DC
Start: 2020-05-21 — End: 2020-05-26
  Administered 2020-05-21: 17:00:00 104.000 mL/h via INTRAVENOUS

## 2020-05-21 MED ORDER — METHYLPREDNISOLONE SODIUM SUCCINATE 500 MG INTRAVENOUS SOLUTION
500 mg | Status: CP
Start: 2020-05-21 — End: ?

## 2020-05-21 MED FILL — CARVEDILOL IMMEDIATE RELEASE 25 MG TABLET: 25 mg | ORAL | 30 days supply | Qty: 60 | Fill #6 | Status: CP

## 2020-05-21 NOTE — Telephone Encounter
LogEventMarland Kitchen [name] #: [id number] Sent: 05/20/2020 16:05 MTCaller Name: Vence Lalor Tel: [phone] Ext: From: post kidney Message Type: General MessageMessage: Pt: Name: Ephriam Turman: Nov 23, 1962MD: AschYou are seen at [facility] in [place], correct? yes Pre or Post: post Organ Type: kidneyRE: returning a call from transplant a little bit ago, requesting call back Name of Pharmacy (if applicable): N/AFor Organization: [place] - Patient CallName: Role: [facility]LogEvent: [phone] Interpreter ID #[id number] Returned call to pt with spanish interpreter. Pt did not have any questions as he notes his wife spoke with [place] RN yesterday. Advised per infusion room to proceed directly to infusion room as they will do labs as confirmed by Angelique Blonder. Pt agreeable.

## 2020-05-21 NOTE — Progress Notes
3/3 Solumedrol 500 mg IV administered as ordered.  Pt tolerated infusion well.  VSS pre and post infusion. ID removed prior to discharge.  Labs drawn except for Tac level patient claimed he took his meds this morning including the tac. Prednisone taper dosing and schedule instructions given with verbalized understanding.

## 2020-05-22 ENCOUNTER — Encounter: Admit: 2020-05-22 | Payer: PRIVATE HEALTH INSURANCE | Attending: Transplant Surgery | Primary: Family Medicine

## 2020-05-22 ENCOUNTER — Encounter: Admit: 2020-05-22 | Payer: PRIVATE HEALTH INSURANCE | Primary: Family Medicine

## 2020-05-22 ENCOUNTER — Encounter: Admit: 2020-05-22 | Payer: PRIVATE HEALTH INSURANCE | Attending: Clinical Genetics (M.D.) | Primary: Family Medicine

## 2020-05-22 DIAGNOSIS — Z796 Long-term use of immunosuppressant medication: Secondary | ICD-10-CM

## 2020-05-22 DIAGNOSIS — Z940 Kidney transplant status: Secondary | ICD-10-CM

## 2020-05-22 LAB — PTH, INTACT WITHOUT CALCIUM: BKR PARATHYROID HORMONE INTACT: 35.2 pg/mL (ref 15.0–65.0)

## 2020-05-22 NOTE — Progress Notes
LogEvent: [name] #: [id number] Sent: 05/22/2020 06:15 MTCaller Name: Emelia Salisbury: [phone] MWU:XLKG: Lurline Idol Type: General MessageMessage: Pt: Name: Adam Harper.DOB: 13-Dec-1960.You are seen at [facility] transplant in [place], correct? Yes.Post KidneyRE: Lab called. Potassium hemolized, test cancelled. The rest of the BMP panel was completed successfully.

## 2020-05-23 LAB — CYTOMEGALOVIRUS QUANTITATIVE PCR, PLASMA     (BH GH L LMW YH): BKR CMV QUANTITATIVE PCR: NOT DETECTED

## 2020-05-25 LAB — VITAMIN D, 25-HYDROXY
BKR VITAMIN D, 25-HYDROXY TOTAL (YH): 39 ng/mL
BKR VITAMIN D2, 25-HYDROXY: 32 ng/mL
BKR VITAMIN D3, 25-HYDROXY: 7 ng/mL

## 2020-05-28 ENCOUNTER — Telehealth: Admit: 2020-05-28 | Payer: PRIVATE HEALTH INSURANCE | Primary: Family Medicine

## 2020-05-28 NOTE — Telephone Encounter
LogEventMarland Kitchen [name] #: [id number] Sent: 05/28/2020 08:50 MTCaller Name: Rapheal Masso: [phone] Ext: From: Post kidney patientMessage Type: General MessageMessage: Pt: Name: Alijah Akram: 29-May-1962MD:You are seen at [facility] transplant in [place], correct? Yes Post KidneyRE: **Using interpreter [phone]** Patient calling stating his tongue feels burnt like he has eaten a lot of spicy food and has been for the last 2 months and is unsure if this is a side effect of his medications. Patient is also needing clearance for dental cleaning and extraction. Name of Pharmacy (if applicable): N/AFor Organization: [facility] - Patient CallName: Role: [facility]LogEvent: [phone] Interpreter ID#: [id number] Reviewed situation with Dr. Chapman Moss, plan to examine and swab pt's mouth at visit on 3/24. Will provide dental clearance with augmentin as dental prophylaxis, however no dental information provided with call. Called to speak with pt, unable to reach. Left detailed VM in Spanish regarding plan to examine pt in person and also regarding dental clearance/prophylaxis. Callback info provided.

## 2020-05-29 ENCOUNTER — Encounter: Admit: 2020-05-29 | Payer: PRIVATE HEALTH INSURANCE | Primary: Family Medicine

## 2020-06-03 MED FILL — MAGNESIUM OXIDE 400 MG (241.3 MG MAGNESIUM) TABLET: 400 mg (241.3 mg magnesium) | ORAL | 30 days supply | Qty: 60 | Fill #1 | Status: CP

## 2020-06-03 MED FILL — MAGNESIUM OXIDE 400 MG (241.3 MG MAGNESIUM) TABLET: 400 mg (241.3 mg magnesium) | ORAL | 30 days supply | Qty: 60 | Fill #4 | Status: CP

## 2020-06-03 MED FILL — TACROLIMUS XR 4 MG TABLET,EXTENDED RELEASE 24 HR: 4 mg | ORAL | 30 days supply | Qty: 90 | Fill #5 | Status: CP

## 2020-06-03 MED FILL — TACROLIMUS XR 4 MG TABLET,EXTENDED RELEASE 24 HR: 4 mg | ORAL | 30 days supply | Qty: 90 | Fill #6 | Status: CP

## 2020-06-04 ENCOUNTER — Inpatient Hospital Stay: Admit: 2020-06-04 | Discharge: 2020-06-04 | Payer: PRIVATE HEALTH INSURANCE | Primary: Family Medicine

## 2020-06-04 DIAGNOSIS — Z79899 Other long term (current) drug therapy: Secondary | ICD-10-CM

## 2020-06-04 DIAGNOSIS — Z940 Kidney transplant status: Secondary | ICD-10-CM

## 2020-06-04 DIAGNOSIS — Z796 Long-term use of immunosuppressant medication: Secondary | ICD-10-CM

## 2020-06-04 LAB — BASIC METABOLIC PANEL
BKR ANION GAP: 14 (ref 7–17)
BKR BLOOD UREA NITROGEN: 22 mg/dL — ABNORMAL HIGH (ref 6–20)
BKR BUN / CREAT RATIO: 11.5 x1000/??L (ref 8.0–23.0)
BKR CALCIUM: 8.8 mg/dL (ref 8.8–10.2)
BKR CHLORIDE: 97 mmol/L — ABNORMAL LOW (ref 98–107)
BKR CO2: 31 mmol/L — ABNORMAL HIGH (ref 20–30)
BKR CREATININE: 1.91 mg/dL — ABNORMAL HIGH (ref 0.40–1.30)
BKR EGFR (AFR AMER): 44 mL/min/{1.73_m2} (ref >60)
BKR EGFR (NON AFRICAN AMERICAN): 36 mL/min/{1.73_m2} — ABNORMAL HIGH (ref >60)
BKR GLUCOSE: 114 mg/dL — ABNORMAL HIGH (ref 70–100)
BKR POTASSIUM: 3.5 mmol/L (ref 3.3–5.3)
BKR SODIUM: 142 mmol/L (ref 136–144)
BKR WAM MCHC: 1.91 mg/dL — ABNORMAL HIGH (ref 0.40–1.30)
BKR WAM MCV: 114 mg/dL — ABNORMAL HIGH (ref 70–100)

## 2020-06-04 LAB — CBC WITH AUTO DIFFERENTIAL
BKR WAM ABSOLUTE IMMATURE GRANULOCYTES.: 0.07 x 1000/??L (ref 0.00–0.30)
BKR WAM ABSOLUTE LYMPHOCYTE COUNT.: 0.84 x 1000/??L (ref 0.60–3.70)
BKR WAM ABSOLUTE NRBC (2 DEC): 0 x 1000/??L (ref 0.00–1.00)
BKR WAM ANALYZER ANC: 5.47 x 1000/??L (ref 2.00–7.60)
BKR WAM BASOPHIL ABSOLUTE COUNT.: 0.02 x 1000/??L (ref 0.00–1.00)
BKR WAM BASOPHILS: 0.3 % (ref 0.0–1.4)
BKR WAM EOSINOPHIL ABSOLUTE COUNT.: 0.03 x 1000/??L (ref 0.00–1.00)
BKR WAM EOSINOPHILS: 0.4 % (ref 0.0–5.0)
BKR WAM HEMATOCRIT (2 DEC): 41.1 % (ref 38.50–50.00)
BKR WAM HEMOGLOBIN: 13.6 g/dL — ABNORMAL HIGH (ref 13.2–17.1)
BKR WAM IMMATURE GRANULOCYTES: 1 % (ref 0.0–1.0)
BKR WAM MCH (PG): 31.9 pg — ABNORMAL HIGH (ref 27.0–33.0)
BKR WAM MONOCYTE ABSOLUTE COUNT.: 0.49 x 1000/??L (ref 0.00–1.00)
BKR WAM MONOCYTES: 7.1 % (ref 4.0–12.0)
BKR WAM MPV: 9.6 fL (ref 8.0–12.0)
BKR WAM NEUTROPHILS: 79.1 % — ABNORMAL HIGH (ref 39.0–72.0)
BKR WAM NUCLEATED RED BLOOD CELLS: 0 % (ref 0.0–1.0)
BKR WAM PLATELETS: 214 x1000/??L (ref 150–420)
BKR WAM RDW-CV: 15.5 % — ABNORMAL HIGH (ref 11.0–15.0)
BKR WAM RED BLOOD CELL COUNT.: 4.26 M/??L — ABNORMAL LOW (ref 4.00–6.00)
BKR WAM WHITE BLOOD CELL COUNT: 6.9 x1000/??L (ref 4.0–11.0)

## 2020-06-04 LAB — ALBUMIN/CREATININE PANEL, URINE, RANDOM
BKR ALBUMIN, URINE, RANDOM: 67.1 mg/L
BKR ALBUMIN/CREATININE RATIO, URINE, RANDOM: 99.3 mg/g{creat} — ABNORMAL HIGH (ref <30.0)
BKR CREATININE, URINE, RANDOM: 68 mg/dL

## 2020-06-04 LAB — PROTEIN, TOTAL W/CREATININE, URINE, RANDOM     (BH GH LMW YH)
BKR CREATININE, URINE, RANDOM: 68 mg/dL
BKR PROTEIN URINE RANDOM: 0.16 g/L
BKR PROTEIN/CREATININE RATIO, URINE, RANDOM: 0.23 mg/mg Cr — ABNORMAL HIGH (ref <0.10)

## 2020-06-04 LAB — TACROLIMUS LEVEL     (BH GH L LMW YH): BKR TACROLIMUS BLOOD: 5.3 ng/mL

## 2020-06-04 LAB — PHOSPHORUS     (BH GH L LMW YH): BKR PHOSPHORUS: 4.6 mg/dL — ABNORMAL HIGH (ref 2.2–4.5)

## 2020-06-04 LAB — MAGNESIUM: BKR MAGNESIUM: 1.6 mg/dL — ABNORMAL LOW (ref 1.7–2.4)

## 2020-06-05 ENCOUNTER — Inpatient Hospital Stay: Admit: 2020-06-05 | Discharge: 2020-06-05 | Payer: PRIVATE HEALTH INSURANCE | Primary: Family Medicine

## 2020-06-05 ENCOUNTER — Ambulatory Visit: Admit: 2020-06-05 | Payer: PRIVATE HEALTH INSURANCE | Attending: Nephrology | Primary: Family Medicine

## 2020-06-05 ENCOUNTER — Encounter: Admit: 2020-06-05 | Payer: PRIVATE HEALTH INSURANCE | Attending: Nephrology | Primary: Family Medicine

## 2020-06-05 DIAGNOSIS — J101 Influenza due to other identified influenza virus with other respiratory manifestations: Secondary | ICD-10-CM

## 2020-06-05 DIAGNOSIS — I712 Thoracic aortic aneurysm (HC Code): Secondary | ICD-10-CM

## 2020-06-05 DIAGNOSIS — R5383 Other fatigue: Secondary | ICD-10-CM

## 2020-06-05 DIAGNOSIS — I10 Essential (primary) hypertension: Secondary | ICD-10-CM

## 2020-06-05 DIAGNOSIS — G4733 Obstructive sleep apnea (adult) (pediatric): Secondary | ICD-10-CM

## 2020-06-05 DIAGNOSIS — K7460 Unspecified cirrhosis of liver: Secondary | ICD-10-CM

## 2020-06-05 DIAGNOSIS — Z796 Long-term use of immunosuppressant medication: Secondary | ICD-10-CM

## 2020-06-05 DIAGNOSIS — Z79899 Other long term (current) drug therapy: Secondary | ICD-10-CM

## 2020-06-05 DIAGNOSIS — E213 Hyperparathyroidism, unspecified: Secondary | ICD-10-CM

## 2020-06-05 DIAGNOSIS — T827XXA Infection and inflammatory reaction due to other cardiac and vascular devices, implants and grafts, initial encounter: Secondary | ICD-10-CM

## 2020-06-05 DIAGNOSIS — K219 Gastro-esophageal reflux disease without esophagitis: Secondary | ICD-10-CM

## 2020-06-05 DIAGNOSIS — N186 End stage renal disease: Secondary | ICD-10-CM

## 2020-06-05 DIAGNOSIS — I770 Arteriovenous fistula, acquired: Secondary | ICD-10-CM

## 2020-06-05 DIAGNOSIS — N2581 Secondary hyperparathyroidism of renal origin: Secondary | ICD-10-CM

## 2020-06-05 DIAGNOSIS — Z940 Kidney transplant status: Secondary | ICD-10-CM

## 2020-06-05 DIAGNOSIS — D649 Anemia, unspecified: Secondary | ICD-10-CM

## 2020-06-05 DIAGNOSIS — E7800 Pure hypercholesterolemia, unspecified: Secondary | ICD-10-CM

## 2020-06-05 LAB — CBC WITH AUTO DIFFERENTIAL
BKR ANION GAP: 7.6 % — ABNORMAL LOW (ref 17.0–50.0)
BKR CHLORIDE: 8.1 x1000/??L — ABNORMAL LOW (ref 4.0–11.0)
BKR WAM ABSOLUTE IMMATURE GRANULOCYTES.: 0.06 x 1000/ÂµL (ref 0.00–0.30)
BKR WAM ABSOLUTE LYMPHOCYTE COUNT.: 0.61 x 1000/??L (ref 0.60–3.70)
BKR WAM ABSOLUTE NRBC (2 DEC): 0 x 1000/??L (ref 0.00–1.00)
BKR WAM ANALYZER ANC: 6.98 x 1000/??L (ref 2.00–7.60)
BKR WAM BASOPHIL ABSOLUTE COUNT.: 0.02 x 1000/??L (ref 0.00–1.00)
BKR WAM BASOPHILS: 0.2 % — ABNORMAL HIGH (ref 0.0–1.4)
BKR WAM EOSINOPHIL ABSOLUTE COUNT.: 0.03 x 1000/??L (ref 0.00–1.00)
BKR WAM EOSINOPHILS: 0.4 % (ref 0.0–5.0)
BKR WAM HEMATOCRIT (2 DEC): 202 x1000/??L — ABNORMAL LOW (ref 150–420)
BKR WAM HEMATOCRIT (2 DEC): 41.8 % (ref 38.50–50.00)
BKR WAM HEMOGLOBIN: 13.8 g/dL (ref 13.2–17.1)
BKR WAM IMMATURE GRANULOCYTES: 0.7 % (ref 0.0–1.0)
BKR WAM LYMPHOCYTES: 7.6 % — ABNORMAL LOW (ref 17.0–50.0)
BKR WAM MCH (PG): 32.2 pg (ref 27.0–33.0)
BKR WAM MCHC: 33 g/dL (ref 31.0–36.0)
BKR WAM MCV: 97.7 fL (ref 80.0–100.0)
BKR WAM MONOCYTE ABSOLUTE COUNT.: 0.35 x 1000/??L (ref 0.00–1.00)
BKR WAM MONOCYTES: 4.3 % (ref 4.0–12.0)
BKR WAM MPV: 9.6 fL (ref 8.0–12.0)
BKR WAM NEUTROPHILS: 86.8 % — ABNORMAL HIGH (ref 39.0–72.0)
BKR WAM NUCLEATED RED BLOOD CELLS: 0 % (ref 0.0–1.0)
BKR WAM PLATELETS: 202 x1000/??L (ref 150–420)
BKR WAM RDW-CV: 15.7 % — ABNORMAL HIGH (ref 11.0–15.0)
BKR WAM RED BLOOD CELL COUNT.: 4.28 M/??L (ref 4.00–6.00)
BKR WAM WHITE BLOOD CELL COUNT: 8.1 x1000/??L (ref 4.0–11.0)

## 2020-06-05 LAB — BASIC METABOLIC PANEL
BKR ANION GAP: 12 (ref 7–17)
BKR BLOOD UREA NITROGEN: 19 mg/dL — ABNORMAL HIGH (ref 6–20)
BKR BUN / CREAT RATIO: 10.4 mmol/L — ABNORMAL LOW (ref 8.0–23.0)
BKR CALCIUM: 8.9 mg/dL (ref 8.8–10.2)
BKR CO2: 29 mmol/L (ref 20–30)
BKR CREATININE: 1.83 mg/dL — ABNORMAL HIGH (ref 0.40–1.30)
BKR EGFR (AFR AMER): 46 mL/min/{1.73_m2} (ref >60)
BKR EGFR (NON AFRICAN AMERICAN): 38 mL/min/{1.73_m2} (ref >60)
BKR GLUCOSE: 149 mg/dL — ABNORMAL HIGH (ref 70–100)
BKR POTASSIUM: 4 mmol/L (ref 3.3–5.3)
BKR SODIUM: 138 mmol/L (ref 136–144)

## 2020-06-05 LAB — CYTOMEGALOVIRUS QUANTITATIVE PCR, PLASMA     (BH GH L LMW YH): BKR CMV QUANTITATIVE PCR: NOT DETECTED mg/dL

## 2020-06-05 LAB — CK     (BH GH L LMW YH): BKR CREATINE KINASE TOTAL: 177 U/L (ref 11–204)

## 2020-06-05 LAB — PHOSPHORUS     (BH GH L LMW YH)
BKR PHOSPHORUS: 3.4 mg/dL (ref 2.2–4.5)
BKR WAM LYMPHOCYTES: 3.4 mg/dL — ABNORMAL LOW (ref 2.2–4.5)

## 2020-06-05 LAB — LACTATE DEHYDROGENASE: BKR LACTATE DEHYDROGENASE: 383 U/L — ABNORMAL HIGH (ref 122–241)

## 2020-06-05 LAB — MAGNESIUM: BKR MAGNESIUM: 1.8 mg/dL (ref 1.7–2.4)

## 2020-06-05 LAB — TSH W/REFLEX TO FT4     (BH GH LMW Q YH): BKR THYROID STIMULATING HORMONE: 2.08 u[IU]/mL

## 2020-06-05 MED ORDER — LIDOCAINE MAALOX DIPHEN NYSTATIN 1:1:1:1 MAGIC MOUTHWASH (COMPOUND)
Freq: Four times a day (QID) | ORAL | 1 refills | 13.00 days | Status: AC
Start: 2020-06-05 — End: ?
  Filled 2020-06-09: qty 500, 13d supply, fill #1
  Filled 2020-06-09: qty 500, 13d supply, fill #0

## 2020-06-05 MED ORDER — MYCOPHENOLATE MOFETIL 250 MG CAPSULE
250 mg | ORAL_CAPSULE | Freq: Two times a day (BID) | ORAL | 12 refills | 60.00 days | Status: AC
Start: 2020-06-05 — End: 2020-06-19

## 2020-06-06 ENCOUNTER — Telehealth: Admit: 2020-06-06 | Payer: PRIVATE HEALTH INSURANCE | Attending: Colon & Rectal Surgery | Primary: Family Medicine

## 2020-06-06 LAB — HEMOGLOBIN A1C
BKR ESTIMATED AVERAGE GLUCOSE: 157 mg/dL
BKR HEMOGLOBIN A1C: 7.1 % — ABNORMAL HIGH (ref 4.0–5.6)

## 2020-06-06 LAB — EXOME SEQUENCE (YMG)

## 2020-06-06 LAB — VITAMIN D 25 (VITAMIN D STATUS)(MINMETABLAB)(YH): VITAMIN D 25-HYDROXY: 23 ng/mL (ref 20–50)

## 2020-06-06 LAB — TACROLIMUS LEVEL     (BH GH L LMW YH): BKR TACROLIMUS BLOOD: 12.4 ng/mL (ref 0.0–5.0)

## 2020-06-06 NOTE — Progress Notes
Adam Harper TRANSPLANT CENTERKIDNEY/[facility] TRANSPLANT PROGRAMMEDICAL FOLLOW UP VISIT CHIEF COMPLAINT/REASON FOR VISIT Adam Harper is seen in clinic to for a follow up visit regarding:- Evaluation of kidney allograft function - Assessment of immunosuppression medication efficacy and side effects- Assessment and management of blood pressureTRANSPLANT HISTORY ?	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	CMV D + / R + ?	Donor Toxo positive ?	CPRA zero, Alemtuzumab ?	9/4-9/7: admit for hyperK+, urgent HD. Bactrim switched to AtovaquoneINTERIM HISTORY  Adam Harper comes in for routine follow-up of his allograft function. Accompanied by his wife today and with many issues to discuss. Has been having painful defecation with some drops of bright red blood in the pot. He was seen by colorectal surgery back in Oct for the same issue and was diagnosed with an anal fissure but did not get any treatment for it because his symptoms had resolved by that time. He has not been constipated but stools are formed. Second issue today is burning in his mouth especially his tongue. No ulcers that he can feel. Third issue is severe fatigue and low energy since his COVID infection. Does not feel like he has recovered full from that still although does not have any shortness of breath or chest pain. Denies swelling in his legs. Finally, his wife reports poor dietary habits. Drinks up to 2L of sodas every day, eats a lot of candy, has gained weight. REVIEW OF SYSTEMS  General: no generalized weakness, fatigue, fever or chillsCardio: no chest pain, shortness of breath bipedal edemaPulm: no cough, sputum production or wheezingGI: no nausea, vomiting, diarrhea or constipation GU: no dysuria or hematuriaSkin: no new rashes or skin lesionsNeuro: no focal weakness, tremors, speech disturbanceMEDICAL HISTORY Past Medical History: Diagnosis Date ? A-V fistula (HC Code) (HC CODE)   right arm ? Anemia  ? Anemia in ESRD (end-stage renal disease) (HC Code) 10/24/2014  Added automatically from request for surgery [id number]  Added automatically from request for surgery [id number] Overview:  Added automatically from request for surgery [id number] ? Anemia in ESRD (end-stage renal disease) (HC Code) (HC CODE) 10/24/2014  Formatting of this note might be different from the original. Overview:  Added automatically from request for surgery [id number] Overview:  Added automatically from request for surgery [id number] ? 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 Cirrhosis (HC Code) (HC CODE)  ? ESRD on hemodialysis (HC Code) (HC CODE)   Mon-Wed-Fri ? GERD (gastroesophageal reflux disease)  ? Hypercholesteremia  ? Hyperparathyroidism (HC Code) (HC CODE)  ? Hypertension  ? Infected prosthetic vascular graft, initial encounter (HC Code) (HC CODE) 10/24/2014 ? Influenza A  ? Morbid obesity (HC Code) (HC CODE) 02/03/2018 ? Obstructive sleep apnea   pt uses CPAP ? Secondary hyperparathyroidism of renal origin (HC Code) (HC CODE)  ? Thoracic aortic aneurysm (HC Code) (HC CODE)   Past Surgical History: Procedure Laterality Date ? AV FISTULA PLACEMENT Left   left lower arm unsuccessful, then moved to upper arm 2011, then had problems in 10/2013 - infection  No family history on file. Social History Tobacco Use ? Smoking status: Former Smoker   Packs/day: 0.25   Years: 25.00   Pack years: 6.25   Types: Cigarettes ? Smokeless tobacco: Never Used Substance Use Topics ? Alcohol use: No  Past medical, surgical, family and social history reviewed. No changes at this time.MEDICATIONS Outpatient Encounter Medications as of 06/05/2020 Medication Sig ? amLODIPine (NORVASC) 10 mg tablet Take 1 tablet (10 mg total) by mouth every morning. ?  atorvastatin (LIPITOR) 40 mg tablet Take 1 tablet (40 mg total) by mouth daily. ? atovaquone (MEPRON) 750 mg/5 mL suspension Take 10 mLs (1,500 mg total) by mouth daily with dinner. ? carvediloL (COREG) 25 mg Immediate Release tablet Take 1 tablet (25 mg total) by mouth 2 (two) times daily with breakfast and dinner. ? chlorthalidone (HYGROTEN) 50 mg tablet Take 1 tablet (50 mg total) by mouth daily. ? magnesium oxide (MAG-OX) 400 mg (241.3 mg magnesium) tablet Take 1 tablet (400 mg total) by mouth 2 (two) times daily. ? omeprazole (PRILOSEC) 20 mg capsule Take 1 capsule (20 mg total) by mouth daily. ? predniSONE (DELTASONE) 5 mg tablet Take 12 tablets (60 mg total) by mouth daily for 1 day, THEN 11 tablets (55 mg total) daily for 1 day, THEN 10 tablets (50 mg total) daily for 1 day, THEN 9 tablets (45 mg total) daily for 1 day, THEN 8 tablets (40 mg total) daily for 1 day, THEN 7 tablets (35 mg total) daily for 1 day, THEN 6 tablets (30 mg total) daily for 7 days, THEN 5 tablets (25 mg total) daily for 7 days, THEN 3 tablets (15 mg total) daily for 7 days, THEN 2 tablets (10 mg total) daily for 7 days. ? mycophenolate mofetil (CELLCEPT) 250 mg capsule Take 2 capsules (500 mg total) by mouth 2 (two) times daily. ? predniSONE (DELTASONE) 5 mg tablet Take 1 tablet (5 mg total) by mouth daily. Take after completion of taper. ? tacrolimus XR (ENVARSUS XR) 4 mg 24 hr tablet Take 3 tablets (12 mg total) by mouth daily. ? [DISCONTINUED] mycophenolate mofetil (CELLCEPT) 250 mg capsule Take 4 capsules (1,000 mg total) by mouth 2 (two) times daily. No Known AllergiesPHYSICAL EXAM  BP (!) 144/71  - Pulse 67  - Temp (!) 96.7 ?F (35.9 ?C) (Temporal)  - Ht 5' 7.91 (1.725 m)  - Wt 120.2 kg  - SpO2 96%  - BMI 40.40 kg/m? Wt Readings from Last 4 Encounters: 06/05/20 120.2 kg 05/08/20 118.8 kg 02/29/20 115.7 kg 01/31/20 114.3 kg   Gen: not in acute distressHEENT: atraumatic, pale palpebral conjunctivaNeck: no cervical lymphadenopathy, trachea midlineCV: regular rate and rhythm, distinct S1 and S2, no murmurs appreciated Resp: clear breath sounds bilaterally, no wheezing or crackles appreciatedAbd: non-distended, normoactive bowel sounds, non-tender to palpationExt: no pedal edemaNeuro: Awake, alert, no tremors, motor function grossly intactPERTINENT LABS Lab Results Component Value Date  WBC 6.9 06/04/2020  HGB 13.6 06/04/2020  HCT 41.10 06/04/2020  PLT 214 06/04/2020  GLU 114 (H) 06/04/2020  BUN 22 (H) 06/04/2020  CREATININE 1.91 (H) 06/04/2020  CO2 31 (H) 06/04/2020  CL 97 (L) 06/04/2020  NA 142 06/04/2020  K 3.5 06/04/2020  CALCIUM 8.8 06/04/2020  EGFR 8 06/21/2012  PRCRRAU 0.23 (H) 06/04/2020  PHOS 4.6 (H) 06/04/2020  PTH 35.2 05/21/2020  VITAMIND25HY 26 11/12/2009  HGBA1C 5.7 (H) 11/05/2019  IRON 80 01/30/2020  TIBC 275 01/30/2020  FERRITIN 1,163 (H) 01/30/2020 Lab Results Component Value Date  TACROLIMUS 5.3 06/04/2020 ASSESSMENT AND PLAN Adam Harper is seen in clinic today for a follow up visit regarding the patient's renal transplant.ESRD S/P KTProtocol biopsy 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 s/p steroid pulse and still on taper Cr has improved to 1.8; his best yet since the transplant ImmunosuppressionEnv 8 mg, prednisone 5 mg and MMF was on hold-Have increased Env dose to 12 mg (prior dose before his COVID/CMV viremia) for a level of 5.3 and resumed half-dose MMF (500 mg BID) given his recent  rejection-Will complete his steroid taper as outlined and return to 5 mg dose Prophylaxis and SurveillanceBK ND 04/24/20, will add on a BK virus PCR also to his labs todayRemains on Mepron for PJP ppxCMV viremia:Resolved; will resume half dose of his MMF back now that viremia has cleared and he was recently treated for rejectionBlood Pressure managementOverall well-controlled; no changes to meds todayWeakness, fatigue:Suspect this is aftermath of his COVID infectionWill check CK, TSH, LDH, A1c to rule out other organic causes of weaknessAdvised weight loss and dietary modifications; has put on 20 lbs since his transplantF/U in 2 weeksPriyanka Chapman Moss, MBBSTransplant Nephrology Fellow

## 2020-06-06 NOTE — Telephone Encounter
Left voicemail for patient to try and schedule him with Dr Cristobal Goldmann for Monday afternoon if he can come in per his request. Appointment Request [name] Sent: Fri June 06, 2020 10:29 AM To: [name] Rectal Surg Scheduling  Adam Harper MRN: [id number] DOB: 1960/08/04 Pt Home: [phone]   Entered: [phone]    MessageAppointment Request From: [name]  With Provider: Dolores Patty, MD [facility] GI Surgery [name] 2]  Preferred Date Range: Any  Preferred Times: Any Time  Reason for visit: i need ?a Doctor ?help  Comments: a lot of burn when i ?use the toilet PCP and CenterPrimary Care Provider Phone [name], [place] [phone] Center: [facility] [address]

## 2020-06-07 LAB — CYTOMEGALOVIRUS QUANTITATIVE PCR, PLASMA     (BH GH L LMW YH): BKR CMV QUANTITATIVE PCR: NOT DETECTED x 1000/??L (ref 2.00–7.60)

## 2020-06-13 ENCOUNTER — Telehealth: Admit: 2020-06-13 | Payer: PRIVATE HEALTH INSURANCE | Attending: Colon & Rectal Surgery | Primary: Family Medicine

## 2020-06-13 NOTE — Telephone Encounter
Returned patient call to schedule appointment with Dr. Cristobal Goldmann.  Last seen October 2021

## 2020-06-16 ENCOUNTER — Encounter: Admit: 2020-06-16 | Payer: PRIVATE HEALTH INSURANCE | Attending: Internal Medicine | Primary: Family Medicine

## 2020-06-16 MED ORDER — ATOVAQUONE 750 MG/5 ML ORAL SUSPENSION
7505 mg/5 mL | ORAL | 12 refills | 30.00 days | Status: AC
Start: 2020-06-16 — End: 2020-06-19

## 2020-06-18 ENCOUNTER — Inpatient Hospital Stay: Admit: 2020-06-18 | Discharge: 2020-06-18 | Payer: PRIVATE HEALTH INSURANCE | Primary: Family Medicine

## 2020-06-18 DIAGNOSIS — Z796 Long-term use of immunosuppressant medication: Secondary | ICD-10-CM

## 2020-06-18 DIAGNOSIS — Z79899 Other long term (current) drug therapy: Secondary | ICD-10-CM

## 2020-06-18 DIAGNOSIS — Z940 Kidney transplant status: Secondary | ICD-10-CM

## 2020-06-18 LAB — PROTEIN, TOTAL W/CREATININE, URINE, RANDOM     (BH GH LMW YH)
BKR CREATININE, URINE, RANDOM: 94 mg/dL
BKR PROTEIN URINE RANDOM: 0.22 g/L
BKR PROTEIN/CREATININE RATIO, URINE, RANDOM: 0.23 mg/mg Cr — ABNORMAL HIGH (ref <0.10)

## 2020-06-18 LAB — ALBUMIN/CREATININE PANEL, URINE, RANDOM
BKR ALBUMIN, URINE, RANDOM: 89 mg/L
BKR ALBUMIN/CREATININE RATIO, URINE, RANDOM: 95 mg/g{creat} — ABNORMAL HIGH (ref <30.0)
BKR CREATININE, URINE, RANDOM: 94 mg/dL

## 2020-06-18 LAB — TACROLIMUS LEVEL     (BH GH L LMW YH): BKR TACROLIMUS BLOOD: 9.2 ng/mL

## 2020-06-19 ENCOUNTER — Inpatient Hospital Stay: Admit: 2020-06-19 | Discharge: 2020-06-19 | Payer: PRIVATE HEALTH INSURANCE | Primary: Family Medicine

## 2020-06-19 ENCOUNTER — Ambulatory Visit
Admit: 2020-06-19 | Payer: PRIVATE HEALTH INSURANCE | Attending: Student in an Organized Health Care Education/Training Program | Primary: Family Medicine

## 2020-06-19 DIAGNOSIS — Z992 Dependence on renal dialysis: Secondary | ICD-10-CM

## 2020-06-19 DIAGNOSIS — Z6839 Body mass index (BMI) 39.0-39.9, adult: Secondary | ICD-10-CM

## 2020-06-19 DIAGNOSIS — D631 Anemia in chronic kidney disease: Secondary | ICD-10-CM

## 2020-06-19 DIAGNOSIS — K219 Gastro-esophageal reflux disease without esophagitis: Secondary | ICD-10-CM

## 2020-06-19 DIAGNOSIS — Z4822 Encounter for aftercare following kidney transplant: Secondary | ICD-10-CM

## 2020-06-19 DIAGNOSIS — I120 Hypertensive chronic kidney disease with stage 5 chronic kidney disease or end stage renal disease: Secondary | ICD-10-CM

## 2020-06-19 DIAGNOSIS — Z87891 Personal history of nicotine dependence: Secondary | ICD-10-CM

## 2020-06-19 DIAGNOSIS — G4733 Obstructive sleep apnea (adult) (pediatric): Secondary | ICD-10-CM

## 2020-06-19 DIAGNOSIS — N186 End stage renal disease: Secondary | ICD-10-CM

## 2020-06-19 DIAGNOSIS — Z7952 Long term (current) use of systemic steroids: Secondary | ICD-10-CM

## 2020-06-19 DIAGNOSIS — Z940 Kidney transplant status: Secondary | ICD-10-CM

## 2020-06-19 DIAGNOSIS — E1122 Type 2 diabetes mellitus with diabetic chronic kidney disease: Secondary | ICD-10-CM

## 2020-06-19 DIAGNOSIS — N2581 Secondary hyperparathyroidism of renal origin: Secondary | ICD-10-CM

## 2020-06-19 DIAGNOSIS — Z79899 Other long term (current) drug therapy: Secondary | ICD-10-CM

## 2020-06-19 LAB — BASIC METABOLIC PANEL
BKR ANION GAP: 14 (ref 7–17)
BKR BLOOD UREA NITROGEN: 22 mg/dL — ABNORMAL HIGH (ref 6–20)
BKR BUN / CREAT RATIO: 10.7 (ref 8.0–23.0)
BKR CALCIUM: 9 mg/dL — ABNORMAL LOW (ref 8.8–10.2)
BKR CHLORIDE: 98 mmol/L (ref 98–107)
BKR CO2: 29 mmol/L (ref 20–30)
BKR CREATININE: 2.06 mg/dL — ABNORMAL HIGH (ref 0.40–1.30)
BKR EGFR (AFR AMER): 41 mL/min/{1.73_m2} (ref >60)
BKR EGFR (NON AFRICAN AMERICAN): 33 mL/min/{1.73_m2} (ref >60)
BKR GLUCOSE: 149 mg/dL — ABNORMAL HIGH (ref 70–100)
BKR POTASSIUM: 4 mmol/L (ref 3.3–5.3)
BKR SODIUM: 141 mmol/L (ref 136–144)

## 2020-06-19 LAB — BK VIRUS BY PCR, QUANTITATIVE, PLASMA: BKR BK VIRUS, QUANTITATIVE PCR, PLASMA: NOT DETECTED

## 2020-06-19 MED ORDER — ATOVAQUONE 750 MG/5 ML ORAL SUSPENSION
7505 mg/5 mL | ORAL | 12 refills | 30.00 days | Status: AC
Start: 2020-06-19 — End: 2021-07-01
  Filled 2020-06-21: qty 300, 30d supply, fill #0

## 2020-06-19 MED ORDER — TACROLIMUS XR 1 MG TABLET,EXTENDED RELEASE 24 HR
1 mg | ORAL_TABLET | Freq: Every day | ORAL | 12 refills | 45.00 days | Status: AC
Start: 2020-06-19 — End: 2020-06-21

## 2020-06-19 MED ORDER — MYCOPHENOLATE MOFETIL 250 MG CAPSULE
250 mg | ORAL_CAPSULE | Freq: Two times a day (BID) | ORAL | 12 refills | 40.00 days | Status: AC
Start: 2020-06-19 — End: 2020-08-04

## 2020-06-19 MED ORDER — TACROLIMUS XR 4 MG TABLET,EXTENDED RELEASE 24 HR
4 mg | ORAL_TABLET | Freq: Every day | ORAL | 12 refills | 45.00 days | Status: AC
Start: 2020-06-19 — End: 2020-06-21

## 2020-06-19 NOTE — Progress Notes
[place] [facility]PLANT CENTERKIDNEY/[facility]AN[facility] TRANSPLANT PROGRAMMEDICAL FOLLOW UP VISIT CHIEF COMPLAINT/REASON FOR VISIT ORLA JOLLIFF is seen in clinic to for a follow up visit regarding:- Evaluation of kidney allograft function - Assessment of immunosuppression medication efficacy and side effects- Assessment and management of blood pressureTRANSPLANT HISTORY ?	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	CMV D + / R + ?	Donor Toxo positive ?	CPRA zero, Alemtuzumab ?	9/4-9/7: admit for hyperK+, urgent HD. Bactrim switched to AtovaquoneINTERIM HISTORY  BUTCH OTTERSON comes in for routine follow-up of his allograft function. He feels better since his last visit. Has stopped drinking soda and sweets. BP control unclear; feels his machine is not working properly. Energy levels are better, oral burning has stopped, he has not used the Magic mouthwash though. REVIEW OF SYSTEMS  General: no generalized weakness, fatigue, fever or chillsCardio: no chest pain, shortness of breath bipedal edemaPulm: no cough, sputum production or wheezingGI: no nausea, vomiting, diarrhea or constipation GU: no dysuria or hematuriaSkin: no new rashes or skin lesionsNeuro: no focal weakness, tremors, speech disturbanceMEDICAL HISTORY Past Medical History: Diagnosis Date ? A-V fistula (HC Code) (HC CODE)   right arm ? Anemia  ? Anemia in ESRD (end-stage renal disease) (HC Code) 10/24/2014  Added automatically from request for surgery [id number]  Added automatically from request for surgery [id number] Overview:  Added automatically from request for surgery [id number] ? Anemia in ESRD (end-stage renal disease) (HC Code) (HC CODE) 10/24/2014  Formatting of this note might be different from the original. Overview:  Added automatically from request for surgery [id number] Overview:  Added automatically from request for surgery [id number] ? 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 Cirrhosis (HC Code) (HC CODE)  ? ESRD on hemodialysis (HC Code) (HC CODE)   Mon-Wed-Fri ? GERD (gastroesophageal reflux disease)  ? Hypercholesteremia  ? Hyperparathyroidism (HC Code) (HC CODE)  ? Hypertension  ? Infected prosthetic vascular graft, initial encounter (HC Code) (HC CODE) 10/24/2014 ? Influenza A  ? Morbid obesity (HC Code) (HC CODE) 02/03/2018 ? Obstructive sleep apnea   pt uses CPAP ? Secondary hyperparathyroidism of renal origin (HC Code) (HC CODE)  ? Thoracic aortic aneurysm (HC Code) (HC CODE)   Past Surgical History: Procedure Laterality Date ? AV FISTULA PLACEMENT Left   left lower arm unsuccessful, then moved to upper arm 2011, then had problems in 10/2013 - infection  No family history on file. Social History Tobacco Use ? Smoking status: Former Smoker   Packs/day: 0.25   Years: 25.00   Pack years: 6.25   Types: Cigarettes ? Smokeless tobacco: Never Used Substance Use Topics ? Alcohol use: No  Past medical, surgical, family and social history reviewed. No changes at this time.MEDICATIONS Outpatient Encounter Medications as of 06/19/2020 Medication Sig ? amLODIPine (NORVASC) 10 mg tablet Take 1 tablet (10 mg total) by mouth every morning. ? atorvastatin (LIPITOR) 40 mg tablet Take 1 tablet (40 mg total) by mouth daily. ? atovaquone (MEPRON) 750 mg/5 mL suspension Take 10 mLs (1,500 mg total) by mouth daily with dinner. ? carvediloL (COREG) 25 mg Immediate Release tablet Take 1 tablet (25 mg total) by mouth 2 (two) times daily with breakfast and dinner. ? chlorthalidone (HYGROTEN) 50 mg tablet Take 1 tablet (50 mg total) by mouth daily. ? lidocaine maalox diphenhydramine nystatin (MAGIC MOUTHWASH + NYSTATIN) 1:1:1:1 Swish and swallow 10 mLs 4 (four) times daily. ? magnesium oxide (MAG-OX) 400 mg (241.3 mg magnesium) tablet Take 1 tablet (400 mg total) by mouth 2 (two)  times daily. ? mycophenolate mofetil (CELLCEPT) 250 mg capsule Take 2 capsules (500 mg total) by mouth 2 (two) times daily. ? omeprazole (PRILOSEC) 20 mg capsule Take 1 capsule (20 mg total) by mouth daily. ? predniSONE (DELTASONE) 5 mg tablet Take 1 tablet (5 mg total) by mouth daily. Take after completion of taper. ? predniSONE (DELTASONE) 5 mg tablet Take 12 tablets (60 mg total) by mouth daily for 1 day, THEN 11 tablets (55 mg total) daily for 1 day, THEN 10 tablets (50 mg total) daily for 1 day, THEN 9 tablets (45 mg total) daily for 1 day, THEN 8 tablets (40 mg total) daily for 1 day, THEN 7 tablets (35 mg total) daily for 1 day, THEN 6 tablets (30 mg total) daily for 7 days, THEN 5 tablets (25 mg total) daily for 7 days, THEN 3 tablets (15 mg total) daily for 7 days, THEN 2 tablets (10 mg total) daily for 7 days. ? tacrolimus XR (ENVARSUS XR) 4 mg 24 hr tablet Take 3 tablets (12 mg total) by mouth daily. No Known AllergiesPHYSICAL EXAM  BP (!) 147/76 (Position: Sitting)  - Pulse 70  - Temp 97.1 ?F (36.2 ?C)  - SpO2 97% Wt Readings from Last 4 Encounters: 06/05/20 120.2 kg 05/08/20 118.8 kg 02/29/20 115.7 kg 01/31/20 114.3 kg   Gen: not in acute distressHEENT: atraumatic, pale palpebral conjunctivaNeck: no cervical lymphadenopathy, trachea midlineCV: regular rate and rhythm, distinct S1 and S2, no murmurs appreciated Resp: clear breath sounds bilaterally, no wheezing or crackles appreciatedAbd: non-distended, normoactive bowel sounds, non-tender to palpationExt: no pedal edemaNeuro: Awake, alert, no tremors, motor function grossly intactPERTINENT LABS Lab Results Component Value Date  WBC 8.1 06/05/2020  HGB 13.8 06/05/2020  HCT 41.80 06/05/2020  PLT 202 06/05/2020  GLU 149 (H) 06/05/2020  BUN 19 06/05/2020  CREATININE 1.83 (H) 06/05/2020  CO2 29 06/05/2020  CL 97 (L) 06/05/2020  NA 138 06/05/2020  K 4.0 06/05/2020  CALCIUM 8.9 06/05/2020 EGFR 8 06/21/2012  PRCRRAU 0.23 (H) 06/18/2020  PHOS 3.4 06/05/2020  PTH 35.2 05/21/2020  VITAMIND25HY 23 06/05/2020  HGBA1C 7.1 (H) 06/05/2020  IRON 80 01/30/2020  TIBC 275 01/30/2020  FERRITIN 1,163 (H) 01/30/2020 Lab Results Component Value Date  TACROLIMUS 9.2 06/18/2020 ASSESSMENT AND PLAN GEARLD KERSTEIN is seen in clinic today for a follow up visit regarding the patient's renal transplant.ESRD S/P KTProtocol biopsy 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 s/p steroid pulse and still on taper Cr up to 2.0 today, will continue to monitorImmunosuppressionEnv 12 mg, prednisone 5 mg and MMF 500 mg BIDWell-timed level is 9; dropped to 10 mg MMF increased to 750 mg BID - will keep same Prophylaxis and SurveillanceBK ND 04/24/20, repeat is pendingCMV viremia:Resolved with several negative quant PCRsBlood Pressure managementOverall well-controlled; no changes to meds todayPost-transplant DM:A1c elevated at 7.1%Relating to recent steroid pulse and high soda intake-Have notified txp pharmacy who will reach out to him for glycemic control and with glucometer for sugar monitoring; reviewed dietary modifications to help optimize control F/U in 1 monthPriyanka Chapman Moss, MBBSTransplant Nephrology Fellow

## 2020-06-20 ENCOUNTER — Encounter: Admit: 2020-06-20 | Payer: PRIVATE HEALTH INSURANCE | Attending: Gastroenterology | Primary: Family Medicine

## 2020-06-20 ENCOUNTER — Encounter: Admit: 2020-06-20 | Payer: PRIVATE HEALTH INSURANCE | Primary: Family Medicine

## 2020-06-20 ENCOUNTER — Telehealth: Admit: 2020-06-20 | Payer: PRIVATE HEALTH INSURANCE | Primary: Family Medicine

## 2020-06-20 DIAGNOSIS — Z940 Kidney transplant status: Secondary | ICD-10-CM

## 2020-06-20 MED ORDER — LANCETS
12 refills | Status: AC
Start: 2020-06-20 — End: 2021-08-07

## 2020-06-20 MED ORDER — BLOOD SUGAR DIAGNOSTIC STRIPS
ORAL_STRIP | 12 refills | Status: AC
Start: 2020-06-20 — End: 2021-08-07

## 2020-06-20 MED ORDER — BLOOD-GLUCOSE METER
1 refills | Status: AC
Start: 2020-06-20 — End: 2020-12-19

## 2020-06-20 NOTE — Other
TRANSPLANT PHARMACY:  POST-TRANSPLANT HYPERGLYCEMIA FOLLOW-UPLuis A Harper has been referred for diabetes management.I attempted to speak with [name] regarding glycemic management post-transplant, but had to leave a voicemail. Plan to follow-up via telephone on Monday.Dessa Phi, PharmD4/10/2020

## 2020-06-20 NOTE — Other
TRANSPLANT PHARMACY:  POST-TRANSPLANT HYPERGLYCEMIA FOLLOW-UPLuis A Harper has been referred for diabetes management.I spoke with [name] using spanish interpreter [phone] regarding glycemic management post-transplant. Glucose levels have been increasing compared to previous readings.The patient tests his sugars: 0 time(s) a day. The patient has not been testing his sugars because he does not currently have testing supplies. Patient is not using MyChart glucose flowsheet to document blood glucose. Patient reported only being told of BG reading of 149 from the office. His last A1c was 7.1 on 3/24. Patient's HbA1c are as follows:Lab Results Component Value Date  HGBA1C 7.1 (H) 06/05/2020  HGBA1C 5.7 (H) 11/05/2019  HGBA1C 5.5 10/03/2018  The patient is not due for a HbA1c to assess BG control.Patient currently does not have diabetes medication regimen.The following is his new regimen: blood sugar testing suppliesInstructed patient to start testing blood sugars 3 times daily before meals. The patient does require refills. Prescriptions for testing supplies were sent to [facility] on [address] Patient plans to pick up supplies tonight. Plan to follow-up early nect week (week of 4/11).[name]4/10/2020

## 2020-06-21 MED ORDER — TACROLIMUS XR 1 MG TABLET,EXTENDED RELEASE 24 HR
1 mg | ORAL_TABLET | Freq: Every day | ORAL | 12 refills | 45.00 days | Status: AC
Start: 2020-06-21 — End: 2020-10-03
  Filled 2020-06-21: qty 90, 45d supply, fill #0
  Filled 2020-06-21: qty 90, 45d supply, fill #1

## 2020-06-21 MED ORDER — TACROLIMUS XR 4 MG TABLET,EXTENDED RELEASE 24 HR
4 mg | ORAL_TABLET | Freq: Every day | ORAL | 12 refills | 90.00 days | Status: AC
Start: 2020-06-21 — End: 2021-08-13

## 2020-06-21 MED FILL — CARVEDILOL IMMEDIATE RELEASE 25 MG TABLET: 25 mg | ORAL | 30 days supply | Qty: 60 | Fill #7 | Status: CP

## 2020-06-21 MED FILL — PREDNISONE 5 MG TABLET: 5 mg | ORAL | 30 days supply | Qty: 30 | Fill #0 | Status: CP

## 2020-06-21 MED FILL — PREDNISONE 5 MG TABLET: 5 mg | ORAL | 30 days supply | Qty: 30 | Fill #12 | Status: CP

## 2020-06-21 MED FILL — CHLORTHALIDONE 50 MG TABLET: 50 mg | ORAL | 90 days supply | Qty: 90 | Fill #2 | Status: CP

## 2020-06-23 ENCOUNTER — Telehealth: Admit: 2020-06-23 | Payer: PRIVATE HEALTH INSURANCE | Primary: Family Medicine

## 2020-06-23 NOTE — Other
TRANSPLANT PHARMACY:  POST-TRANSPLANT HYPERGLYCEMIA FOLLOW-UPLuis A Harper has been referred for diabetes management.I attempted to speak with [name] regarding glycemic management post-transplant but was unable to reach him. Left voice mail requesting a call back using Spanish interpreter service ID [phone].Plan to follow-up again this week.[name], PharmD4/01/2021

## 2020-06-24 ENCOUNTER — Encounter: Admit: 2020-06-24 | Payer: PRIVATE HEALTH INSURANCE | Primary: Family Medicine

## 2020-06-24 DIAGNOSIS — Z79899 Other long term (current) drug therapy: Secondary | ICD-10-CM

## 2020-06-26 ENCOUNTER — Telehealth: Admit: 2020-06-26 | Payer: PRIVATE HEALTH INSURANCE | Primary: Family Medicine

## 2020-06-26 ENCOUNTER — Encounter: Admit: 2020-06-26 | Payer: PRIVATE HEALTH INSURANCE | Attending: Nephrology | Primary: Family Medicine

## 2020-06-26 MED FILL — TACROLIMUS XR 4 MG TABLET,EXTENDED RELEASE 24 HR: 4 mg | ORAL | 30 days supply | Qty: 60 | Fill #0 | Status: CP

## 2020-06-26 MED FILL — MAGNESIUM OXIDE 400 MG (241.3 MG MAGNESIUM) TABLET: 400 mg (241.3 mg magnesium) | ORAL | 30 days supply | Qty: 60 | Fill #2 | Status: CP

## 2020-06-26 NOTE — Progress Notes
Attempted to reach [place] in order to provide counseling and education for GLP-1 agonist. Per patient's insurance, preferred product is Trulicity. Was unable to reach, and LVM instructing patient to call clinic back to discuss Trulicity education.Luberta Mutter, PharmDPGY-2 Ambulatory Care Pharmacy Resident4/14/2022

## 2020-06-27 ENCOUNTER — Telehealth: Admit: 2020-06-27 | Payer: PRIVATE HEALTH INSURANCE | Primary: Family Medicine

## 2020-06-27 MED ORDER — ERGOCALCIFEROL (VITAMIN D2) 1,250 MCG (50,000 UNIT) CAPSULE
1250 mcg (50,000 unit) | ORAL_CAPSULE | ORAL | 1 refills | 84 days | Status: AC
Start: 2020-06-27 — End: ?
  Filled 2020-07-17: qty 12, 84d supply, fill #0
  Filled 2020-07-17: qty 12, 84d supply, fill #1

## 2020-06-27 NOTE — Progress Notes
Attempted to call patient to start him on Trulicity. Was unable to reach but LVM instructing patient to call clinic for education.Luberta Mutter, PharmDPGY-2 Ambulatory Care Pharmacy Resident4/15/2022

## 2020-07-03 ENCOUNTER — Encounter: Admit: 2020-07-03 | Payer: PRIVATE HEALTH INSURANCE | Primary: Family Medicine

## 2020-07-03 DIAGNOSIS — Z940 Kidney transplant status: Secondary | ICD-10-CM

## 2020-07-03 NOTE — Other
TRANSPLANT PHARMACY:  POST-TRANSPLANT HYPERGLYCEMIA FOLLOW-UPLuis A Gopaul has been referred for diabetes management.I spoke with [name] regarding glycemic management post-transplant. Glucose levels have been decreased compared to previous readings.The patient currently sees transplant pharmacy for their diabetes management.The patient tests his sugars: 1-2 time(s) a dayPatient is not using MyChart glucose flowsheet to document blood glucose.BG readings are: 4/17 4/18 4/19 4/20 4/21 Breakfast 137 139 134 - 139 Lunch - - - - - Dinner 128 - - - 155 *2 hours after food Ohn A Moring has not experienced hypoglycemic events (e.g. BG < 70 mg/dL or s/sx including but not limited to hunger, tremor, sweating, headache). I have not counseled the patient on what to do in the setting of a hypoglycemic event.Patient's HbA1c are as follows:Lab Results Component Value Date  HGBA1C 7.1 (H) 06/05/2020  HGBA1C 5.7 (H) 11/05/2019  HGBA1C 5.5 10/03/2018  The patient is not due for a HbA1c to assess BG control.Current glucose management regimen is: nonePer protocol, the following changes were made to his regimen: no changes made. Previously confirmed with Dr. Dinah Beers GLP1 agonist would be a reasonable option for hyperglycemia. Given BG readings within target overall based on his report today, will not initiate GLP1 agonist at this time. Instructed patient to check BG a couple of times during the day including some lunchtime and dinnertime readings.The patient does not require refills.Plan to follow-up end of next week.[name], PharmD4/21/2022

## 2020-07-03 NOTE — Other
TRANSPLANT PHARMACY:  POST-TRANSPLANT HYPERGLYCEMIA FOLLOW-UPLuis A Harper has been referred for diabetes management.I spoke with Adam Harper regarding glycemic management post-transplant using Spanish interpreter (ID ([phone]).The patient currently sees transplant pharmacy for their diabetes management.The patient tests his sugars: 2-3 time(s) a dayPatient is not using MyChart glucose flowsheet to document blood glucose.BG readings are: 4/9 4/10 4/11 4/12 Breakfast 132 137 128 132 Lunch 124 161 - - Dinner 223 - 194  Adam Harper has not experienced hypoglycemic events (e.g. BG < 70 mg/dL or s/sx including but not limited to hunger, tremor, sweating, headache). I have not counseled the patient on what to do in the setting of a hypoglycemic event.Patient's HbA1c are as follows:Lab Results Component Value Date  HGBA1C 7.1 (H) 06/05/2020  HGBA1C 5.7 (H) 11/05/2019  HGBA1C 5.5 10/03/2018  The patient is not due for a HbA1c to assess BG control.Current glucose management regimen is: nonePer protocol, the following changes were made to his regimen: will reach out to Dr. Dinah Beers to discuss plan moving forward. Would consider initiation of GLP1 agonist.No recommendation made at this visit.The patient does not require refills.Plan to follow-up to communicate recommendation after confirming with Dr. Dinah Beers.[name], PharmD4/02/2021

## 2020-07-15 ENCOUNTER — Inpatient Hospital Stay: Admit: 2020-07-15 | Discharge: 2020-07-15 | Payer: PRIVATE HEALTH INSURANCE | Primary: Family Medicine

## 2020-07-15 ENCOUNTER — Encounter: Admit: 2020-07-15 | Payer: PRIVATE HEALTH INSURANCE | Primary: Family Medicine

## 2020-07-15 DIAGNOSIS — Z940 Kidney transplant status: Secondary | ICD-10-CM

## 2020-07-15 DIAGNOSIS — Z79899 Other long term (current) drug therapy: Secondary | ICD-10-CM

## 2020-07-15 LAB — PROTEIN, TOTAL W/CREATININE, URINE, RANDOM     (BH GH LMW YH)
BKR CREATININE, URINE, RANDOM: 120 mg/dL
BKR PROTEIN URINE RANDOM: 0.35 g/L
BKR PROTEIN/CREATININE RATIO, URINE, RANDOM: 0.29 mg/mg Cr — ABNORMAL HIGH (ref <0.10)

## 2020-07-15 LAB — CBC WITH AUTO DIFFERENTIAL
BKR EGFR (AFR AMER): 205 x1000/??L (ref 150–420)
BKR WAM ABSOLUTE IMMATURE GRANULOCYTES.: 0.03 x 1000/??L (ref 0.00–0.30)
BKR WAM ABSOLUTE LYMPHOCYTE COUNT.: 0.76 x 1000/??L (ref 0.60–3.70)
BKR WAM ABSOLUTE NRBC (2 DEC): 0 x 1000/??L (ref 0.00–1.00)
BKR WAM ANALYZER ANC: 4.55 x 1000/??L (ref 2.00–7.60)
BKR WAM BASOPHIL ABSOLUTE COUNT.: 0.04 x 1000/??L (ref 0.00–1.00)
BKR WAM BASOPHILS: 0.7 % (ref 0.0–1.4)
BKR WAM EOSINOPHIL ABSOLUTE COUNT.: 0.09 x 1000/??L (ref 0.00–1.00)
BKR WAM EOSINOPHILS: 1.5 % (ref 0.0–5.0)
BKR WAM HEMATOCRIT (2 DEC): 41.1 % (ref 38.50–50.00)
BKR WAM HEMOGLOBIN: 13.9 g/dL (ref 13.2–17.1)
BKR WAM IMMATURE GRANULOCYTES: 0.5 % (ref 0.0–1.0)
BKR WAM LYMPHOCYTES: 12.6 % — ABNORMAL LOW (ref 17.0–50.0)
BKR WAM MCH (PG): 32.3 pg — ABNORMAL HIGH (ref 27.0–33.0)
BKR WAM MCHC: 33.8 g/dL (ref 31.0–36.0)
BKR WAM MCV: 95.6 fL (ref 80.0–100.0)
BKR WAM MONOCYTE ABSOLUTE COUNT.: 0.58 x 1000/??L (ref 0.00–1.00)
BKR WAM MONOCYTES: 9.6 % (ref 4.0–12.0)
BKR WAM MPV: 10.3 fL (ref 8.0–12.0)
BKR WAM NEUTROPHILS: 75.1 % — ABNORMAL HIGH (ref 39.0–72.0)
BKR WAM NUCLEATED RED BLOOD CELLS: 0 % (ref 0.0–1.0)
BKR WAM PLATELETS: 205 x1000/ÂµL (ref 150–420)
BKR WAM RDW-CV: 13.7 % (ref 11.0–15.0)
BKR WAM RED BLOOD CELL COUNT.: 4.3 M/??L (ref 4.00–6.00)
BKR WAM WHITE BLOOD CELL COUNT: 6.1 x1000/??L (ref 4.0–11.0)

## 2020-07-15 LAB — ALBUMIN/CREATININE PANEL, URINE, RANDOM
BKR ALBUMIN, URINE, RANDOM: 179 mg/L
BKR ALBUMIN/CREATININE RATIO, URINE, RANDOM: 149.4 mg/g Cr — ABNORMAL HIGH (ref <30.0)
BKR CREATININE, URINE, RANDOM: 120 mg/dL

## 2020-07-15 LAB — BASIC METABOLIC PANEL
BKR ANION GAP: 14 g/dL (ref 7–17)
BKR BLOOD UREA NITROGEN: 23 mg/dL — ABNORMAL HIGH (ref 6–20)
BKR BUN / CREAT RATIO: 11.8 % (ref 8.0–23.0)
BKR CALCIUM: 9.1 mg/dL (ref 8.8–10.2)
BKR CHLORIDE: 102 mmol/L (ref 98–107)
BKR CO2: 27 mmol/L (ref 20–30)
BKR CREATININE: 1.95 mg/dL — ABNORMAL HIGH (ref 0.40–1.30)
BKR EGFR (NON AFRICAN AMERICAN): 36 mL/min/{1.73_m2} (ref >60)
BKR GLUCOSE: 116 mg/dL — ABNORMAL HIGH (ref 70–100)
BKR POTASSIUM: 3.3 mmol/L (ref 3.3–5.3)
BKR SODIUM: 143 mmol/L (ref 136–144)

## 2020-07-15 LAB — MAGNESIUM: BKR MAGNESIUM: 1.6 mg/dL — ABNORMAL LOW (ref 1.7–2.4)

## 2020-07-15 LAB — TACROLIMUS LEVEL     (BH GH L LMW YH): BKR TACROLIMUS BLOOD: 5.1 ng/mL

## 2020-07-15 LAB — PHOSPHORUS     (BH GH L LMW YH): BKR PHOSPHORUS: 4.2 mg/dL (ref 2.2–4.5)

## 2020-07-15 NOTE — Progress Notes
MD unavailable, verbal obtained from Dr. Phylliss Blakes, orders placed.

## 2020-07-16 LAB — BK VIRUS BY PCR, QUANTITATIVE, PLASMA: BKR BK VIRUS, QUANTITATIVE PCR, PLASMA: NOT DETECTED

## 2020-07-16 LAB — CYTOMEGALOVIRUS QUANTITATIVE PCR, PLASMA     (BH GH L LMW YH): BKR CMV QUANTITATIVE PCR: NOT DETECTED

## 2020-07-17 ENCOUNTER — Ambulatory Visit
Admit: 2020-07-17 | Payer: PRIVATE HEALTH INSURANCE | Attending: Student in an Organized Health Care Education/Training Program | Primary: Family Medicine

## 2020-07-17 DIAGNOSIS — Z940 Kidney transplant status: Secondary | ICD-10-CM

## 2020-07-17 MED FILL — CARVEDILOL IMMEDIATE RELEASE 25 MG TABLET: 25 mg | ORAL | 30 days supply | Qty: 60 | Fill #8 | Status: CP

## 2020-07-17 MED FILL — ATOVAQUONE 750 MG/5 ML ORAL SUSPENSION: 750 mg/5 mL | ORAL | 30 days supply | Qty: 300 | Fill #1 | Status: CP

## 2020-07-17 MED FILL — PREDNISONE 5 MG TABLET: 5 mg | ORAL | 30 days supply | Qty: 30 | Fill #1 | Status: CP

## 2020-07-17 NOTE — Progress Notes
[facility] TRANSPLANT CENTERKIDNEY/PANCREAS TRANSPLANT PROGRAMMEDICAL FOLLOW UP VISIT CHIEF COMPLAINT/REASON FOR VISIT Adam Harper is seen in clinic to for a follow up visit regarding:- Evaluation of kidney allograft function - Assessment of immunosuppression medication efficacy and side effects- Assessment and management of blood pressureTRANSPLANT HISTORY ?	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	CMV D + / R + ?	Donor Toxo positive ?	CPRA zero, Alemtuzumab ?	9/4-9/7: admit for hyperK+, urgent HD. Bactrim switched to AtovaquoneINTERIM HISTORY  Adam Harper comes in for routine follow-up of his allograft function. He feels much better overall. Working with pharmacy for glycemic management; brings reading from his glucometer which show much improved glycemic control just with dietary modification as well as completion of steroid taper. Blood pressure not frequently checked at home. Denies SOB, LE swelling, chest pain. Overall doing well. Interview conducted using video interpretor. REVIEW OF SYSTEMS  General: no generalized weakness, fatigue, fever or chillsCardio: no chest pain, shortness of breath bipedal edemaPulm: no cough, sputum production or wheezingGI: no nausea, vomiting, diarrhea or constipation GU: no dysuria or hematuriaSkin: no new rashes or skin lesionsNeuro: no focal weakness, tremors, speech disturbanceMEDICAL HISTORY Past Medical History: Diagnosis Date ? A-V fistula (HC Code) (HC CODE)   right arm ? Anemia  ? Anemia in ESRD (end-stage renal disease) (HC Code) 10/24/2014  Added automatically from request for surgery [id number]  Added automatically from request for surgery [id number] Overview:  Added automatically from request for surgery [id number] ? Anemia in ESRD (end-stage renal disease) (HC Code) (HC CODE) 10/24/2014  Formatting of this note might be different from the original. Overview: Added automatically from request for surgery [id number] Overview:  Added automatically from request for surgery [id number] ? 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 Cirrhosis (HC Code) (HC CODE)  ? ESRD on hemodialysis (HC Code) (HC CODE)   Mon-Wed-Fri ? GERD (gastroesophageal reflux disease)  ? Hypercholesteremia  ? Hyperparathyroidism (HC Code) (HC CODE)  ? Hypertension  ? Infected prosthetic vascular graft, initial encounter (HC Code) (HC CODE) 10/24/2014 ? Influenza A  ? Morbid obesity (HC Code) (HC CODE) 02/03/2018 ? Obstructive sleep apnea   pt uses CPAP ? Secondary hyperparathyroidism of renal origin (HC Code) (HC CODE)  ? Thoracic aortic aneurysm (HC Code) (HC CODE)   Past Surgical History: Procedure Laterality Date ? AV FISTULA PLACEMENT Left   left lower arm unsuccessful, then moved to upper arm 2011, then had problems in 10/2013 - infection  No family history on file. Social History Tobacco Use ? Smoking status: Former Smoker   Packs/day: 0.25   Years: 25.00   Pack years: 6.25   Types: Cigarettes ? Smokeless tobacco: Never Used Substance Use Topics ? Alcohol use: No  Past medical, surgical, family and social history reviewed. No changes at this time.MEDICATIONS Outpatient Encounter Medications as of 07/17/2020 Medication Sig ? amLODIPine (NORVASC) 10 mg tablet Take 1 tablet (10 mg total) by mouth every morning. ? atorvastatin (LIPITOR) 40 mg tablet Take 1 tablet (40 mg total) by mouth daily. ? atovaquone (MEPRON) 750 mg/5 mL suspension Take 10 mLs (1,500 mg total) by mouth daily with dinner. ? 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 Please dispense glucometer covered by patient's insurance. ? 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 Please dispense supplies covered by patient's insurance. ? carvediloL (COREG) 25 mg Immediate Release tablet Take 1 tablet (25 mg  total) by mouth 2 (two) times daily with breakfast and dinner. ? chlorthalidone (HYGROTEN) 50 mg tablet Take 1 tablet (50 mg total) by mouth daily. ? ergocalciferol (VITAMIN D2) 1,250 mcg (50,000 unit) capsule Take 1 capsule (50,000 Units total) by mouth once a week. ? 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 Please dispense supplies covered by patient's insurance. ? lidocaine maalox diphenhydramine nystatin (MAGIC MOUTHWASH + NYSTATIN) 1:1:1:1 Swish and swallow 10 mLs 4 (four) times daily. ? magnesium oxide (MAG-OX) 400 mg (241.3 mg magnesium) tablet Take 1 tablet (400 mg total) by mouth 2 (two) times daily. ? mycophenolate mofetil (CELLCEPT) 250 mg capsule Take 3 capsules (750 mg total) by mouth 2 (two) times daily. ? omeprazole (PRILOSEC) 20 mg capsule Take 1 capsule (20 mg total) by mouth daily. ? predniSONE (DELTASONE) 5 mg tablet Take 1 tablet (5 mg total) by mouth daily. Take after completion of taper. ? tacrolimus XR (ENVARSUS XR) 1 mg 24 hr tablet Take 2 tablets (2 mg total) by mouth daily. (Take 2 tablets by mouth daily along with 2 tablets of the 4 mg dose to make a total of 10 mg.) ? tacrolimus XR (ENVARSUS XR) 4 mg 24 hr tablet Take 2 tablets (8 mg total) by mouth daily. (Take 2 tablets by mouth daily along with 2 tablets of 1 mg to make a total of 10 mg). No Known AllergiesPHYSICAL EXAM  BP (!) 146/75 (Site: l a, Position: Sitting, Cuff Size: Large) Comment: reported to MD - Pulse (!) 94  - Temp 97.2 ?F (36.2 ?C) (Temporal)  - Ht 5' 8.07 (1.729 m)  - Wt 122.6 kg  - SpO2 94%  - BMI 41.00 kg/m? Wt Readings from Last 4 Encounters: 07/17/20 122.6 kg 06/19/20 117.9 kg 06/05/20 120.2 kg 05/08/20 118.8 kg   Gen: not in acute distressHEENT: atraumatic, pale palpebral conjunctivaNeck: no cervical lymphadenopathy, trachea midlineCV: regular rate and rhythm, distinct S1 and S2, no murmurs appreciated Resp: clear breath sounds bilaterally, no wheezing or crackles appreciatedAbd: non-distended, normoactive bowel sounds, non-tender to palpationExt: no pedal edemaNeuro: Awake, alert, no tremors, motor function grossly intactPERTINENT LABS Lab Results Component Value Date  WBC 6.1 07/15/2020  HGB 13.9 07/15/2020  HCT 41.10 07/15/2020  PLT 205 07/15/2020  GLU 116 (H) 07/15/2020  BUN 23 (H) 07/15/2020  CREATININE 1.95 (H) 07/15/2020  CO2 27 07/15/2020  CL 102 07/15/2020  NA 143 07/15/2020  K 3.3 07/15/2020  CALCIUM 9.1 07/15/2020  EGFR 8 06/21/2012  PRCRRAU 0.29 (H) 07/15/2020  PHOS 4.2 07/15/2020  PTH 35.2 05/21/2020  VITAMIND25HY 23 06/05/2020  HGBA1C 7.1 (H) 06/05/2020  IRON 80 01/30/2020  TIBC 275 01/30/2020  FERRITIN 1,163 (H) 01/30/2020 Lab Results Component Value Date  TACROLIMUS 5.1 07/15/2020 ASSESSMENT AND PLAN AHLIJAH RAIA is seen in clinic today for a follow up visit regarding the patient's renal transplant.ESRD S/P KTProtocol biopsy 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 s/p steroid pulse and taper now completeCr 1.95 on last check; he usually resides between 1.8-2.0Lytes and acid/base stableAdvised increase K in his dietImmunosuppressionEnv 10 mg, prednisone 5 mg and MMF 750 mg BIDWell-timed level is 5.1; no change today, will repeatProphylaxis and SurveillanceBK ND on 5/3/2022Transient CMV viremia, now cleared neg on 5/3Blood Pressure managementAdvised to check BP twice a day and bring in log with next visitPost-transplant DM:Followed by TXP pharmacy, overall improved with dietary modification and completion of steroid taper - not on any hypoglycemic agentsF/U in 1 monthPriyanka My Madariaga, MBBSTransplant Nephrology Fellow

## 2020-07-18 ENCOUNTER — Telehealth
Admit: 2020-07-18 | Payer: PRIVATE HEALTH INSURANCE | Attending: Pharmacist Clinician (PhC)/ Clinical Pharmacy Specialist | Primary: Family Medicine

## 2020-07-18 NOTE — Other
TRANSPLANT PHARMACY:  POST-TRANSPLANT HYPERGLYCEMIA FOLLOW-UPLuis A Harper has been referred for diabetes management.I left a voicemail for Adam Harper with Spanish Interpreter ID# [phone] to discuss blood sugars. Will follow up next week.Jerelyn Scott Parrish Bonn, PharmD5/08/2020

## 2020-07-23 ENCOUNTER — Encounter: Admit: 2020-07-23 | Payer: PRIVATE HEALTH INSURANCE | Primary: Family Medicine

## 2020-07-23 DIAGNOSIS — Z940 Kidney transplant status: Secondary | ICD-10-CM

## 2020-07-23 MED ORDER — AMOXICILLIN 500 MG CAPSULE
500 mg | ORAL_CAPSULE | Freq: Once | ORAL | 1 refills | Status: AC
Start: 2020-07-23 — End: ?

## 2020-07-23 NOTE — Telephone Encounter
LogEvent: [name] #: [id number] Sent: 07/23/2020 09:05 MTCaller Name: [name]: [phone] Ext: From: Post Kidney Message Type: General MessageMessage: Pt: Name: Adam Harper: 08-07-1962MD:You are seen at [facility] transplant in [place], correct? Yes Post:Organ Type: Kidney RE: Needs a letter saying he can have dental cleaning and a tooth removed.Please call when complete so he can pick up. States he also needs penicillin script for his upcoming dental appt.Spanish interpreter: [phone] Called to speak to pt with spanish interpreter. Advised that we will send amoxicillin 2g one time to [facility]. Educated on how to take medication. Advised this Rn would send letter through [employer]. Pt agreeable with this plan. Will follow up.

## 2020-07-24 ENCOUNTER — Encounter
Admit: 2020-07-24 | Payer: PRIVATE HEALTH INSURANCE | Attending: Pharmacist Clinician (PhC)/ Clinical Pharmacy Specialist | Primary: Family Medicine

## 2020-07-24 NOTE — Other
TRANSPLANT PHARMACY:  POST-TRANSPLANT HYPERGLYCEMIA FOLLOW-UPLuis A Harper has been referred for diabetes management.I spoke with Adam Harper regarding glycemic management post-transplant. Glucose levels have been stable compared to previous readings. Called patient using spanish interpreter [phone].The patient currently sees transplant pharmacy for their diabetes management. Patient is interested in seeing Dr. Dinah Beers.The patient tests his sugars: 1 time(s) a dayPatient is not using MyChart glucose flowsheet to document blood glucose.BG readings are: 05/09 05/10 05/11  05/12 Breakfast 141 126 133 141 Lunch     Dinner     Night     Adam Harper has not experienced hypoglycemic events (e.g. BG < 70 mg/dL or s/sx including but not limited to hunger, tremor, sweating, headache). I have counseled the patient on what to do in the setting of a hypoglycemic event.Patient's HbA1c are as follows:Lab Results Component Value Date  HGBA1C 7.1 (H) 06/05/2020  HGBA1C 5.7 (H) 11/05/2019  HGBA1C 5.5 10/03/2018  The patient is not due for a HbA1c to assess BG control.Current glucose management regimen is: no agents currentlyPer protocol, the following changes were made to his regimen: encouraged patient to consistently test 2 times a day.The patient does not require refills.Plan to follow-up in 2 weeks.Adam Harper, PharmD5/02/2021

## 2020-07-30 ENCOUNTER — Telehealth: Admit: 2020-07-30 | Payer: PRIVATE HEALTH INSURANCE | Primary: Family Medicine

## 2020-07-30 DIAGNOSIS — Z940 Kidney transplant status: Secondary | ICD-10-CM

## 2020-07-30 NOTE — Other
TRANSPLANT PHARMACY:  POST-TRANSPLANT HYPERGLYCEMIA FOLLOW-UPLuis A Harper has been referred for diabetes management.I attempted to speak with [name] regarding glycemic management post-transplant but was unable to reach him. Left voicemail requesting a call back using Spanish interpreter (ID [phone]). Plan to follow-up again in a week.[name], PharmD5/18/2022

## 2020-08-02 MED FILL — TACROLIMUS XR 1 MG TABLET,EXTENDED RELEASE 24 HR: 1 mg | ORAL | 45 days supply | Qty: 90 | Fill #0 | Status: CP

## 2020-08-02 MED FILL — TACROLIMUS XR 4 MG TABLET,EXTENDED RELEASE 24 HR: 4 mg | ORAL | 30 days supply | Qty: 60 | Fill #1 | Status: CP

## 2020-08-04 ENCOUNTER — Encounter
Admit: 2020-08-04 | Payer: PRIVATE HEALTH INSURANCE | Attending: Student in an Organized Health Care Education/Training Program | Primary: Family Medicine

## 2020-08-04 DIAGNOSIS — Z79899 Other long term (current) drug therapy: Secondary | ICD-10-CM

## 2020-08-04 MED ORDER — MYCOPHENOLATE MOFETIL 250 MG CAPSULE
250 mg | ORAL_CAPSULE | Freq: Two times a day (BID) | ORAL | 12 refills | 90.00 days | Status: AC
Start: 2020-08-04 — End: 2021-10-12
  Filled 2020-09-17: qty 240, 40d supply, fill #0

## 2020-08-05 ENCOUNTER — Inpatient Hospital Stay: Admit: 2020-08-05 | Discharge: 2020-08-05 | Payer: PRIVATE HEALTH INSURANCE | Primary: Family Medicine

## 2020-08-05 DIAGNOSIS — Z0000 Encounter for general adult medical examination without abnormal findings: Secondary | ICD-10-CM

## 2020-08-05 LAB — LIPID PANEL
BKR CHOLESTEROL/HDL RATIO: 3.8 (ref 0.0–5.0)
BKR CHOLESTEROL: 137 mg/dL
BKR HDL CHOLESTEROL: 36 mg/dL — ABNORMAL LOW (ref >=40)
BKR LDL CHOLESTEROL CALCULATED: 59 mg/dL
BKR TRIGLYCERIDES: 209 mg/dL — ABNORMAL HIGH

## 2020-08-06 LAB — HEMOGLOBIN A1C
BKR ESTIMATED AVERAGE GLUCOSE: 163 mg/dL
BKR HEMOGLOBIN A1C: 7.3 % — ABNORMAL HIGH (ref 4.0–5.6)

## 2020-08-08 ENCOUNTER — Telehealth: Admit: 2020-08-08 | Payer: PRIVATE HEALTH INSURANCE | Primary: Family Medicine

## 2020-08-08 DIAGNOSIS — Z940 Kidney transplant status: Secondary | ICD-10-CM

## 2020-08-08 NOTE — Other
TRANSPLANT PHARMACY:  POST-TRANSPLANT HYPERGLYCEMIA FOLLOW-UPLuis A Harper has been referred for diabetes management.I attempted to speak with [name] regarding glycemic management post-transplant but was unable to reach him. Left voicemail requesting a call back using Spanish interpreter (ID [phone]).Plan to follow-up in a week.[name], PharmD5/27/2022

## 2020-08-13 ENCOUNTER — Ambulatory Visit: Admit: 2020-08-13 | Payer: PRIVATE HEALTH INSURANCE | Attending: Colon & Rectal Surgery | Primary: Family Medicine

## 2020-08-13 ENCOUNTER — Encounter: Admit: 2020-08-13 | Payer: PRIVATE HEALTH INSURANCE | Attending: Colon & Rectal Surgery | Primary: Family Medicine

## 2020-08-13 DIAGNOSIS — E213 Hyperparathyroidism, unspecified: Secondary | ICD-10-CM

## 2020-08-13 DIAGNOSIS — K602 Anal fissure, unspecified: Secondary | ICD-10-CM

## 2020-08-13 DIAGNOSIS — I770 Arteriovenous fistula, acquired: Secondary | ICD-10-CM

## 2020-08-13 DIAGNOSIS — K219 Gastro-esophageal reflux disease without esophagitis: Secondary | ICD-10-CM

## 2020-08-13 DIAGNOSIS — T827XXA Infection and inflammatory reaction due to other cardiac and vascular devices, implants and grafts, initial encounter: Secondary | ICD-10-CM

## 2020-08-13 DIAGNOSIS — E7800 Pure hypercholesterolemia, unspecified: Secondary | ICD-10-CM

## 2020-08-13 DIAGNOSIS — G4733 Obstructive sleep apnea (adult) (pediatric): Secondary | ICD-10-CM

## 2020-08-13 DIAGNOSIS — N186 End stage renal disease: Secondary | ICD-10-CM

## 2020-08-13 DIAGNOSIS — I712 Thoracic aortic aneurysm (HC Code): Secondary | ICD-10-CM

## 2020-08-13 DIAGNOSIS — J101 Influenza due to other identified influenza virus with other respiratory manifestations: Secondary | ICD-10-CM

## 2020-08-13 DIAGNOSIS — K7460 Unspecified cirrhosis of liver: Secondary | ICD-10-CM

## 2020-08-13 DIAGNOSIS — N2581 Secondary hyperparathyroidism of renal origin: Secondary | ICD-10-CM

## 2020-08-13 DIAGNOSIS — D649 Anemia, unspecified: Secondary | ICD-10-CM

## 2020-08-13 DIAGNOSIS — I10 Essential (primary) hypertension: Secondary | ICD-10-CM

## 2020-08-13 MED FILL — MAGNESIUM OXIDE 400 MG (241.3 MG MAGNESIUM) TABLET: 400 mg (241.3 mg magnesium) | ORAL | 30 days supply | Qty: 60 | Fill #3 | Status: CP

## 2020-08-13 MED FILL — PREDNISONE 5 MG TABLET: 5 mg | ORAL | 30 days supply | Qty: 30 | Fill #2 | Status: CP

## 2020-08-13 MED FILL — ATORVASTATIN 40 MG TABLET: 40 mg | ORAL | 90 days supply | Qty: 90 | Fill #1 | Status: CP

## 2020-08-13 MED FILL — AMLODIPINE 10 MG TABLET: 10 mg | ORAL | 90 days supply | Qty: 90 | Fill #3 | Status: CP

## 2020-08-13 NOTE — Patient Instructions
Dieta rica en fibraHigh-Fiber DietLa fibra, tambi?n llamada fibra dietaria, es un tipo de carbohidrato que se encuentra en las frutas, las verduras, los cereales integrales y los frijoles. Una dieta rica en fibra puede tener muchos beneficios para la salud. El m?dico puede recomendar una dieta rica en fibra para ayudar a:Evitar el estre?imiento. La fibra puede hacer que defeque con m?s frecuencia.Disminuir el nivel de colesterol.Aliviar las hemorroides, la diverticulosis no complicada o el s?ndrome de colon irritable.Evitar comer en exceso como parte de un plan para bajar de peso.Evitar la enfermedad card?aca, la diabetes tipo 2 y ciertos c?nceres. ?En qu? consiste el plan?El consumo diario recomendado de fibra incluye lo siguiente:38 gramos para los hombres menores de 50 a?os.30 gramos para los [employer] de 50 a?os.25 gramos para las mujeres menores de 50 a?os.21 gramos para las [employer] de 50 a?os. Puede lograr el consumo diario recomendado de fibra si come una variedad de frutas, verduras, cereales y frijoles. El m?dico tambi?n puede recomendar un suplemento de fibra si no es posible obtener suficiente fibra a trav?s de Psychologist, forensic.?Qu? debo saber acerca de la dieta rica en fibra?La eficacia de los suplementos de [place] no ha sido estudiada ampliamente, de modo que es mejor obtener fibra directamente de los alimentos.Verifique siempre el contenido de fibra en la etiqueta de informaci?n nutricional de los alimentos preenvasados. Busque alimentos que contengan al menos 5 gramos de [place] por porci?n.Consulte a un nutricionista si tiene preguntas sobre algunos alimentos espec?ficos relacionados con su enfermedad, especialmente si estos alimentos no se mencionan a continuaci?n.Aumente el consumo diario de fibra en forma gradual. Aumentar demasiado r?pido el consumo de fibra dietaria puede provocar distensi?n abdominal, c?licos o gases.Beber abundante agua. El [place] a Geophysicist/field seismologist.?Qu? alimentos puedo comer?CerealesPanes integrales. Cereal multigrano. Avena. Arroz integral. Gypsy Decant. Trigo burgol. Mijo. Magdalenas de salvado. Palomitas de ma?z. Galletas de centeno.VerdurasBatatas. Espinaca. Col rizada. Alcachofas. Repollo. Br?coli. Guisantes. Zanahorias. [place].FrutasBayas. Peras. Manzanas. Naranjas. Aguacates. Ciruelas y pasas. Higos secos.Carnes y otras fuentes de prote?nasFrijoles blancos, colorados, pintos y porotos de soja. Guisantes secos. Lentejas. Frutos secos y semillas.L?cteosYogur fortificado con Research scientist (life sciences).BebidasLeche de soja fortificada con fibra. Jugo de naranja fortificado con Bjorn Loser.OtrosBarras de [place].Es posible que los productos que se enumeran m?s arriba no sean una lista completa de las bebidas o los alimentos recomendados. Comun?quese con el nutricionista para conocer m?s opciones.?Qu? alimentos no se recomiendan?CerealesPan blanco. Pastas hechas con Webb Laws. Arroz blanco.VerdurasPapas fritas. Verduras enlatadas. Verduras muy cocidas.FrutasJugo de frutas. Frutas cocidas coladas.Carnes y otras fuentes de prote?nasCortes de carne con alto contenido de Holiday representative. Aves o pescados fritos.L?cteosLeche. Yogur. Queso crema. Crema ?cida.BebidasGaseosas.OtrosTortas y pasteles. [place]equilla y aceites.Es posible que los productos que se enumeran m?s arriba no sean una lista completa de los alimentos y las bebidas que se Theatre stage manager. Comun?quese con el nutricionista para obtener m?s informaci?n.?Cu?les son algunos consejos para incluir alimentos ricos en fibra en la dieta?Consuma una gran variedad de alimentos ricos en fibra.Aseg?rese de que la mitad de todos los cereales consumidos cada d?a sean cereales integrales.Reemplace los panes y cereales hechos de harina refinada o harina blanca por panes y cereales integrales.Reemplace el arroz blanco por arroz integral, trigo burgol o mijo.Comience el d?a con un desayuno rico en fibra, como un cereal que contenga al menos 5 gramos de [place] por porci?n.Use guisantes en lugar de carne en las sopas, ensaladas o pastas.Coma refrigerios ricos en fibra, como frutos rojos, verduras crudas, frutos secos o palomitas de ma?z.Esta informaci?n no tiene Theme park manager el consejo del  m?dico. Aseg?rese de hacerle al m?dico cualquier pregunta que tenga.Document Released: 03/01/2005 Document Revised: 07/07/2016 Document Reviewed: 06/02/2015Elsevier Interactive Patient Education ? 2018 Elsevier Inc.   Instructions Dieta rica en fibraHigh-Fiber Diet

## 2020-08-13 NOTE — Progress Notes
Primary Problem: FissureSubjective: Adam Harper is a very pleasant 60 y.o. male who presents for follow-up of the above problem.  Initially seen 12/26/2019 where he was treated conservatively for a fissure.  He returns today for follow-up.  Approximately 1 month ago he noticed that his stool was more [place] constipated.  He had associated blood and pain with this.  He subsequently increased his fluid intake and his symptoms have resolved.  Currently he has no symptoms and overall feels well.Objective:Physical Exam:BP (!) 151/74 Comment: takes bp med at 10 am - Pulse 78  - Temp 97.5 ?F (36.4 ?C)  - Ht 5' 8 (1.727 m)  - Wt 124.3 kg  - SpO2 95%  - BMI 41.67 kg/m? -General:  Overall appears well in no distress.-Abdomen:  On inspection not distended, palpation soft and nontender.-Rectal:  On inspection perianal skin note tag or skin lesions, with gentle separation anteriorly and posteriorly no signs of a fissure.-Chaperone: Patient exam or treatment required medical chaperone. The sensitive parts of the examination were performed with chaperone present, Edwin Cap. 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

## 2020-08-14 ENCOUNTER — Ambulatory Visit: Admit: 2020-08-14 | Payer: PRIVATE HEALTH INSURANCE | Attending: Internal Medicine | Primary: Family Medicine

## 2020-08-15 ENCOUNTER — Telehealth: Admit: 2020-08-15 | Payer: PRIVATE HEALTH INSURANCE | Primary: Family Medicine

## 2020-08-15 NOTE — Other
TRANSPLANT PHARMACY:  POST-TRANSPLANT HYPERGLYCEMIA FOLLOW-UPLuis A Harper has been referred for diabetes management.Called and LVM for Adam Harper to follow-up on BG.Luberta Mutter, PharmD06/03/22 3:32 PM

## 2020-08-20 ENCOUNTER — Encounter: Admit: 2020-08-20 | Payer: PRIVATE HEALTH INSURANCE | Primary: Family Medicine

## 2020-08-20 NOTE — Other
TRANSPLANT PHARMACY:  POST-TRANSPLANT HYPERGLYCEMIA FOLLOW-UPLuis A Harper has been referred for diabetes management.I spoke with Adam Harper regarding glycemic management post-transplant. Glucose levels have been stable compared to previous readings.The patient currently sees Transplant Pharmacy for their diabetes management. The patient tests his sugars: 1 time(s) a dayPatient is not using MyChart glucose flowsheet to document blood glucose.BG readings are:  6/4 6/5 6/6 6/7 6/8 Breakfast 115 147  147  Lunch      Dinner      Night      Unable to produce more sugars as they were not synched to his phone. 6/7 sugar reported from memory*Adam Harper has not experienced hypoglycemic events (e.g. BG < 70 mg/dL or s/sx including but not limited to hunger, tremor, sweating, headache). I have counseled the patient on what to do in the setting of a hypoglycemic event.Patient's HbA1c are as follows:Lab Results Component Value Date  HGBA1C 7.3 (H) 08/05/2020  HGBA1C 7.1 (H) 06/05/2020  HGBA1C 5.7 (H) 11/05/2019  The patient is not due for a HbA1c to assess BG control.Current glucose management regimen is: nonePer protocol, the following changes were made to his regimen: noneThe patient does not require refills..  Plan to follow-up in clinic on 6/14 after appointment with Dr. Phylliss Blakes. Patient instructed to bring blood sugar meeting to visit to discuss.Luberta Mutter, PharmD6/10/2020

## 2020-08-21 MED FILL — CARVEDILOL IMMEDIATE RELEASE 25 MG TABLET: 25 mg | ORAL | 30 days supply | Qty: 60 | Fill #9 | Status: CP

## 2020-08-21 MED FILL — ATOVAQUONE 750 MG/5 ML ORAL SUSPENSION: 750 mg/5 mL | ORAL | 30 days supply | Qty: 300 | Fill #2 | Status: CP

## 2020-08-26 ENCOUNTER — Inpatient Hospital Stay: Admit: 2020-08-26 | Discharge: 2020-08-26 | Payer: PRIVATE HEALTH INSURANCE | Primary: Family Medicine

## 2020-08-26 ENCOUNTER — Ambulatory Visit: Admit: 2020-08-26 | Payer: PRIVATE HEALTH INSURANCE | Attending: Nephrology | Primary: Family Medicine

## 2020-08-26 ENCOUNTER — Ambulatory Visit: Admit: 2020-08-26 | Payer: PRIVATE HEALTH INSURANCE | Primary: Family Medicine

## 2020-08-26 ENCOUNTER — Encounter: Admit: 2020-08-26 | Payer: PRIVATE HEALTH INSURANCE | Primary: Family Medicine

## 2020-08-26 DIAGNOSIS — I10 Essential (primary) hypertension: Secondary | ICD-10-CM

## 2020-08-26 DIAGNOSIS — Z940 Kidney transplant status: Secondary | ICD-10-CM

## 2020-08-26 DIAGNOSIS — Z79899 Other long term (current) drug therapy: Secondary | ICD-10-CM

## 2020-08-26 DIAGNOSIS — E119 Type 2 diabetes mellitus without complications: Secondary | ICD-10-CM

## 2020-08-26 LAB — CBC WITH AUTO DIFFERENTIAL
BKR WAM ABSOLUTE IMMATURE GRANULOCYTES.: 0.02 x 1000/??L (ref 0.00–0.30)
BKR WAM ABSOLUTE LYMPHOCYTE COUNT.: 0.81 x 1000/??L (ref 0.60–3.70)
BKR WAM ABSOLUTE NRBC (2 DEC): 0 x 1000/??L (ref 0.00–1.00)
BKR WAM ANALYZER ANC: 3.71 x 1000/??L (ref 2.00–7.60)
BKR WAM BASOPHIL ABSOLUTE COUNT.: 0.04 x 1000/??L (ref 0.00–1.00)
BKR WAM BASOPHILS: 0.8 % (ref 0.0–1.4)
BKR WAM EOSINOPHIL ABSOLUTE COUNT.: 0.06 x 1000/??L (ref 0.00–1.00)
BKR WAM EOSINOPHILS: 1.2 % (ref 0.0–5.0)
BKR WAM HEMATOCRIT (2 DEC): 41.5 % — ABNORMAL HIGH (ref 38.50–50.00)
BKR WAM HEMOGLOBIN: 14.2 g/dL (ref 13.2–17.1)
BKR WAM IMMATURE GRANULOCYTES: 0.4 % (ref 0.0–1.0)
BKR WAM LYMPHOCYTES: 15.7 % — ABNORMAL LOW (ref 17.0–50.0)
BKR WAM MCH (PG): 33.1 pg — ABNORMAL HIGH (ref 27.0–33.0)
BKR WAM MCHC: 34.2 g/dL (ref 31.0–36.0)
BKR WAM MCV: 96.7 fL (ref 80.0–100.0)
BKR WAM MONOCYTE ABSOLUTE COUNT.: 0.51 x 1000/??L (ref 0.00–1.00)
BKR WAM MONOCYTES: 9.9 % (ref 4.0–12.0)
BKR WAM MPV: 10.3 fL (ref >60)
BKR WAM NEUTROPHILS: 72 % (ref 39.0–72.0)
BKR WAM NUCLEATED RED BLOOD CELLS: 0 % (ref 0.0–1.0)
BKR WAM PLATELETS: 200 x1000/??L (ref >60)
BKR WAM RDW-CV: 13.2 % — ABNORMAL LOW (ref 11.0–15.0)
BKR WAM RED BLOOD CELL COUNT.: 4.29 M/ÂµL (ref 4.00–6.00)
BKR WAM WHITE BLOOD CELL COUNT: 5.2 x1000/??L (ref 4.0–11.0)

## 2020-08-26 LAB — MAGNESIUM: BKR MAGNESIUM: 1.6 mg/dL — ABNORMAL LOW (ref 1.7–2.4)

## 2020-08-26 LAB — ALBUMIN/CREATININE PANEL, URINE, RANDOM
BKR ALBUMIN, URINE, RANDOM: 130.2 mg/L
BKR ALBUMIN/CREATININE RATIO, URINE, RANDOM: 101.9 mg/g{creat} — ABNORMAL HIGH (ref <30.0)
BKR CREATININE, URINE, RANDOM: 128 mg/dL

## 2020-08-26 LAB — BASIC METABOLIC PANEL
BKR ANION GAP: 15 g/dL (ref 7–17)
BKR BLOOD UREA NITROGEN: 14 mg/dL (ref 6–20)
BKR BUN / CREAT RATIO: 7.3 — ABNORMAL LOW (ref 8.0–23.0)
BKR CALCIUM: 8.9 mg/dL (ref 8.8–10.2)
BKR CHLORIDE: 99 mmol/L (ref 98–107)
BKR CO2: 27 mmol/L (ref 20–30)
BKR CREATININE: 1.92 mg/dL — ABNORMAL HIGH (ref 0.40–1.30)
BKR EGFR (AFR AMER): 44 mL/min/{1.73_m2} (ref >60)
BKR EGFR (NON AFRICAN AMERICAN): 36 mL/min/{1.73_m2} (ref >60)
BKR GLUCOSE: 109 mg/dL — ABNORMAL HIGH (ref 70–100)
BKR POTASSIUM: 3.4 mmol/L (ref 3.3–5.3)
BKR SODIUM: 141 mmol/L (ref 136–144)

## 2020-08-26 LAB — PROTEIN, TOTAL W/CREATININE, URINE, RANDOM     (BH GH LMW YH)
BKR CREATININE, URINE, RANDOM: 128 mg/dL
BKR PROTEIN URINE RANDOM: 0.26 g/L
BKR PROTEIN/CREATININE RATIO, URINE, RANDOM: 0.2 mg/mg Cr — ABNORMAL HIGH (ref 9–<0.10)

## 2020-08-26 LAB — PHOSPHORUS     (BH GH L LMW YH): BKR PHOSPHORUS: 4.4 mg/dL (ref 2.2–4.5)

## 2020-08-26 LAB — TACROLIMUS LEVEL     (BH GH L LMW YH): BKR TACROLIMUS BLOOD: 10.7 ng/mL

## 2020-08-26 MED ORDER — FUROSEMIDE 40 MG TABLET
40 mg | ORAL_TABLET | Freq: Every day | ORAL | 5 refills | Status: AC
Start: 2020-08-26 — End: 2020-09-26

## 2020-08-26 NOTE — Progress Notes
Labs reviewed with transplant nephrologist/surgeon Dr. Phylliss Blakes, no changes were made.Electronically Signed by Mikle Bosworth, RN, August 26, 2020

## 2020-08-26 NOTE — Progress Notes
Medical Follow-up Visit: Kidney/Pancreas Transplant ProgramChief complaint:  Adam Harper returns for follow up of:	Kidney transplant function assessment	Immuno drug therapy requiring intensive monitoring for efficacy and toxicity	Blood pressure managementImportant Transplant History:  ?	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	CMV D + / R + ?	Donor Toxo positive ?	CPRA zero, Alemtuzumab ?	9/4-9/7: admit for hyperK+, urgent HD. Bactrim switched to Atovaquone?	Intermittent pre-syncopal events, holter monitoring.?	Stent removal 9/15/2021Last updated : 12/13/19 , Stanford Scotland, MD History of present illness: Adam Harper does not report symptoms referable to his transplanted kidney.He  reports compliance with his immunosuppressive medications and does not report drug related side effects.He denies specifically, fevers, diarrhea, dysuria, urinary urgency and pain over he allograft.1. No kidney complaints.2. He is seeing sleep medicine for OSA3. Today is an accurate 24 hour trough4. Reports increasing weight and has some edema5 home BP 130-145 mmHg ________PFSH reviewed and confirmed 6/14/2022Review of Systems In addition he reportsReview of Systems Constitutional: Negative for chills, fever, malaise/fatigue and weight loss. Respiratory: Negative for shortness of breath.  Cardiovascular: Negative for leg swelling. Gastrointestinal: Negative for diarrhea and nausea. Genitourinary: Negative for dysuria, frequency and urgency. Musculoskeletal: Positive for back pain. Negative for myalgias.      Back pain is musculoskeletal in nature. Neurological: Negative for tremors. Psychiatric/Behavioral: Negative for depression and memory loss. All other systems reviewed and are negative.Physical Exam  In general Adam Harper looks well and is in no distress.  he is alert, oriented and fully conversant. Vitals: BP (!) 150/75 (Site: l a, Position: Sitting, Cuff Size: Medium) Comment: reported to MD - Pulse 70  - Temp 97.5 ?F (36.4 ?C) (Temporal)  - Wt 125.6 kg  - SpO2 94%  - BMI 42.12 kg/m?  Wt Readings from Last 4 Encounters: 08/26/20 125.6 kg 08/13/20 124.3 kg 08/05/20 124.3 kg 07/17/20 122.6 kg   Physical ExamVitals reviewed. Constitutional:     Appearance: He is well-developed. Eyes:    General: No scleral icterus.   Pupils: Pupils are equal, round, and reactive to light. Pulmonary:    Effort: Pulmonary effort is normal. Abdominal:    Palpations: Abdomen is soft.    Comments: Renal allograft nontender Musculoskeletal:       General: Swelling present. Skin:   General: Skin is warm and dry. Neurological:    Mental Status: He is alert and oriented to person, place, and time. Psychiatric:       Behavior: Behavior normal. Assessment / Plan Transplanted Allograft The most recent serum creatinine is Lab Results Component Value Date  CREATININE 1.95 (H) 07/15/2020 Recent albumin/creatinine ratio is Lab Results Component Value Date  ALBCREATR 149.4 (H) 07/15/2020  and recent protein/creatinine ratio is Lab Results Component Value Date  PRCRRAU 0.29 (H) 07/15/2020 .Immunosuppression Management The the most recent tacrolimus level for today's visit is Lab Results Component Value Date  TACROLIMUS 5.1 07/15/2020 Immunosuppression     Start End  mycophenolate mofetil (CELLCEPT) 250 mg capsule 08/04/2020   Sig - Route: Take 3 capsules (750 mg total) by mouth 2 (two) times daily. - Oral  Notes to Pharmacy: Kidney Transplant Z94.0.  predniSONE (DELTASONE) 5 mg tablet 05/21/2020   Sig - Route: Take 1 tablet (5 mg total) by mouth daily. Take after completion of taper. - Oral  Notes to Pharmacy: Kidney Transplant Z94.0  tacrolimus XR (ENVARSUS XR) 1 mg 24 hr tablet 06/21/2020   Sig - Route: Take 2 tablets (2 mg total) by mouth daily. (Take 2 tablets by mouth daily along with 2 tablets of the  4 mg dose to make a total of 10 mg.) - Oral  Notes to Pharmacy: z94.0  tacrolimus XR (ENVARSUS XR) 4 mg 24 hr tablet 06/21/2020   Sig - Route: Take 2 tablets (8 mg total) by mouth daily. (Take 2 tablets by mouth daily along with 2 tablets of 1 mg to make a total of 10 mg). - Oral  Blood Pressure ManagementToday's blood pressure is BP Readings from Last 2 Encounters: 08/26/20 (!) 150/75 08/13/20 (!) 151/74 . No change to current regimen.Antihypertensives     Start End  amLODIPine (NORVASC) 10 mg tablet 11/12/2019 11/12/2020  Sig - Route: Take 1 tablet (10 mg total) by mouth every morning. - Oral  carvediloL (COREG) 25 mg Immediate Release tablet 11/12/2019 11/11/2020  Sig - Route: Take 1 tablet (25 mg total) by mouth 2 (two) times daily with breakfast and dinner. - Oral  chlorthalidone (HYGROTEN) 50 mg tablet 01/17/2020 01/16/2021  Sig - Route: Take 1 tablet (50 mg total) by mouth daily. - Oral  Cosign for Ordering: Accepted by Nolon Lennert, MD on 01/17/2020  7:39 PM  Prophylaxis Current prophylactic Medications [id number] Prophylaxis       atovaquone (MEPRON) 750 mg/5 mL suspension Take 10 mLs (1,500 mg total) by mouth daily with dinner.  Number of times this order has been changed since signing: 8   Order Audit Trail   lidocaine maalox diphenhydramine nystatin (MAGIC MOUTHWASH + NYSTATIN) 1:1:1:1 Swish and swallow 10 mLs 4 (four) times daily.  Number of times this order has been changed since signing: 2   Order Audit Trail     HematologyMost recent WBC is Lab Results Component Value Date  WBC 5.2 08/26/2020  and most recent hemoglobin is Lab Results Component Value Date  HGB 14.2 08/26/2020 .  No active issues.Viral monitoring Adam Harper is Lab Results Component Value Date  CYTOMEGALOVI Positive (A) 10/02/2019 Most recent BK viral testing -- BK Virus Quantitative PCR (no units) Date Value 07/15/2020 Not Detected Most recent CMV viral testing -- Cytomegalovirus Quantitative PCR (no units) Date Value 07/15/2020 Not Detected ElectrolytesMost recent magnesium is Lab Results Component Value Date  MG 1.6 (L) 07/15/2020 , most recent calcium is  Lab Results Component Value Date  CALCIUM 9.1 07/15/2020  and most recent phosphorus is Lab Results Component Value Date  PHOS 4.2 07/15/2020 .Summary of Plan:1. Follow up on labs.2. Lasix 40 mg po qd for 1 weeks to address edema.  Will weight himself daily3. No change to BP rx----Electronically Signed by Alvira Philips, MD, August 26, 2020

## 2020-08-27 ENCOUNTER — Encounter: Admit: 2020-08-27 | Payer: PRIVATE HEALTH INSURANCE | Attending: Cardiovascular Disease | Primary: Family Medicine

## 2020-08-27 ENCOUNTER — Ambulatory Visit: Admit: 2020-08-27 | Payer: PRIVATE HEALTH INSURANCE | Attending: Cardiovascular Disease | Primary: Family Medicine

## 2020-08-27 DIAGNOSIS — Z992 Dependence on renal dialysis: Secondary | ICD-10-CM

## 2020-08-27 DIAGNOSIS — J101 Influenza due to other identified influenza virus with other respiratory manifestations: Secondary | ICD-10-CM

## 2020-08-27 DIAGNOSIS — N186 End stage renal disease: Secondary | ICD-10-CM

## 2020-08-27 DIAGNOSIS — K7460 Unspecified cirrhosis of liver: Secondary | ICD-10-CM

## 2020-08-27 DIAGNOSIS — E7800 Pure hypercholesterolemia, unspecified: Secondary | ICD-10-CM

## 2020-08-27 DIAGNOSIS — K219 Gastro-esophageal reflux disease without esophagitis: Secondary | ICD-10-CM

## 2020-08-27 DIAGNOSIS — E213 Hyperparathyroidism, unspecified: Secondary | ICD-10-CM

## 2020-08-27 DIAGNOSIS — D649 Anemia, unspecified: Secondary | ICD-10-CM

## 2020-08-27 DIAGNOSIS — T827XXA Infection and inflammatory reaction due to other cardiac and vascular devices, implants and grafts, initial encounter: Secondary | ICD-10-CM

## 2020-08-27 DIAGNOSIS — N2581 Secondary hyperparathyroidism of renal origin: Secondary | ICD-10-CM

## 2020-08-27 DIAGNOSIS — I712 Thoracic aortic aneurysm (HC Code): Secondary | ICD-10-CM

## 2020-08-27 DIAGNOSIS — I10 Essential (primary) hypertension: Secondary | ICD-10-CM

## 2020-08-27 DIAGNOSIS — G4733 Obstructive sleep apnea (adult) (pediatric): Secondary | ICD-10-CM

## 2020-08-27 DIAGNOSIS — E785 Hyperlipidemia, unspecified: Secondary | ICD-10-CM

## 2020-08-27 DIAGNOSIS — I770 Arteriovenous fistula, acquired: Secondary | ICD-10-CM

## 2020-08-27 DIAGNOSIS — I447 Left bundle-branch block, unspecified: Secondary | ICD-10-CM

## 2020-08-27 LAB — CYTOMEGALOVIRUS QUANTITATIVE PCR, PLASMA     (BH GH L LMW YH): BKR CMV QUANTITATIVE PCR: NOT DETECTED

## 2020-08-27 NOTE — Progress Notes
Patient ID: [id number] Patient Name: Adam Harper of Birth: Oct 28, 1962Date of Service: 6/15/2022HISTORY OF PRESENT ILLNESS:  Patient is a 60 y.o. 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 A perfusion imaging stress test was obtained on January 02, 2015. This revealed a small mild basal to mid inferolateral fixed defect without evidence of ischemia. There was moderate LAD, circumflex and right coronary artery calcification. Aortic dimensions measured 4.1 cm at the proximal aortic root.?Echocardiogram obtained on August 24, 2016 revealed an ejection fraction greater than 70% without evidence of significant LVOT obstruction, mild mitral and tricuspid insufficiency, aortic sclerosis, maximal aortic dimensions of 4.1 centimeters,, mild left atrial enlargement, estimated RV systolic pressure of 25 millimeters of mercury.  Moderate concentric LVH.?[facility] Chest completed on 05/25/2018 showed unchanged dilated ascending aorta to 4.1 cm. Severe three-vessel coronary arterial calcification. Indeterminant left renal nodular densities may represent hemorrhagic/proteinaceous cyst given. Recommend correlation with today's ultrasound. Further characterization can be performed with [place] or MRI renal mass protocol if warranted. 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 Dedicated thymic MRI can be performed for further characterization as clinically indicated.?US Abdominal Aorta completed on 05/25/2018 showed the proximal abdominal aorta measures 2.6 x 2.4 cm in size. The mid abdominal aorta measures 2.2 x 1.9 cm in size. No evidence for abdominal aortic aneurysm.?Echocardiogram obtained November 10, 2018. Mildly increased left ventricular cavity size. Normal left ventricular systolic function. Severe concentric left ventricular hypertrophy. LVEF calculated by 3DE was 68%. Normal right ventricular cavity size and systolic function. Right ventricular systolic pressure is unable to be estimated due to insufficient Doppler signal. Left atrium is moderately dilated. Mild aortic valve thickening. No aortic stenosis. Trace aortic regurgitation. Dilated sinuses of Valsalva with a diameter of 4.4 cm and dilated ascending aorta with a diameter of 4.2 cm. No evidence of pericardial effusion. Compared to the study from 08/24/16, the left ventricular hypertrophy is now severe.An Echo 2D Complete w Doppler and CFI if Ind Image Enhancement 3D and or bubbles was obtained on October 30, 2019. Normal left ventricular size, systolic function and wall motion. Moderate concentric left ventricular hypertrophy.  LVEF calculated by biplane Simpson's was 57%.  Abnormal tissue Doppler suggestive of abnormal diastolic function. Normal right ventricular cavity size and systolic function.  Right ventricular systolic pressure is unable to be estimated due to insufficient Doppler signal.Atria are normal in size. No significant valvular abnormalities. All visible segments of the aorta are normal in size. IVC diameter < 2.1 cm that collapses > 50% with a sniff suggests normal RAP (0-5 mmHg, mean 3 mmHg). No evidence of pericardial effusion. Compared with the prior study, dated 11/10/2018, the measurements of the left ventricular wall thickness, the left atrium, and the ascending aorta were previously abnormal. Technical differences in image acquisition may account for at least some of this apparent change.?Adam Harper is presenting on 08/27/2020 for a follow-up cardiac followup visit. 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 He states that he gets fatigued often. He has gained weight despite a decreased appetite. He has not been active with exercise lately, this is due to his back pain.  Lasix 40 milligrams daily was added for 1 week at his recent renal transplant visit in the context of edema and weight gain.Patient has not been using CPAP for his sleep apnea.  He denies lightheadedness, dizziness, near-syncope or syncope.  He denies shortness of breath, orthopnea,  PND, lower extremity edema.  No melena or bleeding. He denies any fever, chills, night sweats, or claudication symptoms.PAST MEDICAL HISTORY:Past Medical History: Diagnosis Date ? A-V fistula (HC Code) (HC CODE) (HC Code)   right arm ? Anemia  ? 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 Overview:  Added automatically from request for surgery [id number] Overview:  Added automatically from request for surgery [id number] ? Anemia in ESRD (end-stage renal disease) (HC Code) 10/24/2014  Added automatically from request for surgery [id number]  Added automatically from request for surgery [id number] Overview:  Added automatically from request for surgery [id number] ? AV fistula infection (HC Code) (HC CODE) (HC Code)  ? Cirrhosis (HC Code) (HC CODE) (HC Code)  ? ESRD on hemodialysis (HC Code) (HC CODE) (HC Code)   Mon-Wed-Fri ? GERD (gastroesophageal reflux disease)  ? Hypercholesteremia  ? Hyperparathyroidism (HC Code) (HC CODE) (HC Code)  ? Hypertension  ? Infected prosthetic vascular graft, initial encounter (HC Code) (HC CODE) (HC Code) 10/24/2014 ? Influenza A  ? Morbid obesity (HC Code) (HC CODE) (HC Code) 02/03/2018 ? Obstructive sleep apnea   pt uses CPAP ? Secondary hyperparathyroidism of renal origin (HC Code) (HC CODE) (HC Code)  ? Thoracic aortic aneurysm (HC Code)  Past Surgical History: Procedure Laterality Date ? AV FISTULA PLACEMENT Left   left lower arm unsuccessful, then moved to upper arm 2011, then had problems in 10/2013 - infection Social History Tobacco Use Smoking Status Former Smoker ? Packs/day: 0.25 ? Years: 25.00 ? Pack years: 6.25 ? Types: Cigarettes Smokeless Tobacco Never Used CURRENT MEDICATION LIST:Current Outpatient Medications Medication Sig Dispense Refill ? amLODIPine (NORVASC) 10 mg tablet Take 1 tablet (10 mg total) by mouth every morning. 90 tablet 3 ? atorvastatin (LIPITOR) 40 mg tablet Take 1 tablet (40 mg total) by mouth daily. 90 tablet 3 ? atovaquone (MEPRON) 750 mg/5 mL suspension Take 10 mLs (1,500 mg total) by mouth daily with dinner. 300 mL 11 ? 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 Please dispense glucometer covered by patient's insurance. 1 each 0 ? 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 Please dispense supplies covered by patient's insurance. 100 strip 11 ? carvediloL (COREG) 25 mg Immediate Release tablet Take 1 tablet (25 mg total) by mouth 2 (two) times daily with breakfast and dinner. 60 tablet 11 ? chlorthalidone (HYGROTEN) 50 mg tablet Take 1 tablet (50 mg total) by mouth daily. 90 tablet 3 ? ergocalciferol (VITAMIN D2) 1,250 mcg (50,000 unit) capsule Take 1 capsule (50,000 Units total) by mouth once a week. 12 capsule 0 ? furosemide (LASIX) 40 mg tablet Take 1 tablet (40 mg total) by mouth daily. 30 tablet 4 ? 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 Please dispense supplies covered by patient's insurance. 100 each 11 ? lidocaine maalox diphenhydramine nystatin (MAGIC MOUTHWASH + NYSTATIN) 1:1:1:1 Swish and swallow 10 mLs 4 (four) times daily. (Patient taking differently: Swish and swallow 10 mLs as needed.) 500 mL 0 ? magnesium oxide (MAG-OX) 400 mg (241.3 mg magnesium) tablet Take 1 tablet (400 mg total) by mouth 2 (two) times daily. 60 tablet 3 ? mycophenolate mofetil (CELLCEPT) 250 mg capsule Take 3 capsules (750 mg total) by mouth 2 (two) times daily. 240 capsule 11 ? omeprazole (PRILOSEC) 20 mg  capsule Take 1 capsule (20 mg total) by mouth daily. 90 capsule 3 ? predniSONE (DELTASONE) 5 mg tablet Take 1 tablet (5 mg total) by mouth daily. Take after completion of taper. 30 tablet 11 ? tacrolimus XR (ENVARSUS XR) 1 mg 24 hr tablet Take 2 tablets (2 mg total) by mouth daily. (Take 2 tablets by mouth daily along with 2 tablets of the 4 mg dose to make a total of 10 mg.) 90 tablet 11 ? tacrolimus XR (ENVARSUS XR) 4 mg 24 hr tablet Take 2 tablets (8 mg total) by mouth daily. (Take 2 tablets by mouth daily along with 2 tablets of 1 mg to make a total of 10 mg). 90 tablet 11 ? diclofenac (VOLTAREN) 1 % gel Apply topically 4 (four) times daily. (Patient not taking: Reported on 08/27/2020) 100 g 2 No current facility-administered medications for this visit. CURRENT ALLERGY LIST:No Known AllergiesREVIEW OF SYSTEMS:Remaining 12-point review of systems unremarkable with the exception of history of present illness. This was repeated by me today, August 27, 2020.	VITALS:BP 122/86 (Site: l a, Position: Sitting, Cuff Size: Large)  - Pulse 73  - Ht 5' 9 (1.753 m)  - Wt 125.9 kg  - SpO2 95%  - BMI 40.99 kg/m?  By MD, blood pressure is 138/64 in the left armPHYSICAL EXAM:	CONSTITUTIONAL:             GENERAL APPEARANCE:  Patient is male.  Alert, well developed, well nourished.  In no acute distress.     HEAD/FACE: The head is normocephalic.     NECK AND THYROID: No bruits noted in the neck.  Difficult to assess JVD in the context of very thick neck.  No thyroid enlargement.     RESPIRATORY:  No wheezes, rales, or rhonchi. Clear to auscultation.     CARDIOVASCULAR:              PALPATION & AUSCULTATION: S1 S2.  Regular rate with An audible S4 as well as a holosystolic murmur at the cardiac apex.             ARTERIAL PULSES: Normal carotid pulses with no bruits.  No palpable enlargement or bruit of the abdominal aorta.  No renal bruit.  Normal femoral pulses without bruits or enlargements.  Normal radial pulses.  Normal pedal pulses and capillary refill.             EDEMA/VARISCOSITIES OF EXTREMITIES: trace/mild edema.     GASTROINTESTINAL:              ABDOMEN: Soft, non-tender, non distend. Normal bowel signs. No masses appreciated.             LIVER/KIDNEY/SPLEEN: No hepatosplenomegaly.     PSYCHIATRIC: Awake and alert.  Oriented to person, place, time and general circumstances.  Mood and affect appropriate.TEST RESULTS:  Chemistry    Component Value Date/Time  NA 141 08/26/2020 0930  K 3.4 08/26/2020 0930  CL 99 08/26/2020 0930  CO2 27 08/26/2020 0930  BUN 14 08/26/2020 0930  CREATININE 1.92 (H) 08/26/2020 0930  GLU 109 (H) 08/26/2020 0930  PROT 6.9 11/18/2019 0741    Component Value Date/Time  CALCIUM 8.9 08/26/2020 0930  ALKPHOS 65 11/18/2019 0741  AST 15 11/18/2019 0741  AST 15 01/23/2013 1534  ALT 16 11/18/2019 0741  ALT 16 01/23/2013 1534  BILITOT 0.6 11/18/2019 0741  ALBUMIN 3.9 11/18/2019 0741  Lab Results Component Value Date  WBC 5.2 08/26/2020  WBC 6.1 07/15/2020  WBC 8.1 06/05/2020  HGB 14.2  08/26/2020  HGB 13.9 07/15/2020  HGB 13.8 06/05/2020  HCT 41.50 08/26/2020  HCT 41.10 07/15/2020  HCT 41.80 06/05/2020  MCV 96.7 08/26/2020  MCV 95.6 07/15/2020  MCV 97.7 06/05/2020  PLT 200 08/26/2020  PLT 205 07/15/2020  PLT 202 06/05/2020  HGBA1C 7.3 (H) 08/05/2020  HGBA1C 7.1 (H) 06/05/2020  HGBA1C 5.7 (H) 11/05/2019  CHOL 137 08/05/2020  CHOL 104 10/03/2018  CHOL 145 09/29/2018  CHOLHDL 3.8 08/05/2020  CHOLHDL 4.3 10/03/2018  CHOLHDL 4.4 09/29/2018  HDL 36 (L) 08/05/2020  HDL 24 (L) 10/03/2018  HDL 33 (L) 09/29/2018  LDL 59 08/05/2020  LDL 43 16/12/9602  LDL 76 54/11/8117  TRIG 209 (H) 08/05/2020  TRIG 185 (H) 10/03/2018  TRIG 182 (H) 09/29/2018  ALT 16 11/18/2019  ALT 20 11/17/2019  ALT 18 11/05/2019  AST 15 11/18/2019  AST 17 11/17/2019  AST 17 11/05/2019 Lab Results Component Value Date CREATININE 1.92 (H) 08/26/2020 Lab Results Component Value Date  ALT 16 11/18/2019  AST 15 11/18/2019  ALKPHOS 65 11/18/2019  BILITOT 0.6 11/18/2019 Blood work obtained on August 26, 2020 revealed hemoglobin 14.2, hematocrit 41.50, sodium 141, potassium 3.4, glucose 109, BUN 14, and creatinine 1.92.  Last lipid profile from Aug 05, 2020 revealed LDL 59, HDL 36, triglycerides of 209. 24 hour Holter monitor from December 11, 2019 revealed sinus rhythm with an average heart rate of 71, 501 PVCs, 100 APCs, brief SVT with no nonsustained ventricular tachycardia.An EKG was obtained in the office today.  Sinus rhythm rate of 73, intraventricular conduction delay/incomplete left bundle branch block, LVH with strain pattern, T-wave inversions in 1, aVL as well as leads V4 through V6.  Similar to previous EKGs.ASSESSMENT/PLAN:Hypertension: His blood pressure is acceptable today in office. He will continue to monitor BP. He denies any lightheadedness, dizziness, or headaches. His current antihypertensive regimen includes carvedilol 25 mg b.i.d., amlodipine 10 mg daily, and chlorthalidone 50 milligrams daily.   Lasix 40 milligrams daily was added yesterday.  He has not started this yet.  He is to take this for 1 week and follow up with renal transplant team.  I agree with increased diuretic therapy at this time.  I will obtain an echocardiogram given his increased dyspnea and mild edema?Mildly dilated aortic root: Dilated sinuses of Valsalva with a diameter of 4.1 cm and dilated ascending aorta with a diameter of 4.0 cm on echocardiogram from June of 2018. Most recent [facility] Chest completed on 05/25/2018 showed unchanged dilated ascending aorta to 4.1 cm. Severe three-vessel coronary arterial calcification. Indeterminant left renal nodular densities may represent hemorrhagic/proteinaceous cyst given. Recommend correlation with today's ultrasound. Further characterization can be performed with [place] or MRI renal mass protocol if warranted. 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 Dedicated thymic MRI can be performed for further characterization as clinically indicated. US Abdominal Aorta completed on 05/25/2018 showed the proximal abdominal aorta measures 2.6 x 2.4 cm in size. The mid abdominal aorta measures 2.2 x 1.9 cm in size. No evidence for abdominal aortic aneurysm. I have recommended ongoing blood pressure control.  I will discuss further increases in his antihypertensive regimen with his nephrology team.  I will defer further follow-up of his mediastinal nodule to his primary care team.  Most recent echocardiography from November 10, 2018 revealed maximal aortic dimensions of 4.4 centimeters at the sinuses of Valsalva, 4.2 centimeters at the mid ascending aorta.  This represents an increase from his previous study in 2018. At that time the sinuses  of Valsalva measured 4.1 centimeters in the ascending aorta measured 4 centimeters.  I will obtain a follow-up echocardiogram before his next follow-up in 6 months.  I would continue to advocate aggressive blood pressure control.  I will?Dyslipidemia: Most recent lipid profile is at goal. He is tolerating Lipitor 80 mg daily.?I will plan to see him in follow up in 6 months. He will contact me with any changes or questions. An echocardiogram and a [place] of the chest were ordered during this visit. Scribed for Dr. Dell Ponto, Trina Ao, MD by Riley Kill, medical scribe. Electronically Signed by Riley Kill, June 15, 2022The documentation recorded by the scribe accurately reflects the service I personally performed and the decisions made by me.Electronically Signed by Josetta Huddle, MD, August 27, 2020

## 2020-08-27 NOTE — Patient Instructions
Start furosmide 40 mg daily as prescribed, [place] scna of aorta, echocardiogram, use cpap

## 2020-08-29 ENCOUNTER — Telehealth: Admit: 2020-08-29 | Payer: PRIVATE HEALTH INSURANCE | Attending: Gastroenterology | Primary: Family Medicine

## 2020-08-29 NOTE — Other
TRANSPLANT PHARMACY:  POST-TRANSPLANT HYPERGLYCEMIA FOLLOW-UPLuis A Harper has been referred for diabetes management.I attempted to speak with Adam Harper (Spanish Interpreter Sherilyn Cooter ID#: [phone]) regarding glycemic management post-transplant. Patient did not answer and left a voicemail requesting return call.Rosalene Billings, PharmD6/17/2022

## 2020-09-04 ENCOUNTER — Encounter
Admit: 2020-09-04 | Payer: PRIVATE HEALTH INSURANCE | Attending: Pharmacist Clinician (PhC)/ Clinical Pharmacy Specialist | Primary: Family Medicine

## 2020-09-04 NOTE — Other
TRANSPLANT PHARMACY:  POST-TRANSPLANT HYPERGLYCEMIA FOLLOW-UPLuis A Harper has been referred for diabetes management.I spoke with Adam Harper regarding glycemic management post-transplant. Glucose levels have been stable compared to previous readings.The patient currently sees Dr. Dinah Beers for their diabetes management. Their next appointment is scheduled for 09/16/2020.The patient tests his sugars: 1 time(s) a dayPatient is not using MyChart glucose flowsheet to document blood glucose.BG readings are: 06/18 06/19 06/20  06/21 06/22 06/23  Breakfast 117 120 122 135 145 173 Lunch       Dinner       Night       Adam Harper has not experienced hypoglycemic events (e.g. BG < 70 mg/dL or s/sx including but not limited to hunger, tremor, sweating, headache). I have not counseled the patient on what to do in the setting of a hypoglycemic event.Patient's HbA1c are as follows:Lab Results Component Value Date  HGBA1C 7.3 (H) 08/05/2020  HGBA1C 7.1 (H) 06/05/2020  HGBA1C 5.7 (H) 11/05/2019  The patient is not due for a HbA1c to assess BG control.Current glucose management regimen is: nonePer protocol, the following changes were made to his regimen: noneThe patient does not require refillsPlan to follow-up in 2 weeks after his upcoming appointment with Dr. Dinah Beers.Creta Levin, PharmD6/23/2022

## 2020-09-09 ENCOUNTER — Inpatient Hospital Stay: Admit: 2020-09-09 | Discharge: 2020-09-09 | Payer: PRIVATE HEALTH INSURANCE | Primary: Family Medicine

## 2020-09-09 DIAGNOSIS — I447 Left bundle-branch block, unspecified: Secondary | ICD-10-CM

## 2020-09-09 DIAGNOSIS — Z992 Dependence on renal dialysis: Secondary | ICD-10-CM

## 2020-09-09 DIAGNOSIS — E785 Hyperlipidemia, unspecified: Secondary | ICD-10-CM

## 2020-09-09 DIAGNOSIS — N186 End stage renal disease: Secondary | ICD-10-CM

## 2020-09-10 ENCOUNTER — Telehealth: Admit: 2020-09-10 | Payer: PRIVATE HEALTH INSURANCE | Attending: Cardiovascular Disease | Primary: Family Medicine

## 2020-09-10 NOTE — Other
Stable aortic dimensions measuring 4.1 centimeters.

## 2020-09-10 NOTE — Telephone Encounter
-----   Message from Josetta Huddle, MD sent at 09/10/2020  4:45 PM EDT -----Stable aortic dimensions measuring 4.1 centimeters.

## 2020-09-11 NOTE — Telephone Encounter
I called pt and tried to go over the results, but patient needs an interpreter.

## 2020-09-12 NOTE — Telephone Encounter
Spoke w/ ptSpanish interpreter utilized [facility] message relayedPt verbalized understanding

## 2020-09-13 ENCOUNTER — Encounter: Admit: 2020-09-13 | Payer: PRIVATE HEALTH INSURANCE | Attending: Nephrology | Primary: Family Medicine

## 2020-09-13 DIAGNOSIS — R790 Abnormal level of blood mineral: Secondary | ICD-10-CM

## 2020-09-13 DIAGNOSIS — Z79899 Other long term (current) drug therapy: Secondary | ICD-10-CM

## 2020-09-13 DIAGNOSIS — Z940 Kidney transplant status: Secondary | ICD-10-CM

## 2020-09-16 ENCOUNTER — Ambulatory Visit: Admit: 2020-09-16 | Payer: PRIVATE HEALTH INSURANCE | Attending: "Endocrinology | Primary: Family Medicine

## 2020-09-16 ENCOUNTER — Encounter: Admit: 2020-09-16 | Payer: PRIVATE HEALTH INSURANCE | Primary: Family Medicine

## 2020-09-16 DIAGNOSIS — Z940 Kidney transplant status: Secondary | ICD-10-CM

## 2020-09-16 DIAGNOSIS — E139 Other specified diabetes mellitus without complications: Secondary | ICD-10-CM

## 2020-09-16 MED ORDER — MAGNESIUM OXIDE 400 MG (241.3 MG MAGNESIUM) TABLET
400241.3 mg (241.3 mg magnesium) | ORAL_TABLET | Freq: Two times a day (BID) | ORAL | 4 refills | 90.00 days | Status: AC
Start: 2020-09-16 — End: 2020-10-20
  Filled 2020-09-17: qty 60, 90d supply, fill #3
  Filled 2020-09-17: qty 180, 90d supply, fill #0

## 2020-09-16 MED ORDER — LINAGLIPTIN 5 MG TABLET
5 mg | ORAL_TABLET | Freq: Every day | ORAL | 4 refills | Status: AC
Start: 2020-09-16 — End: 2020-12-19

## 2020-09-17 MED FILL — CARVEDILOL IMMEDIATE RELEASE 25 MG TABLET: 25 mg | ORAL | 30 days supply | Qty: 60 | Fill #10 | Status: CP

## 2020-09-17 MED FILL — PREDNISONE 5 MG TABLET: 5 mg | ORAL | 30 days supply | Qty: 30 | Fill #3 | Status: CP

## 2020-09-17 MED FILL — TACROLIMUS XR 4 MG TABLET,EXTENDED RELEASE 24 HR: 4 mg | ORAL | 30 days supply | Qty: 60 | Fill #2 | Status: CP

## 2020-09-17 MED FILL — ATOVAQUONE 750 MG/5 ML ORAL SUSPENSION: 750 mg/5 mL | ORAL | 30 days supply | Qty: 300 | Fill #3 | Status: CP

## 2020-09-17 MED FILL — TACROLIMUS XR 1 MG TABLET,EXTENDED RELEASE 24 HR: 1 mg | ORAL | 45 days supply | Qty: 90 | Fill #2 | Status: CP

## 2020-09-17 MED FILL — TACROLIMUS XR 1 MG TABLET,EXTENDED RELEASE 24 HR: 1 mg | ORAL | 45 days supply | Qty: 90 | Fill #1 | Status: CP

## 2020-09-17 NOTE — Progress Notes
Adam Harper is seen in consultation for evaluation and management of DM. He is 60 y.o. 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  Pt denies Hx of DM, but A1c in the diabetic range since 05/2020 (pre-diabetes at time of [facility]). He was diagnosed with DM 4 months ago (05/2020).  He has no complications from diabetes.Visit translated by Spanish interpreter (ID # [id number])EA Regimen: noneHypoglycemia: n/aSMBG: Checking 1 x/day. Exercise: walking 1-2x/week, sometimes >1h.Review of Systems Constitutional: Negative for fever. Normal appetite change. Weight changeEyes: Negative for visual disturbance. Respiratory: Negative for cough. Sometime shortness of breath (not related to exercise) since COVID-19 infection. Already discussed with cardiology.Cardiovascular: Negative for chest pain. Denies abdominal pain, N/V.Neurological: Negative for numbness. Lower ext: positive for edema. No lesions.Current Outpatient Medications Medication Sig ? amLODIPine (NORVASC) 10 mg tablet Take 1 tablet (10 mg total) by mouth every morning. ? atorvastatin (LIPITOR) 40 mg tablet Take 1 tablet (40 mg total) by mouth daily. ? atovaquone (MEPRON) 750 mg/5 mL suspension Take 10 mLs (1,500 mg total) by mouth daily with dinner. ? 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 Please dispense glucometer covered by patient's insurance. ? 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 Please dispense supplies covered by patient's insurance. ? carvediloL (COREG) 25 mg Immediate Release tablet Take 1 tablet (25 mg total) by mouth 2 (two) times daily with breakfast and dinner. ? chlorthalidone (HYGROTEN) 50 mg tablet Take 1 tablet (50 mg total) by mouth daily. ? diclofenac (VOLTAREN) 1 % gel Apply topically 4 (four) times daily. (Patient not taking: Reported on 08/27/2020) ? ergocalciferol (VITAMIN D2) 1,250 mcg (50,000 unit) capsule Take 1 capsule (50,000 Units total) by mouth once a week. ? furosemide (LASIX) 40 mg tablet Take 1 tablet (40 mg total) by mouth daily. ? 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 Please dispense supplies covered by patient's insurance. ? lidocaine maalox diphenhydramine nystatin (MAGIC MOUTHWASH + NYSTATIN) 1:1:1:1 Swish and swallow 10 mLs 4 (four) times daily. (Patient taking differently: Swish and swallow 10 mLs as needed.) ? magnesium oxide (MAG-OX) 400 mg (241.3 mg magnesium) tablet Take 1 tablet (400 mg total) by mouth 2 (two) times daily. ? mycophenolate mofetil (CELLCEPT) 250 mg capsule Take 3 capsules (750 mg total) by mouth 2 (two) times daily. ? omeprazole (PRILOSEC) 20 mg capsule Take 1 capsule (20 mg total) by mouth daily. ? predniSONE (DELTASONE) 5 mg tablet Take 1 tablet (5 mg total) by mouth daily. Take after completion of taper. ? tacrolimus XR (ENVARSUS XR) 1 mg 24 hr tablet Take 2 tablets (2 mg total) by mouth daily. (Take 2 tablets by mouth daily along with 2 tablets of the 4 mg dose to make a total of 10 mg.) ? tacrolimus XR (ENVARSUS XR) 4 mg 24 hr tablet Take 2 tablets (8 mg total) by mouth daily. (Take 2 tablets by mouth daily along with 2 tablets of 1 mg to make a total of 10 mg). No current facility-administered medications for this visit. Patient Active Problem List Diagnosis ? FSGS (focal segmental glomerulosclerosis) ? Hyperparathyroidism (HC Code) (HC CODE) (HC Code) ? ROD (renal osteodystrophy) ? Hypertension ? Hypertriglyceridemia ? Obese ? 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  Colon polyps ? Internal hemorrhoids ? Mediastinal mass ? Arteriovenous fistula stenosis, subsequent encounter ? AVF (arteriovenous fistula) (HC Code) (HC CODE) (HC Code) ? Alkaline phosphatase raised ? Decreased testosterone level ? Gastroesophageal reflux disease ? Lipoprotein deficiency disorder ? Newborn affected by abnormality in fetal (intrauterine) heart rate or rhythm, unspecified as to time of onset ? Deceased-donor kidney transplant recipient ? Immunosuppressive management encounter following kidney transplant ? AKI (acute kidney injury) (HC Code) (HC CODE) (HC Code) ? Kidney transplant complication ? Kidney replaced by transplant No Known AllergiesSocial Hx:  reports that he has quit smoking. His smoking use included cigarettes. He has a 6.25 pack-year smoking history. He has never used smokeless tobacco. He reports that he does not drink alcohol and does not use drugs.No family history on file.Vital SignsBP 135/66 (Site: l a, Position: Sitting, Cuff Size: Large)  - Pulse 75  - Temp (!) 96.8 ?F (36 ?C) (Temporal)  - Wt 124.5 kg  - SpO2 96%  - BMI 40.52 kg/m? Wt Readings from Last 3 Encounters: 08/27/20 125.9 kg 08/26/20 125.6 kg 08/13/20 124.3 kg  Physical Exam Pt is in good spirits. Constitutional:  oriented to person, place, and time.  Neck: No thyromegaly present. Pulmonary/Chest: Breath sounds normal.Cardiovascular: Normal rate.  2+ murmur. Abdominal: Soft.  Exhibits no distension. There is no tenderness.  Neurological: Non-focalFoot Exam: There is no deformity or skin breakdown.Pulses are intact.Monofilament test does not indicate sensory loss.Presence of intertrigo between 4-5th L toes. Psychiatric: Normal mood and affect.Pertinent Labs/Imaging:Lab Results Component Value Date  WBC 5.2 08/26/2020  HGB 14.2 08/26/2020  HCT 41.50 08/26/2020  PLT 200 08/26/2020  NA 141 08/26/2020  K 3.4 08/26/2020  CL 99 08/26/2020  CREATININE 1.92 (H) 08/26/2020  CALCIUM 8.9 08/26/2020  MG 1.6 (L) 08/26/2020  CHOL 137 08/05/2020 TRIG 209 (H) 08/05/2020  HDL 36 (L) 08/05/2020  LDL 59 08/05/2020  ALT 16 44/03/270  AST 15 11/18/2019  TSH 2.080 06/05/2020  GLU 109 (H) 08/26/2020  HGBA1C 7.3 (H) 08/05/2020 Lab Results Component Value Date  HGBA1C 7.3 (H) 08/05/2020  HGBA1C 7.1 (H) 06/05/2020  HGBA1C 5.7 (H) 11/05/2019 Impression: 60 y.o. 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 Will reinforce lifestyle changes and start DPP-4, but will consider GLP-1 agonist if he continues to gain wt.Tradjenta 5 mg qd.- Referral to nutrition clinic.- Continue SMBG qd.- Of for pt to be discharged from [facility]acy Team.2) Hyperlipidemia:  LDL=59. On atorvastatin 40 mgqd.3) Blood Pressure: close to target.4) Eyes: pt to set up an eye exam appt.5) Neuropathy: monofilament test on today's exam (09/2020): normal sensation. Presence of fungal infection between toes. Pt instructed about foot care.

## 2020-09-18 ENCOUNTER — Ambulatory Visit
Admit: 2020-09-18 | Payer: PRIVATE HEALTH INSURANCE | Attending: Student in an Organized Health Care Education/Training Program | Primary: Family Medicine

## 2020-09-19 ENCOUNTER — Inpatient Hospital Stay: Admit: 2020-09-19 | Discharge: 2020-09-19 | Payer: PRIVATE HEALTH INSURANCE | Primary: Family Medicine

## 2020-09-19 ENCOUNTER — Ambulatory Visit: Admit: 2020-09-19 | Payer: PRIVATE HEALTH INSURANCE | Attending: "Endocrinology | Primary: Family Medicine

## 2020-09-19 DIAGNOSIS — E785 Hyperlipidemia, unspecified: Secondary | ICD-10-CM

## 2020-09-19 DIAGNOSIS — I447 Left bundle-branch block, unspecified: Secondary | ICD-10-CM

## 2020-09-19 DIAGNOSIS — N186 End stage renal disease: Secondary | ICD-10-CM

## 2020-09-19 DIAGNOSIS — Z992 Dependence on renal dialysis: Secondary | ICD-10-CM

## 2020-09-22 NOTE — Other
Echocardiogram is overall similar to prior.  Pumping function is normal with slightly elevated pressures in the lungs.  Aortic dimensions are slightly different but may be secondary to technical differences.  These measurements are stable compared to your recent [place] scan.  We will follow all.

## 2020-09-23 ENCOUNTER — Telehealth: Admit: 2020-09-23 | Payer: PRIVATE HEALTH INSURANCE | Attending: Cardiovascular Disease | Primary: Family Medicine

## 2020-09-23 MED FILL — CHLORTHALIDONE 50 MG TABLET: 50 mg | ORAL | 90 days supply | Qty: 90 | Fill #3 | Status: CP

## 2020-09-23 NOTE — Telephone Encounter
Spoke with pt and relayed MD message. Verbalized understanding, no further questions or concerns.Utilized over the phone Spanish Gregary Cromer ID# [phone]

## 2020-09-23 NOTE — Telephone Encounter
-----   Message from Josetta Huddle, MD sent at 09/22/2020 12:30 PM EDT -----Echocardiogram is overall similar to prior.  Pumping function is normal with slightly elevated pressures in the lungs.  Aortic dimensions are slightly different but may be secondary to technical differences.  These measurements are stable compared to your recent [place] scan.  We will follow all.

## 2020-09-24 ENCOUNTER — Telehealth
Admit: 2020-09-24 | Payer: PRIVATE HEALTH INSURANCE | Attending: Pharmacist Clinician (PhC)/ Clinical Pharmacy Specialist | Primary: Family Medicine

## 2020-09-24 NOTE — Other
TRANSPLANT PHARMACY:  POST-TRANSPLANT HYPERGLYCEMIA FOLLOW-UPLuis A Harper has been referred for diabetes management.I called [name] to sign off on him since he is being seen by Dr. Dinah Beers. I left a voicemail using interpreter  [phone]. [name]7/13/2022

## 2020-09-25 ENCOUNTER — Inpatient Hospital Stay: Admit: 2020-09-25 | Discharge: 2020-09-25 | Payer: BLUE CROSS/BLUE SHIELD | Primary: Family Medicine

## 2020-09-25 ENCOUNTER — Telehealth
Admit: 2020-09-25 | Payer: PRIVATE HEALTH INSURANCE | Attending: Pharmacist Clinician (PhC)/ Clinical Pharmacy Specialist | Primary: Family Medicine

## 2020-09-25 DIAGNOSIS — Z940 Kidney transplant status: Secondary | ICD-10-CM

## 2020-09-25 DIAGNOSIS — Z79899 Other long term (current) drug therapy: Secondary | ICD-10-CM

## 2020-09-25 LAB — CBC WITH AUTO DIFFERENTIAL
BKR CREATININE, URINE, RANDOM: 0.04 x 1000/??L (ref 0.00–1.00)
BKR PHOSPHORUS: 75.8 % — ABNORMAL HIGH (ref 39.0–72.0)
BKR WAM ABSOLUTE IMMATURE GRANULOCYTES.: 0.05 x 1000/??L (ref 0.00–0.30)
BKR WAM ABSOLUTE LYMPHOCYTE COUNT.: 0.82 x 1000/??L (ref 0.60–3.70)
BKR WAM ABSOLUTE NRBC (2 DEC): 0 x 1000/??L (ref 0.00–1.00)
BKR WAM ANALYZER ANC: 5.06 x 1000/??L (ref 2.00–7.60)
BKR WAM BASOPHIL ABSOLUTE COUNT.: 0.04 x 1000/??L (ref 0.00–1.00)
BKR WAM BASOPHILS: 0.6 % (ref 0.0–1.4)
BKR WAM EOSINOPHIL ABSOLUTE COUNT.: 0.07 x 1000/??L (ref 0.00–1.00)
BKR WAM EOSINOPHILS: 1 % (ref 0.0–5.0)
BKR WAM HEMATOCRIT (2 DEC): 38.8 % — ABNORMAL HIGH (ref 38.50–50.00)
BKR WAM HEMOGLOBIN: 13.1 g/dL — ABNORMAL LOW (ref 13.2–17.1)
BKR WAM IMMATURE GRANULOCYTES: 0.7 % (ref 0.0–1.0)
BKR WAM LYMPHOCYTES: 12.3 % — ABNORMAL LOW (ref 17.0–50.0)
BKR WAM MCH (PG): 32 pg (ref 27.0–33.0)
BKR WAM MCHC: 33.8 g/dL (ref 31.0–36.0)
BKR WAM MCV: 94.6 fL (ref 80.0–100.0)
BKR WAM MONOCYTE ABSOLUTE COUNT.: 0.64 x 1000/??L (ref 0.00–1.00)
BKR WAM MPV: 10.3 fL (ref 8.0–12.0)
BKR WAM NEUTROPHILS: 75.8 % — ABNORMAL HIGH (ref 39.0–72.0)
BKR WAM PLATELETS: 227 x1000/??L (ref 150–420)
BKR WAM RDW-CV: 13.4 % (ref 11.0–15.0)
BKR WAM RED BLOOD CELL COUNT.: 4.1 M/??L (ref 4.00–6.00)
BKR WAM WHITE BLOOD CELL COUNT: 6.7 x1000/??L (ref 4.0–11.0)

## 2020-09-25 LAB — BASIC METABOLIC PANEL
BKR ANION GAP: 18 — ABNORMAL HIGH (ref 7–17)
BKR BLOOD UREA NITROGEN: 17 mg/dL (ref 6–20)
BKR BUN / CREAT RATIO: 8.8 (ref 8.0–23.0)
BKR CALCIUM: 9.2 mg/dL (ref 8.8–10.2)
BKR CHLORIDE: 99 mmol/L (ref 98–107)
BKR CO2: 26 mmol/L (ref 20–30)
BKR CREATININE: 1.93 mg/dL — ABNORMAL HIGH (ref 0.40–1.30)
BKR EGFR (AFR AMER): 44 mL/min/1.73m2 (ref >60)
BKR EGFR (NON AFRICAN AMERICAN): 36 mL/min/{1.73_m2} (ref >60)
BKR GLUCOSE: 107 mg/dL — ABNORMAL HIGH (ref 70–100)
BKR POTASSIUM: 3.4 mmol/L (ref 3.3–5.3)
BKR SODIUM: 143 mmol/L (ref 136–144)

## 2020-09-25 LAB — MAGNESIUM: BKR MAGNESIUM: 1.7 mg/dL (ref 1.7–2.4)

## 2020-09-25 LAB — PHOSPHORUS     (BH GH L LMW YH): BKR PHOSPHORUS: 4.1 mg/dL (ref 2.2–4.5)

## 2020-09-25 LAB — TACROLIMUS LEVEL     (BH GH L LMW YH): BKR TACROLIMUS BLOOD: 8.2 ng/mL

## 2020-09-25 NOTE — Other
TRANSPLANT PHARMACY:  POST-TRANSPLANT HYPERGLYCEMIA FOLLOW-UPLuis A Harper has been referred for diabetes management.Perkins has seen Dr. Dinah Beers and she said that we do not need to follow him anymore. I called to let him know that we would be signing off but was not able to talk with him. Patient returned phone call earlier but did not talk with a pharmacist. Jamal Maes returning phone call and LVM with interpreter [phone]. Plan to follow-up  Haydan Mansouri Hopkins7/14/2022

## 2020-09-26 ENCOUNTER — Ambulatory Visit: Admit: 2020-09-26 | Payer: PRIVATE HEALTH INSURANCE | Primary: Family Medicine

## 2020-09-26 ENCOUNTER — Ambulatory Visit: Admit: 2020-09-26 | Payer: PRIVATE HEALTH INSURANCE | Attending: Nephrology | Primary: Family Medicine

## 2020-09-26 ENCOUNTER — Encounter: Admit: 2020-09-26 | Payer: PRIVATE HEALTH INSURANCE | Primary: Family Medicine

## 2020-09-26 DIAGNOSIS — E139 Other specified diabetes mellitus without complications: Secondary | ICD-10-CM

## 2020-09-26 DIAGNOSIS — Z940 Kidney transplant status: Secondary | ICD-10-CM

## 2020-09-26 DIAGNOSIS — Z79899 Other long term (current) drug therapy: Secondary | ICD-10-CM

## 2020-09-26 DIAGNOSIS — I10 Essential (primary) hypertension: Secondary | ICD-10-CM

## 2020-09-26 DIAGNOSIS — R790 Abnormal level of blood mineral: Secondary | ICD-10-CM

## 2020-09-26 LAB — CYTOMEGALOVIRUS QUANTITATIVE PCR, PLASMA     (BH GH L LMW YH): BKR CMV QUANTITATIVE PCR: NOT DETECTED

## 2020-09-26 LAB — BK VIRUS BY PCR, QUANTITATIVE, PLASMA: BKR BK VIRUS, QUANTITATIVE PCR, PLASMA: NOT DETECTED

## 2020-09-26 MED ORDER — FUROSEMIDE 40 MG TABLET
40 mg | ORAL_TABLET | Freq: Every day | ORAL | 5 refills | Status: AC
Start: 2020-09-26 — End: 2021-03-03

## 2020-09-27 DIAGNOSIS — Z79899 Other long term (current) drug therapy: Secondary | ICD-10-CM

## 2020-09-27 DIAGNOSIS — R790 Abnormal level of blood mineral: Secondary | ICD-10-CM

## 2020-09-27 DIAGNOSIS — Z940 Kidney transplant status: Secondary | ICD-10-CM

## 2020-10-02 ENCOUNTER — Encounter: Admit: 2020-10-02 | Payer: PRIVATE HEALTH INSURANCE | Attending: Pharmacotherapy | Primary: Family Medicine

## 2020-10-02 NOTE — Other
TRANSPLANT PHARMACY:  POST-TRANSPLANT HYPERGLYCEMIA FOLLOW-UPLuis A Harper has been referred for diabetes management.Patient has established care with Dr. Dinah Beers. Transplant pharmacy will sign off at this time.[name], PharmD7/21/2022

## 2020-10-03 MED ORDER — TACROLIMUS XR 1 MG TABLET,EXTENDED RELEASE 24 HR
1 mg | ORAL_TABLET | Freq: Every day | ORAL | 12 refills | Status: AC
Start: 2020-10-03 — End: 2020-11-07

## 2020-10-03 NOTE — Progress Notes
[facility] [place] [facility]ENTERKID[facility] TRANSPLANT PROGRAMMEDICAL FOLLOW UP VISIT CHIEF COMPLAINT/REASON FOR VISIT Adam Harper is seen in clinic to for a follow up visit regarding:- Evaluation of kidney allograft function - Assessment of immunosuppression medication efficacy and side effects- Assessment and management of blood pressureTRANSPLANT HISTORY ?	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	CMV D + / R + ?	Donor Toxo positive ?	CPRA zero, Alemtuzumab ?	9/4-9/7: admit for hyperK+, urgent HD. Bactrim switched to AtovaquoneINTERIM HISTORY  Adam Harper comes in for routine follow-up of his allograft function. He feels much better overall. Working with pharmacy for glycemic management; brings reading from his glucometer which show much improved glycemic control just with dietary modification as well as completion of steroid taper. Blood pressure not frequently checked at home. Denies SOB, LE swelling, chest pain. Overall doing well. Interview conducted using video interpretor. This time has back pain - since 3-4 monthsREVIEW OF SYSTEMS  General: no generalized weakness, fatigue, fever or chillsCardio: no chest pain, shortness of breath bipedal edemaPulm: no cough, sputum production or wheezingGI: no nausea, vomiting, diarrhea or constipation GU: no dysuria or hematuriaSkin: no new rashes or skin lesionsNeuro: no focal weakness, tremors, speech disturbanceMEDICAL HISTORY Past Medical History: Diagnosis Date ? A-V fistula (HC Code) (HC CODE) (HC Code)   right arm ? Anemia  ? 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 Overview:  Added automatically from request for surgery [id number] Overview:  Added automatically from request for surgery [id number] ? Anemia in ESRD (end-stage renal disease) (HC Code) 10/24/2014  Added automatically from request for surgery [id number]  Added automatically from request for surgery [id number] Overview:  Added automatically from request for surgery [id number] ? AV fistula infection (HC Code) (HC CODE) (HC Code)  ? Cirrhosis (HC Code) (HC CODE) (HC Code)  ? ESRD on hemodialysis (HC Code) (HC CODE) (HC Code)   Mon-Wed-Fri ? GERD (gastroesophageal reflux disease)  ? Hypercholesteremia  ? Hyperparathyroidism (HC Code) (HC CODE) (HC Code)  ? Hypertension  ? Infected prosthetic vascular graft, initial encounter (HC Code) (HC CODE) (HC Code) 10/24/2014 ? Influenza A  ? Morbid obesity (HC Code) (HC CODE) (HC Code) 02/03/2018 ? Obstructive sleep apnea   pt uses CPAP ? Secondary hyperparathyroidism of renal origin (HC Code) (HC CODE) (HC Code)  ? Thoracic aortic aneurysm (HC Code)   Past Surgical History: Procedure Laterality Date ? AV FISTULA PLACEMENT Left   left lower arm unsuccessful, then moved to upper arm 2011, then had problems in 10/2013 - infection  No family history on file. Social History Tobacco Use ? Smoking status: Former Smoker   Packs/day: 0.25   Years: 25.00   Pack years: 6.25   Types: Cigarettes ? Smokeless tobacco: Never Used Substance Use Topics ? Alcohol use: No  Past medical, surgical, family and social history reviewed. No changes at this time.MEDICATIONS Outpatient Encounter Medications as of 09/26/2020 Medication Sig ? amLODIPine (NORVASC) 10 mg tablet Take 1 tablet (10 mg total) by mouth every morning. ? atorvastatin (LIPITOR) 40 mg tablet Take 1 tablet (40 mg total) by mouth daily. ? atovaquone (MEPRON) 750 mg/5 mL suspension Take 10 mLs (1,500 mg total) by mouth daily with dinner. ? 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 Please dispense glucometer covered by patient's insurance. ? blood sugar diagnostic test strips Use to check blood sugar 3 times per day [Type 2 Diabetes:  E11.9]. Please dispense supplies covered by patient's insurance. ? carvediloL (COREG) 25 mg Immediate Release tablet Take 1 tablet (25 mg total) by mouth 2 (two) times daily with breakfast and dinner. ? chlorthalidone (HYGROTEN) 50 mg tablet Take 1 tablet (50 mg total) by mouth daily. ? diclofenac (VOLTAREN) 1 % gel Apply topically 4 (four) times daily. (Patient not taking: Reported on 08/27/2020) ? ergocalciferol (VITAMIN D2) 1,250 mcg (50,000 unit) capsule Take 1 capsule (50,000 Units total) by mouth once a week. ? furosemide (LASIX) 40 mg tablet Take 1 tablet (40 mg total) by mouth daily. ? 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 Please dispense supplies covered by patient's insurance. ? lidocaine maalox diphenhydramine nystatin (MAGIC MOUTHWASH + NYSTATIN) 1:1:1:1 Swish and swallow 10 mLs 4 (four) times daily. (Patient taking differently: Swish and swallow 10 mLs as needed.) ? linaGLIPtin (TRADJENTA) 5 mg Tab tablet Take 1 tablet (5 mg total) by mouth daily. ? magnesium oxide (MAG-OX) 400 mg (241.3 mg magnesium) tablet Take 1 tablet (400 mg total) by mouth 2 (two) times daily. ? mycophenolate mofetil (CELLCEPT) 250 mg capsule Take 3 capsules (750 mg total) by mouth 2 (two) times daily. ? omeprazole (PRILOSEC) 20 mg capsule Take 1 capsule (20 mg total) by mouth daily. ? predniSONE (DELTASONE) 5 mg tablet Take 1 tablet (5 mg total) by mouth daily. Take after completion of taper. ? tacrolimus XR (ENVARSUS XR) 1 mg 24 hr tablet Take 2 tablets (2 mg total) by mouth daily. (Take 2 tablets by mouth daily along with 2 tablets of the 4 mg dose to make a total of 10 mg.) ? tacrolimus XR (ENVARSUS XR) 4 mg 24 hr tablet Take 2 tablets (8 mg total) by mouth daily. (Take 2 tablets by mouth daily along with 2 tablets of 1 mg to make a total of 10 mg). No Known AllergiesPHYSICAL EXAM  BP (!) 141/71 Comment: reported to MD - Pulse 82  - Temp 97.6 ?F (36.4 ?C) (Temporal)  - Wt 123.9 kg  - SpO2 95%  - BMI 40.34 kg/m? Wt Readings from Last 4 Encounters: 09/26/20 123.9 kg 09/16/20 124.5 kg 08/27/20 125.9 kg 08/26/20 125.6 kg   Gen: not in acute distressHEENT: atraumatic, pale palpebral conjunctivaNeck: no cervical lymphadenopathy, trachea midlineCV: regular rate and rhythm, distinct S1 and S2, no murmurs appreciated Resp: clear breath sounds bilaterally, no wheezing or crackles appreciatedAbd: non-distended, normoactive bowel sounds, non-tender to palpationExt: no pedal edemaNeuro: Awake, alert, no tremors, motor function grossly intactPERTINENT LABS Lab Results Component Value Date  WBC 6.7 09/25/2020  HGB 13.1 (L) 09/25/2020  HCT 38.80 09/25/2020  PLT 227 09/25/2020  GLU 107 (H) 09/25/2020  BUN 17 09/25/2020  CREATININE 1.93 (H) 09/25/2020  CO2 26 09/25/2020  CL 99 09/25/2020  NA 143 09/25/2020  K 3.4 09/25/2020  CALCIUM 9.2 09/25/2020  EGFR 8 06/21/2012  PRCRRAU 0.20 (H) 08/26/2020  PHOS 4.1 09/25/2020  PTH 35.2 05/21/2020  VITAMIND25HY 23 06/05/2020  HGBA1C 7.3 (H) 08/05/2020  IRON 80 01/30/2020  TIBC 275 01/30/2020  FERRITIN 1,163 (H) 01/30/2020 Lab Results Component Value Date  TACROLIMUS 8.2 09/25/2020 ASSESSMENT AND PLAN Adam Harper is seen in clinic today for a follow up visit regarding the patient's renal transplant.ESRD S/P KTProtocol biopsy 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 s/p steroid pulse and taper now completeCr 1.95 on last check; he usually resides between 1.8-2.0Lytes and acid/base stableAdvised increase K in his dietImmunosuppressionEnv 10 mg, prednisone 5 mg and MMF 750 mg BIDWell-timed level  is 5.1; no change today, will repeatProphylaxis and SurveillanceBK ND on 5/3/2022Transient CMV viremia, now cleared neg on 5/3Blood Pressure managementAdvised to check BP twice a day and bring in log with next visitPost-transplant DM:Followed by [facility] pharmacy, overall improved with dietary modification and completion of steroid taper - not on any hypoglycemic agentsWeight gain 11 Kg since Transplant - strongly recommended excerciise and diet modificationWill send to O[facility] clinicF/U in 1 month

## 2020-10-10 MED FILL — PREDNISONE 5 MG TABLET: 5 mg | ORAL | 30 days supply | Qty: 30 | Fill #4 | Status: CP

## 2020-10-10 MED FILL — ATOVAQUONE 750 MG/5 ML ORAL SUSPENSION: 750 mg/5 mL | ORAL | 30 days supply | Qty: 300 | Fill #4 | Status: CP

## 2020-10-10 MED FILL — TACROLIMUS XR 4 MG TABLET,EXTENDED RELEASE 24 HR: 4 mg | ORAL | 30 days supply | Qty: 60 | Fill #3 | Status: CP

## 2020-10-10 MED FILL — CARVEDILOL IMMEDIATE RELEASE 25 MG TABLET: 25 mg | ORAL | 30 days supply | Qty: 60 | Fill #11 | Status: CP

## 2020-10-13 MED FILL — OMEPRAZOLE 20 MG CAPSULE,DELAYED RELEASE: 20 mg | ORAL | 90 days supply | Qty: 90 | Fill #1 | Status: CP

## 2020-10-13 MED FILL — OMEPRAZOLE 20 MG CAPSULE,DELAYED RELEASE: 20 mg | ORAL | 90 days supply | Qty: 90 | Fill #4 | Status: CP

## 2020-10-20 ENCOUNTER — Encounter: Admit: 2020-10-20 | Payer: PRIVATE HEALTH INSURANCE | Attending: Nephrology | Primary: Family Medicine

## 2020-10-20 ENCOUNTER — Encounter: Admit: 2020-10-20 | Payer: PRIVATE HEALTH INSURANCE | Attending: Transplant Surgery | Primary: Family Medicine

## 2020-10-20 ENCOUNTER — Encounter: Admit: 2020-10-20 | Payer: PRIVATE HEALTH INSURANCE | Primary: Family Medicine

## 2020-10-20 DIAGNOSIS — Z2989 Need for prophylactic immunotherapy: Secondary | ICD-10-CM

## 2020-10-20 DIAGNOSIS — I10 Essential (primary) hypertension: Secondary | ICD-10-CM

## 2020-10-20 DIAGNOSIS — Z940 Kidney transplant status: Secondary | ICD-10-CM

## 2020-10-20 DIAGNOSIS — R790 Abnormal level of blood mineral: Secondary | ICD-10-CM

## 2020-10-20 DIAGNOSIS — Z79899 Other long term (current) drug therapy: Secondary | ICD-10-CM

## 2020-10-20 MED ORDER — CHLORTHALIDONE 50 MG TABLET
50 mg | ORAL_TABLET | Freq: Every day | ORAL | 4 refills | 90.00 days | Status: AC
Start: 2020-10-20 — End: 2022-01-01
  Filled 2020-12-26: qty 90, 90d supply, fill #0
  Filled 2021-06-18: qty 90, 90d supply, fill #4

## 2020-10-20 MED ORDER — MAGNESIUM OXIDE 400 MG (241.3 MG MAGNESIUM) TABLET
400241.3 mg (241.3 mg magnesium) | ORAL_TABLET | Freq: Two times a day (BID) | ORAL | 4 refills | 30.00 days | Status: AC
Start: 2020-10-20 — End: 2021-05-14

## 2020-10-20 MED ORDER — AMLODIPINE 10 MG TABLET
10 mg | ORAL_TABLET | Freq: Every morning | ORAL | 4 refills | 90.00 days | Status: AC
Start: 2020-10-20 — End: 2021-10-29
  Filled 2020-11-19: qty 90, 90d supply, fill #0
  Filled 2020-11-19: qty 90, 90d supply, fill #4

## 2020-10-20 MED ORDER — CARVEDILOL IMMEDIATE RELEASE 25 MG TABLET
25 mg | ORAL_TABLET | Freq: Two times a day (BID) | ORAL | 12 refills | 30.00 days | Status: AC
Start: 2020-10-20 — End: 2021-10-29
  Filled 2020-11-19: qty 60, 30d supply, fill #0

## 2020-10-20 MED FILL — MYCOPHENOLATE MOFETIL 250 MG CAPSULE: 250 mg | ORAL | 30 days supply | Qty: 180 | Fill #1 | Status: CP

## 2020-10-20 MED FILL — ATORVASTATIN 40 MG TABLET: 40 mg | ORAL | 90 days supply | Qty: 90 | Fill #2 | Status: CP

## 2020-10-28 ENCOUNTER — Ambulatory Visit: Admit: 2020-10-28 | Payer: BLUE CROSS/BLUE SHIELD | Attending: Neurology | Primary: Family Medicine

## 2020-10-28 ENCOUNTER — Encounter: Admit: 2020-10-28 | Payer: PRIVATE HEALTH INSURANCE | Attending: Neurology | Primary: Family Medicine

## 2020-10-28 DIAGNOSIS — K7460 Unspecified cirrhosis of liver: Secondary | ICD-10-CM

## 2020-10-28 DIAGNOSIS — I770 Arteriovenous fistula, acquired: Secondary | ICD-10-CM

## 2020-10-28 DIAGNOSIS — R499 Unspecified voice and resonance disorder: Secondary | ICD-10-CM

## 2020-10-28 DIAGNOSIS — E7800 Pure hypercholesterolemia, unspecified: Secondary | ICD-10-CM

## 2020-10-28 DIAGNOSIS — N2581 Secondary hyperparathyroidism of renal origin: Secondary | ICD-10-CM

## 2020-10-28 DIAGNOSIS — D649 Anemia, unspecified: Secondary | ICD-10-CM

## 2020-10-28 DIAGNOSIS — G4733 Obstructive sleep apnea (adult) (pediatric): Secondary | ICD-10-CM

## 2020-10-28 DIAGNOSIS — J101 Influenza due to other identified influenza virus with other respiratory manifestations: Secondary | ICD-10-CM

## 2020-10-28 DIAGNOSIS — T827XXA Infection and inflammatory reaction due to other cardiac and vascular devices, implants and grafts, initial encounter: Secondary | ICD-10-CM

## 2020-10-28 DIAGNOSIS — I10 Essential (primary) hypertension: Secondary | ICD-10-CM

## 2020-10-28 DIAGNOSIS — I712 Thoracic aortic aneurysm (HC Code): Secondary | ICD-10-CM

## 2020-10-28 DIAGNOSIS — N186 End stage renal disease: Secondary | ICD-10-CM

## 2020-10-28 DIAGNOSIS — R760 Raised antibody titer: Secondary | ICD-10-CM

## 2020-10-28 DIAGNOSIS — E213 Hyperparathyroidism, unspecified: Secondary | ICD-10-CM

## 2020-10-28 DIAGNOSIS — K219 Gastro-esophageal reflux disease without esophagitis: Secondary | ICD-10-CM

## 2020-10-28 NOTE — Progress Notes
[facility]:  Anterior mediastinal imaging abnormality and positive AChR binding autoantibodiesHPI: [name] is a 60 y.o. 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  Please refer to prior notes for additional details.  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  Repetitive nerve stimulation testing was normal in the past.Patient was referred to thoracic surgery, Dr. Westly Pam, who did not think that patient's mediastinal abnormality was a thymoma but rather likely a benign cyst, and stable over the last 6 years comparing repeat imaging.Past Medical History: Diagnosis Date ? A-V fistula (HC Code) (HC CODE) (HC Code)   right arm ? Anemia  ? 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 Overview:  Added automatically from request for surgery [id number] Overview:  Added automatically from request for surgery [id number] ? Anemia in ESRD (end-stage renal disease) (HC Code) 10/24/2014  Added automatically from request for surgery [id number]  Added automatically from request for surgery [id number] Overview:  Added automatically from request for surgery [id number] ? AV fistula infection (HC Code) (HC CODE) (HC Code)  ? Cirrhosis (HC Code) (HC CODE) (HC Code)  ? ESRD on hemodialysis (HC Code) (HC CODE) (HC Code)   Mon-Wed-Fri ? GERD (gastroesophageal reflux disease)  ? Hypercholesteremia  ? Hyperparathyroidism (HC Code) (HC CODE) (HC Code)  ? Hypertension  ? Infected prosthetic vascular graft, initial encounter (HC Code) (HC CODE) (HC Code) 10/24/2014 ? Influenza A  ? Morbid obesity (HC Code) (HC CODE) (HC Code) 02/03/2018 ? Obstructive sleep apnea   pt uses CPAP ? Secondary hyperparathyroidism of renal origin (HC Code) (HC CODE) (HC Code)  ? Thoracic aortic aneurysm (HC Code)  Past Surgical History: Procedure Laterality Date ? AV FISTULA PLACEMENT Left   left lower arm unsuccessful, then moved to upper arm 2011, then had problems in 10/2013 - infection Allergies: Patient has no known allergies.Current Outpatient Medications Medication Sig ? amLODIPine Take 1 tablet (10 mg total) by mouth every morning. ? atorvastatin Take 1 tablet (40 mg total) by mouth daily. ? atovaquone Take 10 mLs (1,500 mg total) by mouth daily with dinner. ? BLOOD GLUCOSE METER Use to check blood sugar 3 times per day [Type 2 Diabetes: E11.9]. Please dispense glucometer covered by patient's insurance. ? blood sugar diagnostic Use to check blood sugar 3 times per day [Type 2 Diabetes: E11.9]. Please dispense supplies covered by patient's insurance. ? carvediloL Take 1 tablet (25 mg total) by mouth 2 (two) times daily with breakfast and dinner. ? chlorthalidone Take 1 tablet (50 mg total) by mouth daily. ? ergocalciferol Take 1 capsule (50,000 Units total) by mouth once a week. ? furosemide Take 1 tablet (40 mg total) by mouth daily. ? 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 Please dispense supplies covered by patient's insurance. ? lidocaine maalox  diphenhydramine nystatin (MAGIC MOUTHWASH + NYSTATIN) 1:1:1:1 Swish and swallow 10 mLs 4 (four) times daily. (Patient taking differently: Swish and swallow 10 mLs as needed.) ? linaGLIPtin Take 1 tablet (5 mg total) by mouth daily. ? magnesium oxide Take 1 tablet (400 mg total) by mouth 2 (two) times daily. ? mycophenolate mofetil Take 3 capsules (750 mg total) by mouth 2 (two) times daily. ? omeprazole Take 1 capsule (20 mg total) by mouth daily. ? predniSONE Take 1 tablet (5 mg total) by mouth daily. Take after completion of taper. ? tacrolimus XR Take 2 tablets (2 mg total) by mouth daily. Take 1 mg to make dose to make total 9 mg ? tacrolimus XR Take 2 tablets (8 mg total) by mouth daily. (Take 2 tablets by mouth daily along with 2 tablets of 1 mg to make a total of 10 mg). ? diclofenac Apply topically 4 (four) times daily. (Patient not taking: No sig reported) No current facility-administered medications for this visit. Family History:  Patient denies any known family history of autoimmunity.Social History Tobacco Use ? Smoking status: Former Smoker   Packs/day: 0.25   Years: 25.00   Pack years: 6.25   Types: Cigarettes ? Smokeless tobacco: Never Used Vaping Use ? Vaping Use: Never used Substance Use Topics ? Alcohol use: No ? Drug use: No Review of Systems:  I have reviewed and verified ROS.  Please refer to medical assistant note for details.  Physical Exam BP 130/79 (Site: l a, Position: Sitting, Cuff Size: Large)  - Pulse 66  - Temp 97.4 ?F (36.3 ?C)  - Ht 5' 8 (1.727 m)  - Wt 126.1 kg  - SpO2 96%  - BMI 42.27 kg/m? General Exam General: no apparent distress, pleasant and cooperative. Single breath count 25 out of 30.  Head: NC, ATLungs: clear to auscultation bilaterallyHeart: regular Abdomen: soft, non-tenderExtremities: extremities normal, atraumatic, no cyanosis or edema Neurological Exam Mental Status  Patient is alert, Oriented to person, place, time and situation.Cranial Nerves Nystagmus: absent. Ptosis:  Right- absent. Left- absent. II Pupils: PERRLVisual Fields- intact  EOM's- intact.  No diplopia on right or left horizontal gaze.  No diplopia on vertical up gaze.    V Facial Sensation-  Bilateral pin prick and soft touch normal.VII Facial Strength- symmetrical, strong bilaterally.  No myasthenic snarl. VIII Hearing: right- NL.  left- NL.  X Uvula/palate rise: midline and normal.  XI Sternocleidomastoid: right- 5/5. left- 5/5. Trapezius: right- 5/5. left- 5/5.  XII Tongue- midline, fasiculations absent. Speech- Moderate dysarthria on exam (pt notes that he has had this for many years and denies change). Motor Bulk- normal throughout  Tone- normal throughout  Grip: 		R- 5/5  L- 5/5 Finger ext:	R- 5/5  L- 5/5  Wrist flex:	R- 5/5  L- 5/5  Wrist ext:	R- 5/5  L- 5/5  Bicep:		R- 5/5  L- 5/5  Tricep:		R- 5/5  L- 5/5  No fatigue with repeat testing Deltoid:		R- 5/5  L- 5/5  No fatigue with repeat testing Neck Flex: 	5/5  No fatigue with repeat testing Neck ext: 	5/5  Iliopsoas:	R- 5/5  L- 5/5  No fatigue with repeat testing Quads:		R- 5/5  L- 5/5 Hamstr:		R- 5/5  L- 5/5  Dorsiflex:	R- 5/5  L- 5/5 Plantarflex:	R- 5/5  L- 5/5  JAMAR Dynamometry:  Rt- 48 kg   --  Lt- 41 kgSensory  Light Touch- normal, no extinction. CerebellarFinger to nose- normal bilaterally.  Heel to shin - normal bilaterally.  Gait normal stance and stride. Reflexes 1-2 and symmetric  MG-ADL Score:   0 (refer to source document for details)MGC Score:   0 (refer to source document for details)MGFA Clinical Classification: NAMGFA PIS: NAAssessment Mr. Saiki is a 60 y.o. 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  Patient's clinical exam remains stable since the time of his last evaluation in 2021 with no signs or symptoms of active myasthenia gravis at this time.  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  I am reassured by patient has normal electrodiagnostic studies, specifically repetitive nerve stimulation, which did not demonstrate evidence for a disorder of postsynaptic neuromuscular transmission.  Repeat antibody level was decreased and just slightly above the reference range cut off.  Suspect that this might be a false-positive and could be due to patient's end-stage renal disease.  My suspicion for autoimmune myasthenia gravis is low at this time. I recommend that we follow patient's clinical course over time and will schedule a follow-up visit with me in approximately 12-18 months. My overall impression and recommendations were reviewed with patient today. I did review with patient typical signs and symptoms of myasthenia gravis, in ask that patient contact us should any of these symptoms emerge or with any specific questions.  All questions answered at time of our visit.  Today's visit was conducted with interpreter services.Plan1. Follow clinical course2. Monitor for signs and symptoms of active disease3. Clinic follow-up in 12-18 months Patient instructed to contact the office with any questions or change in symptoms immediately.  I have also asked that patient follow-up with their primary care physician for any issues not addressed as part of visit today in the [facility]. Today's visit was completed with in-person Spanish language interpreter services.Please excuse any transcription errors as the above record was in part created utilizing medical dictation software.Electronically Signed by Jayme Cloud, MD, October 28, 2020

## 2020-10-30 ENCOUNTER — Inpatient Hospital Stay: Admit: 2020-10-30 | Discharge: 2020-10-30 | Payer: BLUE CROSS/BLUE SHIELD | Primary: Family Medicine

## 2020-10-30 ENCOUNTER — Encounter: Admit: 2020-10-30 | Payer: PRIVATE HEALTH INSURANCE | Primary: Family Medicine

## 2020-10-30 DIAGNOSIS — Z79899 Other long term (current) drug therapy: Secondary | ICD-10-CM

## 2020-10-30 DIAGNOSIS — Z940 Kidney transplant status: Secondary | ICD-10-CM

## 2020-10-30 LAB — CBC WITH AUTO DIFFERENTIAL
BKR WAM ABSOLUTE IMMATURE GRANULOCYTES.: 0.03 x 1000/??L (ref 0.00–0.30)
BKR WAM ABSOLUTE LYMPHOCYTE COUNT.: 0.79 x 1000/??L (ref 0.60–3.70)
BKR WAM ABSOLUTE NRBC (2 DEC): 0 x 1000/??L (ref 0.00–1.00)
BKR WAM ANALYZER ANC: 4.53 x 1000/??L — ABNORMAL HIGH (ref 2.00–7.60)
BKR WAM BASOPHIL ABSOLUTE COUNT.: 0.03 x 1000/??L (ref 0.00–1.00)
BKR WAM BASOPHILS: 0.5 % (ref 0.0–1.4)
BKR WAM EOSINOPHIL ABSOLUTE COUNT.: 0.07 x 1000/??L (ref 0.00–1.00)
BKR WAM EOSINOPHILS: 1.1 % (ref 0.0–5.0)
BKR WAM HEMATOCRIT (2 DEC): 40.3 % (ref 38.50–50.00)
BKR WAM HEMOGLOBIN: 13 g/dL — ABNORMAL LOW (ref 13.2–17.1)
BKR WAM IMMATURE GRANULOCYTES: 0.5 % (ref 0.0–1.0)
BKR WAM LYMPHOCYTES: 12.9 % — ABNORMAL LOW (ref 17.0–50.0)
BKR WAM MCH (PG): 30.8 pg (ref 27.0–33.0)
BKR WAM MCHC: 32.3 g/dL (ref 31.0–36.0)
BKR WAM MCV: 95.5 fL (ref 80.0–100.0)
BKR WAM MONOCYTE ABSOLUTE COUNT.: 0.67 x 1000/??L (ref 0.00–1.00)
BKR WAM MONOCYTES: 10.9 % (ref 4.0–12.0)
BKR WAM MPV: 9.9 fL (ref 8.0–12.0)
BKR WAM NEUTROPHILS: 74.1 % — ABNORMAL HIGH (ref 39.0–72.0)
BKR WAM NUCLEATED RED BLOOD CELLS: 0 % (ref 0.0–1.0)
BKR WAM PLATELETS: 212 x1000/??L (ref 150–420)
BKR WAM RDW-CV: 13.9 % (ref 11.0–15.0)
BKR WAM RED BLOOD CELL COUNT.: 4.22 M/??L (ref 4.00–6.00)
BKR WAM WHITE BLOOD CELL COUNT: 6.1 x1000/??L (ref 4.0–11.0)

## 2020-10-30 LAB — ALBUMIN/CREATININE PANEL, URINE, RANDOM
BKR ALBUMIN, URINE, RANDOM: 170.4 mg/L
BKR CREATININE, URINE, RANDOM: 218 mg/dL

## 2020-10-30 LAB — PROTEIN, TOTAL W/CREATININE, URINE, RANDOM     (BH GH LMW YH)
BKR CREATININE, URINE, RANDOM: 218 mg/dL (ref 0.00–1.00)
BKR PROTEIN URINE RANDOM: 0.34 g/L
BKR PROTEIN/CREATININE RATIO, URINE, RANDOM: 0.16 mg/mg{creat} — ABNORMAL HIGH (ref <0.10)

## 2020-10-30 LAB — TACROLIMUS LEVEL     (BH GH L LMW YH): BKR TACROLIMUS BLOOD: 6.2 ng/mL

## 2020-10-30 LAB — MAGNESIUM: BKR MAGNESIUM: 1.8 mg/dL (ref 1.7–2.4)

## 2020-10-30 LAB — PHOSPHORUS     (BH GH L LMW YH): BKR PHOSPHORUS: 3.3 mg/dL (ref 2.2–4.5)

## 2020-10-30 NOTE — Progress Notes
MD unavailable, verbal obtained from Dr. Phylliss Blakes orders placed.

## 2020-10-31 ENCOUNTER — Encounter: Admit: 2020-10-31 | Payer: PRIVATE HEALTH INSURANCE | Primary: Family Medicine

## 2020-10-31 ENCOUNTER — Encounter: Admit: 2020-10-31 | Payer: PRIVATE HEALTH INSURANCE | Attending: Nephrology | Primary: Family Medicine

## 2020-10-31 DIAGNOSIS — Z79899 Other long term (current) drug therapy: Secondary | ICD-10-CM

## 2020-10-31 DIAGNOSIS — Z940 Kidney transplant status: Secondary | ICD-10-CM

## 2020-10-31 LAB — BASIC METABOLIC PANEL
BKR BLOOD UREA NITROGEN: 24 mg/dL — ABNORMAL HIGH (ref 6–20)
BKR BUN / CREAT RATIO: 10 (ref 8.0–23.0)
BKR CALCIUM: 9.3 mg/dL (ref 8.8–10.2)
BKR CHLORIDE: 98 mmol/L (ref 98–107)
BKR CO2: 0.5 % (ref 0.0–1.0)
BKR CREATININE: 2.41 mg/dL — ABNORMAL HIGH (ref 0.40–1.30)
BKR EGFR, CREATININE (CKD-EPI 2021): 30 mL/min/{1.73_m2} — ABNORMAL LOW (ref >=60–0.30)
BKR GLUCOSE: 130 mg/dL — ABNORMAL HIGH (ref 70–100)
BKR POTASSIUM: 3 mmol/L — ABNORMAL LOW (ref 3.3–5.3)
BKR SODIUM: 142 mmol/L (ref 136–144)

## 2020-10-31 LAB — CYTOMEGALOVIRUS QUANTITATIVE PCR, PLASMA     (BH GH L LMW YH): BKR CMV QUANTITATIVE PCR: NOT DETECTED

## 2020-10-31 LAB — BK VIRUS BY PCR, QUANTITATIVE, PLASMA: BKR BK VIRUS, QUANTITATIVE PCR, PLASMA: NOT DETECTED

## 2020-10-31 NOTE — Progress Notes
MD unavailable, verbal obtained from Dr. Phylliss Blakes, orders placed.

## 2020-11-07 ENCOUNTER — Encounter: Admit: 2020-11-07 | Payer: PRIVATE HEALTH INSURANCE | Primary: Family Medicine

## 2020-11-07 MED ORDER — TACROLIMUS XR 1 MG TABLET,EXTENDED RELEASE 24 HR
1 mg | ORAL_TABLET | Freq: Every day | ORAL | 12 refills | 90.00 days | Status: AC
Start: 2020-11-07 — End: 2021-11-24
  Filled 2020-11-19: qty 90, 90d supply, fill #0
  Filled 2020-11-19: qty 90, 90d supply, fill #5

## 2020-11-11 ENCOUNTER — Telehealth
Admit: 2020-11-11 | Payer: PRIVATE HEALTH INSURANCE | Attending: Student in an Organized Health Care Education/Training Program | Primary: Family Medicine

## 2020-11-11 DIAGNOSIS — L738 Other specified follicular disorders: Secondary | ICD-10-CM

## 2020-11-11 NOTE — Telephone Encounter
Spanish interpreter ID [id number] I called Adam Harper to notify him of his biopsy results of the right cheek, which showed sebaceous hyperplasia. No further intervention needed. He will return in one year for TBSE. All questions answered. Lucia Gaskins, MD [facility]

## 2020-11-19 MED FILL — TACROLIMUS XR 4 MG TABLET,EXTENDED RELEASE 24 HR: 4 mg | ORAL | 30 days supply | Qty: 60 | Fill #4 | Status: CP

## 2020-11-19 MED FILL — ATOVAQUONE 750 MG/5 ML ORAL SUSPENSION: 750 mg/5 mL | ORAL | 30 days supply | Qty: 300 | Fill #5 | Status: CP

## 2020-11-19 MED FILL — PREDNISONE 5 MG TABLET: 5 mg | ORAL | 30 days supply | Qty: 30 | Fill #5 | Status: CP

## 2020-11-24 ENCOUNTER — Encounter: Admit: 2020-11-24 | Payer: PRIVATE HEALTH INSURANCE | Primary: Family Medicine

## 2020-11-25 MED FILL — MYCOPHENOLATE MOFETIL 250 MG CAPSULE: 250 mg | ORAL | 30 days supply | Qty: 180 | Fill #2 | Status: CP

## 2020-11-26 ENCOUNTER — Encounter: Admit: 2020-11-26 | Payer: PRIVATE HEALTH INSURANCE | Attending: Ophthalmology | Primary: Family Medicine

## 2020-11-26 ENCOUNTER — Ambulatory Visit: Admit: 2020-11-26 | Payer: BLUE CROSS/BLUE SHIELD | Attending: Ophthalmology | Primary: Family Medicine

## 2020-11-26 DIAGNOSIS — I712 Thoracic aortic aneurysm (HC Code): Secondary | ICD-10-CM

## 2020-11-26 DIAGNOSIS — D649 Anemia, unspecified: Secondary | ICD-10-CM

## 2020-11-26 DIAGNOSIS — T827XXA Infection and inflammatory reaction due to other cardiac and vascular devices, implants and grafts, initial encounter: Secondary | ICD-10-CM

## 2020-11-26 DIAGNOSIS — N186 End stage renal disease: Secondary | ICD-10-CM

## 2020-11-26 DIAGNOSIS — N2581 Secondary hyperparathyroidism of renal origin: Secondary | ICD-10-CM

## 2020-11-26 DIAGNOSIS — I10 Essential (primary) hypertension: Secondary | ICD-10-CM

## 2020-11-26 DIAGNOSIS — I770 Arteriovenous fistula, acquired: Secondary | ICD-10-CM

## 2020-11-26 DIAGNOSIS — H25819 Combined forms of age-related cataract, unspecified eye: Secondary | ICD-10-CM

## 2020-11-26 DIAGNOSIS — K7460 Unspecified cirrhosis of liver: Secondary | ICD-10-CM

## 2020-11-26 DIAGNOSIS — E7800 Pure hypercholesterolemia, unspecified: Secondary | ICD-10-CM

## 2020-11-26 DIAGNOSIS — E213 Hyperparathyroidism, unspecified: Secondary | ICD-10-CM

## 2020-11-26 DIAGNOSIS — J101 Influenza due to other identified influenza virus with other respiratory manifestations: Secondary | ICD-10-CM

## 2020-11-26 DIAGNOSIS — E119 Type 2 diabetes mellitus without complications: Secondary | ICD-10-CM

## 2020-11-26 DIAGNOSIS — G4733 Obstructive sleep apnea (adult) (pediatric): Secondary | ICD-10-CM

## 2020-11-26 DIAGNOSIS — K219 Gastro-esophageal reflux disease without esophagitis: Secondary | ICD-10-CM

## 2020-11-26 NOTE — Patient Instructions
Diabetic Retinopathy Having diabetes for a long time, especially if it is not controlled, can damage the light-sensitive membrane at the back of the eye (retina). The disease of the retina caused by diabetes is called diabetic retinopathy. Taking good care of your diabetes helps reduce the risk of developing diabetic retinopathy. Have regular eye exams. Early detection is the key to keeping your eyes healthy. Diabetes can also affect other parts of the eye with vision-threatening results, such as cataracts and a form of glaucoma that is very difficult to treat. SYMPTOMS  In the early stages of diabetic retinopathy, there are often no symptoms. As the condition advances, symptoms may include:  Blurred vision. This may go away when blood glucose (sugar) levels are normal. This type of reversible change in vision is usually due to swelling of the lens.   Moving speck or dark spots (floaters) in your vision. This can be caused by small amounts of blood (hemorrhages) escaping from the blood vessels of the retina.   Missing parts of your field of vision. This can be caused by larger hemorrhages within the tissue of the retina.   Poor night vision.   Poor color vision.   Sudden drop or loss of vision in one eye. This may be caused by a hemorrhage from retinal blood vessels into the cavity of the inside of the eye.  You should not wait until you have symptoms. An eye care specialist can start treatment before visual impairment occurs. DIAGNOSIS  Your eye care specialist can detect the diabetic changes in your blood vessels by putting drops in your eyes to enlarge the size of (dilate) your pupils. This allows a bigger "window" through which the caregiver can see the entire inside of your eyes.  TREATMENT   If you have the type of diabetes that requires you to use insulin, your risk of diabetic retinopathy is very high. You should have your eyes checked at least every 6 months.   If you have diabetes  that was diagnosed during childhood or before the age of 20, your risk of diabetic retinopathy is very high. You should have your eyes checked at least every 6 months.   If you have diabetes that is controlled by diet, you should have the dilated eye exam when first diagnosed and yearly thereafter.   If your diabetes is not under good control as measured by your blood glucose levels and other indicators, it is critical that you have your eyes checked even more often.  Your caregiver can usually see the problems of diabetic retinopathy developing long before it causes a problem. In many cases, it can be treated to prevent complications. HOME CARE INSTRUCTIONS   Keep blood pressure in goal range.   Keep blood glucose in target range.   Follow your caregiver's orders regarding diet and other means for controlling your blood glucose levels.   Check both your urine and blood levels for glucose as recommended by your caregiver.  SEEK MEDICAL CARE IF:   You notice gradual blurring or other changes in your vision over time.   You notice that your glasses or contact lenses do not make things look as sharp as they once did.   You have trouble reading or seeing details at a distance with either eye.  SEEK IMMEDIATE MEDICAL CARE IF:   You notice a sudden change in your vision or parts of your field of vision appear missing or hazy. This may mean you have lost some vision. Get   help right away to prevent further vision loss.   You suddenly see moving specks or dark spots in the field of vision of either eye.   You have a sudden partial or total loss of vision in either eye.  Document Released: 02/27/2000 Document Revised: 02/18/2011 Document Reviewed: 11/20/2008 Exit[employer] Patient Information 2012 [employer]. 

## 2020-11-26 NOTE — Progress Notes
Adam Harper is a 60 y.o. gentleman who presents as new patient. Currently not working.  Spanish interpreter: [id number] Adam Harper has had COVID vaccination.  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  Then schedule Telehealth with Dr. Wallis Mart / Attending Physician Attestation:  Adam Pate, MD, MPHI saw, examined and evaluated the patient, Adam Harper. I discussed clinical findings, testing, management and plan for care with Adam Harper.   I agree with the findings and the plan of care including the interpretation of all imaging studies as documented in the Dr. Osie Cheeks Kibe's note.  Of note Adam Harper has past medical history [id number] Medical History: Diagnosis Date ? A-V fistula (HC Code) (HC CODE) (HC Code)   right arm ? Anemia  ? 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 Overview:  Added automatically from request for surgery [id number] Overview:  Added automatically from request for surgery [id number] ? Anemia in ESRD (end-stage renal disease) (HC Code) 10/24/2014  Added automatically from request for surgery [id number]  Added automatically from request for surgery [id number] Overview:  Added automatically from request for surgery [id number] ? AV fistula infection (HC Code) (HC CODE) (HC Code)  ? Cirrhosis (HC Code) (HC CODE) (HC Code)  ? ESRD on hemodialysis (HC Code) (HC CODE) (HC Code)   Mon-Wed-Fri ? GERD (gastroesophageal reflux disease)  ? Hypercholesteremia  ? Hyperparathyroidism (HC Code) (HC CODE) (HC Code)  ? Hypertension  ? Infected prosthetic vascular graft, initial encounter (HC Code) (HC CODE) (HC Code) 10/24/2014 ? Influenza A  ? Morbid obesity (HC Code) (HC CODE) (HC Code) 02/03/2018 ? Obstructive sleep apnea   pt uses CPAP ? Secondary hyperparathyroidism of renal origin (HC Code) (HC CODE) (HC Code)  ? Thoracic aortic aneurysm [facility] Code)  Lab Results Component Value Date  HGBA1C 7.3 (H) 08/05/2020  HGBA1C 7.1 (H) 06/05/2020  HGBA1C 5.7 (H) 11/05/2019  Past Surgical History: Procedure Laterality Date ? AV FISTULA PLACEMENT Left   left lower arm unsuccessful, then moved to upper arm 2011, then had problems in 10/2013 - infection  Medications:Current Outpatient Medications on File Prior to Visit Medication Sig Dispense Refill ? amLODIPine (NORVASC) 10 mg tablet Take 1 tablet (10 mg total) by mouth every morning. 90 tablet 3 ? atorvastatin (LIPITOR) 40 mg tablet Take 1 tablet (40 mg total) by mouth daily. 90 tablet 3 ? atovaquone (MEPRON) 750 mg/5 mL suspension Take 10 mLs (1,500 mg total) by mouth daily with dinner. 300 mL 11 ? 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 Please dispense glucometer covered by patient's insurance. 1 each 0 ? 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 Please dispense supplies covered by patient's insurance. 100 strip 11 ? carvediloL (COREG) 25 mg Immediate Release tablet Take 1 tablet (25 mg total) by mouth 2 (two) times daily with  breakfast and dinner. 60 tablet 11 ? chlorthalidone (HYGROTEN) 50 mg tablet Take 1 tablet (50 mg total) by mouth daily. 90 tablet 3 ? diclofenac (VOLTAREN) 1 % gel Apply topically 4 (four) times daily. 100 g 2 ? ergocalciferol (VITAMIN D2) 1,250 mcg (50,000 unit) capsule Take 1 capsule (50,000 Units total) by mouth once a week. 12 capsule 0 ? furosemide (LASIX) 40 mg tablet Take 1 tablet (40 mg total) by mouth daily. 30 tablet 4 ? 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 Please dispense supplies covered by patient's insurance. 100 each 11 ? lidocaine maalox diphenhydramine nystatin (MAGIC MOUTHWASH + NYSTATIN) 1:1:1:1 Swish and swallow 10 mLs 4 (four) times daily. (Patient taking differently: Swish and swallow 10 mLs as needed.) 500 mL 0 ? linaGLIPtin (TRADJENTA) 5 mg Tab tablet Take 1 tablet (5 mg total) by mouth daily. 90 tablet 3 ? magnesium oxide (MAG-OX) 400 mg (241.3 mg magnesium) tablet Take 1 tablet (400 mg total) by mouth 2 (two) times daily. 60 tablet 3 ? mycophenolate mofetil (CELLCEPT) 250 mg capsule Take 3 capsules (750 mg total) by mouth 2 (two) times daily. 240 capsule 11 ? omeprazole (PRILOSEC) 20 mg capsule Take 1 capsule (20 mg total) by mouth daily. 90 capsule 3 ? predniSONE (DELTASONE) 5 mg tablet Take 1 tablet (5 mg total) by mouth daily. Take after completion of taper. 30 tablet 11 ? tacrolimus XR (ENVARSUS XR) 1 mg 24 hr tablet Take 1 tablet (1 mg total) by mouth daily. Take 1 mg to make dose to make total 9 mg daily. 90 tablet 11 ? tacrolimus XR (ENVARSUS XR) 4 mg 24 hr tablet Take 2 tablets (8 mg total) by mouth daily. 90 tablet 11 ? urea (CARMOL) 20 % cream Apply topically as needed. 85 g 3 No current facility-administered medications on file prior to visit.  Of note Adam Harper has diabetesDiabetic Retinopathy Having diabetes for a long time, especially if it is not controlled, can damage the light-sensitive membrane at the back of the eye (retina). The disease of the retina caused by diabetes is called diabetic retinopathy. Taking good care of your diabetes helps reduce the risk of developing diabetic retinopathy. Have regular eye exams. Early detection is the key to keeping your eyes healthy. Diabetes can also affect other parts of the eye with vision-threatening results, such as cataracts and a form of glaucoma that is very difficult to treat. SYMPTOMS In the early stages of diabetic retinopathy, there are often no symptoms. As the condition advances, symptoms may include: Blurred vision. This may go away when blood glucose (sugar) levels are normal. This type of reversible change in vision is usually due to swelling of the lens. Moving speck or dark spots (floaters) in your vision. This can be caused by small amounts of blood (hemorrhages) escaping from the blood vessels of the retina. Missing parts of your field of vision. This can be caused by larger hemorrhages within the tissue of the retina. Poor night vision. Poor color vision. Sudden drop or loss of vision in one eye. This may be caused by a hemorrhage from retinal blood vessels into the cavity of the inside of the eye.You should not wait until you have symptoms. An eye care specialist can start treatment before visual impairment occurs. DIAGNOSIS Your eye care specialist can detect the diabetic changes in your blood vessels by putting drops in your eyes to enlarge the size of (dilate) your pupils. This allows a  bigger window through which the caregiver can see the entire inside of your eyes. TREATMENT If you have the type of diabetes that requires you to use insulin, your risk of diabetic retinopathy is very high. You should have your eyes checked at least every 6 months. If you have diabetes that was diagnosed during childhood or before the age of 78, your risk of diabetic retinopathy is very high. You should have your eyes checked at least every 6 months. If you have diabetes that is controlled by diet, you should have the dilated eye exam when first diagnosed and yearly thereafter. If your diabetes is not under good control as measured by your blood glucose levels and other indicators, it is critical that you have your eyes checked even more often.Your caregiver can usually see the problems of diabetic retinopathy developing long before it causes a problem. In many cases, it can be treated to prevent complications. HOME CARE INSTRUCTIONS Keep blood pressure in goal range. Keep blood glucose in target range. Follow your caregiver's orders regarding diet and other means for controlling your blood glucose levels. Check both your urine and blood levels for glucose as recommended by your caregiver.SEEK MEDICAL CARE IF: You notice gradual blurring or other changes in your vision over time. You notice that your glasses or contact lenses do not make things look as sharp as they once did. You have trouble reading or seeing details at a distance with either eye.SEEK IMMEDIATE MEDICAL CARE IF: You notice a sudden change in your vision or parts of your field of vision appear missing or hazy. This may mean you have lost some vision. Get help right away to prevent further vision loss. You suddenly see moving specks or dark spots in the field of vision of either eye. You have a sudden partial or total loss of vision in either eye.Document Released: 02/27/2000 Document Revised: 02/18/2011 Document Reviewed: 11/20/2008 ExitCare? Patient Information ?[address], [place].

## 2020-12-02 ENCOUNTER — Encounter: Admit: 2020-12-02 | Payer: PRIVATE HEALTH INSURANCE | Primary: Family Medicine

## 2020-12-02 ENCOUNTER — Encounter
Admit: 2020-12-02 | Payer: PRIVATE HEALTH INSURANCE | Attending: Student in an Organized Health Care Education/Training Program | Primary: Family Medicine

## 2020-12-02 ENCOUNTER — Ambulatory Visit
Admit: 2020-12-02 | Payer: BLUE CROSS/BLUE SHIELD | Attending: Student in an Organized Health Care Education/Training Program | Primary: Family Medicine

## 2020-12-02 ENCOUNTER — Inpatient Hospital Stay: Admit: 2020-12-02 | Discharge: 2020-12-02 | Payer: PRIVATE HEALTH INSURANCE | Primary: Family Medicine

## 2020-12-02 DIAGNOSIS — K7460 Unspecified cirrhosis of liver: Secondary | ICD-10-CM

## 2020-12-02 DIAGNOSIS — Z940 Kidney transplant status: Secondary | ICD-10-CM

## 2020-12-02 DIAGNOSIS — T827XXA Infection and inflammatory reaction due to other cardiac and vascular devices, implants and grafts, initial encounter: Secondary | ICD-10-CM

## 2020-12-02 DIAGNOSIS — E7800 Pure hypercholesterolemia, unspecified: Secondary | ICD-10-CM

## 2020-12-02 DIAGNOSIS — N2581 Secondary hyperparathyroidism of renal origin: Secondary | ICD-10-CM

## 2020-12-02 DIAGNOSIS — J101 Influenza due to other identified influenza virus with other respiratory manifestations: Secondary | ICD-10-CM

## 2020-12-02 DIAGNOSIS — I770 Arteriovenous fistula, acquired: Secondary | ICD-10-CM

## 2020-12-02 DIAGNOSIS — Z79899 Other long term (current) drug therapy: Secondary | ICD-10-CM

## 2020-12-02 DIAGNOSIS — E213 Hyperparathyroidism, unspecified: Secondary | ICD-10-CM

## 2020-12-02 DIAGNOSIS — I712 Thoracic aortic aneurysm (HC Code): Secondary | ICD-10-CM

## 2020-12-02 DIAGNOSIS — I10 Essential (primary) hypertension: Secondary | ICD-10-CM

## 2020-12-02 DIAGNOSIS — K219 Gastro-esophageal reflux disease without esophagitis: Secondary | ICD-10-CM

## 2020-12-02 DIAGNOSIS — G4733 Obstructive sleep apnea (adult) (pediatric): Secondary | ICD-10-CM

## 2020-12-02 DIAGNOSIS — D649 Anemia, unspecified: Secondary | ICD-10-CM

## 2020-12-02 DIAGNOSIS — N186 End stage renal disease: Secondary | ICD-10-CM

## 2020-12-02 LAB — BASIC METABOLIC PANEL
BKR ANION GAP: 15 g/dL — ABNORMAL LOW (ref 7–17)
BKR BLOOD UREA NITROGEN: 21 mg/dL — ABNORMAL HIGH (ref 6–20)
BKR BUN / CREAT RATIO: 8.9 (ref 8.0–23.0)
BKR CALCIUM: 8.7 mg/dL — ABNORMAL LOW (ref 8.8–10.2)
BKR CHLORIDE: 100 mmol/L (ref 98–107)
BKR CO2: 27 mmol/L (ref 20–30)
BKR CREATININE: 2.35 mg/dL — ABNORMAL HIGH (ref 0.40–1.30)
BKR EGFR, CREATININE (CKD-EPI 2021): 31 mL/min/{1.73_m2} — ABNORMAL LOW (ref >=60)
BKR GLUCOSE: 111 mg/dL — ABNORMAL HIGH (ref 70–100)
BKR POTASSIUM: 3.2 mmol/L — ABNORMAL LOW (ref 3.3–5.3)
BKR SODIUM: 142 mmol/L (ref 136–144)
BKR WAM PLATELETS: 31 mL/min/{1.73_m2} — ABNORMAL LOW (ref >=60–420)

## 2020-12-02 LAB — CBC WITH AUTO DIFFERENTIAL
BKR WAM ABSOLUTE IMMATURE GRANULOCYTES.: 0.04 x 1000/??L (ref 0.00–0.30)
BKR WAM ABSOLUTE LYMPHOCYTE COUNT.: 0.85 x 1000/??L (ref 0.60–3.70)
BKR WAM ABSOLUTE NRBC (2 DEC): 0 x 1000/??L (ref 0.00–1.00)
BKR WAM ANALYZER ANC: 4.66 x 1000/??L (ref 2.00–7.60)
BKR WAM BASOPHIL ABSOLUTE COUNT.: 0.03 x 1000/??L (ref 0.00–1.00)
BKR WAM BASOPHILS: 0.5 % (ref 0.0–1.4)
BKR WAM EOSINOPHIL ABSOLUTE COUNT.: 0.07 x 1000/??L (ref 0.00–1.00)
BKR WAM EOSINOPHILS: 1.1 % (ref 0.0–5.0)
BKR WAM HEMATOCRIT (2 DEC): 40.2 % — ABNORMAL HIGH (ref 38.50–50.00)
BKR WAM HEMOGLOBIN: 13.1 g/dL — ABNORMAL LOW (ref 13.2–17.1)
BKR WAM IMMATURE GRANULOCYTES: 0.6 % (ref 0.0–1.0)
BKR WAM LYMPHOCYTES: 13.4 % — ABNORMAL LOW (ref 17.0–50.0)
BKR WAM MCH (PG): 31.2 pg — ABNORMAL HIGH (ref 27.0–33.0)
BKR WAM MCHC: 32.6 g/dL (ref 31.0–36.0)
BKR WAM MCV: 95.7 fL — ABNORMAL HIGH (ref 80.0–100.0)
BKR WAM MONOCYTE ABSOLUTE COUNT.: 0.67 x 1000/??L (ref 0.00–1.00)
BKR WAM MONOCYTES: 10.6 % (ref 4.0–12.0)
BKR WAM MPV: 10.1 fL (ref 8.0–12.0)
BKR WAM NEUTROPHILS: 73.8 % — ABNORMAL HIGH (ref 39.0–72.0)
BKR WAM NUCLEATED RED BLOOD CELLS: 0 % (ref 0.0–1.0)
BKR WAM RDW-CV: 13.4 % (ref 11.0–15.0)
BKR WAM RED BLOOD CELL COUNT.: 4.2 M/ÂµL (ref 4.00–6.00)
BKR WAM WHITE BLOOD CELL COUNT: 6.3 x1000/??L (ref 4.0–11.0)

## 2020-12-02 LAB — BK VIRUS BY PCR, QUANTITATIVE, PLASMA: BKR BK VIRUS, QUANTITATIVE PCR, PLASMA: NOT DETECTED

## 2020-12-02 LAB — TACROLIMUS LEVEL     (BH GH L LMW YH): BKR TACROLIMUS BLOOD: 8.6 ng/mL

## 2020-12-02 LAB — PHOSPHORUS     (BH GH L LMW YH): BKR PHOSPHORUS: 3.7 mg/dL (ref 2.2–4.5)

## 2020-12-02 LAB — MAGNESIUM: BKR MAGNESIUM: 1.7 mg/dL (ref 1.7–2.4)

## 2020-12-02 NOTE — Progress Notes
MD unavailable, verbal obtained from Dr. Smith Robert, orders placed.

## 2020-12-03 LAB — CYTOMEGALOVIRUS QUANTITATIVE PCR, PLASMA     (BH GH L LMW YH): BKR CMV QUANTITATIVE PCR: NOT DETECTED

## 2020-12-05 ENCOUNTER — Ambulatory Visit: Admit: 2020-12-05 | Payer: MEDICAID | Primary: Family Medicine

## 2020-12-14 ENCOUNTER — Encounter: Admit: 2020-12-14 | Payer: PRIVATE HEALTH INSURANCE | Primary: Family Medicine

## 2020-12-18 MED FILL — ATOVAQUONE 750 MG/5 ML ORAL SUSPENSION: 750 mg/5 mL | ORAL | 30 days supply | Qty: 300 | Fill #6 | Status: CP

## 2020-12-18 MED FILL — PREDNISONE 5 MG TABLET: 5 mg | ORAL | 30 days supply | Qty: 30 | Fill #6 | Status: CP

## 2020-12-18 MED FILL — CARVEDILOL IMMEDIATE RELEASE 25 MG TABLET: 25 mg | ORAL | 30 days supply | Qty: 60 | Fill #1 | Status: CP

## 2020-12-18 MED FILL — TACROLIMUS XR 4 MG TABLET,EXTENDED RELEASE 24 HR: 4 mg | ORAL | 30 days supply | Qty: 60 | Fill #5 | Status: CP

## 2020-12-18 MED FILL — MAGNESIUM OXIDE 400 MG (241.3 MG MAGNESIUM) TABLET: 400 mg (241.3 mg magnesium) | ORAL | 30 days supply | Qty: 60 | Fill #0 | Status: CP

## 2020-12-19 ENCOUNTER — Ambulatory Visit: Admit: 2020-12-19 | Payer: BLUE CROSS/BLUE SHIELD | Attending: "Endocrinology | Primary: Family Medicine

## 2020-12-19 ENCOUNTER — Inpatient Hospital Stay: Admit: 2020-12-19 | Discharge: 2020-12-19 | Payer: PRIVATE HEALTH INSURANCE | Primary: Family Medicine

## 2020-12-19 DIAGNOSIS — E119 Type 2 diabetes mellitus without complications: Secondary | ICD-10-CM

## 2020-12-19 DIAGNOSIS — Z940 Kidney transplant status: Secondary | ICD-10-CM

## 2020-12-19 DIAGNOSIS — Z79899 Other long term (current) drug therapy: Secondary | ICD-10-CM

## 2020-12-19 DIAGNOSIS — E785 Hyperlipidemia, unspecified: Secondary | ICD-10-CM

## 2020-12-19 LAB — CBC WITH AUTO DIFFERENTIAL
BKR WAM ABSOLUTE IMMATURE GRANULOCYTES.: 0.03 x 1000/??L (ref 0.00–0.30)
BKR WAM ABSOLUTE LYMPHOCYTE COUNT.: 0.78 x 1000/??L (ref 0.60–3.70)
BKR WAM ABSOLUTE NRBC (2 DEC): 0 x 1000/??L (ref 0.00–1.00)
BKR WAM ANALYZER ANC: 5.25 x 1000/??L (ref 2.00–7.60)
BKR WAM BASOPHIL ABSOLUTE COUNT.: 0.02 x 1000/??L (ref 0.00–1.00)
BKR WAM BASOPHILS: 0.3 % (ref 0.0–1.4)
BKR WAM EOSINOPHIL ABSOLUTE COUNT.: 0.06 x 1000/??L (ref 0.00–1.00)
BKR WAM EOSINOPHILS: 0.9 % (ref 0.0–5.0)
BKR WAM HEMOGLOBIN: 13.3 g/dL (ref 13.2–17.1)
BKR WAM IMMATURE GRANULOCYTES: 0.4 % (ref 0.0–1.0)
BKR WAM LYMPHOCYTES: 11.6 % — ABNORMAL LOW (ref 17.0–50.0)
BKR WAM MCH (PG): 31.7 pg — ABNORMAL HIGH (ref 27.0–33.0)
BKR WAM MCHC: 33.3 g/dL (ref 31.0–36.0)
BKR WAM MCV: 95.2 fL (ref 80.0–100.0)
BKR WAM MONOCYTE ABSOLUTE COUNT.: 0.56 x 1000/??L (ref 0.00–1.00)
BKR WAM MONOCYTES: 8.4 % (ref 4.0–12.0)
BKR WAM MPV: 10.4 fL (ref 8.0–12.0)
BKR WAM NEUTROPHILS: 78.4 % — ABNORMAL HIGH (ref 39.0–72.0)
BKR WAM NUCLEATED RED BLOOD CELLS: 0 % (ref 0.0–1.0)
BKR WAM PLATELETS: 223 x1000/??L — ABNORMAL LOW (ref 150–420)
BKR WAM RDW-CV: 13.5 % (ref 11.0–15.0)
BKR WAM RED BLOOD CELL COUNT.: 4.2 M/ÂµL (ref 4.00–6.00)
BKR WAM WHITE BLOOD CELL COUNT: 6.7 x1000/ÂµL (ref 4.0–11.0)

## 2020-12-19 LAB — BASIC METABOLIC PANEL
BKR ANION GAP: 15 g/dL (ref 7–17)
BKR BLOOD UREA NITROGEN: 17 mg/dL (ref 6–20)
BKR BUN / CREAT RATIO: 6.6 % — ABNORMAL LOW (ref 8.0–23.0)
BKR CALCIUM: 9.1 mg/dL (ref 8.8–10.2)
BKR CHLORIDE: 99 mmol/L (ref 98–107)
BKR CO2: 29 mmol/L (ref 20–30)
BKR CREATININE: 2.56 mg/dL — ABNORMAL HIGH (ref 0.40–1.30)
BKR EGFR, CREATININE (CKD-EPI 2021): 28 mL/min/{1.73_m2} — ABNORMAL LOW (ref >=60)
BKR GLUCOSE: 102 mg/dL — ABNORMAL HIGH (ref 70–100)
BKR POTASSIUM: 3.4 mmol/L (ref 3.3–5.3)
BKR SODIUM: 143 mmol/L (ref 136–144)
BKR WAM HEMATOCRIT (2 DEC): 102 mg/dL — ABNORMAL HIGH (ref 70–100)

## 2020-12-19 LAB — TACROLIMUS LEVEL     (BH GH L LMW YH): BKR TACROLIMUS BLOOD: 7.1 ng/mL

## 2020-12-19 LAB — MAGNESIUM: BKR MAGNESIUM: 1.6 mg/dL — ABNORMAL LOW (ref 1.7–2.4)

## 2020-12-19 LAB — PHOSPHORUS     (BH GH L LMW YH): BKR PHOSPHORUS: 3.4 mg/dL (ref 2.2–4.5)

## 2020-12-19 MED ORDER — TRULICITY 1.5 MG/0.5 ML SUBCUTANEOUS PEN INJECTOR
1.50.5 mg/0.5 mL | SUBCUTANEOUS | 12 refills | Status: AC
Start: 2020-12-19 — End: 2021-04-08

## 2020-12-19 MED ORDER — TRULICITY 0.75 MG/0.5 ML SUBCUTANEOUS PEN INJECTOR
0.75 mg/0.5 mL | SUBCUTANEOUS | 1 refills | Status: AC
Start: 2020-12-19 — End: ?

## 2020-12-19 NOTE — Patient Instructions
We will give you instructions about how to use an insulin pen because trulicity pens and insulin pens work the same. Stop the tradgenta and start on trucility. Start on .75mg  of trucility once weekly for 4 weeks. If you are feeling good you can go up to the 1.5 mg dose for another 4 weeks. If you have any side effects like nausea or stomach pain call the clinic and let us know

## 2020-12-19 NOTE — Progress Notes
Adam Harper returns to the [facility] for a f/u visit for DM management. He is 60 y.o. 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  Pt denies Hx of DM, but A1c in the diabetic range since 05/2020 (pre-diabetes at time of TXPL). He was diagnosed with DM 6 months ago (05/2020).  He has no complications from diabetes.Visit translated by Spanish interpreter (ID # [id number])EA Regimen: Tradjenta 5 mg qdHypoglycemia: n/aSMBG: Checking 1 x/week. Per glucose meter: 120's (114-146). Exercise: walking.Review of Systems Constitutional: Negative for fever. Eyes: Negative for visual disturbance. Respiratory: Negative for cough or shortness of breath.Cardiovascular: Negative for chest pain. Denies abdominal pain, N/V.Neurological: Negative for numbness. Lower ext: positive for edema. No lesions.Current Outpatient Medications Medication Sig ? amLODIPine (NORVASC) 10 mg tablet Take 1 tablet (10 mg total) by mouth every morning. ? atorvastatin (LIPITOR) 40 mg tablet Take 1 tablet (40 mg total) by mouth daily. ? atovaquone (MEPRON) 750 mg/5 mL suspension Take 10 mLs (1,500 mg total) by mouth daily with dinner. ? 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 Please dispense glucometer covered by patient's insurance. ? 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 Please dispense supplies covered by patient's insurance. ? carvediloL (COREG) 25 mg Immediate Release tablet Take 1 tablet (25 mg total) by mouth 2 (two) times daily with breakfast and dinner. ? chlorthalidone (HYGROTEN) 50 mg tablet Take 1 tablet (50 mg total) by mouth daily. ? diclofenac (VOLTAREN) 1 % gel Apply topically 4 (four) times daily. ? ergocalciferol (VITAMIN D2) 1,250 mcg (50,000 unit) capsule Take 1 capsule (50,000 Units total) by mouth once a week. (Patient not taking: Reported on 12/02/2020) ? furosemide (LASIX) 40 mg tablet Take 1 tablet (40 mg total) by mouth daily. ? 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 Please dispense supplies covered by patient's insurance. ? lidocaine maalox diphenhydramine nystatin (MAGIC MOUTHWASH + NYSTATIN) 1:1:1:1 Swish and swallow 10 mLs 4 (four) times daily. (Patient not taking: Reported on 12/02/2020) ? linaGLIPtin (TRADJENTA) 5 mg Tab tablet Take 1 tablet (5 mg total) by mouth daily. ? magnesium oxide (MAG-OX) 400 mg (241.3 mg magnesium) tablet Take 1 tablet (400 mg total) by mouth 2 (two) times daily. ? mycophenolate mofetil (CELLCEPT) 250 mg capsule Take 3 capsules (750 mg total) by mouth 2 (two) times daily. ? omeprazole (PRILOSEC) 20 mg capsule Take 1 capsule (20 mg total) by mouth daily. ? predniSONE (DELTASONE) 5 mg tablet Take 1 tablet (5 mg total) by mouth daily. Take after completion of taper. ? tacrolimus XR (ENVARSUS XR) 1 mg 24 hr tablet Take 1 tablet (1 mg total) by mouth daily. Take 1 mg to make dose to make total 9 mg daily. ? tacrolimus XR (ENVARSUS XR) 4 mg 24 hr tablet Take 2 tablets (8 mg total) by mouth daily. ? urea (CARMOL) 20 % cream Apply topically as needed. No current facility-administered medications for this visit. Patient Active Problem List Diagnosis ? FSGS (focal segmental glomerulosclerosis) ? Hyperparathyroidism (HC Code) (HC CODE) (HC Code) ? ROD (renal osteodystrophy) ? Hypertension ? Hypertriglyceridemia ? Obese ? 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 Colon polyps ? Internal hemorrhoids ? Mediastinal mass ? Arteriovenous fistula stenosis, subsequent encounter ? AVF (  arteriovenous fistula) (HC Code) ? Alkaline phosphatase raised ? Decreased testosterone level ? Gastroesophageal reflux disease ? Lipoprotein deficiency disorder ? Newborn affected by abnormality in fetal (intrauterine) heart rate or rhythm, unspecified as to time of onset ? Deceased-donor kidney transplant recipient ? Immunosuppressive management encounter following kidney transplant ? AKI (acute kidney injury) (HC Code) (HC CODE) (HC Code) ? Kidney transplant complication ? Kidney replaced by transplant ? 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 Combined form of senile cataract, unspecified laterality No Known AllergiesSocial Hx:  reports that he has quit smoking. His smoking use included cigarettes. He has a 6.25 pack-year smoking history. He has never used smokeless tobacco. He reports that he does not drink alcohol and does not use drugs.No family history on file.Vital SignsBP (!) 153/77 (Site: l a, Position: Sitting, Cuff Size: Large)  - Pulse 80  - Temp 97.8 ?F (36.6 ?C) (Temporal)  - Ht 5' 8 (1.727 m)  - Wt 123.4 kg  - SpO2 95%  - BMI 41.36 kg/m? Wt Readings from Last 3 Encounters: 12/08/20 125 kg 12/02/20 124.3 kg 10/28/20 126.1 kg  Physical Exam Pt is in good spirits. Constitutional:  oriented to person, place, and time.  Psychiatric: Normal mood and affect.Pertinent Labs/Imaging:Lab Results Component Value Date  WBC 6.3 12/02/2020  HGB 13.1 (L) 12/02/2020  HCT 40.20 12/02/2020  PLT 212 12/02/2020  NA 142 12/02/2020  K 3.2 (L) 12/02/2020  CL 100 12/02/2020  CREATININE 2.35 (H) 12/02/2020  CALCIUM 8.7 (L) 12/02/2020  MG 1.7 12/02/2020  CHOL 137 08/05/2020  TRIG 209 (H) 08/05/2020  HDL 36 (L) 08/05/2020  LDL 59 08/05/2020  ALT 16 09/81/1914  AST 15 11/18/2019  TSH 2.080 06/05/2020  GLU 111 (H) 12/02/2020  HGBA1C 7.3 (H) 08/05/2020 Lab Results Component Value Date  HGBA1C 7.3 (H) 08/05/2020  HGBA1C 7.1 (H) 06/05/2020  HGBA1C 5.7 (H) 11/05/2019 Impression: 60 y.o. 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  Was trending up when last checked. Will check A1c and if it remains above target (>6.5%) will start GLP-1 agonist (pt already given instruction about Trulicity).Tradjenta - stopStart Trulicity 0.75 mg weekly- SMBG weekly.- A1c today and prior to next visit.2) Hyperlipidemia:  LDL=59. On atorvastatin 40 mgqd.3) Blood Pressure: above target. Pt reports BP at goal at home.4) Eyes: eye exam (11/26/2020): no DR.5) Neuropathy: monofilament test exam (09/2020): normal sensation. I saw and evaluated the patient and discussed management with APRN student Juanetta Beets Hoopes. I reviewed the student?s note and agree with the documented findings and plan of care.

## 2020-12-20 NOTE — Progress Notes
Adam Harper is seen in consultation at the request of Dr. Referring for evaluation and management of his DM. He is 60 y.o. with PMHx of ESRD, s/p kidney transplant in 2021.  He was diagnosed with PTDM  S/p transplant.  Pt is not taking the vitamin D because it never got refilledDM Regimen: tradjenta 5 mg daily Hypoglycemia: noneSMBG: Checking  About once a week10/7 AM 1299/30 AM 1149/29 AM 1159/3 AM 1469/2 AM 1228/27 AM 1278/25 AM 1308/24 AM 133Exercise: walks Review of Systems Constitutional: Negative for fever, appetite change and fatigue. Weight changeEyes: Negative for visual disturbance. Respiratory: Negative for cough, shortness of breath and wheezing. Cardiovascular: Negative for chest pain. Denies abdominal painEndocrine: Negative for polydipsia, polyphagia and polyuria. Genitourinary: Negative for dysuria and frequency. Neurological: Negative for numbness. Lower ext: Denies edema, no lesionsCurrent Outpatient Medications Medication Sig ? amLODIPine (NORVASC) 10 mg tablet Take 1 tablet (10 mg total) by mouth every morning. ? atorvastatin (LIPITOR) 40 mg tablet Take 1 tablet (40 mg total) by mouth daily. ? atovaquone (MEPRON) 750 mg/5 mL suspension Take 10 mLs (1,500 mg total) by mouth daily with dinner. ? 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 Please dispense glucometer covered by patient's insurance. ? 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 Please dispense supplies covered by patient's insurance. ? carvediloL (COREG) 25 mg Immediate Release tablet Take 1 tablet (25 mg total) by mouth 2 (two) times daily with breakfast and dinner. ? chlorthalidone (HYGROTEN) 50 mg tablet Take 1 tablet (50 mg total) by mouth daily. ? diclofenac (VOLTAREN) 1 % gel Apply topically 4 (four) times daily. ? ergocalciferol (VITAMIN D2) 1,250 mcg (50,000 unit) capsule Take 1 capsule (50,000 Units total) by mouth once a week. (Patient not taking: Reported on 12/02/2020) ? furosemide (LASIX) 40 mg tablet Take 1 tablet (40 mg total) by mouth daily. ? 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 Please dispense supplies covered by patient's insurance. ? lidocaine maalox diphenhydramine nystatin (MAGIC MOUTHWASH + NYSTATIN) 1:1:1:1 Swish and swallow 10 mLs 4 (four) times daily. (Patient not taking: Reported on 12/02/2020) ? linaGLIPtin (TRADJENTA) 5 mg Tab tablet Take 1 tablet (5 mg total) by mouth daily. ? magnesium oxide (MAG-OX) 400 mg (241.3 mg magnesium) tablet Take 1 tablet (400 mg total) by mouth 2 (two) times daily. ? mycophenolate mofetil (CELLCEPT) 250 mg capsule Take 3 capsules (750 mg total) by mouth 2 (two) times daily. ? omeprazole (PRILOSEC) 20 mg capsule Take 1 capsule (20 mg total) by mouth daily. ? predniSONE (DELTASONE) 5 mg tablet Take 1 tablet (5 mg total) by mouth daily. Take after completion of taper. ? tacrolimus XR (ENVARSUS XR) 1 mg 24 hr tablet Take 1 tablet (1 mg total) by mouth daily. Take 1 mg to make dose to make total 9 mg daily. ? tacrolimus XR (ENVARSUS XR) 4 mg 24 hr tablet Take 2 tablets (8 mg total) by mouth daily. ? urea (CARMOL) 20 % cream Apply topically as needed. No current facility-administered medications for this visit. Patient Active Problem List Diagnosis ? FSGS (focal segmental glomerulosclerosis) ? Hyperparathyroidism (HC Code) (HC CODE) (HC Code) ? ROD (renal osteodystrophy) ? Hypertension ? Hypertriglyceridemia ? Obese ? 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 Colon polyps ? Internal hemorrhoids ? Mediastinal mass ? Arteriovenous fistula stenosis,  subsequent encounter ? AVF (arteriovenous fistula) (HC Code) ? Alkaline phosphatase raised ? Decreased testosterone level ? Gastroesophageal reflux disease ? Lipoprotein deficiency disorder ? Newborn affected by abnormality in fetal (intrauterine) heart rate or rhythm, unspecified as to time of onset ? Deceased-donor kidney transplant recipient ? Immunosuppressive management encounter following kidney transplant ? AKI (acute kidney injury) (HC Code) (HC CODE) (HC Code) ? Kidney transplant complication ? Kidney replaced by transplant ? 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 Combined form of senile cataract, unspecified laterality No Known AllergiesSocial Hx:  reports that he has quit smoking. His smoking use included cigarettes. He has a 6.25 pack-year smoking history. He has never used smokeless tobacco. He reports that he does not drink alcohol and does not use drugs.No family history on file.Vital SignsBP (!) 153/77 (Site: l a, Position: Sitting, Cuff Size: Large)  - Pulse 80  - Temp 97.8 ?F (36.6 ?C) (Temporal)  - Ht 5' 8 (1.727 m)  - Wt 123.4 kg  - SpO2 95%  - BMI 41.36 kg/m? Wt Readings from Last 3 Encounters: 12/19/20 123.4 kg 12/08/20 125 kg 12/02/20 124.3 kg  Physical Exam Pt is in good spirits. Constitutional:  oriented to person, place, and time.  Appears well-nourished. HENT: Head: Normocephalic. Eyes: Pupils are equal, round, and reactive to light. Neck: No thyromegaly present.  No cervical lymphadenopathy.Pulmonary/Chest: Breath sounds normal.Cardiovascular: Normal rate.  No murmurs. Abdominal: Soft.  Exhibits no distension. There is no tenderness.  Neurological: Non-focalFoot Exam: There is no deformity or skin breakdown. Psychiatric: Normal mood and affect.Pertinent Labs/Imaging:Lab Results Component Value Date  WBC 6.3 12/02/2020  HGB 13.1 (L) 12/02/2020  HCT 40.20 12/02/2020  PLT 212 12/02/2020  NA 142 12/02/2020  K 3.2 (L) 12/02/2020  CL 100 12/02/2020 CREATININE 2.35 (H) 12/02/2020  CALCIUM 8.7 (L) 12/02/2020  MG 1.7 12/02/2020  CHOL 137 08/05/2020  TRIG 209 (H) 08/05/2020  HDL 36 (L) 08/05/2020  LDL 59 08/05/2020  ALT 16 78/29/5621  AST 15 11/18/2019  TSH 2.080 06/05/2020  GLU 111 (H) 12/02/2020  HGBA1C 7.3 (H) 08/05/2020 Lab Results Component Value Date  HGBA1C 7.3 (H) 08/05/2020  HGBA1C 7.1 (H) 06/05/2020  HGBA1C 5.7 (H) 11/05/2019 Impression: 60 y.o. male with PMHx of ESRD ISO HTN, was on dialysis for 10 years and received a kidney TXPL in 08/21, diagnosed w/PTDM s/p TXPL, pt has not had any complications from diabetes Plan: # Diabetes: 1) Glucose: most recent A1C is from 5/22 will recheck before the next aptMetformin contraindicated d/t kidney function Pt educated on benefits of trucility, pt agreed to try it2) Hyperlipidemia:  Trigs slightly elevated 3) Blood Pressure: above target in office, pt reports home readings to be WDL4) Eyes: last eye exam: 9/225) Neuropathy: monofilament test done last visit by Dr. Dinah Beers 6) Smoking: denies.7) On aspirin: no Pt to recheck A1C today and again in 3 months before FU aptPt seen and examined w/ Dr. Dinah Beers Interpreter [phone]

## 2020-12-21 LAB — HEMOGLOBIN A1C
BKR ESTIMATED AVERAGE GLUCOSE: 143 mg/dL
BKR HEMOGLOBIN A1C: 6.6 % — ABNORMAL HIGH (ref 4.0–5.6)

## 2020-12-22 ENCOUNTER — Encounter: Admit: 2020-12-22 | Payer: PRIVATE HEALTH INSURANCE | Attending: "Endocrinology | Primary: Family Medicine

## 2020-12-22 LAB — BK VIRUS BY PCR, QUANTITATIVE, PLASMA: BKR BK VIRUS, QUANTITATIVE PCR, PLASMA: NOT DETECTED % — ABNORMAL HIGH (ref 39.0–72.0)

## 2020-12-23 LAB — HLA POST TRANSPLANT DSA (YMG)
HLA CLASS I ANTIBODY TEST RESULT (S.A.): NEGATIVE
HLA CLASS II ANTIBODY TEST RESULT (S.A.): NEGATIVE

## 2020-12-23 LAB — CYTOMEGALOVIRUS QUANTITATIVE PCR, PLASMA     (BH GH L LMW YH): BKR CMV QUANTITATIVE PCR: NOT DETECTED % (ref 0.0–1.4)

## 2020-12-24 ENCOUNTER — Inpatient Hospital Stay: Admit: 2020-12-24 | Discharge: 2020-12-24 | Payer: PRIVATE HEALTH INSURANCE | Primary: Family Medicine

## 2020-12-24 DIAGNOSIS — Z940 Kidney transplant status: Secondary | ICD-10-CM

## 2020-12-24 NOTE — Patient Instructions
FOLLOWING YOUR KIDNEY BIOPSY TODAY-- Avoid strenuous exercise for the next 3 days.-- Avoid heavy lifting (no more than 10 lbs) for the next 3 days.-- You may take a bath or shower after 24 hrs.-- Please keep the flexible fabric bandage on for 2 days.  Remove thereafter.-- Continue your diet without any changes.-- Resume all previous medications (see note below).-- Discuss with [place] restarting any anticoagulants you may have been holding (i.e. aspirin, Plavix, -- Coumadin, warfarin, etc.).CONTACT OUR OFFICE AT [phone] IF:-- You feel weak or dizzy.-- You have a fever (temperature over 100.4)-- You have shaking chills-- You have blood in your urine.-- You have redness at the biopsy siteSEEK CARE IMMEDIATELY IF:-- You are only urinating small amounts or not at all.-- You have very bad pain in your abdomen or where your procedure was done.-- You suddenly have chest pain or trouble breathing (call 911)_____________________________		____________________________Patient Signature				Nurse Signature_____________________________		_____________________________Date						Patient?s preferred phone#

## 2020-12-24 NOTE — Progress Notes
Patient consented for CF-DNA pIBD study, for single time collection of blood and urine. Study IRB ID: [id number] Patient study ID: [id number] given to patient and scanned in chart

## 2020-12-24 NOTE — Progress Notes
U/S Guided Percutaneous Kidney Biopsy performed by Dr. Phylliss Blakes.  Invasive Procedure checklist ,Permission obtained and time out done prior to biopsy.  Patient tolerated procedure well.  Patient with sandbag on for 2 hours and off for an hour.  No blood in urine noted.  Discharge instruction given and verbalized understanding. Bloods drawn prior to infusion.

## 2020-12-26 MED FILL — MYCOPHENOLATE MOFETIL 250 MG CAPSULE: 250 mg | ORAL | 30 days supply | Qty: 180 | Fill #3 | Status: CP

## 2021-01-05 ENCOUNTER — Emergency Department: Admit: 2021-01-05 | Payer: PRIVATE HEALTH INSURANCE | Primary: Family Medicine

## 2021-01-05 ENCOUNTER — Inpatient Hospital Stay: Admit: 2021-01-05 | Discharge: 2021-01-06 | Payer: PRIVATE HEALTH INSURANCE | Attending: Emergency Medicine

## 2021-01-05 DIAGNOSIS — M25562 Pain in left knee: Secondary | ICD-10-CM

## 2021-01-05 MED ORDER — LIDOCAINE 5 % ADHESIVE PATCH
5 % | TRANSDERMAL | Status: DC
Start: 2021-01-05 — End: 2021-01-06

## 2021-01-05 MED ORDER — ACETAMINOPHEN 325 MG TABLET
325 mg | Freq: Once | ORAL | Status: CP
Start: 2021-01-05 — End: ?
  Administered 2021-01-06: 03:00:00 325 mg via ORAL

## 2021-01-06 DIAGNOSIS — Z992 Dependence on renal dialysis: Secondary | ICD-10-CM

## 2021-01-06 DIAGNOSIS — E1122 Type 2 diabetes mellitus with diabetic chronic kidney disease: Secondary | ICD-10-CM

## 2021-01-06 DIAGNOSIS — I120 Hypertensive chronic kidney disease with stage 5 chronic kidney disease or end stage renal disease: Secondary | ICD-10-CM

## 2021-01-06 DIAGNOSIS — Z79899 Other long term (current) drug therapy: Secondary | ICD-10-CM

## 2021-01-06 DIAGNOSIS — K219 Gastro-esophageal reflux disease without esophagitis: Secondary | ICD-10-CM

## 2021-01-06 DIAGNOSIS — E7800 Pure hypercholesterolemia, unspecified: Secondary | ICD-10-CM

## 2021-01-06 DIAGNOSIS — G8929 Other chronic pain: Secondary | ICD-10-CM

## 2021-01-06 DIAGNOSIS — N186 End stage renal disease: Secondary | ICD-10-CM

## 2021-01-06 DIAGNOSIS — Z7985 Long-term (current) use of injectable non-insulin antidiabetic drugs: Secondary | ICD-10-CM

## 2021-01-06 DIAGNOSIS — Z87891 Personal history of nicotine dependence: Secondary | ICD-10-CM

## 2021-01-06 DIAGNOSIS — Z940 Kidney transplant status: Secondary | ICD-10-CM

## 2021-01-06 DIAGNOSIS — Z7952 Long term (current) use of systemic steroids: Secondary | ICD-10-CM

## 2021-01-06 DIAGNOSIS — Z9989 Dependence on other enabling machines and devices: Secondary | ICD-10-CM

## 2021-01-06 DIAGNOSIS — G4733 Obstructive sleep apnea (adult) (pediatric): Secondary | ICD-10-CM

## 2021-01-06 DIAGNOSIS — K7460 Unspecified cirrhosis of liver: Secondary | ICD-10-CM

## 2021-01-06 MED ORDER — LIDOCAINE 5 % ADHESIVE PATCH
5 % | MEDICATED_PATCH | TRANSDERMAL | 1 refills | Status: AC
Start: 2021-01-06 — End: 2021-01-06

## 2021-01-06 MED ORDER — LIDOCAINE 5 % ADHESIVE PATCH
5 % | MEDICATED_PATCH | TRANSDERMAL | 1 refills | Status: AC
Start: 2021-01-06 — End: ?

## 2021-01-06 NOTE — ED Provider Notes
HistoryChief Complaint Patient presents with ? Knee Pain   Pt presents c/o left knee pain, pt reports pain has been ongoing for more than a month reports symptoms worsened today, pt reports took tylenol w/ minimal relief, pt reports ambulatory. Pt denies any recent injury or trauma.   Patient is a 60 year old male with a past medical history significant for diabetes, hypertension, cirrhosis, end stage renal disease s/p transplant who presents to the emergency department for left knee pain. States the pain has been increasing in severity over the last several months however family member at bedside showed me an image on her phone of a fall he had today. Denies hitting head or loss of consciousness, denies dizziness prior to fall. States he just had significant pain and legs gave out. When asked about it patient states he had so much pain his legs gave out.  States the pain is about 8/10 and did not have relief with Tylenol. States he is unable to take NSAIDS secondary to kidney dysfunction.  States he has been walking with extreme pain. Denies prior injury to this knee. Has never had surgery on this knee either.  Denies allergies at this time. The history is provided by the patient. Knee PainLocation:  KneeTime since incident:  2 monthsInjury: no  Knee location:  L kneePain details:   Quality:  Shooting and throbbing  Radiates to:  Does not radiate  Severity:  Moderate  Onset quality:  Sudden  Duration:  3 days  Timing:  Constant  Progression:  Waxing and waningChronicity:  ChronicPrior injury to area:  NoRelieved by:  NothingWorsened by:  Bearing weightIneffective treatments:  AcetaminophenAssociated symptoms: no back pain, no decreased ROM, no fatigue and no fever   Past Medical History: Diagnosis Date ? A-V fistula (HC Code) (HC CODE) (HC Code)   right arm ? Anemia  ? 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 Overview:  Added automatically from request for surgery [id number] Overview:  Added automatically from request for surgery [id number] ? Anemia in ESRD (end-stage renal disease) (HC Code) 10/24/2014  Added automatically from request for surgery [id number]  Added automatically from request for surgery [id number] Overview:  Added automatically from request for surgery [id number] ? AV fistula infection (HC Code) (HC CODE) (HC Code)  ? Cirrhosis (HC Code) (HC CODE) (HC Code)  ? ESRD on hemodialysis (HC Code) (HC CODE) (HC Code)   Mon-Wed-Fri ? GERD (gastroesophageal reflux disease)  ? Hypercholesteremia  ? Hyperparathyroidism (HC Code) (HC CODE) (HC Code)  ? Hypertension  ? Infected prosthetic vascular graft, initial encounter (HC Code) (HC CODE) (HC Code) 10/24/2014 ? Influenza A  ? Morbid obesity (HC Code) (HC CODE) (HC Code) 02/03/2018 ? Obstructive sleep apnea   pt uses CPAP ? Secondary hyperparathyroidism of renal origin (HC Code) (HC CODE) (HC Code)  ? Thoracic aortic aneurysm (HC Code)  Past Surgical History: Procedure Laterality Date ? AV FISTULA PLACEMENT Left   left lower arm unsuccessful, then moved to upper arm 2011, then had problems in 10/2013 - infection No family history on file.Social History Socioeconomic History ? Marital status: Married Tobacco Use ? Smoking status: Former Smoker   Packs/day: 0.25   Years: 25.00   Pack years: 6.25   Types: Cigarettes ? Smokeless tobacco: Never Used Vaping Use ? Vaping Use: Never used Substance and Sexual Activity ? Alcohol use: No ? Drug use: No ? Sexual activity: Yes   Partners:  Female ED Other Social History ? E-cigarette status Never User  ? E-Cigarette Use Never User  E-cigarette/Vaping Substances E-cigarette/Vaping Devices Review of Systems Constitutional: Negative for chills, fatigue and fever. Respiratory: Negative for chest tightness and shortness of breath.  Cardiovascular: Negative for chest pain. Gastrointestinal: Negative for constipation, diarrhea, nausea and vomiting. Musculoskeletal: Negative for back pain. Neurological: Negative for dizziness and headaches. All other systems reviewed and are negative. Physical ExamED Triage Vitals [01/05/21 1951]BP: (!) 149/72Pulse: 70Pulse from  O2 sat: n/aResp: 16Temp: 98.5 ?F (36.9 ?C)Temp src: OralSpO2: 96 % BP (!) 149/72  - Pulse 70  - Temp 98.5 ?F (36.9 ?C) (Oral)  - Resp 16  - SpO2 96% Physical ExamVitals and nursing note reviewed. Constitutional:     General: He is not in acute distress.   Appearance: Normal appearance. He is obese. He is not ill-appearing, toxic-appearing or diaphoretic. Eyes:    Extraocular Movements: Extraocular movements intact.    Conjunctiva/sclera: Conjunctivae normal. Cardiovascular:    Rate and Rhythm: Normal rate and regular rhythm.    Pulses: Normal pulses.    Heart sounds: Normal heart sounds. Pulmonary:    Effort: Pulmonary effort is normal. No respiratory distress.    Breath sounds: Normal breath sounds. No wheezing. Musculoskeletal:       General: Tenderness present. No deformity.    Cervical back: Normal range of motion and neck supple. Skin:   General: Skin is warm and dry. Neurological:    General: No focal deficit present.    Mental Status: He is alert and oriented to person, place, and time. Mental status is at baseline. Psychiatric:       Mood and Affect: Mood normal.       Behavior: Behavior normal.       Thought Content: Thought content normal.       Judgment: Judgment normal.  ProceduresProceduresResident/APP MDM:MDM:Ddx: knee dislocation, fracture of patella, joint effusion, ligamentous tear Plan:-CXR-Xray left knee10:09 PMWill manage pain with Tylenol and then place Lidoderm patch over left knee. Dispo pending X-ray results. 11:04 PMCXR: IMPRESSION: No acute cardiothoracic abnormality.Xray Knee: IMPRESSION:Well-corticated ossific density adjacent to the tibial spine may reflect an age-indeterminate avulsion fragment. A moderate suprapatellar joint effusion is also suggested. No acute fracture or dislocation is otherwise radiographically appreciated.11:18 PMTransitioned care to Dr. Ollen Barges at this time. ED CourseComments as of 01/06/21 1625 Mon Jan 05, 2021 2318 I have received sign out on this Pt. Pt is a 60 y.o. male with hx of ESRD on HD presenting with left knee pain s/p fall. Able to ambulateConcern for injury. Plan/will follow up: xray results.Please see ED provider note and ED Course for further details and disposition.Ollen Barges, MD [DD] 2318 XR Knee Left AP Lateral and ObliquesWell-corticated ossific density adjacent to the tibial spine may reflect an age-indeterminate avulsion fragment. A moderate suprapatellar joint effusion is also suggested. No acute fracture or dislocation is otherwise radiographically appreciated. [DD] Tue Jan 06, 2021 0102 Patient reassessed, pain improved with Lidoderm patch.  Patient able to the ambulate without difficulty.  Patient discharged home with strict return precautions instructions to follow up PCP if symptoms persist for possible outpatient MRI of knee. [DD]  Comments User Index[DD] Ollen Barges, MD   Clinical Impressions as of 01/06/21 1625 Chronic pain of left knee  ED DispositionDischarge Domingo Madeira, PA10/24/22 2318 Ollen Barges, MD10/25/22 0143 Domingo Madeira, PA10/25/22 1625

## 2021-01-06 NOTE — Discharge Instructions
Please manage the pain with Tylenol every 4-6 hours as needed for management. Do not exceed the maximum daily dose recommend on the bottle. Please rest, ice, elevate the knee. Please use the compression wrap as needed to decrease the swelling. Please ice for 15 minutes on and 15 minutes off several times a day. Please follow up with the referral to the orthopedic clinic I have provided for further evaluation and management. Please call them tomorrow. Should you have worsening symptoms, pain increases, you are unable to walk, feel like you cannot bend the leg, please come back to the emergency department for further evaluation.

## 2021-01-06 NOTE — ED Notes
8:41 PM 60 yo male to ED for eval of L knee pain. Reports having intermittent left knee discomfort for the past months, some days have no pain at all. States today he went to stand up and felt intense sharp pain to interior aspect of L knee. No swelling noted. Ambulatory. Took tylenol at home with no relief.

## 2021-01-07 ENCOUNTER — Telehealth: Admit: 2021-01-07 | Payer: PRIVATE HEALTH INSURANCE | Primary: Family Medicine

## 2021-01-07 NOTE — Telephone Encounter
Hi,The patient was seen in the ED on 01/05/21 for Chronic pain of left knee and states he heard a popping noise about a month ago. No ED follow up notes. Please advise, if the patient can be placed in an urgent slot. Thank you.

## 2021-01-12 ENCOUNTER — Encounter: Admit: 2021-01-12 | Payer: PRIVATE HEALTH INSURANCE | Attending: Ophthalmology | Primary: Family Medicine

## 2021-01-19 ENCOUNTER — Inpatient Hospital Stay: Admit: 2021-01-19 | Discharge: 2021-01-19 | Payer: BLUE CROSS/BLUE SHIELD | Primary: Family Medicine

## 2021-01-19 DIAGNOSIS — Z940 Kidney transplant status: Secondary | ICD-10-CM

## 2021-01-19 DIAGNOSIS — E139 Other specified diabetes mellitus without complications: Secondary | ICD-10-CM

## 2021-01-19 DIAGNOSIS — Z79899 Other long term (current) drug therapy: Secondary | ICD-10-CM

## 2021-01-19 DIAGNOSIS — E119 Type 2 diabetes mellitus without complications: Secondary | ICD-10-CM

## 2021-01-19 LAB — CBC WITH AUTO DIFFERENTIAL
BKR WAM ABSOLUTE IMMATURE GRANULOCYTES.: 0.04 x 1000/??L (ref 0.00–0.30)
BKR WAM ABSOLUTE LYMPHOCYTE COUNT.: 0.74 x 1000/??L (ref 0.60–3.70)
BKR WAM ABSOLUTE NRBC (2 DEC): 0 x 1000/??L (ref 0.00–1.00)
BKR WAM ANALYZER ANC: 4.28 x 1000/??L (ref 2.00–7.60)
BKR WAM BASOPHIL ABSOLUTE COUNT.: 0.04 x 1000/??L (ref 0.00–1.00)
BKR WAM BASOPHILS: 0.7 % (ref 0.0–1.4)
BKR WAM EOSINOPHIL ABSOLUTE COUNT.: 0.07 x 1000/??L (ref 0.00–1.00)
BKR WAM EOSINOPHILS: 1.2 % (ref 0.0–5.0)
BKR WAM HEMATOCRIT (2 DEC): 42.6 % (ref 38.50–50.00)
BKR WAM HEMOGLOBIN: 13.2 g/dL (ref 13.2–17.1)
BKR WAM IMMATURE GRANULOCYTES: 0.7 % (ref 0.0–1.0)
BKR WAM LYMPHOCYTES: 12.8 % — ABNORMAL LOW (ref 17.0–50.0)
BKR WAM MCH (PG): 30.7 pg (ref 27.0–33.0)
BKR WAM MCHC: 31 g/dL (ref 31.0–36.0)
BKR WAM MCV: 99.1 fL (ref 80.0–100.0)
BKR WAM MONOCYTE ABSOLUTE COUNT.: 0.59 x 1000/??L (ref 0.00–1.00)
BKR WAM MONOCYTES: 10.2 % (ref 4.0–12.0)
BKR WAM MPV: 9.7 fL (ref 8.0–12.0)
BKR WAM NEUTROPHILS: 74.4 % — ABNORMAL HIGH (ref 39.0–72.0)
BKR WAM NUCLEATED RED BLOOD CELLS: 0 % (ref 0.0–1.0)
BKR WAM PLATELETS: 222 x1000/??L (ref 150–420)
BKR WAM RDW-CV: 13.3 % (ref 11.0–15.0)
BKR WAM RED BLOOD CELL COUNT.: 4.3 M/??L — ABNORMAL HIGH (ref 4.00–6.00)
BKR WAM WHITE BLOOD CELL COUNT: 5.8 x1000/??L (ref 4.0–11.0)

## 2021-01-19 LAB — BASIC METABOLIC PANEL
BKR ANION GAP: 12 (ref 7–17)
BKR BLOOD UREA NITROGEN: 17 mg/dL (ref 6–20)
BKR BUN / CREAT RATIO: 7.6 % — ABNORMAL LOW (ref 8.0–23.0)
BKR CALCIUM: 8.9 mg/dL (ref 8.8–10.2)
BKR CHLORIDE: 98 mmol/L (ref 98–107)
BKR CO2: 32 mmol/L — ABNORMAL HIGH (ref 20–30)
BKR CREATININE: 2.24 mg/dL — ABNORMAL HIGH (ref 0.40–1.30)
BKR EGFR, CREATININE (CKD-EPI 2021): 33 mL/min/{1.73_m2} — ABNORMAL LOW (ref >=60–420)
BKR GLUCOSE: 98 mg/dL (ref 70–100)
BKR POTASSIUM: 3.5 mmol/L (ref 3.3–5.3)
BKR SODIUM: 142 mmol/L (ref 136–144)

## 2021-01-19 LAB — TACROLIMUS LEVEL     (BH GH L LMW YH): BKR TACROLIMUS BLOOD: 5.9 ng/mL

## 2021-01-19 LAB — MAGNESIUM: BKR MAGNESIUM: 1.9 mg/dL (ref 1.7–2.4)

## 2021-01-19 LAB — PHOSPHORUS     (BH GH L LMW YH): BKR PHOSPHORUS: 4.1 mg/dL (ref 2.2–4.5)

## 2021-01-19 MED FILL — MAGNESIUM OXIDE 400 MG (241.3 MG MAGNESIUM) TABLET: 400 mg (241.3 mg magnesium) | ORAL | 30 days supply | Qty: 60 | Fill #1 | Status: CP

## 2021-01-19 MED FILL — TACROLIMUS XR 4 MG TABLET,EXTENDED RELEASE 24 HR: 4 mg | ORAL | 30 days supply | Qty: 60 | Fill #6 | Status: CP

## 2021-01-19 MED FILL — PREDNISONE 5 MG TABLET: 5 mg | ORAL | 30 days supply | Qty: 30 | Fill #7 | Status: CP

## 2021-01-19 MED FILL — ATOVAQUONE 750 MG/5 ML ORAL SUSPENSION: 750 mg/5 mL | ORAL | 30 days supply | Qty: 300 | Fill #7 | Status: CP

## 2021-01-19 MED FILL — OMEPRAZOLE 20 MG CAPSULE,DELAYED RELEASE: 20 mg | ORAL | 90 days supply | Qty: 90 | Fill #2 | Status: CP

## 2021-01-19 MED FILL — CARVEDILOL IMMEDIATE RELEASE 25 MG TABLET: 25 mg | ORAL | 30 days supply | Qty: 60 | Fill #2 | Status: CP

## 2021-01-20 LAB — HEMOGLOBIN A1C
BKR ESTIMATED AVERAGE GLUCOSE: 137 mg/dL
BKR HEMOGLOBIN A1C: 6.4 % — ABNORMAL HIGH (ref 4.0–5.6)

## 2021-01-20 LAB — CYTOMEGALOVIRUS QUANTITATIVE PCR, PLASMA     (BH GH L LMW YH): BKR CMV QUANTITATIVE PCR: NOT DETECTED

## 2021-01-20 LAB — BK VIRUS BY PCR, QUANTITATIVE, PLASMA: BKR BK VIRUS, QUANTITATIVE PCR, PLASMA: NOT DETECTED

## 2021-01-26 MED FILL — ATORVASTATIN 40 MG TABLET: 40 mg | ORAL | 90 days supply | Qty: 90 | Fill #3 | Status: CP

## 2021-01-26 MED FILL — MYCOPHENOLATE MOFETIL 250 MG CAPSULE: 250 mg | ORAL | 90 days supply | Qty: 540 | Fill #4 | Status: CP

## 2021-02-13 MED FILL — PREDNISONE 5 MG TABLET: 5 mg | ORAL | 30 days supply | Qty: 30 | Fill #8 | Status: CP

## 2021-02-13 MED FILL — TACROLIMUS XR 1 MG TABLET,EXTENDED RELEASE 24 HR: 1 mg | ORAL | 90 days supply | Qty: 90 | Fill #1 | Status: CP

## 2021-02-13 MED FILL — MAGNESIUM OXIDE 400 MG (241.3 MG MAGNESIUM) TABLET: 400 mg (241.3 mg magnesium) | ORAL | 30 days supply | Qty: 60 | Fill #2 | Status: CP

## 2021-02-13 MED FILL — CARVEDILOL IMMEDIATE RELEASE 25 MG TABLET: 25 mg | ORAL | 30 days supply | Qty: 60 | Fill #3 | Status: CP

## 2021-02-13 MED FILL — TACROLIMUS XR 4 MG TABLET,EXTENDED RELEASE 24 HR: 4 mg | ORAL | 30 days supply | Qty: 60 | Fill #9 | Status: CP

## 2021-02-13 MED FILL — TACROLIMUS XR 4 MG TABLET,EXTENDED RELEASE 24 HR: 4 mg | ORAL | 30 days supply | Qty: 60 | Fill #7 | Status: CP

## 2021-02-13 MED FILL — AMLODIPINE 10 MG TABLET: 10 mg | ORAL | 90 days supply | Qty: 90 | Fill #1 | Status: CP

## 2021-02-13 MED FILL — ATOVAQUONE 750 MG/5 ML ORAL SUSPENSION: 750 mg/5 mL | ORAL | 30 days supply | Qty: 300 | Fill #8 | Status: CP

## 2021-02-19 ENCOUNTER — Encounter: Admit: 2021-02-19 | Payer: PRIVATE HEALTH INSURANCE | Attending: "Endocrinology | Primary: Family Medicine

## 2021-03-02 ENCOUNTER — Inpatient Hospital Stay: Admit: 2021-03-02 | Discharge: 2021-03-02 | Payer: BLUE CROSS/BLUE SHIELD | Primary: Family Medicine

## 2021-03-02 ENCOUNTER — Encounter: Admit: 2021-03-02 | Payer: PRIVATE HEALTH INSURANCE | Primary: Family Medicine

## 2021-03-02 DIAGNOSIS — Z298 Encounter for other specified prophylactic measures: Secondary | ICD-10-CM

## 2021-03-02 DIAGNOSIS — Z940 Kidney transplant status: Secondary | ICD-10-CM

## 2021-03-02 DIAGNOSIS — Z2989 Need for prophylactic immunotherapy: Secondary | ICD-10-CM

## 2021-03-02 LAB — ALBUMIN/CREATININE PANEL, URINE, RANDOM
BKR ALBUMIN, URINE, RANDOM: 287.2 mg/L
BKR ALBUMIN/CREATININE RATIO, URINE, RANDOM: 260.1 mg/g{creat} — ABNORMAL HIGH (ref <30.0)
BKR CREATININE, URINE, RANDOM: 110 mg/dL

## 2021-03-02 LAB — CBC WITH AUTO DIFFERENTIAL
BKR CREATININE: 0 x 1000/??L — ABNORMAL HIGH (ref 0.00–1.00)
BKR WAM ABSOLUTE IMMATURE GRANULOCYTES.: 0.03 x 1000/??L (ref 0.00–0.30)
BKR WAM ABSOLUTE LYMPHOCYTE COUNT.: 0.89 x 1000/??L (ref 0.60–3.70)
BKR WAM ABSOLUTE NRBC (2 DEC): 0 x 1000/ÂµL (ref 0.00–1.00)
BKR WAM ANALYZER ANC: 5.22 x 1000/??L (ref 2.00–7.60)
BKR WAM BASOPHIL ABSOLUTE COUNT.: 0.03 x 1000/ÂµL (ref 0.00–1.00)
BKR WAM BASOPHILS: 0.4 % (ref 0.0–1.4)
BKR WAM EOSINOPHIL ABSOLUTE COUNT.: 0.08 x 1000/ÂµL (ref 0.00–1.00)
BKR WAM EOSINOPHILS: 1.2 % (ref 0.0–5.0)
BKR WAM HEMATOCRIT (2 DEC): 41.8 % (ref 38.50–50.00)
BKR WAM HEMOGLOBIN: 13.5 g/dL (ref 13.2–17.1)
BKR WAM IMMATURE GRANULOCYTES: 0.4 % (ref 0.0–1.0)
BKR WAM LYMPHOCYTES: 13.1 % — ABNORMAL LOW (ref 17.0–50.0)
BKR WAM MCH (PG): 30.5 pg (ref 27.0–33.0)
BKR WAM MCHC: 32.3 g/dL (ref 31.0–36.0)
BKR WAM MCV: 94.4 fL — ABNORMAL HIGH (ref 80.0–100.0)
BKR WAM MONOCYTE ABSOLUTE COUNT.: 0.53 x 1000/??L (ref 0.00–1.00)
BKR WAM MONOCYTES: 7.8 % (ref 4.0–12.0)
BKR WAM MPV: 9.9 fL (ref 8.0–12.0)
BKR WAM NEUTROPHILS: 77.1 % — ABNORMAL HIGH (ref 39.0–72.0)
BKR WAM NUCLEATED RED BLOOD CELLS: 0 % (ref 0.0–1.0)
BKR WAM PLATELETS: 237 x1000/ÂµL (ref 150–420)
BKR WAM RDW-CV: 13.2 % (ref 11.0–15.0)
BKR WAM RED BLOOD CELL COUNT.: 4.43 M/??L (ref 4.00–6.00)
BKR WAM WHITE BLOOD CELL COUNT: 6.8 x1000/??L — ABNORMAL LOW (ref 4.0–11.0)

## 2021-03-02 LAB — PROTEIN, TOTAL W/CREATININE, URINE, RANDOM     (BH GH LMW YH)
BKR CREATININE, URINE, RANDOM: 110 mg/dL
BKR PROTEIN URINE RANDOM: 0.47 g/L
BKR PROTEIN/CREATININE RATIO, URINE, RANDOM: 0.42 mg/mg Cr — ABNORMAL HIGH (ref <30.0)

## 2021-03-02 LAB — MAGNESIUM: BKR MAGNESIUM: 1.7 mg/dL (ref 1.7–2.4)

## 2021-03-02 LAB — BASIC METABOLIC PANEL
BKR ANION GAP: 15 (ref 7–17)
BKR BLOOD UREA NITROGEN: 21 mg/dL — ABNORMAL HIGH (ref 6–20)
BKR BUN / CREAT RATIO: 10 % (ref 8.0–23.0)
BKR CALCIUM: 8.8 mg/dL (ref 8.8–10.2)
BKR CHLORIDE: 95 mmol/L — ABNORMAL LOW (ref 98–107)
BKR CO2: 30 mmol/L (ref 20–30)
BKR CREATININE: 2.09 mg/dL — ABNORMAL HIGH (ref 0.40–1.30)
BKR EGFR, CREATININE (CKD-EPI 2021): 36 mL/min/1.73m2 — ABNORMAL LOW (ref >=60–420)
BKR GLUCOSE: 93 mg/dL (ref 70–100)
BKR POTASSIUM: 3.4 mmol/L (ref 3.3–5.3)
BKR SODIUM: 140 mmol/L (ref 136–144)

## 2021-03-02 LAB — PHOSPHORUS     (BH GH L LMW YH): BKR PHOSPHORUS: 4.1 mg/dL (ref 2.2–4.5)

## 2021-03-02 LAB — TACROLIMUS LEVEL     (BH GH L LMW YH): BKR TACROLIMUS BLOOD: 5 ng/mL

## 2021-03-02 NOTE — Progress Notes
MD unavailable, verbal obtained from Dr. Lucianne Muss, orders placed.

## 2021-03-03 ENCOUNTER — Ambulatory Visit: Admit: 2021-03-03 | Payer: BLUE CROSS/BLUE SHIELD | Attending: Internal Medicine | Primary: Family Medicine

## 2021-03-03 DIAGNOSIS — D849 Immunodeficiency, unspecified: Secondary | ICD-10-CM

## 2021-03-03 DIAGNOSIS — Z5181 Encounter for therapeutic drug level monitoring: Secondary | ICD-10-CM

## 2021-03-03 DIAGNOSIS — I10 Essential (primary) hypertension: Secondary | ICD-10-CM

## 2021-03-03 LAB — BK VIRUS BY PCR, QUANTITATIVE, PLASMA: BKR BK VIRUS, QUANTITATIVE PCR, PLASMA: NOT DETECTED

## 2021-03-03 NOTE — Progress Notes
[facility] Kidney/Pancreas Transplant Program Medical follow up Visit Chief complaint: Adam Harper returns for follow up YN:WGNFAO transplant function assessmentImmuno drug therapy requiring intensive monitoring for efficacy and toxicityBlood pressure managementImportant Transplant History: ?	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	CMV D + / R + ?	Donor Toxo positive ?	CPRA zero, Alemtuzumab ?	9/4-9/7: admit for hyperK+, urgent HD. Bactrim switched to Atovaquone?	Intermittent pre-syncopal events, holter monitoring.?	Stent removal 11/28/2019?	10/22: Biopsy: ACUTE TUBULAR INJURY, FOCALLast updated : 12/13/19 , Adam Scotland, MD History of Present Illness:Adam Harper comes to the transplant clinic today for a routine clinic visit. Overall patient is doing very well and denies any specific complains. His main complaint is ongoing aches and pains.  Denies any urinary complaints.  He had an ED visit for knee pain.PFSH reviewed and confirmed 03/03/2021 Review of SystemsIn addition, following review of systems was conducted : Patient denies any fevers or chills, no nausea or vomiting. No chest pain/ chest tightness, no cough/sputum or shortness of breath. No headaches, neck stiffness, no vision changes, no ear or nose discharge. No oral lesions. No constipation or diarrhea. No urinary complains like dysuria, hematuria, cloudy urine, reduced output. No skin rashes, no joint effusions. No significant weight gain or weight loss.Physical Exam  Vitals: There were no vitals taken for this visit.Wt Readings from Last 4 Encounters: 12/19/20 123.4 kg 12/08/20 125 kg 12/02/20 124.3 kg 10/28/20 126.1 kg GENERAL : Appears wellHEENT : EOMI , mucous membranes moist RESPIRATORY : NVBS heard bilaterallyCARDIOVASCULAR : S1 S2 + , no added sounds heard. ABDOMEN : Soft, non-tenderEXTREMITIES : No edemaINTEGUMENT : No rashesLabs:Lab Results Component Value Date  CREATININE 2.09 (H) 03/02/2021  BUN 21 (H) 03/02/2021  NA 140 03/02/2021  K 3.4 03/02/2021  CL 95 (L) 03/02/2021  CO2 30 03/02/2021  Lab Results Component Value Date  WBC 6.8 03/02/2021  HGB 13.5 03/02/2021  HCT 41.80 03/02/2021  MCV 94.4 03/02/2021  PLT 237 03/02/2021 Lab Results Component Value Date  TACROLIMUS 5.0 03/02/2021 Most recent BK viral testing -- BK Virus Quantitative PCR (no units) Date Value 01/19/2021 Not Detected Most recent CMV viral testing -- Cytomegalovirus Quantitative PCR (no units) Date Value 01/19/2021 Not Detected Immunosuppression     Start End  mycophenolate mofetil (CELLCEPT) 250 mg capsule 08/04/2020   Sig - Route: Take 3 capsules (750 mg total) by mouth 2 (two) times daily. - Oral  Notes to Pharmacy: Kidney Transplant Z94.0.  predniSONE (DELTASONE) 5 mg tablet 05/21/2020   Sig - Route: Take 1 tablet (5 mg total) by mouth daily. Take after completion of taper. - Oral  Notes to Pharmacy: Kidney Transplant Z94.0  tacrolimus XR (ENVARSUS XR) 1 mg 24 hr tablet 11/07/2020   Sig - Route: Take 1 tablet (1 mg total) by mouth daily. Take 1 mg to make dose to make total 9 mg daily. - Oral  Notes to Pharmacy: z94.0  tacrolimus XR (ENVARSUS XR) 4 mg 24 hr tablet 06/21/2020   Sig - Route: Take 2 tablets (8 mg total) by mouth daily. - Oral  Antihypertensives     Start End  amLODIPine (NORVASC) 10 mg tablet 10/20/2020 10/20/2021  Sig - Route: Take 1 tablet (10 mg total) by mouth every morning. - Oral  carvediloL (COREG) 25 mg Immediate Release tablet 10/20/2020 10/20/2021  Sig - Route: Take 1 tablet (25 mg total) by mouth 2 (two) times daily with breakfast and dinner. - Oral  chlorthalidone (HYGROTEN) 50 mg tablet 10/20/2020 10/20/2021  Sig - Route: Take 1  tablet (50 mg total) by mouth daily. - Oral  furosemide (LASIX) 40 mg tablet 09/26/2020   Sig - Route: Take 1 tablet (40 mg total) by mouth daily. - Oral  Transplant Prophylaxis       atovaquone (MEPRON) 750 mg/5 mL suspension Take 10 mLs (1,500 mg total) by mouth daily with dinner.  06/19/20 1657 - Original Entry by Adam Harper, MBBS  Authorizing Provider: Lyman Harper, MBBS         Name:  Adam Harper) 750 mg/5 mL suspension Start date:  06/19/20 End date:  06/14/21 2359  Medication:  ATOVAQUONE 750 MG/5 ML ORAL SUSPENSION [[id number]]  Dose:  1,500 mg Route:  Oral Frequency:  With Dinner  Sig: Take 10 mLs (1,500 mg total) by mouth daily with dinner.  Instructions:  --     Changes to the order are displayed in red.  Note that there may be changes made to the order that are not shown in this report, such as changes to details about ingredients.   lidocaine maalox diphenhydramine nystatin (MAGIC MOUTHWASH + NYSTATIN) 1:1:1:1 Swish and swallow 10 mLs 4 (four) times daily.  06/05/20 1440 - Original Entry by Adam Harper, MBBS  Authorizing Provider: Lyman Harper, MBBS         Name:  lidocaine maalox diphenhydramine nystatin (MAGIC MOUTHWASH + NYSTATIN) 1:1:1:1 Start date:  06/05/20 End date:  --  Medication:  LIDOCAINE MAALOX DIPHEN NYSTATIN 1:1:1:1 MAGIC MOUTHWASH (COMPOUND) [[id number]]  Dose:  10 mL Route:  Swish & Swallow Frequency:  4 Times Daily Scheduled  Sig: Swish and swallow 10 mLs 4 (four) times daily.  Instructions:  --     Changes to the order are displayed in red.  Note that there may be changes made to the order that are not shown in this report, such as changes to details about ingredients.     Viral monitoring : Adam Harper is Lab Results Component Value Date  CYTOMEGALOVI Positive (A) 10/02/2019 Assessment / PlanAllograft function: DDKT 11/05/19, Creatinine at 2.0	- this is the lowest creatinine he has had in a while.  Biopsy in mid October showed that he had acute tubular injury with no evidence of rejection.  I think that he very sensitive to tacrolimus as low creatinine correlates with lower tacrolimus level (5.0 today)	- will talk about possible Kandis Nab conversion.	- trace proteinuria.  Upc 0.4.	- stable electrolytes. Mild metabolic alkalosis with a bicarb of 30 (likely diuretic-induced)Immunosuppression: envarsus 9mg /day, MMF 750mg  bid, prednisone 5mg /day	- tacrolimus drug level is 5.0.  He is taking Envarsus with foods.  Advised him to take Envarsus on an empty stomach.  We will not change the dosing at this time.	- will also talk about possible conversion to belatacept.Hypertension: goal blood pressure <130/80	- currently on amlodipine 10mg /day, coreg 25mg  bid and chlorthalidone 50mg /day	- clinic blood pressures are in 130s systolic range.  No changes made.Aches and pains:  He is complaining of bilateral knee pain as well as back pain.	- will refer him to Rheumatology to see if he has arthritis or osteoporosis/osteopenia which is leading to his pain.  He might need a DEXA bone scan as well.	- I also advised him to lose some weight and exercise (both cardio as well as strength training) every day.Infection Prophylaxis: 	- on atovaquononeRTC in 3 months with labs.Kathaleen Maser, MD, FASN, FASTAssistant Professor of MedicineTransplant nephrologist Section of NephrologyDepartment of Internal Medicine [facility] of [id number]: [phone]

## 2021-03-04 DIAGNOSIS — Z940 Kidney transplant status: Secondary | ICD-10-CM

## 2021-03-10 MED FILL — CARVEDILOL IMMEDIATE RELEASE 25 MG TABLET: 25 mg | ORAL | 30 days supply | Qty: 60 | Fill #4 | Status: CP

## 2021-03-10 MED FILL — ATOVAQUONE 750 MG/5 ML ORAL SUSPENSION: 750 mg/5 mL | ORAL | 30 days supply | Qty: 300 | Fill #9 | Status: CP

## 2021-03-10 MED FILL — TACROLIMUS XR 4 MG TABLET,EXTENDED RELEASE 24 HR: 4 mg | ORAL | 30 days supply | Qty: 60 | Fill #8 | Status: CP

## 2021-03-10 MED FILL — PREDNISONE 5 MG TABLET: 5 mg | ORAL | 30 days supply | Qty: 30 | Fill #9 | Status: CP

## 2021-03-11 ENCOUNTER — Ambulatory Visit: Admit: 2021-03-11 | Payer: BLUE CROSS/BLUE SHIELD | Attending: Cardiovascular Disease | Primary: Family Medicine

## 2021-03-17 ENCOUNTER — Encounter: Admit: 2021-03-17 | Payer: PRIVATE HEALTH INSURANCE | Attending: Medical | Primary: Family Medicine

## 2021-03-17 ENCOUNTER — Encounter: Admit: 2021-03-17 | Payer: PRIVATE HEALTH INSURANCE | Attending: Orthopedic Surgery | Primary: Family Medicine

## 2021-04-07 ENCOUNTER — Ambulatory Visit: Admit: 2021-04-07 | Payer: PRIVATE HEALTH INSURANCE | Attending: Orthopedic Surgery | Primary: Family Medicine

## 2021-04-07 ENCOUNTER — Inpatient Hospital Stay: Admit: 2021-04-07 | Discharge: 2021-04-07 | Payer: PRIVATE HEALTH INSURANCE | Primary: Internal Medicine

## 2021-04-07 ENCOUNTER — Encounter: Admit: 2021-04-07 | Payer: PRIVATE HEALTH INSURANCE | Attending: Orthopedic Surgery | Primary: Family Medicine

## 2021-04-07 DIAGNOSIS — D649 Anemia, unspecified: Secondary | ICD-10-CM

## 2021-04-07 DIAGNOSIS — T827XXA Infection and inflammatory reaction due to other cardiac and vascular devices, implants and grafts, initial encounter: Secondary | ICD-10-CM

## 2021-04-07 DIAGNOSIS — S83512A Sprain of anterior cruciate ligament of left knee, initial encounter: Secondary | ICD-10-CM

## 2021-04-07 DIAGNOSIS — K7460 Unspecified cirrhosis of liver: Secondary | ICD-10-CM

## 2021-04-07 DIAGNOSIS — N186 End stage renal disease: Secondary | ICD-10-CM

## 2021-04-07 DIAGNOSIS — E213 Hyperparathyroidism, unspecified: Secondary | ICD-10-CM

## 2021-04-07 DIAGNOSIS — I712 Thoracic aortic aneurysm (HC Code): Secondary | ICD-10-CM

## 2021-04-07 DIAGNOSIS — K219 Gastro-esophageal reflux disease without esophagitis: Secondary | ICD-10-CM

## 2021-04-07 DIAGNOSIS — N2581 Secondary hyperparathyroidism of renal origin: Secondary | ICD-10-CM

## 2021-04-07 DIAGNOSIS — I770 Arteriovenous fistula, acquired: Secondary | ICD-10-CM

## 2021-04-07 DIAGNOSIS — G4733 Obstructive sleep apnea (adult) (pediatric): Secondary | ICD-10-CM

## 2021-04-07 DIAGNOSIS — I10 Essential (primary) hypertension: Secondary | ICD-10-CM

## 2021-04-07 DIAGNOSIS — J101 Influenza due to other identified influenza virus with other respiratory manifestations: Secondary | ICD-10-CM

## 2021-04-07 DIAGNOSIS — E7800 Pure hypercholesterolemia, unspecified: Secondary | ICD-10-CM

## 2021-04-08 ENCOUNTER — Encounter: Admit: 2021-04-08 | Payer: PRIVATE HEALTH INSURANCE | Attending: "Endocrinology | Primary: Family Medicine

## 2021-04-08 MED ORDER — TRULICITY 1.5 MG/0.5 ML SUBCUTANEOUS PEN INJECTOR
1.50.5 mg/0.5 mL | SUBCUTANEOUS | 12 refills | 28.00 days | Status: AC
Start: 2021-04-08 — End: 2021-04-10

## 2021-04-08 MED FILL — MAGNESIUM OXIDE 400 MG (241.3 MG MAGNESIUM) TABLET: 400 mg (241.3 mg magnesium) | ORAL | 30 days supply | Qty: 60 | Fill #3 | Status: CP

## 2021-04-08 MED FILL — CHLORTHALIDONE 50 MG TABLET: 50 mg | ORAL | 90 days supply | Qty: 90 | Fill #1 | Status: CP

## 2021-04-08 MED FILL — MAGNESIUM OXIDE 400 MG (241.3 MG MAGNESIUM) TABLET: 400 mg (241.3 mg magnesium) | ORAL | 30 days supply | Qty: 60 | Fill #4 | Status: CP

## 2021-04-08 NOTE — Progress Notes
Chief Complaint: Left knee painHPI:Is a 61 y.o. male with a chief complaint of Left knee pain. The pain began approximately 3-5 months ago. his pain is in the medial aspect of his Left knee. Pain does not radiate to the lower leg. The pain is rated as a 7-10 out of 10 with activity and 0 out of 10 at rest.Patient denies complaints of numbness or tingling. The pain is exacerbated by climbing stairs and ambulation. Navigates stairs + use of the banister. + difficulty with shoes and socks. Pain is not relieved by NSAID's. Patient has been taking Tylenol and Voltaren gel on a p.r.n. basis as well as using ice. Kidney transplant on 08/22/2021Duration: worsening over last 3 months since fall Injury: + fall in 01/05/2021 and knee twisted.Walking tolerance: minimalLimp: +Support: + caneSwelling: -Crepitation: -Instability: -Stairs: +Injections: -NSAID's: + Tylenol Prior surgery: -Back pain: -Hip pain: -Risk of AVN : -At this point, the patient has  failed conservative management and is here to discuss operative versus nonoperative treatment.ZOX:WRUE Medical History: Diagnosis Date ? A-V fistula (HC Code) (HC CODE) (HC Code)   right arm ? Anemia  ? 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 Overview:  Added automatically from request for surgery [id number] Overview:  Added automatically from request for surgery [id number] ? Anemia in ESRD (end-stage renal disease) (HC Code) 10/24/2014  Added automatically from request for surgery [id number]  Added automatically from request for surgery [id number] Overview:  Added automatically from request for surgery [id number] ? AV fistula infection (HC Code) (HC CODE) (HC Code)  ? Cirrhosis (HC Code) (HC CODE) (HC Code)  ? ESRD on hemodialysis (HC Code) (HC CODE) (HC Code)   Mon-Wed-Fri ? GERD (gastroesophageal reflux disease)  ? Hypercholesteremia  ? Hyperparathyroidism (HC Code) (HC CODE) (HC Code)  ? Hypertension  ? Infected prosthetic vascular graft, initial encounter (HC Code) (HC CODE) (HC Code) 10/24/2014 ? Influenza A  ? Morbid obesity (HC Code) (HC CODE) (HC Code) 02/03/2018 ? Obstructive sleep apnea   pt uses CPAP ? Secondary hyperparathyroidism of renal origin (HC Code) (HC CODE) (HC Code)  ? Thoracic aortic aneurysm [facility] Code)  UXL:KGMW Surgical History: Procedure Laterality Date ? AV FISTULA PLACEMENT Left   left lower arm unsuccessful, then moved to upper arm 2011, then had problems in 10/2013 - infection Allergies:No Known AllergiesMedications:Current Outpatient Medications on File Prior to Visit Medication Sig Dispense Refill ? amLODIPine (NORVASC) 10 mg tablet Take 1 tablet (10 mg total) by mouth every morning. 90 tablet 3 ? atorvastatin (LIPITOR) 40 mg tablet Take 1 tablet (40 mg total) by mouth daily. 90 tablet 3 ? atovaquone (MEPRON) 750 mg/5 mL suspension Take 10 mLs (1,500 mg total) by mouth daily with dinner. 300 mL 11 ? 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 Please dispense supplies covered by patient's insurance. 100 strip 11 ? carvediloL (COREG) 25 mg Immediate Release tablet Take 1 tablet (25 mg total) by mouth 2 (two) times daily with breakfast and dinner. 60 tablet 11 ? chlorthalidone (HYGROTEN) 50 mg tablet Take 1 tablet (50 mg total) by mouth daily. 90 tablet 3 ? diclofenac (VOLTAREN) 1 % gel Apply topically 4 (four) times daily. 100 g 2 ? dulaglutide (TRULICITY) 1.5 mg/0.5 mL pen injector Inject 0.5 mLs (1.5 mg total) under the skin once a week. 2 mL 11 ? ergocalciferol (VITAMIN D2) 1,250 mcg (50,000 unit)  capsule Take 1 capsule (50,000 Units total) by mouth once a week. (Patient not taking: Reported on 12/02/2020) 12 capsule 0 ? 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 Please dispense supplies covered by patient's insurance. 100 each 11 ? lidocaine maalox diphenhydramine nystatin (MAGIC MOUTHWASH + NYSTATIN) 1:1:1:1 Swish and swallow 10 mLs 4 (four) times daily. (Patient not taking: Reported on 12/02/2020) 500 mL 0 ? magnesium oxide (MAG-OX) 400 mg (241.3 mg magnesium) tablet Take 1 tablet (400 mg total) by mouth 2 (two) times daily. 60 tablet 3 ? mycophenolate mofetil (CELLCEPT) 250 mg capsule Take 3 capsules (750 mg total) by mouth 2 (two) times daily. 240 capsule 11 ? omeprazole (PRILOSEC) 20 mg capsule Take 1 capsule (20 mg total) by mouth daily. 90 capsule 3 ? predniSONE (DELTASONE) 5 mg tablet Take 1 tablet (5 mg total) by mouth daily. Take after completion of taper. 30 tablet 11 ? tacrolimus XR (ENVARSUS XR) 1 mg 24 hr tablet Take 1 tablet (1 mg total) by mouth daily. Take 1 mg to make dose to make total 9 mg daily. 90 tablet 11 ? tacrolimus XR (ENVARSUS XR) 4 mg 24 hr tablet Take 2 tablets (8 mg total) by mouth daily. 90 tablet 11 ? urea (CARMOL) 20 % cream Apply topically as needed. 85 g 3 No current facility-administered medications on file prior to visit.  SocHx:Social History Occupational History ? Not on file Tobacco Use ? Smoking status: Former   Packs/day: 0.25   Years: 25.00   Pack years: 6.25   Types: Cigarettes ? Smokeless tobacco: Never Vaping Use ? Vaping Use: Never used Substance and Sexual Activity ? Alcohol use: No ? Drug use: No ? Sexual activity: Yes   Partners: Female FamHx:he family history is not on file.WRU:EAVWUJ see the attached intake ROS.X-ray Interpretation: The patient received a standard set of films of the affected knee including a standing AP/Lat/PA flexion, and a sunrise view. Minimal djd left knee. Varus deformity.small avulsion fracture tibia spine likely acl insertion. Suspect meniscus tear medially. Formal Radiology Reading follows below:No results found.Recent Lab Testing:Lab Results Component Value Date  WBC 6.8 03/02/2021  HGB 13.5 03/02/2021  HCT 41.80 03/02/2021  MCV 94.4 03/02/2021  PLT 237 03/02/2021 Lab Results Component Value Date  CREATININE 2.09 (H) 03/02/2021  BUN 21 (H) 03/02/2021  NA 140 03/02/2021  K 3.4 03/02/2021  CL 95 (L) 03/02/2021  CO2 30 03/02/2021 Lab Results Component Value Date  INR 1.06 11/05/2019  INR 1.1 02/15/2019  INR 1.09 10/03/2018 No results found for: SEDRATE, CRPHemoglobin A1C:Lab Results Component Value Date  HGBA1C 6.4 (H) 01/19/2021  HGBA1C 6.6 (H) 12/19/2020  HGBA1C 7.3 (H) 08/05/2020 Micro Lab Testing:No results found for: MICRODiagnosis Coding:No diagnosis found.Physical exam:This is a well developed male who is alert, oriented times three and in no apparent distress. Skin is intact over the Left knee as well as the lower extremity with no abrasions, lacerations, or ulcerations. Observation of the patient's gait reveals a + antalgic gait with no thrust. There is pain on palpation of medial/lateral joint line. No patellofemoral crepitus. The patient demonstrates grinding anteriorly with ROM. Range of motion: 10 extension to approximately 100 degrees of flexion. + Valgus stress with pain, no pain with + varus stress. + anterior drawer, PCL appears intact. Neurovascularly intact with 5/5 EHL/tibialis anterior/gastroc. Sensation intact to light touch. Palpable, symmetric dorsalis pedis and posterior tibial pulses in both lower extremities. Hip examination normal. R: ROM 0-100, stable with stress,  no pain on palpation. Impression and Plan:The patient's symptoms are consistent with suspected ACL tear.Explained that I believe that he has a possible ACL rupture. I have ordered an MRI Scan and will refer him to see orthopedic sports medicine as well as to start with physical therapy to work on strengthening. Order for ACL brace.  He will have xray imaging of the left knee today as well. Knee OrthoticPatient was prescribed a knee orthosis for the above indicated diagnosis.  The patient is ambulatory but has weakness due to a recent injury or surgical procedure on their knee which requires stabilization from this semi-rigid / rigid orthosis to improve their function. Verbal and written instructions for the use and application of this item were given.  Patient was instructed that should the brace result in increased pain, decreased sensation, increased swelling, or an overall worsening of their medical condition, to please contact our office immediately.        Over 45 minutes spent with patient, more than 50% on face to face counseling and providing coordination of care.Scribed for Jimmie Molly, MD by Marene Lenz, medical scribe April 05, 2021  The documentation recorded by the scribe accurately reflects the services I personally performed and the decisions made by me. I reviewed and confirmed all material entered and/or pre-charted by the scribe. Jimmie Molly, MD

## 2021-04-10 ENCOUNTER — Inpatient Hospital Stay: Admit: 2021-04-10 | Discharge: 2021-04-10 | Payer: PRIVATE HEALTH INSURANCE | Primary: Internal Medicine

## 2021-04-10 ENCOUNTER — Ambulatory Visit: Admit: 2021-04-10 | Payer: PRIVATE HEALTH INSURANCE | Attending: "Endocrinology | Primary: Family Medicine

## 2021-04-10 DIAGNOSIS — E119 Type 2 diabetes mellitus without complications: Secondary | ICD-10-CM

## 2021-04-10 DIAGNOSIS — Z2989 Need for prophylactic immunotherapy: Secondary | ICD-10-CM

## 2021-04-10 DIAGNOSIS — Z940 Kidney transplant status: Secondary | ICD-10-CM

## 2021-04-10 DIAGNOSIS — E785 Hyperlipidemia, unspecified: Secondary | ICD-10-CM

## 2021-04-10 DIAGNOSIS — Z298 Encounter for other specified prophylactic measures: Secondary | ICD-10-CM

## 2021-04-10 LAB — CBC WITH AUTO DIFFERENTIAL
BKR WAM ABSOLUTE LYMPHOCYTE COUNT.: 0.86 x 1000/??L (ref 0.60–3.70)
BKR WAM ABSOLUTE NRBC (2 DEC): 0 x 1000/??L (ref 0.00–1.00)
BKR WAM ANALYZER ANC: 5.98 x 1000/??L (ref 2.00–7.60)
BKR WAM BASOPHIL ABSOLUTE COUNT.: 0.04 x 1000/??L (ref 0.00–1.00)
BKR WAM BASOPHILS: 0.5 % (ref 0.0–1.4)
BKR WAM EOSINOPHIL ABSOLUTE COUNT.: 0.06 x 1000/??L (ref 0.00–1.00)
BKR WAM EOSINOPHILS: 0.8 % (ref 0.0–5.0)
BKR WAM HEMATOCRIT (2 DEC): 43.4 % (ref 38.50–50.00)
BKR WAM HEMOGLOBIN: 14.6 g/dL (ref 13.2–17.1)
BKR WAM IMMATURE GRANULOCYTES: 0.4 % (ref 0.0–1.0)
BKR WAM LYMPHOCYTES: 11.4 % — ABNORMAL LOW (ref 17.0–50.0)
BKR WAM MCH (PG): 30.6 pg (ref 27.0–33.0)
BKR WAM MCHC: 33.6 g/dL (ref 31.0–36.0)
BKR WAM MCV: 91 fL (ref 80.0–100.0)
BKR WAM MONOCYTE ABSOLUTE COUNT.: 0.55 x 1000/??L (ref 0.00–1.00)
BKR WAM MONOCYTES: 7.3 % — ABNORMAL LOW (ref 4.0–12.0)
BKR WAM MPV: 10 fL (ref 8.0–12.0)
BKR WAM NEUTROPHILS: 79.6 % — ABNORMAL HIGH (ref 39.0–72.0)
BKR WAM NUCLEATED RED BLOOD CELLS: 0 % (ref 0.0–1.0)
BKR WAM PLATELETS: 237 x1000/??L (ref 150–420)
BKR WAM RDW-CV: 13.8 % (ref 11.0–15.0)
BKR WAM RED BLOOD CELL COUNT.: 4.77 M/??L (ref 4.00–6.00)
BKR WAM WHITE BLOOD CELL COUNT: 7.5 x1000/??L (ref 4.0–11.0)

## 2021-04-10 LAB — BASIC METABOLIC PANEL
BKR ANION GAP: 17 x 1000/??L (ref 7–17)
BKR BLOOD UREA NITROGEN: 20 mg/dL (ref 6–20)
BKR BUN / CREAT RATIO: 9.4 (ref 8.0–23.0)
BKR CALCIUM: 9.3 mg/dL (ref 8.8–10.2)
BKR CHLORIDE: 98 mmol/L (ref 98–107)
BKR CO2: 27 mmol/L (ref 20–30)
BKR EGFR, CREATININE (CKD-EPI 2021): 35 mL/min/{1.73_m2} — ABNORMAL LOW (ref >=60)
BKR GLUCOSE: 98 mg/dL (ref 70–100)
BKR POTASSIUM: 3.4 mmol/L (ref 3.3–5.3)
BKR SODIUM: 142 mmol/L (ref 136–144)

## 2021-04-10 LAB — PHOSPHORUS     (BH GH L LMW YH): BKR PHOSPHORUS: 3.7 mg/dL (ref 2.2–4.5)

## 2021-04-10 LAB — MAGNESIUM: BKR MAGNESIUM: 1.7 mg/dL — ABNORMAL HIGH (ref 1.7–2.4)

## 2021-04-10 MED ORDER — DULAGLUTIDE 3 MG/0.5 ML SUBCUTANEOUS PEN INJECTOR
30.5 mg/0.5 mL | SUBCUTANEOUS | 4 refills | 84.00 days | Status: AC
Start: 2021-04-10 — End: 2021-08-07
  Filled 2021-04-14: qty 6, 84d supply, fill #0

## 2021-04-11 NOTE — Progress Notes
Adam Harper returns to the Transplant-Endocrinology Clinic for a f/u visit for DM management. He is 61 y.o. 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  Pt denies Hx of DM, but A1c in the diabetic range since 05/2020 (pre-diabetes at time of TXPL). He was diagnosed with DM 6 months ago (05/2020).  He has no complications from diabetes.Visit translated by Spanish interpreter (ID # [id number])Interim Hx:Pt states tolerating Trulicity well.Has been having L knee pain.DM Regimen: Trulicity 0.75 mg qdHypoglycemia: n/aSMBG: forgot glucose meter. Per pt's report, BG in the 120's.Exercise: walking.Review of Systems Constitutional: Negative for fever. Some weight loss..Eyes: Negative for visual disturbance. Respiratory: Negative for cough or shortness of breath.Cardiovascular: Negative for chest pain. Denies abdominal pain, N/V.Neurological: Negative for numbness. Lower ext: Scab on R shin.Current Outpatient Medications Medication Sig ? amLODIPine (NORVASC) 10 mg tablet Take 1 tablet (10 mg total) by mouth every morning. ? atorvastatin (LIPITOR) 40 mg tablet Take 1 tablet (40 mg total) by mouth daily. ? atovaquone (MEPRON) 750 mg/5 mL suspension Take 10 mLs (1,500 mg total) by mouth daily with dinner. ? 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 Please dispense supplies covered by patient's insurance. ? carvediloL (COREG) 25 mg Immediate Release tablet Take 1 tablet (25 mg total) by mouth 2 (two) times daily with breakfast and dinner. ? chlorthalidone (HYGROTEN) 50 mg tablet Take 1 tablet (50 mg total) by mouth daily. ? diclofenac (VOLTAREN) 1 % gel Apply topically 4 (four) times daily. ? dulaglutide (TRULICITY) 1.5 mg/0.5 mL pen injector Inject 0.5 mLs (1.5 mg total) under the skin once a week. ? ergocalciferol (VITAMIN D2) 1,250 mcg (50,000 unit) capsule Take 1 capsule (50,000 Units total) by mouth once a week. (Patient not taking: Reported on 12/02/2020) ? 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 Please dispense supplies covered by patient's insurance. ? lidocaine maalox diphenhydramine nystatin (MAGIC MOUTHWASH + NYSTATIN) 1:1:1:1 Swish and swallow 10 mLs 4 (four) times daily. (Patient not taking: Reported on 12/02/2020) ? magnesium oxide (MAG-OX) 400 mg (241.3 mg magnesium) tablet Take 1 tablet (400 mg total) by mouth 2 (two) times daily. ? mycophenolate mofetil (CELLCEPT) 250 mg capsule Take 3 capsules (750 mg total) by mouth 2 (two) times daily. ? omeprazole (PRILOSEC) 20 mg capsule Take 1 capsule (20 mg total) by mouth daily. ? predniSONE (DELTASONE) 5 mg tablet Take 1 tablet (5 mg total) by mouth daily. Take after completion of taper. ? tacrolimus XR (ENVARSUS XR) 1 mg 24 hr tablet Take 1 tablet (1 mg total) by mouth daily. Take 1 mg to make dose to make total 9 mg daily. ? tacrolimus XR (ENVARSUS XR) 4 mg 24 hr tablet Take 2 tablets (8 mg total) by mouth daily. ? urea (CARMOL) 20 % cream Apply topically as needed. No current facility-administered medications for this visit. Patient Active Problem List Diagnosis ? FSGS (focal segmental glomerulosclerosis) ? Hyperparathyroidism (HC Code) (HC CODE) (HC Code) ? ROD (renal osteodystrophy) ? Hypertension ? Hypertriglyceridemia ? Obese ? 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 Colon polyps ? Internal hemorrhoids ? Mediastinal mass ? Arteriovenous fistula stenosis, subsequent encounter ? AVF (arteriovenous fistula) (HC Code) ? Alkaline phosphatase raised ? Decreased testosterone level ? Gastroesophageal reflux disease ? Lipoprotein deficiency disorder ? Newborn affected by abnormality in fetal (  intrauterine) heart rate or rhythm, unspecified as to time of onset ? Deceased-donor kidney transplant recipient ? Immunosuppressive management encounter following kidney transplant ? AKI (acute kidney injury) (HC Code) (HC CODE) (HC Code) ? Kidney transplant complication ? Kidney replaced by transplant ? 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 Combined form of senile cataract, unspecified laterality No Known AllergiesSocial Hx:  reports that he has quit smoking. His smoking use included cigarettes. He has a 6.25 pack-year smoking history. He has never used smokeless tobacco. He reports that he does not drink alcohol and does not use drugs.No family history on file.Vital SignsBP 121/68 (Site: r a, Position: Sitting, Cuff Size: Large)  - Pulse (!) 92  - Temp 98 ?F (36.7 ?C) (Temporal)  - Ht 5' 8 (1.727 m)  - Wt 120.7 kg  - SpO2 100%  - BMI 40.45 kg/m? Wt Readings from Last 3 Encounters: 03/03/21 120.3 kg 12/19/20 123.4 kg 12/08/20 125 kg  Physical Exam Pt is in good spirits. Constitutional:  oriented to person, place, and time.  R lower Ext: scab on R shin, no erythema, no edema.Psychiatric: Normal mood and affect.Pertinent Labs/Imaging:Lab Results Component Value Date  WBC 6.8 03/02/2021  HGB 13.5 03/02/2021  HCT 41.80 03/02/2021  PLT 237 03/02/2021  NA 140 03/02/2021  K 3.4 03/02/2021  CL 95 (L) 03/02/2021  CREATININE 2.09 (H) 03/02/2021  CALCIUM 8.8 03/02/2021  MG 1.7 03/02/2021  CHOL 137 08/05/2020  TRIG 209 (H) 08/05/2020  HDL 36 (L) 08/05/2020  LDL 59 08/05/2020  ALT 16 29/56/2130  AST 15 11/18/2019  TSH 2.080 06/05/2020  GLU 93 03/02/2021  HGBA1C 6.4 (H) 01/19/2021 Lab Results Component Value Date  HGBA1C 6.4 (H) 01/19/2021  HGBA1C 6.6 (H) 12/19/2020  HGBA1C 7.3 (H) 08/05/2020 Impression: 62 y.o. male with PMHx of ESRD (FSGS), s/p kidney transplant on 11/05/2019, S/p PTx prior to TXPL, HTN, dyslipidemia, OSA, COVID-19.Prior ant-DM Hx: stopped Tradjenta to start Trulicity.Plan: # Diabetes: 1) Glucose: most recent A1c target. Will try to maximize Trulicity:Trulicity 3 mg weekly- SMBG weekly.- A1c prior to next visit.2) Hyperlipidemia:  LDL=59. On atorvastatin 40 mg qd.3) Blood Pressure: at target. 4) Eyes: eye exam (11/26/2020): no DR.5) Neuropathy: monofilament test exam (09/2020): normal sensation.

## 2021-04-14 MED FILL — CARVEDILOL IMMEDIATE RELEASE 25 MG TABLET: 25 mg | ORAL | 30 days supply | Qty: 60 | Fill #5 | Status: CP

## 2021-04-14 MED FILL — PREDNISONE 5 MG TABLET: 5 mg | ORAL | 30 days supply | Qty: 30 | Fill #10 | Status: CP

## 2021-04-14 MED FILL — OMEPRAZOLE 20 MG CAPSULE,DELAYED RELEASE: 20 mg | ORAL | 90 days supply | Qty: 90 | Fill #3 | Status: CP

## 2021-04-14 MED FILL — ATOVAQUONE 750 MG/5 ML ORAL SUSPENSION: 750 mg/5 mL | ORAL | 30 days supply | Qty: 300 | Fill #10 | Status: CP

## 2021-04-27 MED ORDER — TRULICITY 3 MG/0.5 ML SUBCUTANEOUS PEN INJECTOR
30.5 mg/0.5 mL | 4 refills | Status: AC
Start: 2021-04-27 — End: 2021-08-07
  Filled 2021-06-18: qty 6, 84d supply, fill #1
  Filled 2021-06-18: qty 6, 84d supply, fill #2

## 2021-04-27 MED FILL — MYCOPHENOLATE MOFETIL 250 MG CAPSULE: 250 mg | ORAL | 90 days supply | Qty: 540 | Fill #5 | Status: CP

## 2021-05-14 ENCOUNTER — Encounter: Admit: 2021-05-14 | Payer: PRIVATE HEALTH INSURANCE | Attending: Nephrology | Primary: Family Medicine

## 2021-05-14 DIAGNOSIS — Z940 Kidney transplant status: Secondary | ICD-10-CM

## 2021-05-14 DIAGNOSIS — R790 Abnormal level of blood mineral: Secondary | ICD-10-CM

## 2021-05-14 DIAGNOSIS — N186 End stage renal disease: Secondary | ICD-10-CM

## 2021-05-14 DIAGNOSIS — Z796 Long-term use of immunosuppressant medication: Secondary | ICD-10-CM

## 2021-05-14 DIAGNOSIS — R12 Heartburn: Secondary | ICD-10-CM

## 2021-05-14 DIAGNOSIS — Z79899 Other long term (current) drug therapy: Secondary | ICD-10-CM

## 2021-05-14 MED ORDER — ATORVASTATIN 40 MG TABLET
40 mg | ORAL_TABLET | Freq: Every day | ORAL | 4 refills | 90.00 days | Status: AC
Start: 2021-05-14 — End: ?
  Filled 2021-05-22: qty 90, 90d supply, fill #4
  Filled 2021-05-22: qty 90, 90d supply, fill #0

## 2021-05-14 MED ORDER — MAGNESIUM OXIDE 400 MG (241.3 MG MAGNESIUM) TABLET
400241.3 mg (241.3 mg magnesium) | ORAL_TABLET | Freq: Two times a day (BID) | ORAL | 4 refills | 30.00 days | Status: AC
Start: 2021-05-14 — End: 2021-09-08

## 2021-05-14 MED ORDER — OMEPRAZOLE 20 MG CAPSULE,DELAYED RELEASE
20 mg | ORAL_CAPSULE | Freq: Every day | ORAL | 4 refills | 90.00 days | Status: AC
Start: 2021-05-14 — End: ?
  Filled 2021-07-20: qty 90, 90d supply, fill #0

## 2021-05-14 MED FILL — PREDNISONE 5 MG TABLET: 5 mg | ORAL | 30 days supply | Qty: 30 | Fill #11 | Status: CP

## 2021-05-14 MED FILL — AMLODIPINE 10 MG TABLET: 10 mg | ORAL | 90 days supply | Qty: 90 | Fill #2 | Status: CP

## 2021-05-14 MED FILL — CARVEDILOL IMMEDIATE RELEASE 25 MG TABLET: 25 mg | ORAL | 30 days supply | Qty: 60 | Fill #6 | Status: CP

## 2021-05-14 MED FILL — TACROLIMUS XR 4 MG TABLET,EXTENDED RELEASE 24 HR: 4 mg | ORAL | 90 days supply | Qty: 180 | Fill #10 | Status: CP

## 2021-05-14 MED FILL — ATOVAQUONE 750 MG/5 ML ORAL SUSPENSION: 750 mg/5 mL | ORAL | 30 days supply | Qty: 300 | Fill #12 | Status: CP

## 2021-05-14 MED FILL — TACROLIMUS XR 1 MG TABLET,EXTENDED RELEASE 24 HR: 1 mg | ORAL | 90 days supply | Qty: 90 | Fill #2 | Status: CP

## 2021-05-14 MED FILL — ATOVAQUONE 750 MG/5 ML ORAL SUSPENSION: 750 mg/5 mL | ORAL | 30 days supply | Qty: 300 | Fill #11 | Status: CP

## 2021-05-22 ENCOUNTER — Inpatient Hospital Stay: Admit: 2021-05-22 | Discharge: 2021-05-22 | Payer: PRIVATE HEALTH INSURANCE | Primary: Internal Medicine

## 2021-05-22 ENCOUNTER — Encounter: Admit: 2021-05-22 | Payer: PRIVATE HEALTH INSURANCE | Attending: Internal Medicine | Primary: Family Medicine

## 2021-05-22 DIAGNOSIS — Z298 Encounter for other specified prophylactic measures: Secondary | ICD-10-CM

## 2021-05-22 DIAGNOSIS — Z940 Kidney transplant status: Secondary | ICD-10-CM

## 2021-05-22 DIAGNOSIS — E291 Testicular hypofunction: Secondary | ICD-10-CM

## 2021-05-22 DIAGNOSIS — Z2989 Need for prophylactic immunotherapy: Secondary | ICD-10-CM

## 2021-05-22 LAB — CBC WITH AUTO DIFFERENTIAL
BKR WAM ABSOLUTE IMMATURE GRANULOCYTES.: 0.03 x 1000/??L (ref 0.00–0.30)
BKR WAM ABSOLUTE LYMPHOCYTE COUNT.: 1.02 x 1000/??L (ref 0.60–3.70)
BKR WAM ABSOLUTE NRBC (2 DEC): 0 x 1000/??L (ref 0.00–1.00)
BKR WAM ANALYZER ANC: 4.85 x 1000/??L (ref 2.00–7.60)
BKR WAM BASOPHIL ABSOLUTE COUNT.: 0.03 x 1000/??L (ref 0.00–1.00)
BKR WAM BASOPHILS: 0.5 % (ref 0.0–1.4)
BKR WAM EOSINOPHIL ABSOLUTE COUNT.: 0.06 x 1000/??L (ref 0.00–1.00)
BKR WAM EOSINOPHILS: 0.9 % (ref 0.0–5.0)
BKR WAM HEMATOCRIT (2 DEC): 44.4 % (ref 38.50–50.00)
BKR WAM HEMOGLOBIN: 14.4 g/dL (ref 13.2–17.1)
BKR WAM IMMATURE GRANULOCYTES: 0.5 % (ref 0.0–1.0)
BKR WAM LYMPHOCYTES: 15.5 % — ABNORMAL LOW (ref 17.0–50.0)
BKR WAM MCH (PG): 30.6 pg — ABNORMAL HIGH (ref 27.0–33.0)
BKR WAM MCHC: 32.4 g/dL (ref 31.0–36.0)
BKR WAM MCV: 94.5 fL — ABNORMAL HIGH (ref 80.0–100.0)
BKR WAM MONOCYTE ABSOLUTE COUNT.: 0.59 x 1000/??L (ref 0.00–1.00)
BKR WAM MONOCYTES: 9 % (ref 4.0–12.0)
BKR WAM MPV: 10 fL (ref 8.0–12.0)
BKR WAM NEUTROPHILS: 73.6 % — ABNORMAL HIGH (ref 39.0–72.0)
BKR WAM NUCLEATED RED BLOOD CELLS: 0 % (ref 0.0–1.0)
BKR WAM PLATELETS: 235 x1000/??L (ref 150–420)
BKR WAM RDW-CV: 13.7 % (ref 11.0–15.0)
BKR WAM RED BLOOD CELL COUNT.: 4.7 M/??L (ref 4.00–6.00)
BKR WAM WHITE BLOOD CELL COUNT: 6.6 x1000/??L (ref 4.0–11.0)

## 2021-05-22 LAB — BASIC METABOLIC PANEL
BKR ANION GAP: 16 (ref 7–17)
BKR BLOOD UREA NITROGEN: 21 mg/dL — ABNORMAL HIGH (ref 6–20)
BKR BUN / CREAT RATIO: 10.3 (ref 8.0–23.0)
BKR CALCIUM: 8.6 mg/dL — ABNORMAL LOW (ref 8.8–10.2)
BKR CHLORIDE: 98 mmol/L (ref 98–107)
BKR CO2: 27 mmol/L (ref 20–30)
BKR CREATININE: 2.04 mg/dL — ABNORMAL HIGH (ref 0.40–1.30)
BKR EGFR, CREATININE (CKD-EPI 2021): 37 mL/min/{1.73_m2} — ABNORMAL LOW (ref >=60–420)
BKR GLUCOSE: 91 mg/dL (ref 70–100)
BKR POTASSIUM: 3.3 mmol/L (ref 3.3–5.3)
BKR SODIUM: 141 mmol/L (ref 136–144)
BKR WAM ABSOLUTE IMMATURE GRANULOCYTES.: 141 mmol/L (ref 136–144)

## 2021-05-22 LAB — TACROLIMUS LEVEL     (BH GH L LMW YH): BKR TACROLIMUS BLOOD: 7.2 ng/mL (ref 0.60–3.70)

## 2021-05-22 LAB — MAGNESIUM: BKR MAGNESIUM: 1.6 mg/dL — ABNORMAL LOW (ref 1.7–2.4)

## 2021-05-22 LAB — PHOSPHORUS     (BH GH L LMW YH): BKR PHOSPHORUS: 4.3 mg/dL (ref 2.2–4.5)

## 2021-05-22 LAB — TESTOSTERONE, TOTAL     (BH GH L LMW YH): BKR TESTOSTERONE TOTAL: 226 ng/dL (ref 192–902)

## 2021-05-22 MED FILL — MAGNESIUM OXIDE 400 MG (241.3 MG MAGNESIUM) TABLET: 400 mg (241.3 mg magnesium) | ORAL | 30 days supply | Qty: 60 | Fill #0 | Status: CP

## 2021-06-02 ENCOUNTER — Encounter: Admit: 2021-06-02 | Payer: PRIVATE HEALTH INSURANCE | Primary: Family Medicine

## 2021-06-02 ENCOUNTER — Inpatient Hospital Stay: Admit: 2021-06-02 | Discharge: 2021-06-02 | Payer: PRIVATE HEALTH INSURANCE | Primary: Internal Medicine

## 2021-06-02 ENCOUNTER — Ambulatory Visit
Admit: 2021-06-02 | Payer: PRIVATE HEALTH INSURANCE | Attending: Student in an Organized Health Care Education/Training Program | Primary: Family Medicine

## 2021-06-02 DIAGNOSIS — E291 Testicular hypofunction: Secondary | ICD-10-CM

## 2021-06-02 DIAGNOSIS — Z940 Kidney transplant status: Secondary | ICD-10-CM

## 2021-06-02 DIAGNOSIS — Z7960 Long term (current) use of unspecified immunomodulators and immunosuppressants: Secondary | ICD-10-CM

## 2021-06-02 LAB — CBC WITH AUTO DIFFERENTIAL
BKR WAM ABSOLUTE IMMATURE GRANULOCYTES.: 0.02 x 1000/ÂµL (ref 0.00–0.30)
BKR WAM ABSOLUTE LYMPHOCYTE COUNT.: 0.96 x 1000/??L (ref 0.60–3.70)
BKR WAM ABSOLUTE NRBC (2 DEC): 0 x 1000/??L (ref 0.00–1.00)
BKR WAM ANALYZER ANC: 4.54 x 1000/??L (ref 2.00–7.60)
BKR WAM BASOPHIL ABSOLUTE COUNT.: 0.02 x 1000/??L (ref 0.00–1.00)
BKR WAM BASOPHILS: 0.3 % (ref 0.0–1.4)
BKR WAM EOSINOPHIL ABSOLUTE COUNT.: 0.07 x 1000/ÂµL (ref 0.00–1.00)
BKR WAM EOSINOPHILS: 1.1 % (ref 0.0–5.0)
BKR WAM HEMATOCRIT (2 DEC): 43.6 % (ref 38.50–50.00)
BKR WAM HEMOGLOBIN: 14.1 g/dL (ref 13.2–17.1)
BKR WAM IMMATURE GRANULOCYTES: 0.3 % (ref 0.0–1.0)
BKR WAM LYMPHOCYTES: 15.5 % — ABNORMAL LOW (ref 17.0–50.0)
BKR WAM MCH (PG): 30.6 pg (ref 27.0–33.0)
BKR WAM MCHC: 32.3 g/dL (ref 31.0–36.0)
BKR WAM MCV: 94.6 fL (ref 80.0–100.0)
BKR WAM MONOCYTE ABSOLUTE COUNT.: 0.58 x 1000/??L (ref 0.00–1.00)
BKR WAM MONOCYTES: 9.4 % — ABNORMAL LOW (ref 4.0–12.0)
BKR WAM MPV: 9.2 fL — ABNORMAL HIGH (ref 8.0–12.0)
BKR WAM NEUTROPHILS: 73.4 % — ABNORMAL HIGH (ref 39.0–72.0)
BKR WAM NUCLEATED RED BLOOD CELLS: 0 % (ref 0.0–1.0)
BKR WAM PLATELETS: 222 x1000/??L (ref 150–420)
BKR WAM RDW-CV: 13.8 % (ref 11.0–15.0)
BKR WAM RED BLOOD CELL COUNT.: 4.61 M/??L (ref 4.00–6.00)
BKR WAM WHITE BLOOD CELL COUNT: 6.2 x1000/??L (ref 4.0–11.0)

## 2021-06-02 LAB — TESTOSTERONE, TOTAL     (BH GH L LMW YH): BKR TESTOSTERONE TOTAL: 256 ng/dL (ref 192–902)

## 2021-06-02 LAB — BASIC METABOLIC PANEL
BKR ANION GAP: 13 g/dL (ref 7–17)
BKR BLOOD UREA NITROGEN: 18 mg/dL (ref 6–20)
BKR BUN / CREAT RATIO: 8.3 (ref 8.0–23.0)
BKR CALCIUM: 8.4 mg/dL — ABNORMAL LOW (ref 8.8–10.2)
BKR CHLORIDE: 98 mmol/L (ref 98–107)
BKR CO2: 30 mmol/L (ref 20–30)
BKR CREATININE: 2.17 mg/dL — ABNORMAL HIGH (ref 0.40–1.30)
BKR EGFR, CREATININE (CKD-EPI 2021): 34 mL/min/{1.73_m2} — ABNORMAL LOW (ref >=60)
BKR GLUCOSE: 93 mg/dL (ref 70–100)
BKR POTASSIUM: 3.2 mmol/L — ABNORMAL LOW (ref 3.3–5.3)
BKR SODIUM: 141 mmol/L (ref 136–144)

## 2021-06-02 LAB — ALBUMIN/CREATININE PANEL, URINE, RANDOM
BKR ALBUMIN, URINE, RANDOM: 547.1 mg/L (ref 0.00–0.30)
BKR ALBUMIN/CREATININE RATIO, URINE, RANDOM: 264.9 mg/g{creat} — ABNORMAL HIGH (ref <30.0)
BKR CREATININE, URINE, RANDOM: 207 mg/dL

## 2021-06-02 LAB — PROTEIN, TOTAL W/CREATININE, URINE, RANDOM     (BH GH LMW YH)
BKR CREATININE, URINE, RANDOM: 207 mg/dL
BKR PROTEIN URINE RANDOM: 0.8 g/L
BKR PROTEIN/CREATININE RATIO, URINE, RANDOM: 0.39 mg/mg Cr — ABNORMAL HIGH (ref <30.0)

## 2021-06-02 LAB — PHOSPHORUS     (BH GH L LMW YH): BKR PHOSPHORUS: 4.1 mg/dL (ref 2.2–4.5)

## 2021-06-02 LAB — MAGNESIUM: BKR MAGNESIUM: 1.7 mg/dL (ref 1.7–2.4)

## 2021-06-02 LAB — TACROLIMUS LEVEL     (BH GH L LMW YH): BKR TACROLIMUS BLOOD: 9.5 ng/mL (ref 0.00–1.00)

## 2021-06-02 NOTE — Progress Notes
MD unavailable, verbal obtained from Dr. Smith Robert orders placed.

## 2021-06-03 LAB — BK VIRUS BY PCR, QUANTITATIVE, PLASMA: BKR BK VIRUS, QUANTITATIVE PCR, PLASMA: NOT DETECTED mmol/L (ref 20–30)

## 2021-06-04 NOTE — Progress Notes
[facility] TRANSPLANT CENTERKIDNEY/PANCREAS TRANSPLANT PROGRAMMEDICAL FOLLOW UP VISIT CHIEF COMPLAINT/REASON FOR VISIT VONDELL BABERS is seen in clinic to for a follow up visit regarding:- Evaluation of kidney allograft function - Assessment of immunosuppression medication efficacy and side effects- Assessment and management of blood pressureTRANSPLANT HISTORY ?	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	CMV D + / R + ?	Donor Toxo positive ?	CPRA zero, Alemtuzumab ?	9/4-9/7: admit for hyperK+, urgent HD. 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 Channon is doing ok - he reports ongoing knee pain. hes had Xrays and has been referred to a provider in [place] but he doesn't remember the name or specialty. He also reports occ diarrhea with trulicity. On questioning he reports not using CPAP consistently REVIEW OF SYSTEMS  General: no generalized weakness, fatigue, fever or chillsCardio: no chest pain, shortness of breath bipedal edemaPulm: no cough, sputum production or wheezingGI: no nausea, vomiting, diarrhea or constipation GU: no dysuria or hematuriaSkin: no new rashes or skin lesionsNeuro: no focal weakness, tremors, speech disturbanceMEDICAL HISTORY Past Medical History: Diagnosis Date ? A-V fistula (HC Code)   right arm ? Anemia  ? 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 Overview:  Added automatically from request for surgery [id number] Overview:  Added automatically from request for surgery [id number] ? Anemia in ESRD (end-stage renal disease) (HC Code) 10/24/2014  Added automatically from request for surgery [id number]  Added automatically from request for surgery [id number] Overview:  Added automatically from request for surgery [id number] ? AV fistula infection (HC Code) (HC CODE) (HC Code)  ? Cirrhosis (HC Code) (HC CODE) (HC Code)  ? ESRD on hemodialysis (HC Code) (HC CODE) (HC Code)   Mon-Wed-Fri ? GERD (gastroesophageal reflux disease)  ? Hypercholesteremia  ? Hyperparathyroidism (HC Code) (HC CODE) (HC Code)  ? Hypertension  ? Infected prosthetic vascular graft, initial encounter (HC Code) (HC CODE) (HC Code) 10/24/2014 ? Influenza A  ? Morbid obesity (HC Code) (HC CODE) (HC Code) 02/03/2018 ? Obstructive sleep apnea   pt uses CPAP ? Secondary hyperparathyroidism of renal origin (HC Code) (HC CODE) (HC Code)  ? Thoracic aortic aneurysm   Past Surgical History: Procedure Laterality Date ? AV FISTULA PLACEMENT Left   left lower arm unsuccessful, then moved to upper arm 2011, then had problems in 10/2013 - infection  No family history on file. Social History Tobacco Use ? Smoking status: Former   Packs/day: 0.25   Years: 25.00   Pack years: 6.25   Types: Cigarettes ? Smokeless tobacco: Never Substance Use Topics ? Alcohol use: No  Past medical, surgical, family and social history reviewed. No changes at this time.MEDICATIONS Outpatient Encounter Medications as of 06/02/2021 Medication Sig ? amLODIPine (NORVASC) 10 mg tablet Take 1 tablet (10 mg total) by mouth every morning. ? atorvastatin (LIPITOR) 40 mg tablet Take 1 tablet (40 mg total) by mouth daily. ? atovaquone (MEPRON) 750 mg/5 mL suspension Take 10 mLs (1,500 mg total) by mouth daily with dinner. ? 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 Please dispense supplies covered by patient's insurance. ? carvediloL (COREG) 25 mg Immediate Release tablet Take 1 tablet (25 mg total) by mouth 2 (two) times daily with breakfast and dinner. ? chlorthalidone (  HYGROTEN) 50 mg tablet Take 1 tablet (50 mg total) by mouth daily. ? diclofenac (VOLTAREN) 1 % gel Apply topically 4 (four) times daily. ? dulaglutide (TRULICITY) 3 mg/0.5 mL PnIj Inject 3 mg under the skin once a week. ? dulaglutide 3 mg/0.5 mL PnIj Inject 0.5 mLs (3 mg total) under the skin every 7 days. ? ergocalciferol (VITAMIN D2) 1,250 mcg (50,000 unit) capsule Take 1 capsule (50,000 Units total) by mouth once a week. (Patient not taking: Reported on 12/02/2020) ? 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 Please dispense supplies covered by patient's insurance. ? lidocaine maalox diphenhydramine nystatin (MAGIC MOUTHWASH + NYSTATIN) 1:1:1:1 Swish and swallow 10 mLs 4 (four) times daily. (Patient not taking: Reported on 12/02/2020) ? magnesium oxide (MAG-OX) 400 mg (241.3 mg magnesium) tablet Take 1 tablet (400 mg total) by mouth 2 (two) times daily. ? mycophenolate mofetil (CELLCEPT) 250 mg capsule Take 3 capsules (750 mg total) by mouth 2 (two) times daily. ? omeprazole (PRILOSEC) 20 mg capsule Take 1 capsule (20 mg total) by mouth daily. ? predniSONE (DELTASONE) 5 mg tablet Take 1 tablet (5 mg total) by mouth daily. Take after completion of taper. ? tacrolimus XR (ENVARSUS XR) 1 mg 24 hr tablet Take 1 tablet (1 mg total) by mouth daily. Take with 2 x 4 mg tablets for a total daily dose of 9mg . ? tacrolimus XR (ENVARSUS XR) 4 mg 24 hr tablet Take 2 tablets (8 mg total) by mouth daily. ? urea (CARMOL) 20 % cream Apply topically as needed. No Known AllergiesPHYSICAL EXAM  BP 138/61 (Site: l a, Position: Sitting, Cuff Size: Large)  - Pulse 77  - Temp 97.2 ?F (36.2 ?C) (Temporal)  - Ht 5' 8 (1.727 m)  - Wt 118 kg  - SpO2 95%  - BMI 39.55 kg/m? Wt Readings from Last 4 Encounters: 06/02/21 118 kg 04/10/21 120.7 kg 03/03/21 120.3 kg 12/19/20 123.4 kg   Gen: obese male , not in acute distressHEENT: atraumatic, pale palpebral conjunctivaNeck: no cervical lymphadenopathy, trachea midlineCV: regular rate and rhythm, distinct S1 and S2, no murmurs appreciated Resp: clear breath sounds bilaterally, no wheezing or crackles appreciatedAbd: non-distended, normoactive bowel sounds, non-tender to palpationExt: no pedal edemaNeuro: Awake, alert, no tremors, motor function grossly intactPERTINENT LABS Lab Results Component Value Date  WBC 6.2 06/02/2021  HGB 14.1 06/02/2021  HCT 43.60 06/02/2021  PLT 222 06/02/2021  GLU 93 06/02/2021  BUN 18 06/02/2021  CREATININE 2.17 (H) 06/02/2021  CO2 30 06/02/2021  CL 98 06/02/2021  NA 141 06/02/2021  K 3.2 (L) 06/02/2021  CALCIUM 8.4 (L) 06/02/2021  EGFR 8 06/21/2012  PRCRRAU 0.39 (H) 06/02/2021  PHOS 4.1 06/02/2021  PTH 35.2 05/21/2020  VITAMIND25HY 23 06/05/2020  HGBA1C 6.4 (H) 01/19/2021  IRON 80 01/30/2020  TIBC 275 01/30/2020  FERRITIN 1,163 (H) 01/30/2020 Lab Results Component Value Date  TACROLIMUS 7.2 05/22/2021 ASSESSMENT AND PLAN GARRETH BURNSWORTH is seen in clinic today for a follow up visit regarding the patiet's renal transplant.Allograft functionBL CR 1.8-2.1 Last UPCR 0.39 UACR 264 Last biopsy 01/02/21 - ATI Hypokalemic - recommended increasing K intake. Etiology is likely medication related (on chlorthalidoneImmunosuppressionEnvarsus 9 mg, MMF 750 mg BID, pred 5 mg Env level appropriate Prophylaxis and SurveillanceBK and CMV ND 8/18/22Blood Pressure managementAmlodipine 10 mg, coreg 25 mg BID, chlorthalidone 50 mgAnemiaStable Counseled on importance of using CPAP Zuriel Roskos RaoTransplant Nephrology Fellow3/21/2023

## 2021-06-09 NOTE — Progress Notes
Nursing Assessment Follow Up Visit: Kidney Transplant Program Subjective: Adam Harper (61 y.o. 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 Denies fever, chills, chest pain, shortness of breath, or abdominal pain. No hematuria or dysuria noted by [facility]. Urinating without symptoms of obstruction or infection. Reports compliance with current medications.Current Transplant Immunosuppression:Immunosuppression     Start End  mycophenolate mofetil (CELLCEPT) 250 mg capsule 08/04/2020   Sig - Route: Take 3 capsules (750 mg total) by mouth 2 (two) times daily. - Oral  Notes to Pharmacy: Kidney Transplant Z94.0.  predniSONE (DELTASONE) 5 mg tablet 05/21/2020   Sig - Route: Take 1 tablet (5 mg total) by mouth daily. Take after completion of taper. - Oral  Notes to Pharmacy: Kidney Transplant Z94.0  tacrolimus XR (ENVARSUS XR) 1 mg 24 hr tablet 11/07/2020   Sig - Route: Take 1 tablet (1 mg total) by mouth daily. Take 1 mg to make dose to make total 9 mg daily. - Oral  Notes to Pharmacy: z94.0  tacrolimus XR (ENVARSUS XR) 4 mg 24 hr tablet 06/21/2020   Sig - Route: Take 2 tablets (8 mg total) by mouth daily. - Oral  Transplant Prophylaxis       atovaquone (MEPRON) 750 mg/5 mL suspension Take 10 mLs (1,500 mg total) by mouth daily with dinner.  Number of times this order has been changed since signing: 15   Order Audit Trail   lidocaine maalox diphenhydramine nystatin (MAGIC MOUTHWASH + NYSTATIN) 1:1:1:1 Swish and swallow 10 mLs 4 (four) times daily.  Number of times this order has been changed since signing: 2   Order Audit Trail     No Known AllergiesObjective: Vital Signs: BP 132/65 (Site: l a, Position: Sitting, Cuff Size: Large)  - Pulse 70  - Temp 97.6 ?F (36.4 ?C) (Temporal)  - Ht 5' 8 (1.727 m)  - Wt 124.3 kg  - SpO2 94%  - BMI 41.66 kg/m?  Most recent creatinine: Lab Results Component Value Date  CREATININE 2.41 (H) 10/30/2020 Lab Results Component Value Date  TACROLIMUS 6.2 10/30/2020 Prograf level is a 23.5  hour trough. Env 9 mg daily Transplant graft is functioning. Assessment: Adam Harper is a 61 y.o. male here today for follow-up of kidney transplant. Continues with immunosuppression regimen and is at risk for infection.The patient has not had any hospitalizations since their last visit. Physical Capacity: No LimitationsKarnofsky Status: 80% - Normal Activity with Effort: Some Symptoms of DiseaseEmployment Status: No; DisabilityThe patient has not developed diabetes since their last visit. The patient has not had dialysis since their last follow-up. The patient has not had organ transplant rejection since their last follow-up. The patient has not had any evidence of malignancy since their last follow-up. Plan: Review labs with clinic attending. [name] will be notified of any immunosuppression changes/treatments based on lab results.[name] was provided education on maintaining medication compliance and post-transplant care. Patient was advised to contact the transplant center with any concerns. The patient verbalized understanding and intent to comply with the plan of care. Electronically Signed by Ivar Drape, RN, December 02, 2020

## 2021-06-09 NOTE — Progress Notes
[facility] [place] TRANSPLANT CENTERKIDNEY/PANCREAS TRANSPLANT PROGRAMMEDICAL FOLLOW UP VISIT CHIEF COMPLAINT/REASON FOR VISIT AUTHUR CUBIT is seen in clinic to for a follow up visit regarding:- Evaluation of kidney allograft function - Assessment of immunosuppression medication efficacy and side effects- Assessment and management of blood pressureTRANSPLANT HISTORY ?	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	CMV D + / R + ?	Donor Toxo positive ?	CPRA zero, Alemtuzumab ?	9/4-9/7: admit for hyperK+, urgent HD. 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 He has no major complaints He has an appointment for sleep med on 12/08/20 He denies dysuria, hematuria, fevers, chills, vomiting, diarrhea He is adherent with medications REVIEW OF SYSTEMS  General: no generalized weakness, fatigue, fever or chillsCardio: no chest pain, shortness of breath bipedal edemaPulm: no cough, sputum production or wheezingGI: no nausea, vomiting, diarrhea or constipation GU: no dysuria or hematuriaSkin: no new rashes or skin lesionsNeuro: no focal weakness, tremors, speech disturbanceMEDICAL HISTORY Past Medical History: Diagnosis Date ? A-V fistula (HC Code) (HC CODE) (HC Code)   right arm ? Anemia  ? 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 Overview:  Added automatically from request for surgery [id number] Overview:  Added automatically from request for surgery [id number] ? Anemia in ESRD (end-stage renal disease) (HC Code) 10/24/2014  Added automatically from request for surgery [id number]  Added automatically from request for surgery [id number] Overview:  Added automatically from request for surgery [id number] ? AV fistula infection (HC Code) (HC CODE) (HC Code)  ? Cirrhosis (HC Code) (HC CODE) (HC Code)  ? ESRD on hemodialysis (HC Code) (HC CODE) (HC Code)   Mon-Wed-Fri ? GERD (gastroesophageal reflux disease)  ? Hypercholesteremia  ? Hyperparathyroidism (HC Code) (HC CODE) (HC Code)  ? Hypertension  ? Infected prosthetic vascular graft, initial encounter (HC Code) (HC CODE) (HC Code) 10/24/2014 ? Influenza A  ? Morbid obesity (HC Code) (HC CODE) (HC Code) 02/03/2018 ? Obstructive sleep apnea   pt uses CPAP ? Secondary hyperparathyroidism of renal origin (HC Code) (HC CODE) (HC Code)  ? Thoracic aortic aneurysm (HC Code)   Past Surgical History: Procedure Laterality Date ? AV FISTULA PLACEMENT Left   left lower arm unsuccessful, then moved to upper arm 2011, then had problems in 10/2013 - infection  No family history on file. Social History Tobacco Use ? Smoking status: Former Smoker   Packs/day: 0.25   Years: 25.00   Pack years: 6.25   Types: Cigarettes ? Smokeless tobacco: Never Used Substance Use Topics ? Alcohol use: No  Past medical, surgical, family and social history reviewed. No changes at this time.MEDICATIONS Outpatient Encounter Medications as of 12/02/2020 Medication Sig ? amLODIPine (NORVASC) 10 mg tablet Take 1 tablet (10 mg total) by mouth every morning. ? atorvastatin (LIPITOR) 40 mg tablet Take 1 tablet (40 mg total) by mouth daily. ? atovaquone (MEPRON) 750 mg/5 mL suspension Take 10 mLs (1,500 mg total) by mouth daily with dinner. ? 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 Please dispense glucometer covered by patient's insurance. ? 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 Please dispense supplies covered by patient's insurance. ? carvediloL (COREG) 25 mg Immediate Release tablet Take 1 tablet (  25 mg total) by mouth 2 (two) times daily with breakfast and dinner. ? chlorthalidone (HYGROTEN) 50 mg tablet Take 1 tablet (50 mg total) by mouth daily. ? diclofenac (VOLTAREN) 1 % gel Apply topically 4 (four) times daily. ? ergocalciferol (VITAMIN D2) 1,250 mcg (50,000 unit) capsule Take 1 capsule (50,000 Units total) by mouth once a week. ? furosemide (LASIX) 40 mg tablet Take 1 tablet (40 mg total) by mouth daily. ? 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 Please dispense supplies covered by patient's insurance. ? lidocaine maalox diphenhydramine nystatin (MAGIC MOUTHWASH + NYSTATIN) 1:1:1:1 Swish and swallow 10 mLs 4 (four) times daily. (Patient taking differently: Swish and swallow 10 mLs as needed.) ? linaGLIPtin (TRADJENTA) 5 mg Tab tablet Take 1 tablet (5 mg total) by mouth daily. ? magnesium oxide (MAG-OX) 400 mg (241.3 mg magnesium) tablet Take 1 tablet (400 mg total) by mouth 2 (two) times daily. ? mycophenolate mofetil (CELLCEPT) 250 mg capsule Take 3 capsules (750 mg total) by mouth 2 (two) times daily. ? omeprazole (PRILOSEC) 20 mg capsule Take 1 capsule (20 mg total) by mouth daily. ? predniSONE (DELTASONE) 5 mg tablet Take 1 tablet (5 mg total) by mouth daily. Take after completion of taper. ? tacrolimus XR (ENVARSUS XR) 1 mg 24 hr tablet Take 1 tablet (1 mg total) by mouth daily. Take 1 mg to make dose to make total 9 mg daily. ? tacrolimus XR (ENVARSUS XR) 4 mg 24 hr tablet Take 2 tablets (8 mg total) by mouth daily. ? urea (CARMOL) 20 % cream Apply topically as needed. No Known AllergiesPHYSICAL EXAM  BP 132/65 (Site: l a, Position: Sitting, Cuff Size: Large)  - Pulse 70  - Temp 97.6 ?F (36.4 ?C) (Temporal)  - Ht 5' 8 (1.727 m)  - Wt 124.3 kg  - SpO2 94%  - BMI 41.66 kg/m? Wt Readings from Last 4 Encounters: 12/02/20 124.3 kg 10/28/20 126.1 kg 09/26/20 123.9 kg 09/16/20 124.5 kg   Gen: obese male , not in acute distressHEENT: atraumatic, pale palpebral conjunctivaNeck: no cervical lymphadenopathy, trachea midlineCV: regular rate and rhythm, distinct S1 and S2, no murmurs appreciated Resp: clear breath sounds bilaterally, no wheezing or crackles appreciatedAbd: non-distended, normoactive bowel sounds, non-tender to palpationExt: no pedal edemaNeuro: Awake, alert, no tremors, motor function grossly intactPERTINENT LABS Lab Results Component Value Date  WBC 6.1 10/30/2020  HGB 13.0 (L) 10/30/2020  HCT 40.30 10/30/2020  PLT 212 10/30/2020  GLU 130 (H) 10/30/2020  BUN 24 (H) 10/30/2020  CREATININE 2.41 (H) 10/30/2020  CO2  10/30/2020    Comment:    Does not meet clinical time requirements.  CL 98 10/30/2020  NA 142 10/30/2020  K 3.0 (L) 10/30/2020  CALCIUM 9.3 10/30/2020  EGFR 8 06/21/2012  PRCRRAU 0.16 (H) 10/30/2020  PHOS 3.3 10/30/2020  PTH 35.2 05/21/2020  VITAMIND25HY 23 06/05/2020  HGBA1C 7.3 (H) 08/05/2020  IRON 80 01/30/2020  TIBC 275 01/30/2020  FERRITIN 1,163 (H) 01/30/2020 Lab Results Component Value Date  TACROLIMUS 6.2 10/30/2020 ASSESSMENT AND PLAN DEAGAN SEVIN is seen in clinic today for a follow up visit regarding the patiet's renal transplant.Allograft functionBL CR 1.8-2.1 Last UPCR 0.16 UACR 78.2 Last biopsy ACR 1a 05/14/20 IF/TA 5%Will see what his labs show today, last creatinine was higher than prior baseline We will plan for a biopsy as surveillance given lack of improvement in kidney function following treatment of ACR1AImmunosuppressionEnvarsus 9 mg, MMF 750 mg BID, pred 5 mg Follow  env level Prophylaxis and SurveillanceBK and CMV ND 8/18/22Blood Pressure managementAmlodipine 10 mg, coreg 25 mg BID, chlorthalidone 50 mg, lasix 40 mg AnemiaStable MBD Ca mag phos acceptable  Mardy Hoppe RaoTransplant Nephrology Fellow9/20/2022 10:03 AM

## 2021-06-16 ENCOUNTER — Encounter: Admit: 2021-06-16 | Payer: PRIVATE HEALTH INSURANCE | Attending: Nephrology | Primary: Family Medicine

## 2021-06-18 MED ORDER — PREDNISONE 5 MG TABLET
5 mg | ORAL_TABLET | Freq: Every day | ORAL | 12 refills | 30.00 days | Status: AC
Start: 2021-06-18 — End: ?
  Filled 2021-06-18: qty 30, 30d supply, fill #0

## 2021-06-18 MED FILL — MAGNESIUM OXIDE 400 MG (241.3 MG MAGNESIUM) TABLET: 400 mg (241.3 mg magnesium) | ORAL | 30 days supply | Qty: 60 | Fill #1 | Status: CP

## 2021-06-18 MED FILL — CARVEDILOL IMMEDIATE RELEASE 25 MG TABLET: 25 mg | ORAL | 30 days supply | Qty: 60 | Fill #7 | Status: CP

## 2021-06-18 MED FILL — CHLORTHALIDONE 50 MG TABLET: 50 mg | ORAL | 90 days supply | Qty: 90 | Fill #2 | Status: CP

## 2021-06-18 MED FILL — CARVEDILOL IMMEDIATE RELEASE 25 MG TABLET: 25 mg | ORAL | 30 days supply | Qty: 60 | Fill #12 | Status: CP

## 2021-07-01 ENCOUNTER — Encounter
Admit: 2021-07-01 | Payer: PRIVATE HEALTH INSURANCE | Attending: Student in an Organized Health Care Education/Training Program | Primary: Family Medicine

## 2021-07-01 MED ORDER — ATOVAQUONE 750 MG/5 ML ORAL SUSPENSION
7505 mg/5 mL | ORAL | 12 refills | 30.00 days | Status: AC
Start: 2021-07-01 — End: ?
  Filled 2021-07-01: qty 300, 30d supply, fill #0

## 2021-07-20 MED FILL — PREDNISONE 5 MG TABLET: 5 mg | ORAL | 30 days supply | Qty: 30 | Fill #1 | Status: CP

## 2021-07-20 MED FILL — MYCOPHENOLATE MOFETIL 250 MG CAPSULE: 250 mg | ORAL | 90 days supply | Qty: 540 | Fill #6 | Status: CP

## 2021-07-20 MED FILL — MYCOPHENOLATE MOFETIL 250 MG CAPSULE: 250 mg | ORAL | 90 days supply | Qty: 240 | Fill #10 | Status: CP

## 2021-07-20 MED FILL — MAGNESIUM OXIDE 400 MG (241.3 MG MAGNESIUM) TABLET: 400 mg (241.3 mg magnesium) | ORAL | 30 days supply | Qty: 60 | Fill #4 | Status: CP

## 2021-07-20 MED FILL — CARVEDILOL IMMEDIATE RELEASE 25 MG TABLET: 25 mg | ORAL | 30 days supply | Qty: 60 | Fill #8 | Status: CP

## 2021-07-20 MED FILL — MAGNESIUM OXIDE 400 MG (241.3 MG MAGNESIUM) TABLET: 400 mg (241.3 mg magnesium) | ORAL | 30 days supply | Qty: 60 | Fill #2 | Status: CP

## 2021-07-29 ENCOUNTER — Encounter: Admit: 2021-07-29 | Payer: PRIVATE HEALTH INSURANCE | Primary: Family Medicine

## 2021-07-31 MED FILL — ATOVAQUONE 750 MG/5 ML ORAL SUSPENSION: 750 mg/5 mL | ORAL | 30 days supply | Qty: 300 | Fill #1 | Status: CP

## 2021-08-07 ENCOUNTER — Encounter: Admit: 2021-08-07 | Payer: PRIVATE HEALTH INSURANCE | Attending: "Endocrinology | Primary: Family Medicine

## 2021-08-07 ENCOUNTER — Inpatient Hospital Stay: Admit: 2021-08-07 | Discharge: 2021-08-07 | Payer: PRIVATE HEALTH INSURANCE | Primary: Internal Medicine

## 2021-08-07 ENCOUNTER — Ambulatory Visit: Admit: 2021-08-07 | Payer: PRIVATE HEALTH INSURANCE | Attending: "Endocrinology | Primary: Family Medicine

## 2021-08-07 ENCOUNTER — Encounter: Admit: 2021-08-07 | Payer: PRIVATE HEALTH INSURANCE | Attending: Nephrology | Primary: Family Medicine

## 2021-08-07 DIAGNOSIS — I770 Arteriovenous fistula, acquired: Secondary | ICD-10-CM

## 2021-08-07 DIAGNOSIS — E291 Testicular hypofunction: Secondary | ICD-10-CM

## 2021-08-07 DIAGNOSIS — E213 Hyperparathyroidism, unspecified: Secondary | ICD-10-CM

## 2021-08-07 DIAGNOSIS — I10 Essential (primary) hypertension: Secondary | ICD-10-CM

## 2021-08-07 DIAGNOSIS — Z940 Kidney transplant status: Secondary | ICD-10-CM

## 2021-08-07 DIAGNOSIS — E119 Type 2 diabetes mellitus without complications: Secondary | ICD-10-CM

## 2021-08-07 DIAGNOSIS — N186 End stage renal disease: Secondary | ICD-10-CM

## 2021-08-07 DIAGNOSIS — I712 Thoracic aortic aneurysm (HC Code): Secondary | ICD-10-CM

## 2021-08-07 DIAGNOSIS — Z7960 Long term (current) use of unspecified immunomodulators and immunosuppressants: Secondary | ICD-10-CM

## 2021-08-07 DIAGNOSIS — T827XXA Infection and inflammatory reaction due to other cardiac and vascular devices, implants and grafts, initial encounter: Secondary | ICD-10-CM

## 2021-08-07 DIAGNOSIS — J101 Influenza due to other identified influenza virus with other respiratory manifestations: Secondary | ICD-10-CM

## 2021-08-07 DIAGNOSIS — D649 Anemia, unspecified: Secondary | ICD-10-CM

## 2021-08-07 DIAGNOSIS — E7800 Pure hypercholesterolemia, unspecified: Secondary | ICD-10-CM

## 2021-08-07 DIAGNOSIS — G4733 Obstructive sleep apnea (adult) (pediatric): Secondary | ICD-10-CM

## 2021-08-07 DIAGNOSIS — N2581 Secondary hyperparathyroidism of renal origin: Secondary | ICD-10-CM

## 2021-08-07 DIAGNOSIS — K219 Gastro-esophageal reflux disease without esophagitis: Secondary | ICD-10-CM

## 2021-08-07 DIAGNOSIS — K7460 Unspecified cirrhosis of liver: Secondary | ICD-10-CM

## 2021-08-07 LAB — CBC WITH AUTO DIFFERENTIAL
BKR CREATININE, URINE, RANDOM: 0.03 x 1000/??L (ref 0.00–0.30)
BKR PROTEIN/CREATININE RATIO, URINE, RANDOM: 0.1 x 1000/??L — ABNORMAL HIGH (ref <30.0)
BKR SODIUM: 14.8 g/dL (ref 13.2–17.1)
BKR WAM ABSOLUTE IMMATURE GRANULOCYTES.: 0.03 x 1000/??L (ref 0.00–0.30)
BKR WAM ABSOLUTE LYMPHOCYTE COUNT.: 0.73 x 1000/??L (ref 0.60–3.70)
BKR WAM ABSOLUTE NRBC (2 DEC): 0 x 1000/??L (ref 0.00–1.00)
BKR WAM ANALYZER ANC: 6.57 x 1000/??L (ref 2.00–7.60)
BKR WAM BASOPHIL ABSOLUTE COUNT.: 0.03 x 1000/ÂµL (ref 0.00–1.00)
BKR WAM BASOPHILS: 0.4 % (ref 0.0–1.4)
BKR WAM EOSINOPHIL ABSOLUTE COUNT.: 0.1 x 1000/ÂµL (ref 0.00–1.00)
BKR WAM EOSINOPHILS: 1.2 % (ref 0.0–5.0)
BKR WAM HEMATOCRIT (2 DEC): 44.4 % (ref 38.50–50.00)
BKR WAM HEMOGLOBIN: 14.8 g/dL (ref 13.2–17.1)
BKR WAM IMMATURE GRANULOCYTES: 0.4 % (ref 0.0–1.0)
BKR WAM LYMPHOCYTES: 9.1 % — ABNORMAL LOW (ref 17.0–50.0)
BKR WAM MCH (PG): 30.3 pg (ref 27.0–33.0)
BKR WAM MCHC: 33.3 g/dL (ref 31.0–36.0)
BKR WAM MCV: 91 fL — ABNORMAL LOW (ref 80.0–100.0)
BKR WAM MONOCYTE ABSOLUTE COUNT.: 0.58 x 1000/ÂµL (ref 0.00–1.00)
BKR WAM MONOCYTES: 7.2 % (ref 4.0–12.0)
BKR WAM MPV: 9.6 fL — ABNORMAL HIGH (ref 8.0–12.0)
BKR WAM NEUTROPHILS: 81.7 % — ABNORMAL HIGH (ref 39.0–72.0)
BKR WAM NUCLEATED RED BLOOD CELLS: 0 % (ref 0.0–1.0)
BKR WAM PLATELETS: 226 x1000/??L — ABNORMAL HIGH (ref 150–420)
BKR WAM RDW-CV: 13.4 % (ref 11.0–15.0)
BKR WAM RED BLOOD CELL COUNT.: 4.88 M/??L (ref 4.00–6.00)
BKR WAM WHITE BLOOD CELL COUNT: 8 x1000/??L (ref 4.0–11.0)

## 2021-08-07 LAB — BASIC METABOLIC PANEL
BKR ANION GAP: 15 g/dL (ref 7–17)
BKR BLOOD UREA NITROGEN: 22 mg/dL — ABNORMAL HIGH (ref 6–20)
BKR BUN / CREAT RATIO: 10.9 (ref 8.0–23.0)
BKR CALCIUM: 8.5 mg/dL — ABNORMAL LOW (ref 8.8–10.2)
BKR CHLORIDE: 97 mmol/L — ABNORMAL LOW (ref 98–107)
BKR CO2: 30 mmol/L (ref 20–30)
BKR CREATININE: 2.02 mg/dL — ABNORMAL HIGH (ref 0.40–1.30)
BKR EGFR, CREATININE (CKD-EPI 2021): 37 mL/min/{1.73_m2} — ABNORMAL LOW (ref >=60)
BKR GLUCOSE: 125 mg/dL — ABNORMAL HIGH (ref 70–100)
BKR POTASSIUM: 3.5 mmol/L (ref 3.3–5.3)

## 2021-08-07 LAB — ALBUMIN/CREATININE PANEL, URINE, RANDOM
BKR ALBUMIN, URINE, RANDOM: 810 mg/L (ref 0.00–0.30)
BKR ALBUMIN/CREATININE RATIO, URINE, RANDOM: 307.3 mg/g Cr — ABNORMAL HIGH (ref <30.0)
BKR CREATININE, URINE, RANDOM: 264 mg/dL

## 2021-08-07 LAB — TESTOSTERONE, TOTAL     (BH GH L LMW YH): BKR TESTOSTERONE TOTAL: 264 ng/dL

## 2021-08-07 LAB — PROTEIN, TOTAL W/CREATININE, URINE, RANDOM     (BH GH LMW YH)
BKR CREATININE, URINE, RANDOM: 264 mg/dL
BKR PROTEIN URINE RANDOM: 1.14 g/L
BKR PROTEIN/CREATININE RATIO, URINE, RANDOM: 0.43 mg/mg{creat} — ABNORMAL HIGH (ref <0.10)

## 2021-08-07 LAB — PHOSPHORUS     (BH GH L LMW YH): BKR PHOSPHORUS: 2.9 mg/dL (ref 2.2–4.5)

## 2021-08-07 LAB — MAGNESIUM: BKR MAGNESIUM: 2 mg/dL (ref 1.7–2.4)

## 2021-08-07 MED ORDER — DULAGLUTIDE 3 MG/0.5 ML SUBCUTANEOUS PEN INJECTOR
30.5 mg/0.5 mL | SUBCUTANEOUS | 4 refills | 84.00 days | Status: AC
Start: 2021-08-07 — End: ?
  Filled 2021-10-02: qty 6, 84d supply, fill #0

## 2021-08-07 MED ORDER — BLOOD SUGAR DIAGNOSTIC STRIPS
4 refills | Status: AC
Start: 2021-08-07 — End: ?

## 2021-08-07 MED ORDER — LANCETS
4 refills | Status: AC
Start: 2021-08-07 — End: ?

## 2021-08-07 NOTE — Progress Notes
Adam Harper returns to the Transplant[facility]End[facility]ology Clinic for a f/u visit for DM management. He is 61 y.o. 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  Pt denies Hx of DM, but A1c in the diabetic range since 05/2020 (pre-diabetes at time of TXPL), and diagnosis of DM 12 months ago (05/2020).  He has no complications from diabetes.Visit translated by Spanish interpreter via [facility] video (ID # [id number])Interim Hx:Pt states tolerating Trulicity well. Sometimes diarrhea. However, he does not think it is from Trulicity, he believes is due to certain foods.DM Regimen: Trulicity 3 mg qdHypoglycemia: n/aSMBG: Glucose log (One Touch Reveal): 14-day average: 115, 100% in range.Exercise: walking (1-2x/week).Review of Systems Constitutional: Negative for fever. Some weight loss.Eyes: Negative for visual disturbance. Respiratory: Negative for cough or shortness of breath.Cardiovascular: Negative for chest pain. Denies abdominal pain, N/V.Neurological: Negative for numbness. Lower ext: Scab on R shin - healed.Current Outpatient Medications Medication Sig ? amLODIPine (NORVASC) 10 mg tablet Take 1 tablet (10 mg total) by mouth every morning. ? atorvastatin (LIPITOR) 40 mg tablet Take 1 tablet (40 mg total) by mouth daily. ? atovaquone (MEPRON) 750 mg/5 mL suspension Take 10 mLs (1,500 mg total) by mouth daily with dinner. ? 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 Please dispense supplies covered by patient's insurance. ? carvediloL (COREG) 25 mg Immediate Release tablet Take 1 tablet (25 mg total) by mouth 2 (two) times daily with breakfast and dinner. ? chlorthalidone (HYGROTEN) 50 mg tablet Take 1 tablet (50 mg total) by mouth daily. ? diclofenac (VOLTAREN) 1 % gel Apply topically 4 (four) times daily. ? dulaglutide (TRULICITY) 3 mg/0.5 mL PnIj Inject 3 mg under the skin once a week. ? dulaglutide 3 mg/0.5 mL PnIj Inject 0.5 mLs (3 mg total) under the skin every 7 days. ? ergocalciferol (VITAMIN D2) 1,250 mcg (50,000 unit) capsule Take 1 capsule (50,000 Units total) by mouth once a week. (Patient not taking: Reported on 12/02/2020) ? 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 Please dispense supplies covered by patient's insurance. ? lidocaine maalox diphenhydramine nystatin (MAGIC MOUTHWASH + NYSTATIN) 1:1:1:1 Swish and swallow 10 mLs 4 (four) times daily. (Patient not taking: Reported on 12/02/2020) ? magnesium oxide (MAG-OX) 400 mg (241.3 mg magnesium) tablet Take 1 tablet (400 mg total) by mouth 2 (two) times daily. ? mycophenolate mofetil (CELLCEPT) 250 mg capsule Take 3 capsules (750 mg total) by mouth 2 (two) times daily. ? omeprazole (PRILOSEC) 20 mg capsule Take 1 capsule (20 mg total) by mouth daily. ? predniSONE (DELTASONE) 5 mg tablet Take 1 tablet (5 mg total) by mouth daily. ? tacrolimus XR (ENVARSUS XR) 1 mg 24 hr tablet Take 1 tablet (1 mg total) by mouth daily. Take with 2 x 4 mg tablets for a total daily dose of 9mg . ? tacrolimus XR (ENVARSUS XR) 4 mg 24 hr tablet Take 2 tablets (8 mg total) by mouth daily. ? urea (CARMOL) 20 % cream Apply topically as needed. No current facility-administered medications for this visit. Patient Active Problem List Diagnosis ? FSGS (focal segmental glomerulosclerosis) ? Hyperparathyroidism (HC Code) ? ROD (renal osteodystrophy) ? Hypertension ? Hypertriglyceridemia ? Obese ? 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 Colon polyps ? Internal hemorrhoids ? Mediastinal mass ? Arteriovenous fistula stenosis, subsequent encounter ? AVF (arteriovenous fistula) (HC Code) ?  Alkaline phosphatase raised ? Decreased testosterone level ? Gastroesophageal reflux disease ? Lipoprotein deficiency disorder ? Newborn affected by abnormality in fetal (intrauterine) heart rate or rhythm, unspecified as to time of onset ? Deceased-donor kidney transplant recipient ? Immunosuppressive management encounter following kidney transplant ? AKI (acute kidney injury) (HC Code) (HC CODE) (HC Code) ? Kidney transplant complication ? Kidney replaced by transplant ? 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 Combined form of senile cataract, unspecified laterality No Known AllergiesSocial Hx:  reports that he has quit smoking. His smoking use included cigarettes. He has a 6.25 pack-year smoking history. He has never used smokeless tobacco. He reports that he does not drink alcohol and does not use drugs.No family history on file.Vital SignsBP (!) 113/54  - Pulse (!) 94  - Temp 97.3 ?F (36.3 ?C)  - Ht 5' 8 (1.727 m)  - Wt 120.8 kg  - SpO2 95%  - BMI 40.51 kg/m? Wt Readings from Last 3 Encounters: 06/02/21 118 kg 04/10/21 120.7 kg 03/03/21 120.3 kg  Physical Exam Pt is in good spirits. Constitutional:  oriented to person, place, and time.  Psychiatric: Normal mood and affect.Pertinent Labs/Imaging:Lab Results Component Value Date  WBC 6.2 06/02/2021  HGB 14.1 06/02/2021  HCT 43.60 06/02/2021  PLT 222 06/02/2021  NA 141 06/02/2021  K 3.2 (L) 06/02/2021  CL 98 06/02/2021  CREATININE 2.17 (H) 06/02/2021  CALCIUM 8.4 (L) 06/02/2021  MG 1.7 06/02/2021  CHOL 137 08/05/2020  TRIG 209 (H) 08/05/2020  HDL 36 (L) 08/05/2020  LDL 59 08/05/2020  ALT 16 29/56/2130  AST 15 11/18/2019  TSH 2.080 06/05/2020  GLU 93 06/02/2021  HGBA1C 6.4 (H) 01/19/2021 Lab Results Component Value Date  HGBA1C 6.4 (H) 01/19/2021  HGBA1C 6.6 (H) 12/19/2020  HGBA1C 7.3 (H) 08/05/2020 07/23/2021: A1c=5.8Impression: 61 y.o. 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 Will hold off maximizing Trulicity due to c/o diarrhea. If symtoms resolve/improve, would increase to 4.5 mg weekly:Continue Trulicity 3 mg weekly- SMBG 2-3x/week.- A1c prior to next visit.2) Hyperlipidemia:  LDL=59 (07/2020). On atorvastatin 40 mg qd.3) Blood Pressure: at target. 4) Eyes: eye exam (11/26/2020): no DR.5) Neuropathy: monofilament test exam (09/2020): normal sensation. Pt will be discharged from Transplant-Endocrinology Clinic. Diabetes management will be transferred back to PCP. Pt is welcome to return to Endo clinic if symptoms get worse or for any new Endo-related issues.

## 2021-08-08 LAB — TACROLIMUS LEVEL     (BH GH L LMW YH): BKR TACROLIMUS BLOOD: 22.4 ng/mL — ABNORMAL HIGH (ref 0.00–1.00)

## 2021-08-11 LAB — BK VIRUS BY PCR, QUANTITATIVE, PLASMA: BKR BK VIRUS, QUANTITATIVE PCR, PLASMA: NOT DETECTED

## 2021-08-12 ENCOUNTER — Encounter: Admit: 2021-08-12 | Payer: PRIVATE HEALTH INSURANCE | Attending: Nephrology | Primary: Family Medicine

## 2021-08-13 MED ORDER — TACROLIMUS XR 4 MG TABLET,EXTENDED RELEASE 24 HR
4 mg | ORAL_TABLET | Freq: Every day | ORAL | 12 refills | 30.00 days | Status: AC
Start: 2021-08-13 — End: ?
  Filled 2021-08-14: qty 60, 30d supply, fill #0

## 2021-08-14 ENCOUNTER — Encounter: Admit: 2021-08-14 | Payer: PRIVATE HEALTH INSURANCE | Primary: Family Medicine

## 2021-08-14 MED FILL — AMLODIPINE 10 MG TABLET: 10 mg | ORAL | 90 days supply | Qty: 90 | Fill #3 | Status: CP

## 2021-08-14 MED FILL — PREDNISONE 5 MG TABLET: 5 mg | ORAL | 30 days supply | Qty: 30 | Fill #2 | Status: CP

## 2021-08-14 MED FILL — CARVEDILOL IMMEDIATE RELEASE 25 MG TABLET: 25 mg | ORAL | 30 days supply | Qty: 60 | Fill #9 | Status: CP

## 2021-08-14 MED FILL — MAGNESIUM OXIDE 400 MG (241.3 MG MAGNESIUM) TABLET: 400 mg (241.3 mg magnesium) | ORAL | 30 days supply | Qty: 60 | Fill #3 | Status: CP

## 2021-08-14 MED FILL — ATORVASTATIN 40 MG TABLET: 40 mg | ORAL | 90 days supply | Qty: 90 | Fill #1 | Status: CP

## 2021-08-14 MED FILL — TACROLIMUS XR 1 MG TABLET,EXTENDED RELEASE 24 HR: 1 mg | ORAL | 90 days supply | Qty: 90 | Fill #3 | Status: CP

## 2021-08-14 NOTE — Progress Notes
Tac level is 22.4 - spoke with pt's wife about the tac level. Asked patient to repeat the tac level prior to his apt with Dr. Lucianne Muss. Pt's wife agreed with plan.

## 2021-08-17 ENCOUNTER — Inpatient Hospital Stay: Admit: 2021-08-17 | Discharge: 2021-08-17 | Payer: PRIVATE HEALTH INSURANCE | Primary: Internal Medicine

## 2021-08-17 DIAGNOSIS — Z940 Kidney transplant status: Secondary | ICD-10-CM

## 2021-08-17 DIAGNOSIS — Z796 Long-term use of immunosuppressant medication: Secondary | ICD-10-CM

## 2021-08-17 DIAGNOSIS — E291 Testicular hypofunction: Secondary | ICD-10-CM

## 2021-08-17 LAB — CBC WITH AUTO DIFFERENTIAL
BKR WAM ABSOLUTE IMMATURE GRANULOCYTES.: 0.02 x 1000/??L (ref 0.00–0.30)
BKR WAM ABSOLUTE LYMPHOCYTE COUNT.: 0.9 x 1000/??L (ref 0.60–3.70)
BKR WAM ABSOLUTE NRBC (2 DEC): 0 x 1000/??L (ref 0.00–1.00)
BKR WAM ANALYZER ANC: 4.93 x 1000/??L (ref 2.00–7.60)
BKR WAM BASOPHIL ABSOLUTE COUNT.: 0.03 x 1000/??L (ref 0.00–1.00)
BKR WAM BASOPHILS: 0.5 % (ref 0.0–1.4)
BKR WAM EOSINOPHIL ABSOLUTE COUNT.: 0.12 x 1000/??L (ref 0.00–1.00)
BKR WAM EOSINOPHILS: 1.8 % (ref 0.0–5.0)
BKR WAM HEMATOCRIT (2 DEC): 46.5 % — ABNORMAL LOW (ref 38.50–50.00)
BKR WAM HEMOGLOBIN: 14.9 g/dL (ref 13.2–17.1)
BKR WAM IMMATURE GRANULOCYTES: 0.3 % (ref 0.0–1.0)
BKR WAM LYMPHOCYTES: 13.8 % — ABNORMAL LOW (ref 17.0–50.0)
BKR WAM MCH (PG): 30.3 pg (ref 27.0–33.0)
BKR WAM MCHC: 32 g/dL (ref 31.0–36.0)
BKR WAM MCV: 94.7 fL — ABNORMAL LOW (ref 80.0–100.0)
BKR WAM MONOCYTE ABSOLUTE COUNT.: 0.52 x 1000/??L (ref 0.00–1.00)
BKR WAM MONOCYTES: 8 % — ABNORMAL LOW (ref 4.0–12.0)
BKR WAM MPV: 10.5 fL (ref 8.0–12.0)
BKR WAM NEUTROPHILS: 75.6 % — ABNORMAL HIGH (ref 39.0–72.0)
BKR WAM NUCLEATED RED BLOOD CELLS: 0 % (ref 0.0–1.0)
BKR WAM PLATELETS: 210 x1000/ÂµL (ref 150–420)
BKR WAM RDW-CV: 13.5 % — ABNORMAL HIGH (ref 11.0–15.0)
BKR WAM RED BLOOD CELL COUNT.: 4.91 M/??L (ref 4.00–6.00)
BKR WAM WHITE BLOOD CELL COUNT: 6.5 x1000/??L (ref 4.0–11.0)

## 2021-08-17 LAB — ALBUMIN/CREATININE PANEL, URINE, RANDOM
BKR ALBUMIN, URINE, RANDOM: 436.4 mg/L
BKR ALBUMIN/CREATININE RATIO, URINE, RANDOM: 327.1 mg/g{creat} — ABNORMAL HIGH (ref <30.0)
BKR CREATININE, URINE, RANDOM: 133 mg/dL (ref 0.00–1.00)

## 2021-08-17 LAB — BASIC METABOLIC PANEL
BKR ANION GAP: 15 g/dL (ref 7–17)
BKR BLOOD UREA NITROGEN: 25 mg/dL — ABNORMAL HIGH (ref 6–20)
BKR BUN / CREAT RATIO: 11.2 (ref 8.0–23.0)
BKR CALCIUM: 9.2 mg/dL (ref 8.8–10.2)
BKR CHLORIDE: 95 mmol/L — ABNORMAL LOW (ref 98–107)
BKR CO2: 29 mmol/L (ref 20–30)
BKR CREATININE: 2.24 mg/dL — ABNORMAL HIGH (ref 0.40–1.30)
BKR EGFR, CREATININE (CKD-EPI 2021): 33 mL/min/{1.73_m2} — ABNORMAL LOW (ref >=60)
BKR GLUCOSE: 110 mg/dL — ABNORMAL HIGH (ref 70–100)
BKR POTASSIUM: 3.2 mmol/L — ABNORMAL LOW (ref 3.3–5.3)
BKR SODIUM: 139 mmol/L (ref 136–144)

## 2021-08-17 LAB — TESTOSTERONE, TOTAL     (BH GH L LMW YH): BKR TESTOSTERONE TOTAL: 309 ng/dL

## 2021-08-17 LAB — TACROLIMUS LEVEL     (BH GH L LMW YH): BKR TACROLIMUS BLOOD: 8.2 ng/mL (ref 0.0–1.0)

## 2021-08-17 LAB — PROTEIN, TOTAL W/CREATININE, URINE, RANDOM     (BH GH LMW YH): BKR PROTEIN URINE RANDOM: 0.61 g/L (ref 0.00–1.00)

## 2021-08-17 LAB — MAGNESIUM: BKR MAGNESIUM: 1.9 mg/dL — ABNORMAL LOW (ref 1.7–2.4)

## 2021-08-17 LAB — PHOSPHORUS     (BH GH L LMW YH): BKR PHOSPHORUS: 4 mg/dL (ref 2.2–4.5)

## 2021-08-18 LAB — BK VIRUS BY PCR, QUANTITATIVE, PLASMA: BKR BK VIRUS, QUANTITATIVE PCR, PLASMA: NOT DETECTED

## 2021-08-31 ENCOUNTER — Inpatient Hospital Stay: Admit: 2021-08-31 | Discharge: 2021-08-31 | Payer: PRIVATE HEALTH INSURANCE | Primary: Internal Medicine

## 2021-08-31 DIAGNOSIS — Z7960 Long term (current) use of unspecified immunomodulators and immunosuppressants: Secondary | ICD-10-CM

## 2021-08-31 DIAGNOSIS — Z940 Kidney transplant status: Secondary | ICD-10-CM

## 2021-08-31 DIAGNOSIS — E291 Testicular hypofunction: Secondary | ICD-10-CM

## 2021-08-31 LAB — PHOSPHORUS     (BH GH L LMW YH): BKR PHOSPHORUS: 4.1 mg/dL (ref 2.2–4.5)

## 2021-08-31 LAB — CBC WITH AUTO DIFFERENTIAL
BKR WAM ABSOLUTE IMMATURE GRANULOCYTES.: 0.02 x 1000/??L (ref 0.00–0.30)
BKR WAM ABSOLUTE LYMPHOCYTE COUNT.: 0.9 x 1000/??L (ref 0.60–3.70)
BKR WAM ABSOLUTE NRBC (2 DEC): 0 x 1000/??L (ref 0.00–1.00)
BKR WAM ANALYZER ANC: 4.31 x 1000/??L (ref 2.00–7.60)
BKR WAM BASOPHIL ABSOLUTE COUNT.: 0.03 x 1000/??L (ref 0.00–1.00)
BKR WAM BASOPHILS: 0.5 % (ref 0.0–1.4)
BKR WAM EOSINOPHIL ABSOLUTE COUNT.: 0.13 x 1000/??L (ref 0.00–1.00)
BKR WAM EOSINOPHILS: 2.2 % (ref 0.0–5.0)
BKR WAM HEMATOCRIT (2 DEC): 44.6 % — ABNORMAL LOW (ref 38.50–50.00)
BKR WAM HEMOGLOBIN: 14.4 g/dL (ref 13.2–17.1)
BKR WAM IMMATURE GRANULOCYTES: 0.3 % (ref 0.0–1.0)
BKR WAM LYMPHOCYTES: 15.1 % — ABNORMAL LOW (ref 17.0–50.0)
BKR WAM MCH (PG): 30.8 pg (ref 27.0–33.0)
BKR WAM MCV: 95.3 fL (ref 80.0–100.0)
BKR WAM MONOCYTE ABSOLUTE COUNT.: 0.56 x 1000/??L (ref 0.00–1.00)
BKR WAM MONOCYTES: 9.4 % (ref 4.0–12.0)
BKR WAM MPV: 9.9 fL — ABNORMAL HIGH (ref 8.0–12.0)
BKR WAM NEUTROPHILS: 72.5 % — ABNORMAL HIGH (ref 39.0–72.0)
BKR WAM NUCLEATED RED BLOOD CELLS: 0 % (ref 0.0–1.0)
BKR WAM PLATELETS: 217 x1000/??L (ref 150–420)
BKR WAM RDW-CV: 13.4 % — ABNORMAL HIGH (ref 11.0–15.0)
BKR WAM RED BLOOD CELL COUNT.: 4.68 M/??L (ref 4.00–6.00)
BKR WAM WHITE BLOOD CELL COUNT: 6 x1000/??L (ref 4.0–11.0)

## 2021-08-31 LAB — BASIC METABOLIC PANEL
BKR ANION GAP: 15 (ref 7–17)
BKR BLOOD UREA NITROGEN: 19 mg/dL (ref 6–20)
BKR BUN / CREAT RATIO: 8.6 (ref 8.0–23.0)
BKR CALCIUM: 8.5 mg/dL — ABNORMAL LOW (ref 8.8–10.2)
BKR CHLORIDE: 99 mmol/L (ref 98–107)
BKR CO2: 28 mmol/L (ref 20–30)
BKR CREATININE: 2.2 mg/dL — ABNORMAL HIGH (ref 0.40–1.30)
BKR EGFR, CREATININE (CKD-EPI 2021): 34 mL/min/1.73m2 — ABNORMAL LOW (ref >=60–12.0)
BKR GLUCOSE: 107 mg/dL — ABNORMAL HIGH (ref 70–100)
BKR POTASSIUM: 3 mmol/L — ABNORMAL LOW (ref 3.3–5.3)
BKR SODIUM: 142 mmol/L (ref 136–144)
BKR WAM MCHC: 15 g/dL (ref 7–17)

## 2021-08-31 LAB — PROTEIN, TOTAL W/CREATININE, URINE, RANDOM     (BH GH LMW YH)
BKR CREATININE, URINE, RANDOM: 147 mg/dL
BKR PROTEIN URINE RANDOM: 0.58 g/L
BKR PROTEIN/CREATININE RATIO, URINE, RANDOM: 0.39 mg/mg Cr — ABNORMAL HIGH (ref <30.0)

## 2021-08-31 LAB — ALBUMIN/CREATININE PANEL, URINE, RANDOM
BKR ALBUMIN, URINE, RANDOM: 374.6 mg/L
BKR ALBUMIN/CREATININE RATIO, URINE, RANDOM: 254.1 mg/g{creat} — ABNORMAL HIGH (ref <30.0)
BKR CREATININE, URINE, RANDOM: 147 mg/dL

## 2021-08-31 LAB — MAGNESIUM: BKR MAGNESIUM: 1.9 mg/dL (ref 1.7–2.4)

## 2021-08-31 LAB — TESTOSTERONE, TOTAL     (BH GH L LMW YH): BKR TESTOSTERONE TOTAL: 226 ng/dL

## 2021-08-31 LAB — TACROLIMUS LEVEL     (BH GH L LMW YH): BKR TACROLIMUS BLOOD: 5.8 ng/mL

## 2021-09-01 ENCOUNTER — Ambulatory Visit: Admit: 2021-09-01 | Payer: PRIVATE HEALTH INSURANCE | Attending: Internal Medicine | Primary: Family Medicine

## 2021-09-01 ENCOUNTER — Encounter: Admit: 2021-09-01 | Payer: PRIVATE HEALTH INSURANCE | Primary: Family Medicine

## 2021-09-01 DIAGNOSIS — Z796 Long-term use of immunosuppressant medication: Secondary | ICD-10-CM

## 2021-09-01 DIAGNOSIS — Z940 Kidney transplant status: Secondary | ICD-10-CM

## 2021-09-01 DIAGNOSIS — I10 Essential (primary) hypertension: Secondary | ICD-10-CM

## 2021-09-01 DIAGNOSIS — Z5181 Encounter for therapeutic drug level monitoring: Secondary | ICD-10-CM

## 2021-09-01 LAB — BK VIRUS BY PCR, QUANTITATIVE, PLASMA: BKR BK VIRUS, QUANTITATIVE PCR, PLASMA: NOT DETECTED

## 2021-09-01 NOTE — Progress Notes
MD unavailable, verbal obtained from Dr. Lucianne Muss, orders placed.

## 2021-09-01 NOTE — Progress Notes
[name] Kidney/Pancreas Transplant Program Medical follow up Visit Chief complaint: [name] returns for follow up [id number] transplant function assessmentImmuno drug therapy requiring intensive monitoring for efficacy and toxicityBlood pressure managementImportant Transplant History: ?	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	CMV D + / R + ?	Donor Toxo positive ?	CPRA zero, Alemtuzumab ?	9/4-9/7: admit for hyperK+, urgent HD. Bactrim switched to Atovaquone?	Intermittent pre-syncopal events, holter monitoring.?	Stent removal 11/28/2019?	3/22: Biopsy: ACUTE CELLULAR REJECTION, BANFF TYPE IA ?	10/22: Biopsy: ACUTE TUBULAR INJURY, FOCALLast updated : 12/13/19 , Stanford Scotland, MD History of Present Illness:Adam Harper comes to the transplant clinic today for a routine clinic visit. Overall patient is doing very well and denies any specific complains. He is doing well.  Denies any complaints.PFSH reviewed and confirmed 09/01/2021 Review of SystemsIn addition, following review of systems was conducted : Patient denies any fevers or chills, no nausea or vomiting. No chest pain/ chest tightness, no cough/sputum or shortness of breath. No headaches, neck stiffness, no vision changes, no ear or nose discharge. No oral lesions. No constipation or diarrhea. No urinary complains like dysuria, hematuria, cloudy urine, reduced output. No skin rashes, no joint effusions. No significant weight gain or weight loss.Physical Exam  Vitals: There were no vitals taken for this visit.Wt Readings from Last 4 Encounters: 08/07/21 120.8 kg 06/02/21 118 kg 04/10/21 120.7 kg 03/03/21 120.3 kg GENERAL : Appears wellHEENT : EOMI , mucous membranes moist RESPIRATORY : NVBS heard bilaterallyCARDIOVASCULAR : S1 S2 + , no added sounds heard. ABDOMEN : Soft, non-tenderEXTREMITIES : No edemaLabs:Lab Results Component Value Date  CREATININE 2.20 (H) 08/31/2021  BUN 19 08/31/2021  NA 142 08/31/2021  K 3.0 (L) 08/31/2021  CL 99 08/31/2021  CO2 28 08/31/2021  Lab Results Component Value Date  WBC 6.0 08/31/2021  HGB 14.4 08/31/2021  HCT 44.60 08/31/2021  MCV 95.3 08/31/2021  PLT 217 08/31/2021 Lab Results Component Value Date  TACROLIMUS 5.8 08/31/2021 Most recent BK viral testing -- BK Virus Quantitative PCR (no units) Date Value 08/17/2021 Not Detected Most recent CMV viral testing -- Cytomegalovirus Quantitative PCR (no units) Date Value 01/19/2021 Not Detected Immunosuppression     Start End  mycophenolate mofetil (CELLCEPT) 250 mg capsule 08/04/2020   Sig - Route: Take 3 capsules (750 mg total) by mouth 2 (two) times daily. - Oral  Notes to Pharmacy: Kidney Transplant Z94.0.  predniSONE (DELTASONE) 5 mg tablet 06/18/2021   Sig - Route: Take 1 tablet (5 mg total) by mouth daily. - Oral  Notes to Pharmacy: Kidney Transplant Z94.0  tacrolimus XR (ENVARSUS XR) 1 mg 24 hr tablet 11/07/2020   Sig - Route: Take 1 tablet (1 mg total) by mouth daily. Take with 2 x 4 mg tablets for a total daily dose of 9mg . - Oral  Notes to Pharmacy: z94.0  Prior authorization: Approved  tacrolimus XR (ENVARSUS XR) 4 mg 24 hr tablet 08/13/2021   Sig - Route: Take 2 tablets (8 mg total) by mouth daily. Take with 1 mg tabs for total dose 9 mg daily - Oral  Antihypertensives     Start End  amLODIPine (NORVASC) 10 mg tablet 10/20/2020 11/12/2021  Sig - Route: Take 1 tablet (10 mg total) by mouth every morning. - Oral  carvediloL (COREG) 25 mg Immediate Release tablet 10/20/2020 10/20/2021  Sig - Route: Take 1 tablet (25 mg total) by mouth 2 (two) times daily with breakfast and dinner. - Oral  chlorthalidone (HYGROTEN) 50 mg tablet 10/20/2020 10/20/2021  Sig - Route:  Take 1 tablet (50 mg total) by mouth daily. - Oral Transplant Prophylaxis     Disp Refills Start End  atovaquone (MEPRON) 750 mg/5 mL suspension 300 mL 11 07/01/2021 06/26/2022  Sig - Route: Take 10 mLs (1,500 mg total) by mouth daily with dinner. - Oral  Prior authorization: Approved  lidocaine maalox diphenhydramine nystatin (MAGIC MOUTHWASH + NYSTATIN) 1:1:1:1 500 mL 0 06/05/2020   Sig - Route: Swish and swallow 10 mLs 4 (four) times daily. - Swish & Swallow  Viral monitoring : [place] A Cervone is Lab Results Component Value Date  CYTOMEGALOVI Positive (A) 10/02/2019 Assessment / PlanAllograft function: DDKT 11/05/19, Creatinine at 2.0 range.	- hypokalemia:  Secondary to chlorthalidone.  Advised him to increase potassium in his diet.Immunosuppression: envarsus 9mg /day, MMF 750mg  bid, prednisone 5mg /day	- tacrolimus drug level is 5.8.  No changes made..Hypertension:	- currently on amlodipine 10mg /day, coreg 25mg  bid and chlorthalidone 50mg /day	- clinic blood pressures are in 130s systolic range.  No changes made.Infection Prophylaxis: 	- on atovaquononeRTC in six months with Avel Sensor, MD, FASN, FASTAssistant Professor of MedicineTransplant nephrologist Section of NephrologyDepartment of Internal Medicine [facility] of [id number]: [phone]

## 2021-09-02 DIAGNOSIS — D849 Immunodeficiency, unspecified: Secondary | ICD-10-CM

## 2021-09-04 MED FILL — ATOVAQUONE 750 MG/5 ML ORAL SUSPENSION: 750 mg/5 mL | ORAL | 30 days supply | Qty: 300 | Fill #2 | Status: CP

## 2021-09-08 ENCOUNTER — Encounter: Admit: 2021-09-08 | Payer: PRIVATE HEALTH INSURANCE | Attending: Internal Medicine | Primary: Family Medicine

## 2021-09-08 DIAGNOSIS — Z79899 Other long term (current) drug therapy: Secondary | ICD-10-CM

## 2021-09-08 DIAGNOSIS — Z940 Kidney transplant status: Secondary | ICD-10-CM

## 2021-09-08 DIAGNOSIS — R790 Abnormal level of blood mineral: Secondary | ICD-10-CM

## 2021-09-08 MED ORDER — MAGNESIUM OXIDE 400 MG (241.3 MG MAGNESIUM) TABLET
400241.3 mg (241.3 mg magnesium) | ORAL_TABLET | Freq: Two times a day (BID) | ORAL | 4 refills | 30.00 days | Status: AC
Start: 2021-09-08 — End: 2022-02-26

## 2021-09-08 MED FILL — TACROLIMUS XR 4 MG TABLET,EXTENDED RELEASE 24 HR: 4 mg | ORAL | 30 days supply | Qty: 60 | Fill #1 | Status: CP

## 2021-09-08 MED FILL — CARVEDILOL IMMEDIATE RELEASE 25 MG TABLET: 25 mg | ORAL | 30 days supply | Qty: 60 | Fill #10 | Status: CP

## 2021-09-08 MED FILL — PREDNISONE 5 MG TABLET: 5 mg | ORAL | 30 days supply | Qty: 30 | Fill #3 | Status: CP

## 2021-09-11 MED FILL — MAGNESIUM OXIDE 400 MG (241.3 MG MAGNESIUM) TABLET: 400 mg (241.3 mg magnesium) | ORAL | 30 days supply | Qty: 60 | Fill #0 | Status: CP

## 2021-09-23 ENCOUNTER — Telehealth: Admit: 2021-09-23 | Payer: PRIVATE HEALTH INSURANCE | Primary: Family Medicine

## 2021-09-23 NOTE — Telephone Encounter
Notified by pharmacy that pt is reporting diarrhea, he feels from trulicity. Reached out to pt to discuss. He reports that diarrhea is directly related to when he takes his trulicity. Discussed him reaching out to his endocrinologist, however pt states his symptoms are tolerable and he does not want to. He agreed to call team if symptoms became unmanageable.

## 2021-09-30 ENCOUNTER — Ambulatory Visit: Admit: 2021-09-30 | Payer: BLUE CROSS/BLUE SHIELD | Attending: Cardiovascular Disease | Primary: Family Medicine

## 2021-10-02 ENCOUNTER — Encounter: Admit: 2021-10-02 | Payer: PRIVATE HEALTH INSURANCE | Attending: Cardiovascular Disease | Primary: Family Medicine

## 2021-10-02 ENCOUNTER — Ambulatory Visit: Admit: 2021-10-02 | Payer: PRIVATE HEALTH INSURANCE | Attending: Cardiovascular Disease | Primary: Family Medicine

## 2021-10-02 DIAGNOSIS — E213 Hyperparathyroidism, unspecified: Secondary | ICD-10-CM

## 2021-10-02 DIAGNOSIS — J101 Influenza due to other identified influenza virus with other respiratory manifestations: Secondary | ICD-10-CM

## 2021-10-02 DIAGNOSIS — K7460 Unspecified cirrhosis of liver: Secondary | ICD-10-CM

## 2021-10-02 DIAGNOSIS — T827XXA Infection and inflammatory reaction due to other cardiac and vascular devices, implants and grafts, initial encounter: Secondary | ICD-10-CM

## 2021-10-02 DIAGNOSIS — D649 Anemia, unspecified: Secondary | ICD-10-CM

## 2021-10-02 DIAGNOSIS — K219 Gastro-esophageal reflux disease without esophagitis: Secondary | ICD-10-CM

## 2021-10-02 DIAGNOSIS — G4733 Obstructive sleep apnea (adult) (pediatric): Secondary | ICD-10-CM

## 2021-10-02 DIAGNOSIS — I770 Arteriovenous fistula, acquired: Secondary | ICD-10-CM

## 2021-10-02 DIAGNOSIS — E7800 Pure hypercholesterolemia, unspecified: Secondary | ICD-10-CM

## 2021-10-02 DIAGNOSIS — I10 Essential (primary) hypertension: Secondary | ICD-10-CM

## 2021-10-02 DIAGNOSIS — I712 Thoracic aortic aneurysm (HC Code): Secondary | ICD-10-CM

## 2021-10-02 DIAGNOSIS — N1831 Chronic kidney disease, stage 3a: Secondary | ICD-10-CM

## 2021-10-02 DIAGNOSIS — N2581 Secondary hyperparathyroidism of renal origin: Secondary | ICD-10-CM

## 2021-10-02 DIAGNOSIS — N186 End stage renal disease: Secondary | ICD-10-CM

## 2021-10-02 MED FILL — DULAGLUTIDE 3 MG/0.5 ML SUBCUTANEOUS PEN INJECTOR: 3 mg/0.5 mL | SUBCUTANEOUS | 84 days supply | Qty: 6 | Fill #2 | Status: CP

## 2021-10-02 MED FILL — ATOVAQUONE 750 MG/5 ML ORAL SUSPENSION: 750 mg/5 mL | ORAL | 30 days supply | Qty: 300 | Fill #3 | Status: CP

## 2021-10-02 MED FILL — CHLORTHALIDONE 50 MG TABLET: 50 mg | ORAL | 90 days supply | Qty: 90 | Fill #3 | Status: CP

## 2021-10-02 NOTE — Progress Notes
Patient ID: [id number] Patient Name: FRANCESCA VOS of Birth: [birth year]Date of Service: 7/21/2023HISTORY OF PRESENT ILLNESS:  Patient is a 61 y.o. 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 A perfusion imaging stress test was obtained on January 02, 2015. This revealed a small mild basal to mid inferolateral fixed defect without evidence of ischemia. There was moderate LAD, circumflex and right coronary artery calcification. Aortic dimensions measured 4.1 cm at the proximal aortic root.?Echocardiogram obtained on August 24, 2016 revealed an ejection fraction greater than 70% without evidence of significant LVOT obstruction, mild mitral and tricuspid insufficiency, aortic sclerosis, maximal aortic dimensions of 4.1 centimeters,, mild left atrial enlargement, estimated RV systolic pressure of 25 millimeters of mercury.  Moderate concentric LVH.?[place] Chest completed on 05/25/2018 showed unchanged dilated ascending aorta to 4.1 cm. Severe three-vessel coronary arterial calcification. Indeterminant left renal nodular densities may represent hemorrhagic/proteinaceous cyst given. Recommend correlation with today's ultrasound. Further characterization can be performed with [place] or MRI renal mass protocol if warranted. 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 Dedicated thymic MRI can be performed for further characterization as clinically indicated.?US Abdominal Aorta completed on 05/25/2018 showed the proximal abdominal aorta measures 2.6 x 2.4 cm in size. The mid abdominal aorta measures 2.2 x 1.9 cm in size. No evidence for abdominal aortic aneurysm.?Echocardiogram obtained November 10, 2018. Mildly increased left ventricular cavity size. Normal left ventricular systolic function. Severe concentric left ventricular hypertrophy. LVEF calculated by 3DE was 68%. Normal right ventricular cavity size and systolic function. Right ventricular systolic pressure is unable to be estimated due to insufficient Doppler signal. Left atrium is moderately dilated. Mild aortic valve thickening. No aortic stenosis. Trace aortic regurgitation. Dilated sinuses of Valsalva with a diameter of 4.4 cm and dilated ascending aorta with a diameter of 4.2 cm. No evidence of pericardial effusion. Compared to the study from 08/24/16, the left ventricular hypertrophy is now severe.An Echo 2D Complete w Doppler and CFI if Ind Image Enhancement 3D and or bubbles was obtained on October 30, 2019. Normal left ventricular size, systolic function and wall motion. Moderate concentric left ventricular hypertrophy.  LVEF calculated by biplane Simpson's was 57%.  Abnormal tissue Doppler suggestive of abnormal diastolic function. Normal right ventricular cavity size and systolic function.  Right ventricular systolic pressure is unable to be estimated due to insufficient Doppler signal.Atria are normal in size. No significant valvular abnormalities. All visible segments of the aorta are normal in size. IVC diameter < 2.1 cm that collapses > 50% with a sniff suggests normal RAP (0-5 mmHg, mean 3 mmHg). No evidence of pericardial effusion. Compared with the prior study, dated 11/10/2018, the measurements of the left ventricular wall thickness, the left atrium, and the ascending aorta were previously abnormal. Technical differences in image acquisition may account for at least some of this apparent change.24 hour Holter monitor from December 11, 2019 revealed sinus rhythm with an average heart rate of 71, 501 PVCs, 100 APCs, brief SVT with no nonsustained ventricular tachycardia.An EKG was obtained on August 27, 2020. This revealed Sinus rhythm rate of 73, intraventricular conduction delay/incomplete left bundle branch block, LVH with strain pattern, T-wave inversions in 1, aVL as well as leads V4 through V6. Similar to previous EKGs.An Echo 2D Complete w Doppler and CFI if Ind Image Enhancement 3D and or bubbles was obtained on September 19, 2020.  Normal left ventricular cavity size.  Moderate concentric left  ventricular hypertrophy.  Hyperdynamic left ventricular systolic function.  LVEF calculated by biplane Simpson's was 75%.  Moderate diastolic dysfunction consistent with increase in filling pressure (grade 2). Normal right ventricular cavity size and systolic function.  Estimated right ventricular systolic pressure is 34 mmHg. Visually the left atrium appears dilated.  No interatrial shunt by color Doppler.  Right atrium is normal in size.Aortic valve is trileaflet.  Mild aortic valve thickening.  No aortic stenosis.  Trace aortic regurgitation. Thickened mitral valve.  Mild mitral regurgitation.  No mitral stenosis.Dilated sinuses of Valsalva with a diameter of 4.2 cm and dilated ascending aorta with a diameter of 4.1 cm. IVC diameter > 2.1 cm that collapses > 50% with a sniff or RAP (5-10 mmHg, mean 8 mmHg). Trivial pericardial effusion. Compared to the images August 2021, left ventricular systolic function is stable.  Differences in aortic dimensions may be technical, and are similar to the prior years studies.  The IVC is dilated on the current study.?JVION MIAZGA is presenting on 10/02/2021 for a follow-up cardiac followup visit. He has been doing well from a cardiac perspective since the last visit. A Spanish translator was used during this visit.  He has felt well since our last visit.  He was briefly on Lasix after our visit last summer but has not been on Lasix in some time.  He has not had increasing edema.  He has remained on chlorthalidone 50 milligrams daily.  Is not using his CPAP.  He states that he can not tolerate this.  His renal function has been stable.  No shortness of breath at rest, orthopnea, PND.  No dyspnea on exertion.  Mild knee pain which is attributed to osteoarthritis.  No chest pain, arm pain, neck pain, jaw pain.  No lightheadedness, dizziness, near-syncope, syncope, palpitations.  No melena or bleeding.PAST MEDICAL HISTORY:Past Medical History: Diagnosis Date ? A-V fistula (HC Code)   right arm ? Anemia  ? 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 Overview:  Added automatically from request for surgery [id number] Overview:  Added automatically from request for surgery [id number] ? Anemia in ESRD (end-stage renal disease) (HC Code) 10/24/2014  Added automatically from request for surgery [id number]  Added automatically from request for surgery [id number] Overview:  Added automatically from request for surgery [id number] ? AV fistula infection (HC Code) (HC CODE) (HC Code)  ? Cirrhosis (HC Code)  ? ESRD on hemodialysis (HC Code) (HC CODE) (HC Code)   Mon-Wed-Fri ? GERD (gastroesophageal reflux disease)  ? Hypercholesteremia  ? Hyperparathyroidism (HC Code)  ? Hypertension  ? Infected prosthetic vascular graft, initial encounter (HC Code) (HC CODE) (HC Code) 10/24/2014 ? Influenza A  ? Morbid obesity (HC Code) 02/03/2018 ? Obstructive sleep apnea   pt uses CPAP ? Secondary hyperparathyroidism of renal origin (HC Code)  ? Thoracic aortic aneurysm (HC Code)  Past Surgical History: Procedure Laterality Date ? AV FISTULA PLACEMENT Left   left lower arm unsuccessful, then moved to upper arm 2011, then had problems in 10/2013 - infection Social History Tobacco Use Smoking Status Former ? Current packs/day: 0.25 ? Average packs/day: 0.3 packs/day for 25.0 years (6.3 ttl pk-yrs) ? Types: Cigarettes Smokeless Tobacco Never CURRENT MEDICATION LIST:Current Outpatient Medications Medication Sig Dispense Refill ? amLODIPine (NORVASC) 10 mg tablet Take 1 tablet (10 mg total) by mouth every morning. 90 tablet 3 ? atorvastatin (LIPITOR) 40 mg tablet Take 1 tablet (40 mg total)  by mouth daily. 90 tablet 3 ? atovaquone (MEPRON) 750 mg/5 mL suspension Take 10 mLs (1,500 mg total) by mouth daily with dinner. 300 mL 11 ? 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 Please dispense supplies covered by patient's insurance. 100 each 3 ? carvediloL (COREG) 25 mg Immediate Release tablet Take 1 tablet (25 mg total) by mouth 2 (two) times daily with breakfast and dinner. 60 tablet 11 ? chlorthalidone (HYGROTEN) 50 mg tablet Take 1 tablet (50 mg total) by mouth daily. 90 tablet 3 ? dulaglutide 3 mg/0.5 mL PnIj Inject 0.5 mLs (3 mg total) under the skin every 7 days. 6 mL 3 ? 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 Please dispense supplies covered by patient's insurance. 100 each 3 ? magnesium oxide (MAG-OX) 400 mg (241.3 mg magnesium) tablet Take 1 tablet (400 mg total) by mouth 2 (two) times daily. 60 tablet 3 ? mycophenolate mofetil (CELLCEPT) 250 mg capsule Take 3 capsules (750 mg total) by mouth 2 (two) times daily. 240 capsule 11 ? omeprazole (PRILOSEC) 20 mg capsule Take 1 capsule (20 mg total) by mouth daily. 90 capsule 3 ? predniSONE (DELTASONE) 5 mg tablet Take 1 tablet (5 mg total) by mouth daily. 30 tablet 11 ? tacrolimus XR (ENVARSUS XR) 1 mg 24 hr tablet Take 1 tablet (1 mg total) by mouth daily. Take with 2 x 4 mg tablets for a total daily dose of 9mg . 90 tablet 11 ? tacrolimus XR (ENVARSUS XR) 4 mg 24 hr tablet Take 2 tablets (8 mg total) by mouth daily. Take with 1 mg tabs for total dose 9 mg daily 60 tablet 11 ? diclofenac (VOLTAREN) 1 % gel Apply topically 4 (four) times daily. (Patient not taking: Reported on 10/02/2021) 100 g 2 ? ergocalciferol (VITAMIN D2) 1,250 mcg (50,000 unit) capsule Take 1 capsule (50,000 Units total) by mouth once a week. (Patient not taking: Reported on 12/02/2020) 12 capsule 0 ? lidocaine maalox diphenhydramine nystatin (MAGIC MOUTHWASH + NYSTATIN) 1:1:1:1 Swish and swallow 10 mLs 4 (four) times daily. (Patient not taking: Reported on 12/02/2020) 500 mL 0 ? urea (CARMOL) 20 % cream Apply topically as needed. (Patient not taking: Reported on 10/02/2021) 85 g 3 No current facility-administered medications for this visit. CURRENT ALLERGY LIST:No Known AllergiesREVIEW OF SYSTEMS:Remaining 12-point review of systems unremarkable with the exception of history of present illness. This was repeated by me today, October 02, 2021.	VITALS:BP 102/64 (Site: l a, Position: Sitting, Cuff Size: Large)  - Pulse 74  - Ht 5' 8.11 (1.73 m)  - Wt 117.8 kg  - SpO2 98%  - BMI 39.34 kg/m?  By MD, blood pressure is 138/62 while seated in the left armPHYSICAL EXAM: There has been no significant change in his physical exam from his prior visit on Visit date not found.	CONSTITUTIONAL:             GENERAL APPEARANCE:  Patient is male.  Alert, well developed, well nourished.  In no acute distress.     HEAD/FACE: The head is normocephalic.     NECK AND THYROID: No bruits noted in the neck.  Difficult to assess JVD in the context of very thick neck.  No thyroid enlargement.     RESPIRATORY:  No wheezes, rales, or rhonchi. Clear to auscultation.     CARDIOVASCULAR:              PALPATION & AUSCULTATION: S1 S2.  Regular rate  with An audible S4 as well as a holosystolic murmur at the cardiac apex.             ARTERIAL PULSES: Normal carotid pulses with no bruits.  No palpable enlargement or bruit of the abdominal aorta.  No renal bruit.  Normal femoral pulses without bruits or enlargements.  Normal radial pulses.  Normal pedal pulses and capillary refill.             EDEMA/VARISCOSITIES OF EXTREMITIES: trace edema.     GASTROINTESTINAL:              ABDOMEN: Soft, non-tender, non distend. Normal bowel signs. No masses appreciated.             LIVER/KIDNEY/SPLEEN: No hepatosplenomegaly.     PSYCHIATRIC: Awake and alert.  Oriented to person, place, time and general circumstances.  Mood and affect appropriate.TEST RESULTS:  Chemistry    Component Value Date/Time  NA 142 08/31/2021 0932  K 3.0 (L) 08/31/2021 0932  CL 99 08/31/2021 0932  CO2 28 08/31/2021 0932  BUN 19 08/31/2021 0932  CREATININE 2.20 (H) 08/31/2021 0932  GLU 107 (H) 08/31/2021 0932  PROT 6.9 11/18/2019 0741    Component Value Date/Time  CALCIUM 8.5 (L) 08/31/2021 0932  ALKPHOS 65 11/18/2019 0741  AST 15 11/18/2019 0741  AST 15 01/23/2013 1534  ALT 16 11/18/2019 0741  ALT 16 01/23/2013 1534  BILITOT 0.6 11/18/2019 0741  ALBUMIN 3.9 11/18/2019 0741  Lab Results Component Value Date  WBC 6.0 08/31/2021  WBC 6.5 08/17/2021  WBC 8.0 08/07/2021  HGB 14.4 08/31/2021  HGB 14.9 08/17/2021  HGB 14.8 08/07/2021  HCT 44.60 08/31/2021  HCT 46.50 08/17/2021  HCT 44.40 08/07/2021  MCV 95.3 08/31/2021  MCV 94.7 08/17/2021  MCV 91.0 08/07/2021  PLT 217 08/31/2021  PLT 210 08/17/2021  PLT 226 08/07/2021  HGBA1C 6.4 (H) 01/19/2021  HGBA1C 6.6 (H) 12/19/2020  HGBA1C 7.3 (H) 08/05/2020  CHOL 137 08/05/2020  CHOL 104 10/03/2018  CHOL 145 09/29/2018  CHOLHDL 3.8 08/05/2020  CHOLHDL 4.3 10/03/2018  CHOLHDL 4.4 09/29/2018  HDL 36 (L) 08/05/2020  HDL 24 (L) 10/03/2018  HDL 33 (L) 09/29/2018  LDL 59 08/05/2020  LDL 43 16/12/9602  LDL 76 54/11/8117  TRIG 209 (H) 08/05/2020  TRIG 185 (H) 10/03/2018  TRIG 182 (H) 09/29/2018  ALT 16 11/18/2019  ALT 20 11/17/2019  ALT 18 11/05/2019  AST 15 11/18/2019  AST 17 11/17/2019  AST 17 11/05/2019 Lab Results Component Value Date  CREATININE 2.20 (H) 08/31/2021 Lab Results Component Value Date  ALT 16 11/18/2019  AST 15 11/18/2019  ALKPHOS 65 11/18/2019  BILITOT 0.6 11/18/2019 Blood work obtained on August 31, 2021 revealed hematocrit 44.60, hemoglobin 14.4, creatinine 2.20, BUN 19, glucose 107, potassium 3, and sodium 142.  Last LDL cholesterol was 59 in May of 2022.  Previous abdominal ultrasound from March of 2020 with no abdominal aortic aneurysms.  [place] scan from September 09, 2020 with maximal aortic dimensions of 4.1 centimeters.  Stable when compared to echocardiography from July of 2022.An EKG was obtained in the office today.  Sinus rhythm at a rate of 74, intraventricular conduction delay, left ventricular hypertrophy with strain pattern.  T-wave inversions in leads V5 and V6.  Similar to previous EKGs.ASSESSMENT/PLAN:Hypertension: His blood pressure is acceptable today in office.  He has been at goal at his other appointments.  He will continue to monitor BP. He denies any lightheadedness, dizziness, or headaches. His current antihypertensive regimen includes carvedilol 25 mg b.i.d., amlodipine  10 mg daily, and chlorthalidone 50 milligrams daily.   he has not had significant edema or significant dyspnea.  I will obtain a follow-up echocardiogram.  I will discuss the possibility of furosemide as needed in the future for increased edema.  I would also consider the use of Jardiance or Farxiga if acceptable to the renal team.  He does have significant risk factors for heart failure with preserved ejection fraction including obesity, obstructive sleep apnea, hypertension.  ?Mildly dilated aortic root: Dilated sinuses of Valsalva with a diameter of 4.1 cm and dilated ascending aorta with a diameter of 4.0 cm on echocardiogram from June of 2018. Most recent [place] Chest completed on 05/25/2018 showed unchanged dilated ascending aorta to 4.1 cm. Severe three-vessel coronary arterial calcification. Indeterminant left renal nodular densities may represent hemorrhagic/proteinaceous cyst given. Recommend correlation with today's ultrasound. Further characterization can be performed with [place] or MRI renal mass protocol if warranted. 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 Dedicated thymic MRI can be performed for further characterization as clinically indicated. US Abdominal Aorta completed on 05/25/2018 showed the proximal abdominal aorta measures 2.6 x 2.4 cm in size. The mid abdominal aorta measures 2.2 x 1.9 cm in size. No evidence for abdominal aortic aneurysm. I have recommended ongoing blood pressure control.  I will discuss further increases in his antihypertensive regimen with his nephrology team.  I will defer further follow-up of his mediastinal nodule to his primary care team.  Most recent echocardiography from November 10, 2018 revealed maximal aortic dimensions of 4.4 centimeters at the sinuses of Valsalva, 4.2 centimeters at the mid ascending aorta.  This represents an increase from his previous study in 2018. At that time the sinuses of Valsalva measured 4.1 centimeters in the ascending aorta measured 4 centimeters.  Most recent echocardiography from July of 2022 as outlined above.   I will obtain a follow-up echocardiogram before his next follow-up in 6 months.  I would continue to advocate aggressive blood pressure control. ?Dyslipidemia: Most recent lipid profile is at goal. He is tolerating Lipitor 80 mg daily.?I will plan to see him in follow up in 6 months. He will contact me with any changes or questions. An EKG was ordered during this visit. Scribed for Josetta Huddle, MD by Riley Kill, medical scribe October 02, 2021  The documentation recorded by the scribe accurately reflects the services I personally performed and the decisions made by me. I reviewed and confirmed all material entered and/or pre-charted by the scribe.

## 2021-10-03 DIAGNOSIS — I10 Essential (primary) hypertension: Secondary | ICD-10-CM

## 2021-10-06 MED FILL — MAGNESIUM OXIDE 400 MG (241.3 MG MAGNESIUM) TABLET: 400 mg (241.3 mg magnesium) | ORAL | 30 days supply | Qty: 60 | Fill #1 | Status: CP

## 2021-10-06 MED FILL — TACROLIMUS XR 4 MG TABLET,EXTENDED RELEASE 24 HR: 4 mg | ORAL | 30 days supply | Qty: 60 | Fill #2 | Status: CP

## 2021-10-06 MED FILL — CARVEDILOL IMMEDIATE RELEASE 25 MG TABLET: 25 mg | ORAL | 30 days supply | Qty: 60 | Fill #11 | Status: CP

## 2021-10-06 MED FILL — PREDNISONE 5 MG TABLET: 5 mg | ORAL | 30 days supply | Qty: 30 | Fill #4 | Status: CP

## 2021-10-10 ENCOUNTER — Encounter: Admit: 2021-10-10 | Payer: PRIVATE HEALTH INSURANCE | Attending: Nephrology | Primary: Family Medicine

## 2021-10-10 DIAGNOSIS — Z79899 Other long term (current) drug therapy: Secondary | ICD-10-CM

## 2021-10-12 MED ORDER — MYCOPHENOLATE MOFETIL 250 MG CAPSULE
250 mg | ORAL_CAPSULE | Freq: Two times a day (BID) | ORAL | 12 refills | 40.00 days | Status: AC
Start: 2021-10-12 — End: ?
  Filled 2021-10-12: qty 240, 40d supply, fill #0

## 2021-10-12 MED FILL — OMEPRAZOLE 20 MG CAPSULE,DELAYED RELEASE: 20 mg | ORAL | 90 days supply | Qty: 90 | Fill #1 | Status: CP

## 2021-10-27 ENCOUNTER — Encounter: Admit: 2021-10-27 | Payer: MEDICAID | Attending: Neurology | Primary: Family Medicine

## 2021-10-28 ENCOUNTER — Encounter: Admit: 2021-10-28 | Payer: PRIVATE HEALTH INSURANCE | Attending: Neurology | Primary: Family Medicine

## 2021-10-28 ENCOUNTER — Encounter: Admit: 2021-10-28 | Payer: PRIVATE HEALTH INSURANCE | Attending: Nephrology | Primary: Family Medicine

## 2021-10-28 DIAGNOSIS — Z2989 Need for prophylactic immunotherapy: Secondary | ICD-10-CM

## 2021-10-28 DIAGNOSIS — Z940 Kidney transplant status: Secondary | ICD-10-CM

## 2021-10-28 DIAGNOSIS — I10 Essential (primary) hypertension: Secondary | ICD-10-CM

## 2021-10-28 MED ORDER — TADALAFIL 10 MG TABLET
10 mg | ORAL_TABLET | 4 refills | 20.00 days | Status: AC
Start: 2021-10-28 — End: 2022-01-25

## 2021-10-29 MED ORDER — CARVEDILOL IMMEDIATE RELEASE 25 MG TABLET
25 mg | ORAL_TABLET | Freq: Two times a day (BID) | ORAL | 12 refills | 30.00 days | Status: AC
Start: 2021-10-29 — End: ?
  Filled 2021-10-30: qty 60, 30d supply, fill #0

## 2021-10-29 MED ORDER — AMLODIPINE 10 MG TABLET
10 mg | ORAL_TABLET | Freq: Every morning | ORAL | 4 refills | 90.00 days | Status: AC
Start: 2021-10-29 — End: ?
  Filled 2021-10-30: qty 90, 90d supply, fill #0

## 2021-10-29 MED FILL — ATOVAQUONE 750 MG/5 ML ORAL SUSPENSION: 750 mg/5 mL | ORAL | 30 days supply | Qty: 300 | Fill #4 | Status: CP

## 2021-10-29 MED FILL — TACROLIMUS XR 1 MG TABLET,EXTENDED RELEASE 24 HR: 1 mg | ORAL | 90 days supply | Qty: 90 | Fill #4 | Status: CP

## 2021-10-29 MED FILL — ATORVASTATIN 40 MG TABLET: 40 mg | ORAL | 90 days supply | Qty: 90 | Fill #2 | Status: CP

## 2021-10-29 MED FILL — TACROLIMUS XR 4 MG TABLET,EXTENDED RELEASE 24 HR: 4 mg | ORAL | 30 days supply | Qty: 60 | Fill #3 | Status: CP

## 2021-10-29 MED FILL — PREDNISONE 5 MG TABLET: 5 mg | ORAL | 30 days supply | Qty: 30 | Fill #5 | Status: CP

## 2021-11-13 ENCOUNTER — Inpatient Hospital Stay: Admit: 2021-11-13 | Discharge: 2021-11-13 | Payer: PRIVATE HEALTH INSURANCE | Primary: Internal Medicine

## 2021-11-13 DIAGNOSIS — Z7960 Long term (current) use of unspecified immunomodulators and immunosuppressants: Secondary | ICD-10-CM

## 2021-11-13 DIAGNOSIS — E119 Type 2 diabetes mellitus without complications: Secondary | ICD-10-CM

## 2021-11-13 DIAGNOSIS — E291 Testicular hypofunction: Secondary | ICD-10-CM

## 2021-11-13 DIAGNOSIS — Z940 Kidney transplant status: Secondary | ICD-10-CM

## 2021-11-13 LAB — CBC WITH AUTO DIFFERENTIAL
BKR WAM ABSOLUTE IMMATURE GRANULOCYTES.: 0.02 x 1000/??L (ref 0.00–0.30)
BKR WAM ANALYZER ANC: 5.39 x 1000/??L (ref 2.00–7.60)
BKR WAM BASOPHIL ABSOLUTE COUNT.: 0.03 x 1000/??L (ref 0.00–1.00)
BKR WAM BASOPHILS: 0.4 % (ref 0.0–1.4)
BKR WAM EOSINOPHIL ABSOLUTE COUNT.: 0.11 x 1000/??L (ref 0.00–1.00)
BKR WAM EOSINOPHILS: 1.5 % (ref 0.0–5.0)
BKR WAM HEMATOCRIT (2 DEC): 43.7 % (ref 38.50–50.00)
BKR WAM HEMOGLOBIN: 14.5 g/dL (ref 13.2–17.1)
BKR WAM IMMATURE GRANULOCYTES: 0.3 % (ref 0.0–1.0)
BKR WAM LYMPHOCYTES: 14.6 % — ABNORMAL LOW (ref 17.0–50.0)
BKR WAM MCHC: 33.2 g/dL (ref 31.0–36.0)
BKR WAM MCV: 91.6 fL (ref 80.0–100.0)
BKR WAM MONOCYTE ABSOLUTE COUNT.: 0.73 x 1000/??L (ref 0.00–1.00)
BKR WAM MONOCYTES: 9.9 % (ref 4.0–12.0)
BKR WAM MPV: 10 fL (ref 8.0–12.0)
BKR WAM NEUTROPHILS: 73.3 % — ABNORMAL HIGH (ref 39.0–72.0)
BKR WAM NUCLEATED RED BLOOD CELLS: 0 % (ref 0.0–1.0)
BKR WAM PLATELETS: 227 x1000/??L (ref 150–420)
BKR WAM RDW-CV: 13.3 % (ref 11.0–15.0)
BKR WAM RED BLOOD CELL COUNT.: 4.77 M/??L (ref 4.00–6.00)
BKR WAM WHITE BLOOD CELL COUNT: 7.4 x1000/??L — ABNORMAL LOW (ref 4.0–11.0)

## 2021-11-13 LAB — BASIC METABOLIC PANEL
BKR ANION GAP: 15 g/dL (ref 7–17)
BKR BLOOD UREA NITROGEN: 19 mg/dL (ref 6–20)
BKR BUN / CREAT RATIO: 9 (ref 8.0–23.0)
BKR CALCIUM: 8.6 mg/dL — ABNORMAL LOW (ref 8.8–10.2)
BKR CHLORIDE: 97 mmol/L — ABNORMAL LOW (ref 98–107)
BKR CO2: 29 mmol/L (ref 20–30)
BKR CREATININE: 2.1 mg/dL — ABNORMAL HIGH (ref 0.40–1.30)
BKR EGFR, CREATININE (CKD-EPI 2021): 36 mL/min/1.73m2 — ABNORMAL LOW (ref >=60–420)
BKR GLUCOSE: 117 mg/dL — ABNORMAL HIGH (ref 70–100)
BKR POTASSIUM: 3.1 mmol/L — ABNORMAL LOW (ref 3.3–5.3)
BKR SODIUM: 141 mmol/L (ref 136–144)
BKR WAM MCH (PG): 2.1 mg/dL — ABNORMAL HIGH (ref 0.40–1.30)

## 2021-11-13 LAB — MAGNESIUM: BKR MAGNESIUM: 1.5 mg/dL — ABNORMAL LOW (ref 1.7–2.4)

## 2021-11-13 LAB — PHOSPHORUS     (BH GH L LMW YH): BKR PHOSPHORUS: 3.5 mg/dL (ref 2.2–4.5)

## 2021-11-13 LAB — TACROLIMUS LEVEL     (BH GH L LMW YH): BKR TACROLIMUS BLOOD: 6.4 ng/mL

## 2021-11-13 LAB — TESTOSTERONE, TOTAL     (BH GH L LMW YH): BKR TESTOSTERONE TOTAL: 237 ng/dL

## 2021-11-14 LAB — HEMOGLOBIN A1C
BKR ESTIMATED AVERAGE GLUCOSE: 131 mg/dL
BKR HEMOGLOBIN A1C: 6.2 % — ABNORMAL HIGH (ref 4.0–5.6)

## 2021-11-14 LAB — ALBUMIN/CREATININE PANEL, URINE, RANDOM
BKR ALBUMIN, URINE, RANDOM: 456.4 mg/L
BKR ALBUMIN/CREATININE RATIO, URINE, RANDOM: 257.6 mg/g{creat} — ABNORMAL HIGH (ref <30.0)
BKR CREATININE, URINE, RANDOM: 177 mg/dL

## 2021-11-14 LAB — PROTEIN, TOTAL W/CREATININE, URINE, RANDOM     (BH GH LMW YH)
BKR CREATININE, URINE, RANDOM: 177 mg/dL
BKR PROTEIN URINE RANDOM: 0.65 g/L
BKR PROTEIN/CREATININE RATIO, URINE, RANDOM: 0.37 mg/mg Cr — ABNORMAL HIGH (ref <30.0)

## 2021-11-16 LAB — BK VIRUS BY PCR, QUANTITATIVE, PLASMA: BKR BK VIRUS, QUANTITATIVE PCR, PLASMA: NOT DETECTED

## 2021-11-24 ENCOUNTER — Encounter: Admit: 2021-11-24 | Payer: PRIVATE HEALTH INSURANCE | Attending: Nephrology | Primary: Family Medicine

## 2021-11-24 ENCOUNTER — Encounter
Admit: 2021-11-24 | Payer: PRIVATE HEALTH INSURANCE | Attending: Student in an Organized Health Care Education/Training Program | Primary: Family Medicine

## 2021-11-24 MED ORDER — TACROLIMUS XR 1 MG TABLET,EXTENDED RELEASE 24 HR
1 mg | ORAL_TABLET | Freq: Every day | ORAL | 12 refills | 90.00 days | Status: AC
Start: 2021-11-24 — End: ?
  Filled 2022-01-25: qty 90, 90d supply, fill #0

## 2021-12-01 ENCOUNTER — Encounter: Admit: 2021-12-01 | Payer: BLUE CROSS/BLUE SHIELD | Attending: Ophthalmology | Primary: Family Medicine

## 2021-12-01 MED FILL — MAGNESIUM OXIDE 400 MG (241.3 MG MAGNESIUM) TABLET: 400 mg (241.3 mg magnesium) | ORAL | 30 days supply | Qty: 60 | Fill #2 | Status: CP

## 2021-12-01 MED FILL — MYCOPHENOLATE MOFETIL 250 MG CAPSULE: 250 mg | ORAL | 40 days supply | Qty: 240 | Fill #1 | Status: CP

## 2021-12-02 MED FILL — TACROLIMUS XR 4 MG TABLET,EXTENDED RELEASE 24 HR: 4 mg | ORAL | 30 days supply | Qty: 60 | Fill #4 | Status: CP

## 2021-12-02 MED FILL — CARVEDILOL IMMEDIATE RELEASE 25 MG TABLET: 25 mg | ORAL | 30 days supply | Qty: 60 | Fill #1 | Status: CP

## 2021-12-02 MED FILL — ATOVAQUONE 750 MG/5 ML ORAL SUSPENSION: 750 mg/5 mL | ORAL | 30 days supply | Qty: 300 | Fill #5 | Status: CP

## 2021-12-03 ENCOUNTER — Encounter: Admit: 2021-12-03 | Payer: BLUE CROSS/BLUE SHIELD | Primary: Family Medicine

## 2021-12-04 ENCOUNTER — Encounter: Admit: 2021-12-04 | Payer: BLUE CROSS/BLUE SHIELD | Primary: Family Medicine

## 2021-12-08 ENCOUNTER — Encounter: Admit: 2021-12-08 | Payer: BLUE CROSS/BLUE SHIELD | Attending: Ophthalmology | Primary: Family Medicine

## 2021-12-10 ENCOUNTER — Encounter: Admit: 2021-12-10 | Payer: BLUE CROSS/BLUE SHIELD | Attending: Ophthalmology | Primary: Family Medicine

## 2021-12-15 ENCOUNTER — Encounter: Admit: 2021-12-15 | Payer: BLUE CROSS/BLUE SHIELD | Attending: Ophthalmology | Primary: Family Medicine

## 2021-12-29 ENCOUNTER — Encounter: Admit: 2021-12-29 | Payer: BLUE CROSS/BLUE SHIELD | Attending: Neurology | Primary: Family Medicine

## 2021-12-30 ENCOUNTER — Encounter: Admit: 2021-12-30 | Payer: PRIVATE HEALTH INSURANCE | Attending: Neurology | Primary: Family Medicine

## 2022-01-01 ENCOUNTER — Encounter: Admit: 2022-01-01 | Payer: PRIVATE HEALTH INSURANCE | Attending: Nephrology | Primary: Family Medicine

## 2022-01-01 DIAGNOSIS — I10 Essential (primary) hypertension: Secondary | ICD-10-CM

## 2022-01-01 MED ORDER — CHLORTHALIDONE 50 MG TABLET
50 mg | ORAL_TABLET | Freq: Every day | ORAL | 4 refills | 90.00 days | Status: AC
Start: 2022-01-01 — End: ?
  Filled 2022-01-04: qty 90, 90d supply, fill #1
  Filled 2022-01-04: qty 90, 90d supply, fill #0

## 2022-01-01 MED FILL — DULAGLUTIDE 3 MG/0.5 ML SUBCUTANEOUS PEN INJECTOR: 3 mg/0.5 mL | SUBCUTANEOUS | 84 days supply | Qty: 6 | Fill #1 | Status: CP

## 2022-01-01 MED FILL — ATOVAQUONE 750 MG/5 ML ORAL SUSPENSION: 750 mg/5 mL | ORAL | 30 days supply | Qty: 300 | Fill #6 | Status: CP

## 2022-01-01 MED FILL — CARVEDILOL IMMEDIATE RELEASE 25 MG TABLET: 25 mg | ORAL | 30 days supply | Qty: 60 | Fill #2 | Status: CP

## 2022-01-01 MED FILL — TACROLIMUS XR 4 MG TABLET,EXTENDED RELEASE 24 HR: 4 mg | ORAL | 30 days supply | Qty: 60 | Fill #5 | Status: CP

## 2022-01-01 MED FILL — MYCOPHENOLATE MOFETIL 250 MG CAPSULE: 250 mg | ORAL | 40 days supply | Qty: 240 | Fill #2 | Status: CP

## 2022-01-04 MED FILL — PREDNISONE 5 MG TABLET: 5 mg | ORAL | 30 days supply | Qty: 30 | Fill #6 | Status: CP

## 2022-01-22 MED FILL — MAGNESIUM OXIDE 400 MG (241.3 MG MAGNESIUM) TABLET: 400 mg (241.3 mg magnesium) | ORAL | 30 days supply | Qty: 60 | Fill #3 | Status: CP

## 2022-01-22 MED FILL — OMEPRAZOLE 20 MG CAPSULE,DELAYED RELEASE: 20 mg | ORAL | 90 days supply | Qty: 90 | Fill #2 | Status: CP

## 2022-01-22 MED FILL — MAGNESIUM OXIDE 400 MG (241.3 MG MAGNESIUM) TABLET: 400 mg (241.3 mg magnesium) | ORAL | 30 days supply | Qty: 60 | Fill #4 | Status: CP

## 2022-01-25 MED ORDER — TADALAFIL 20 MG TABLET
20 mg | ORAL_TABLET | 4 refills | 20.00 days | Status: AC
Start: 2022-01-25 — End: ?

## 2022-01-25 MED FILL — TACROLIMUS XR 4 MG TABLET,EXTENDED RELEASE 24 HR: 4 mg | ORAL | 30 days supply | Qty: 60 | Fill #6 | Status: CP

## 2022-01-25 MED FILL — PREDNISONE 5 MG TABLET: 5 mg | ORAL | 30 days supply | Qty: 30 | Fill #7 | Status: CP

## 2022-01-25 MED FILL — CARVEDILOL IMMEDIATE RELEASE 25 MG TABLET: 25 mg | ORAL | 30 days supply | Qty: 60 | Fill #3 | Status: CP

## 2022-01-25 MED FILL — ATOVAQUONE 750 MG/5 ML ORAL SUSPENSION: 750 mg/5 mL | ORAL | 30 days supply | Qty: 300 | Fill #7 | Status: CP

## 2022-02-06 MED FILL — MYCOPHENOLATE MOFETIL 250 MG CAPSULE: 250 mg | ORAL | 40 days supply | Qty: 240 | Fill #3 | Status: CP

## 2022-02-06 MED FILL — ATORVASTATIN 40 MG TABLET: 40 mg | ORAL | 90 days supply | Qty: 90 | Fill #3 | Status: CP

## 2022-02-06 MED FILL — AMLODIPINE 10 MG TABLET: 10 mg | ORAL | 90 days supply | Qty: 90 | Fill #1 | Status: CP

## 2022-02-18 MED FILL — CARVEDILOL IMMEDIATE RELEASE 25 MG TABLET: 25 mg | ORAL | 30 days supply | Qty: 60 | Fill #4 | Status: CP

## 2022-02-18 MED FILL — TACROLIMUS XR 4 MG TABLET,EXTENDED RELEASE 24 HR: 4 mg | ORAL | 30 days supply | Qty: 60 | Fill #7 | Status: CP

## 2022-02-18 MED FILL — ATOVAQUONE 750 MG/5 ML ORAL SUSPENSION: 750 mg/5 mL | ORAL | 30 days supply | Qty: 300 | Fill #8 | Status: CP

## 2022-02-18 MED FILL — PREDNISONE 5 MG TABLET: 5 mg | ORAL | 30 days supply | Qty: 30 | Fill #8 | Status: CP

## 2022-02-18 MED FILL — ATOVAQUONE 750 MG/5 ML ORAL SUSPENSION: 750 mg/5 mL | ORAL | 30 days supply | Qty: 300 | Fill #9 | Status: CP

## 2022-02-24 ENCOUNTER — Ambulatory Visit: Admit: 2022-02-24 | Payer: PRIVATE HEALTH INSURANCE | Primary: Family Medicine

## 2022-02-24 ENCOUNTER — Inpatient Hospital Stay: Admit: 2022-02-24 | Discharge: 2022-02-24 | Payer: PRIVATE HEALTH INSURANCE | Primary: Internal Medicine

## 2022-02-24 ENCOUNTER — Encounter: Admit: 2022-02-24 | Payer: BLUE CROSS/BLUE SHIELD | Primary: Family Medicine

## 2022-02-24 DIAGNOSIS — E291 Testicular hypofunction: Secondary | ICD-10-CM

## 2022-02-24 DIAGNOSIS — H25819 Combined forms of age-related cataract, unspecified eye: Secondary | ICD-10-CM

## 2022-02-24 DIAGNOSIS — E119 Type 2 diabetes mellitus without complications: Secondary | ICD-10-CM

## 2022-02-24 DIAGNOSIS — Z7960 Long term (current) use of unspecified immunomodulators and immunosuppressants: Secondary | ICD-10-CM

## 2022-02-24 DIAGNOSIS — Z940 Kidney transplant status: Secondary | ICD-10-CM

## 2022-02-24 LAB — BASIC METABOLIC PANEL
BKR ANION GAP: 13 (ref 7–17)
BKR BLOOD UREA NITROGEN: 15 mg/dL (ref 6–20)
BKR BUN / CREAT RATIO: 8.3 (ref 8.0–23.0)
BKR CALCIUM: 8.4 mg/dL — ABNORMAL LOW (ref 8.8–10.2)
BKR CHLORIDE: 96 mmol/L — ABNORMAL LOW (ref 98–107)
BKR CO2: 31 mmol/L — ABNORMAL HIGH (ref 20–30)
BKR CREATININE: 1.8 mg/dL — ABNORMAL HIGH (ref 0.40–1.30)
BKR EGFR, CREATININE (CKD-EPI 2021): 43 mL/min/{1.73_m2} — ABNORMAL LOW (ref >=60)
BKR GLUCOSE: 98 mg/dL (ref 70–100)
BKR POTASSIUM: 2.9 mmol/L — ABNORMAL LOW (ref 3.3–5.3)
BKR SODIUM: 140 mmol/L (ref 136–144)
BKR WAM HEMOGLOBIN: 13 g/dL (ref 7–17)

## 2022-02-24 LAB — PROTEIN, TOTAL W/CREATININE, URINE, RANDOM     (BH GH LMW YH)
BKR CREATININE, URINE, RANDOM: 88 mg/dL
BKR PROTEIN URINE RANDOM: 0.42 g/L
BKR PROTEIN/CREATININE RATIO, URINE, RANDOM: 0.47 mg/mg Cr — ABNORMAL HIGH (ref <30.0)

## 2022-02-24 LAB — CBC WITH AUTO DIFFERENTIAL
BKR WAM ABSOLUTE IMMATURE GRANULOCYTES.: 0.03 x 1000/??L (ref 0.00–0.30)
BKR WAM ABSOLUTE LYMPHOCYTE COUNT.: 0.96 x 1000/??L (ref 0.60–3.70)
BKR WAM ABSOLUTE NRBC (2 DEC): 0 x 1000/??L (ref 0.00–1.00)
BKR WAM ANALYZER ANC: 4.01 x 1000/??L (ref 2.00–7.60)
BKR WAM BASOPHIL ABSOLUTE COUNT.: 0.03 x 1000/??L (ref 0.00–1.00)
BKR WAM BASOPHILS: 0.5 % (ref 0.0–1.4)
BKR WAM EOSINOPHIL ABSOLUTE COUNT.: 0.07 x 1000/??L (ref 0.00–1.00)
BKR WAM EOSINOPHILS: 1.3 % (ref 0.0–5.0)
BKR WAM HEMATOCRIT (2 DEC): 42.4 % (ref 38.50–50.00)
BKR WAM IMMATURE GRANULOCYTES: 0.5 % (ref 0.0–1.0)
BKR WAM LYMPHOCYTES: 17.2 % (ref 17.0–50.0)
BKR WAM MCH (PG): 31.7 pg (ref 27.0–33.0)
BKR WAM MCHC: 33.5 g/dL — ABNORMAL LOW (ref 31.0–36.0)
BKR WAM MCV: 94.6 fL (ref 80.0–100.0)
BKR WAM MONOCYTE ABSOLUTE COUNT.: 0.48 x 1000/??L (ref 0.00–1.00)
BKR WAM MONOCYTES: 8.6 % (ref 4.0–12.0)
BKR WAM MPV: 10.4 fL (ref 8.0–12.0)
BKR WAM NEUTROPHILS: 71.9 % (ref 39.0–72.0)
BKR WAM NUCLEATED RED BLOOD CELLS: 0 % (ref 0.0–1.0)
BKR WAM PLATELETS: 178 x1000/??L — ABNORMAL LOW (ref 150–420)
BKR WAM RDW-CV: 13.5 % (ref 11.0–15.0)
BKR WAM RED BLOOD CELL COUNT.: 4.48 M/ÂµL (ref 4.00–6.00)
BKR WAM WHITE BLOOD CELL COUNT: 5.6 x1000/??L — ABNORMAL LOW (ref 4.0–11.0)

## 2022-02-24 LAB — ALBUMIN/CREATININE PANEL, URINE, RANDOM
BKR ALBUMIN, URINE, RANDOM: 288.6 mg/L (ref 0.00–0.30)
BKR ALBUMIN/CREATININE RATIO, URINE, RANDOM: 327.6 mg/g{creat} — ABNORMAL HIGH (ref <30.0)
BKR CREATININE, URINE, RANDOM: 88 mg/dL
BKR WAM ABSOLUTE NRBC (2 DEC): 88 mg/dL (ref 0.00–1.00)

## 2022-02-24 LAB — PHOSPHORUS     (BH GH L LMW YH): BKR PHOSPHORUS: 3.9 mg/dL (ref 2.2–4.5)

## 2022-02-24 LAB — TESTOSTERONE, TOTAL     (BH GH L LMW YH): BKR TESTOSTERONE TOTAL: 281 ng/dL

## 2022-02-24 LAB — MAGNESIUM: BKR MAGNESIUM: 1.6 mg/dL — ABNORMAL LOW (ref 1.7–2.4)

## 2022-02-25 LAB — TACROLIMUS LEVEL     (BH GH L LMW YH)
BKR TACROLIMUS BLOOD: 7.2 ng/mL
BKR WAM ABSOLUTE LYMPHOCYTE COUNT.: 7.2 ng/mL (ref 0.60–3.70)

## 2022-02-25 LAB — BK VIRUS BY PCR, QUANTITATIVE, PLASMA: BKR BK VIRUS, QUANTITATIVE PCR, PLASMA: NOT DETECTED x 1000/??L (ref 0.00–1.00)

## 2022-02-26 ENCOUNTER — Encounter
Admit: 2022-02-26 | Payer: PRIVATE HEALTH INSURANCE | Attending: Student in an Organized Health Care Education/Training Program | Primary: Family Medicine

## 2022-02-26 ENCOUNTER — Encounter: Admit: 2022-02-26 | Payer: PRIVATE HEALTH INSURANCE | Primary: Family Medicine

## 2022-02-26 ENCOUNTER — Ambulatory Visit
Admit: 2022-02-26 | Payer: BLUE CROSS/BLUE SHIELD | Attending: Student in an Organized Health Care Education/Training Program | Primary: Family Medicine

## 2022-02-26 DIAGNOSIS — N186 End stage renal disease: Secondary | ICD-10-CM

## 2022-02-26 DIAGNOSIS — D649 Anemia, unspecified: Secondary | ICD-10-CM

## 2022-02-26 DIAGNOSIS — I10 Essential (primary) hypertension: Secondary | ICD-10-CM

## 2022-02-26 DIAGNOSIS — I770 Arteriovenous fistula, acquired: Secondary | ICD-10-CM

## 2022-02-26 DIAGNOSIS — E213 Hyperparathyroidism, unspecified: Secondary | ICD-10-CM

## 2022-02-26 DIAGNOSIS — K7460 Unspecified cirrhosis of liver: Secondary | ICD-10-CM

## 2022-02-26 DIAGNOSIS — Z940 Kidney transplant status: Secondary | ICD-10-CM

## 2022-02-26 DIAGNOSIS — N2581 Secondary hyperparathyroidism of renal origin: Secondary | ICD-10-CM

## 2022-02-26 DIAGNOSIS — T827XXA Infection and inflammatory reaction due to other cardiac and vascular devices, implants and grafts, initial encounter: Secondary | ICD-10-CM

## 2022-02-26 DIAGNOSIS — E7800 Pure hypercholesterolemia, unspecified: Secondary | ICD-10-CM

## 2022-02-26 DIAGNOSIS — K219 Gastro-esophageal reflux disease without esophagitis: Secondary | ICD-10-CM

## 2022-02-26 DIAGNOSIS — G4733 Obstructive sleep apnea (adult) (pediatric): Secondary | ICD-10-CM

## 2022-02-26 DIAGNOSIS — J101 Influenza due to other identified influenza virus with other respiratory manifestations: Secondary | ICD-10-CM

## 2022-02-26 DIAGNOSIS — I712 Thoracic aortic aneurysm (HC Code): Secondary | ICD-10-CM

## 2022-02-26 MED ORDER — LOSARTAN 25 MG TABLET
25 mg | ORAL_TABLET | Freq: Every day | ORAL | 7 refills | 30.00 days | Status: AC
Start: 2022-02-26 — End: ?
  Filled 2022-02-26: qty 30, 30d supply, fill #0
  Filled 2022-02-26: qty 4, 4d supply, fill #1

## 2022-02-26 MED ORDER — MAGNESIUM OXIDE 400 MG (241.3 MG MAGNESIUM) TABLET
400241.3 mg (241.3 mg magnesium) | ORAL_TABLET | Freq: Three times a day (TID) | ORAL | 4 refills | 30.00 days | Status: AC
Start: 2022-02-26 — End: ?
  Filled 2022-02-26: qty 90, 30d supply, fill #0

## 2022-03-01 NOTE — Progress Notes
[facility] Kidney/Pancreas Transplant Program Medical follow up Visit Chief complaint: Adam Harper returns for follow up ZO:XWRUEA transplant function assessmentImmuno drug therapy requiring intensive monitoring for efficacy and toxicityBlood pressure managementImportant Transplant History: ?	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	CMV D + / R + ?	Donor Toxo positive ?	CPRA zero, Alemtuzumab ?	9/4-9/7: admit for hyperK+, urgent HD. Bactrim switched to Atovaquone?	Intermittent pre-syncopal events, holter monitoring.?	Stent removal 11/28/2019?	3/22: Biopsy: ACUTE CELLULAR REJECTION, BANFF TYPE IA ?	10/22: Biopsy: ACUTE TUBULAR INJURY, FOCALLast updated : 12/13/19 , Stanford Scotland, MD H/o congenital macroglossia History of Present Illness:Adam Harper comes to the transplant clinic today for a routine clinic visit. Overall patient is doing very well and denies any specific complains. He is doing well.  Denies any complaints.PFSH reviewed and confirmed 02/26/2022 Review of SystemsIn addition, following review of systems was conducted : Patient denies any fevers or chills, no nausea or vomiting. No chest pain/ chest tightness, no cough/sputum or shortness of breath. No headaches, neck stiffness, no vision changes, no ear or nose discharge. No oral lesions. No constipation or diarrhea. No urinary complains like dysuria, hematuria, cloudy urine, reduced output. No skin rashes, no joint effusions. No significant weight gain or weight loss.Physical Exam  Vitals: BP (!) 142/67 (Site: l a, Position: Sitting, Cuff Size: Large)  - Pulse 79  - Temp 97.5 ?F (36.4 ?C) (Temporal)  - Resp (!) 22  - Ht 5' 8.11 (1.73 m)  - Wt 122.2 kg  - SpO2 97%  - BMI 40.83 kg/m? Wt Readings from Last 4 Encounters: 02/26/22 122.2 kg 10/02/21 117.8 kg 09/01/21 116.4 kg 08/07/21 120.8 kg GENERAL : Appears wellHEENT : EOMI , mucous membranes moist RESPIRATORY : NVBS heard bilaterallyCARDIOVASCULAR : S1 S2 + , no added sounds heard. ABDOMEN : Soft, non-tenderEXTREMITIES : No edemaLabs:Lab Results Component Value Date  CREATININE 1.80 (H) 02/24/2022  BUN 15 02/24/2022  NA 140 02/24/2022  K 2.9 (L) 02/24/2022  CL 96 (L) 02/24/2022  CO2 31 (H) 02/24/2022  Lab Results Component Value Date  WBC 5.6 02/24/2022  HGB 14.2 02/24/2022  HCT 42.40 02/24/2022  MCV 94.6 02/24/2022  PLT 178 02/24/2022 Lab Results Component Value Date  TACROLIMUS 7.2 02/24/2022 Most recent BK viral testing -- BK Virus Quantitative PCR (no units) Date Value 02/24/2022 Not Detected Most recent CMV viral testing -- Cytomegalovirus Quantitative PCR (no units) Date Value 01/19/2021 Not Detected Immunosuppression     Start End  mycophenolate mofetil (CELLCEPT) 250 mg capsule 10/12/2021 --  Sig - Route: Take 3 capsules (750 mg total) by mouth 2 (two) times daily. - Oral  Notes to Pharmacy: Kidney Transplant Z94.0.  Prior authorization: Approved  predniSONE (DELTASONE) 5 mg tablet 06/18/2021 --  Sig - Route: Take 1 tablet (5 mg total) by mouth daily. - Oral  Notes to Pharmacy: Kidney Transplant Z94.0  tacrolimus XR (ENVARSUS XR) 1 mg 24 hr tablet 11/24/2021 --  Sig - Route: Take 1 tablet (1 mg total) by mouth daily. Take with 2 x 4 mg tablets for a total daily dose of 9mg . - Oral  Notes to Pharmacy: z94.0  No prior authorization was found for this prescription.  Found prior authorization for another prescription for the same medication: Approved  tacrolimus XR (ENVARSUS XR) 4 mg 24 hr tablet 08/13/2021 --  Sig - Route: Take 2 tablets (8 mg total) by mouth daily. Take with 1 mg tabs for total dose 9 mg daily - Oral  Prior authorization: Approved  Antihypertensives  Start End  amLODIPine (NORVASC) 10 mg tablet 10/29/2021 10/29/2022  Sig - Route: Take 1 tablet (10 mg total) by mouth every morning. - Oral  carvediloL (COREG) 25 mg Immediate Release tablet 10/29/2021 10/29/2022  Sig - Route: Take 1 tablet (25 mg total) by mouth 2 (two) times daily with breakfast and dinner. - Oral  chlorthalidone (HYGROTEN) 50 mg tablet 01/01/2022 01/01/2023  Sig - Route: Take 1 tablet (50 mg total) by mouth daily. - Oral  Transplant Prophylaxis     Disp Refills Start End  atovaquone (MEPRON) 750 mg/5 mL suspension 300 mL 11 07/01/2021 06/26/2022  Sig - Route: Take 10 mLs (1,500 mg total) by mouth daily with dinner. - Oral  Prior authorization: Approved  lidocaine maalox diphenhydramine nystatin (MAGIC MOUTHWASH + NYSTATIN) 1:1:1:1 500 mL 0 06/05/2020 --  Sig - Route: Swish and swallow 10 mLs 4 (four) times daily. - Swish & Swallow  Viral monitoring : Adam Harper is Lab Results Component Value Date  CYTOMEGALOVI Positive (A) 10/02/2019 Assessment / PlanAllograft function: DDKT 11/05/19, h/o ACTR Creatinine at 1.8 at baseline  UPC 0.47 Persistent hypokalemia with metabolic alkaloids Hypomagnesemia Immunosuppression: envarsus 9mg /day, MMF 750mg  bid, prednisone 5mg /day tacrolimus drug level is 7.2 .  No changes made..Hypertension:currently on amlodipine 10mg /day, coreg 25mg  bid and chlorthalidone 50mg /dayWill hold Chlorthalidone due to persistent hypokalemia / metabolic alkalosis and hypomagnesemia Start losartan 25mg  daily and will titrate up based on kidney function  , proteinuria and  side ffects patient  told to contact us if he feel his tongue swells up Infection Prophylaxis: Will stop mepronRTC in six months Labs in 2 weeks after changes made today Faye Ramsay  MD, Transplant nephrologist

## 2022-03-01 NOTE — Progress Notes
Nursing Assessment Follow Up Visit: Kidney Transplant Program Subjective: Adam Harper (61 y.o. 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 Denies fever, chills, chest pain, shortness of breath, or abdominal pain. No hematuria or dysuria noted by Adam Harper. Urinating without symptoms of obstruction or infection. Reports compliance with current medications.Env 9 Pred 750 BIDCurrent Transplant Immunosuppression:Immunosuppression     Start End  mycophenolate mofetil (CELLCEPT) 250 mg capsule 10/12/2021 --  Sig - Route: Take 3 capsules (750 mg total) by mouth 2 (two) times daily. - Oral  Notes to Pharmacy: Kidney Transplant Z94.0.  Prior authorization: Approved  predniSONE (DELTASONE) 5 mg tablet 06/18/2021 --  Sig - Route: Take 1 tablet (5 mg total) by mouth daily. - Oral  Notes to Pharmacy: Kidney Transplant Z94.0  tacrolimus XR (ENVARSUS XR) 1 mg 24 hr tablet 11/24/2021 --  Sig - Route: Take 1 tablet (1 mg total) by mouth daily. Take with 2 x 4 mg tablets for a total daily dose of 9mg . - Oral  Notes to Pharmacy: z94.0  No prior authorization was found for this prescription.  Found prior authorization for another prescription for the same medication: Approved  tacrolimus XR (ENVARSUS XR) 4 mg 24 hr tablet 08/13/2021 --  Sig - Route: Take 2 tablets (8 mg total) by mouth daily. Take with 1 mg tabs for total dose 9 mg daily - Oral  Prior authorization: Approved  Transplant Prophylaxis     Disp Refills Start End  atovaquone (MEPRON) 750 mg/5 mL suspension 300 mL 11 07/01/2021 06/26/2022  Sig - Route: Take 10 mLs (1,500 mg total) by mouth daily with dinner. - Oral  Prior authorization: Approved  lidocaine maalox diphenhydramine nystatin (MAGIC MOUTHWASH + NYSTATIN) 1:1:1:1 500 mL 0 06/05/2020 --  Sig - Route: Swish and swallow 10 mLs 4 (four) times daily. - Swish & Swallow  No Known AllergiesObjective: Vital Signs: BP (!) 142/67 (Site: l a, Position: Sitting, Cuff Size: Large)  - Pulse 79  - Temp 97.5 ?F (36.4 ?C) (Temporal)  - Resp (!) 22  - Ht 5' 8.11 (1.73 m)  - Wt 122.2 kg  - SpO2 97%  - BMI 40.83 kg/m?  Most recent creatinine: Lab Results Component Value Date  CREATININE 1.80 (H) 02/24/2022 Lab Results Component Value Date  TACROLIMUS 7.2 02/24/2022 Env level is a 24 hour trough.Transplant graft is functioning. Assessment: Adam Harper is a 61 y.o. male here today for follow-up of kidney transplant. Continues with immunosuppression regimen and is at risk for infection.The patient has not had any hospitalizations since their last visit. Physical Capacity: No LimitationsKarnofsky Status: 100% - Normal, No Complaints, No Evidence of DiseaseEmployment Status: No; DisabilityThe patient has not developed diabetes since their last visit. The patient has not had dialysis since their last follow-up. The patient has not had organ transplant rejection since their last follow-up. The patient not had any evidence of malignancy since their last follow-up. Plan: Review labs with clinic attending. Adam Harper will be notified of any immunosuppression changes/treatments based on lab results.Adam Harper was provided education on maintaining medication compliance and post-transplant care. Patient was advised to contact the transplant center with any concerns. The patient verbalized understanding and intent to comply with the plan of care. Electronically Signed by Tilda Franco, RN, February 26, 2022

## 2022-03-02 ENCOUNTER — Encounter: Admit: 2022-03-02 | Payer: BLUE CROSS/BLUE SHIELD | Attending: Ophthalmology | Primary: Family Medicine

## 2022-03-02 ENCOUNTER — Encounter: Admit: 2022-03-02 | Payer: PRIVATE HEALTH INSURANCE | Attending: Ophthalmology | Primary: Family Medicine

## 2022-03-11 MED FILL — MYCOPHENOLATE MOFETIL 250 MG CAPSULE: 250 mg | ORAL | 40 days supply | Qty: 240 | Fill #4 | Status: CP

## 2022-03-16 MED FILL — TACROLIMUS XR 4 MG TABLET,EXTENDED RELEASE 24 HR: 4 mg | ORAL | 30 days supply | Qty: 60 | Fill #8 | Status: CP

## 2022-04-05 ENCOUNTER — Inpatient Hospital Stay: Admit: 2022-04-05 | Discharge: 2022-04-05 | Payer: PRIVATE HEALTH INSURANCE | Primary: Internal Medicine

## 2022-04-05 DIAGNOSIS — I10 Essential (primary) hypertension: Secondary | ICD-10-CM

## 2022-04-05 DIAGNOSIS — N1831 Chronic kidney disease, stage 3a: Secondary | ICD-10-CM

## 2022-04-05 DIAGNOSIS — I129 Hypertensive chronic kidney disease with stage 1 through stage 4 chronic kidney disease, or unspecified chronic kidney disease: Secondary | ICD-10-CM

## 2022-04-05 NOTE — Other
Covering for Dr. Dell Ponto - please let Adam Harper know that his echo is stable, in fact even shows some improvement in diastolic function from his prior study one year ago. Great news

## 2022-04-06 ENCOUNTER — Telehealth: Admit: 2022-04-06 | Payer: PRIVATE HEALTH INSURANCE | Attending: Cardiovascular Disease | Primary: Family Medicine

## 2022-04-06 NOTE — Telephone Encounter
Spoke with patient.

## 2022-04-06 NOTE — Telephone Encounter
-----   Message from Sallye Lat, APRN sent at 04/05/2022  4:37 PM EST -----Covering for Dr. PossDell Ponto- please let [name] know that his echo is stable, in fact even shows some improvement in diastolic function from his prior study one year ago. Great news

## 2022-04-10 ENCOUNTER — Encounter: Admit: 2022-04-10 | Payer: PRIVATE HEALTH INSURANCE | Attending: Internal Medicine | Primary: Family Medicine

## 2022-04-10 DIAGNOSIS — N186 End stage renal disease: Secondary | ICD-10-CM

## 2022-04-12 MED ORDER — ATORVASTATIN 40 MG TABLET
40 mg | ORAL_TABLET | Freq: Every day | ORAL | 4 refills | 90.00 days | Status: AC
Start: 2022-04-12 — End: ?
  Filled 2022-04-15: qty 90, 90d supply, fill #0

## 2022-04-15 MED FILL — LOSARTAN 25 MG TABLET: 25 mg | ORAL | 30 days supply | Qty: 30 | Fill #3 | Status: CP

## 2022-04-15 MED FILL — TACROLIMUS XR 1 MG TABLET,EXTENDED RELEASE 24 HR: 1 mg | ORAL | 90 days supply | Qty: 90 | Fill #2 | Status: CP

## 2022-04-15 MED FILL — MAGNESIUM OXIDE 400 MG (241.3 MG MAGNESIUM) TABLET: 400 mg (241.3 mg magnesium) | ORAL | 30 days supply | Qty: 90 | Fill #1 | Status: CP

## 2022-04-15 MED FILL — MYCOPHENOLATE MOFETIL 250 MG CAPSULE: 250 mg | ORAL | 40 days supply | Qty: 240 | Fill #5 | Status: CP

## 2022-04-15 MED FILL — DULAGLUTIDE 3 MG/0.5 ML SUBCUTANEOUS PEN INJECTOR: 3 mg/0.5 mL | SUBCUTANEOUS | 84 days supply | Qty: 6 | Fill #3 | Status: CP

## 2022-04-15 MED FILL — CARVEDILOL IMMEDIATE RELEASE 25 MG TABLET: 25 mg | ORAL | 30 days supply | Qty: 60 | Fill #5 | Status: CP

## 2022-04-15 MED FILL — OMEPRAZOLE 20 MG CAPSULE,DELAYED RELEASE: 20 mg | ORAL | 90 days supply | Qty: 90 | Fill #3 | Status: CP

## 2022-04-15 MED FILL — TACROLIMUS XR 1 MG TABLET,EXTENDED RELEASE 24 HR: 1 mg | ORAL | 90 days supply | Qty: 90 | Fill #1 | Status: CP

## 2022-04-15 MED FILL — TACROLIMUS XR 4 MG TABLET,EXTENDED RELEASE 24 HR: 4 mg | ORAL | 30 days supply | Qty: 60 | Fill #9 | Status: CP

## 2022-04-15 MED FILL — PREDNISONE 5 MG TABLET: 5 mg | ORAL | 30 days supply | Qty: 30 | Fill #9 | Status: CP

## 2022-04-15 MED FILL — AMLODIPINE 10 MG TABLET: 10 mg | ORAL | 90 days supply | Qty: 90 | Fill #2 | Status: CP

## 2022-04-15 MED FILL — LOSARTAN 25 MG TABLET: 25 mg | ORAL | 30 days supply | Qty: 30 | Fill #2 | Status: CP

## 2022-04-15 MED FILL — MAGNESIUM OXIDE 400 MG (241.3 MG MAGNESIUM) TABLET: 400 mg (241.3 mg magnesium) | ORAL | 30 days supply | Qty: 90 | Fill #2 | Status: CP

## 2022-04-15 MED FILL — OMEPRAZOLE 20 MG CAPSULE,DELAYED RELEASE: 20 mg | ORAL | 90 days supply | Qty: 90 | Fill #4 | Status: CP

## 2022-04-26 ENCOUNTER — Inpatient Hospital Stay: Admit: 2022-04-26 | Discharge: 2022-04-26 | Payer: BLUE CROSS/BLUE SHIELD | Primary: Internal Medicine

## 2022-04-26 DIAGNOSIS — Z940 Kidney transplant status: Secondary | ICD-10-CM

## 2022-04-26 DIAGNOSIS — Z796 Long-term use of immunosuppressant medication: Secondary | ICD-10-CM

## 2022-04-26 DIAGNOSIS — E291 Testicular hypofunction: Secondary | ICD-10-CM

## 2022-04-26 LAB — CBC WITH AUTO DIFFERENTIAL
BKR INFLUENZA A: 0.03 x 1000/??L — AB (ref 0.00–1.00)
BKR SARS-COV-2 RNA (COVID-19) (YH): 0.38 x 1000/??L (ref 0.00–1.00)
BKR WAM ABSOLUTE IMMATURE GRANULOCYTES.: 0.02 x 1000/??L (ref 0.00–0.30)
BKR WAM ABSOLUTE LYMPHOCYTE COUNT.: 0.89 x 1000/??L (ref 0.60–3.70)
BKR WAM ABSOLUTE NRBC (2 DEC): 0 x 1000/??L (ref 0.00–1.00)
BKR WAM ANALYZER ANC: 3.81 x 1000/??L (ref 2.00–7.60)
BKR WAM BASOPHIL ABSOLUTE COUNT.: 0.03 x 1000/ÂµL (ref 0.00–1.00)
BKR WAM BASOPHILS: 0.6 % (ref 0.0–1.4)
BKR WAM EOSINOPHIL ABSOLUTE COUNT.: 0.09 x 1000/??L (ref 0.00–1.00)
BKR WAM EOSINOPHILS: 1.7 % (ref 0.0–5.0)
BKR WAM HEMATOCRIT (2 DEC): 41.7 % — ABNORMAL HIGH (ref 38.50–50.00)
BKR WAM HEMOGLOBIN: 13.5 g/dL (ref 13.2–17.1)
BKR WAM IMMATURE GRANULOCYTES: 0.4 % (ref 0.0–1.0)
BKR WAM LYMPHOCYTES: 17 % (ref 17.0–50.0)
BKR WAM MCH (PG): 31.3 pg — ABNORMAL HIGH (ref 27.0–33.0)
BKR WAM MCHC: 32.4 g/dL — ABNORMAL LOW (ref 31.0–36.0)
BKR WAM MCV: 96.8 fL (ref 80.0–100.0)
BKR WAM MONOCYTE ABSOLUTE COUNT.: 0.38 x 1000/ÂµL (ref 0.00–1.00)
BKR WAM MONOCYTES: 7.3 % (ref 4.0–12.0)
BKR WAM MPV: 10.1 fL (ref 8.0–12.0)
BKR WAM NEUTROPHILS: 73 % — ABNORMAL HIGH (ref 39.0–72.0)
BKR WAM NUCLEATED RED BLOOD CELLS: 0 % (ref 0.0–1.0)
BKR WAM PLATELETS: 188 x1000/??L — ABNORMAL LOW (ref 150–420)
BKR WAM RDW-CV: 13.2 % — ABNORMAL LOW (ref 11.0–15.0)
BKR WAM RED BLOOD CELL COUNT.: 4.31 M/ÂµL (ref 4.00–6.00)
BKR WAM WHITE BLOOD CELL COUNT: 5.2 x1000/ÂµL (ref 4.0–11.0)

## 2022-04-26 LAB — ALBUMIN/CREATININE PANEL, URINE, RANDOM
BKR ALBUMIN, URINE, RANDOM: 591 mg/L
BKR ALBUMIN/CREATININE RATIO, URINE, RANDOM: 317.2 mg/g Cr — ABNORMAL HIGH (ref <30.0)
BKR CREATININE, URINE, RANDOM: 186 mg/dL

## 2022-04-26 LAB — BASIC METABOLIC PANEL
BKR ANION GAP: 12 g/dL (ref 7–17)
BKR BLOOD UREA NITROGEN: 11 mg/dL (ref 6–20)
BKR BUN / CREAT RATIO: 6.1 — ABNORMAL LOW (ref 8.0–23.0)
BKR CALCIUM: 8.3 mg/dL — ABNORMAL LOW (ref 8.8–10.2)
BKR CHLORIDE: 100 mmol/L (ref 98–107)
BKR CO2: 29 mmol/L (ref 20–30)
BKR CREATININE: 1.8 mg/dL — ABNORMAL HIGH (ref 0.40–1.30)
BKR EGFR, CREATININE (CKD-EPI 2021): 43 mL/min/{1.73_m2} — ABNORMAL LOW (ref >=60)
BKR GLUCOSE: 116 mg/dL — ABNORMAL HIGH (ref 70–100)
BKR POTASSIUM: 3.4 mmol/L (ref 3.3–5.3)
BKR SODIUM: 141 mmol/L (ref 136–144)

## 2022-04-26 LAB — MAGNESIUM: BKR MAGNESIUM: 1.7 mg/dL (ref 1.7–2.4)

## 2022-04-26 LAB — PHOSPHORUS     (BH GH L LMW YH): BKR PHOSPHORUS: 3.5 mg/dL (ref 2.2–4.5)

## 2022-04-26 LAB — TESTOSTERONE, TOTAL     (BH GH L LMW YH): BKR TESTOSTERONE TOTAL: 220 ng/dL

## 2022-04-27 LAB — BK VIRUS BY PCR, QUANTITATIVE, PLASMA: BKR BK VIRUS, QUANTITATIVE PCR, PLASMA: NOT DETECTED

## 2022-04-27 LAB — TACROLIMUS LEVEL     (BH GH L LMW YH): BKR TACROLIMUS BLOOD: 5.3 ng/mL (ref 0.00–1.00)

## 2022-05-10 ENCOUNTER — Encounter: Admit: 2022-05-10 | Payer: PRIVATE HEALTH INSURANCE | Attending: Internal Medicine | Primary: Family Medicine

## 2022-05-10 DIAGNOSIS — Z940 Kidney transplant status: Secondary | ICD-10-CM

## 2022-05-10 DIAGNOSIS — Z796 Long-term use of immunosuppressant medication: Secondary | ICD-10-CM

## 2022-05-10 DIAGNOSIS — R12 Heartburn: Secondary | ICD-10-CM

## 2022-05-11 MED ORDER — OMEPRAZOLE 20 MG CAPSULE,DELAYED RELEASE
20 mg | ORAL_CAPSULE | Freq: Every day | ORAL | 4 refills | 90.00 days | Status: AC
Start: 2022-05-11 — End: ?
  Filled 2022-06-23: qty 90, 90d supply, fill #0

## 2022-05-12 MED FILL — PREDNISONE 5 MG TABLET: 5 mg | ORAL | 30 days supply | Qty: 30 | Fill #10 | Status: CP

## 2022-05-17 MED FILL — MYCOPHENOLATE MOFETIL 250 MG CAPSULE: 250 mg | ORAL | 40 days supply | Qty: 240 | Fill #6 | Status: CP

## 2022-05-17 MED FILL — TACROLIMUS XR 4 MG TABLET,EXTENDED RELEASE 24 HR: 4 mg | ORAL | 30 days supply | Qty: 60 | Fill #10 | Status: CP

## 2022-05-27 ENCOUNTER — Inpatient Hospital Stay: Admit: 2022-05-27 | Discharge: 2022-05-27 | Payer: BLUE CROSS/BLUE SHIELD | Primary: Internal Medicine

## 2022-05-27 DIAGNOSIS — Z940 Kidney transplant status: Secondary | ICD-10-CM

## 2022-05-27 DIAGNOSIS — Z7960 Long term (current) use of unspecified immunomodulators and immunosuppressants: Secondary | ICD-10-CM

## 2022-05-27 LAB — CBC WITH AUTO DIFFERENTIAL
BKR BUN / CREAT RATIO: 13.1 % — ABNORMAL LOW (ref 11.0–15.0)
BKR EGFR, CREATININE (CKD-EPI 2021): 209 x1000/??L — ABNORMAL LOW (ref 150–420)
BKR INR: 0 % — ABNORMAL HIGH (ref 0.0–1.0)
BKR WAM ABSOLUTE IMMATURE GRANULOCYTES.: 0.02 x 1000/??L (ref 0.00–0.30)
BKR WAM ABSOLUTE LYMPHOCYTE COUNT.: 0.99 x 1000/??L (ref 0.60–3.70)
BKR WAM ABSOLUTE NRBC (2 DEC): 0 x 1000/??L (ref 0.00–1.00)
BKR WAM ANALYZER ANC: 4.3 x 1000/ÂµL (ref 2.00–7.60)
BKR WAM BASOPHIL ABSOLUTE COUNT.: 0.03 x 1000/ÂµL (ref 0.00–1.00)
BKR WAM BASOPHILS: 0.5 % (ref 0.0–1.4)
BKR WAM EOSINOPHIL ABSOLUTE COUNT.: 0.13 x 1000/??L (ref 0.00–1.00)
BKR WAM EOSINOPHILS: 2.2 % (ref 0.0–5.0)
BKR WAM HEMATOCRIT (2 DEC): 44.9 % — ABNORMAL HIGH (ref 38.50–50.00)
BKR WAM HEMOGLOBIN: 14.6 g/dL (ref 13.2–17.1)
BKR WAM IMMATURE GRANULOCYTES: 0.3 % (ref 0.0–1.0)
BKR WAM LYMPHOCYTES: 16.7 % — ABNORMAL LOW (ref 17.0–50.0)
BKR WAM MCH (PG): 31.2 pg — ABNORMAL HIGH (ref 27.0–33.0)
BKR WAM MCV: 95.9 fL (ref 80.0–100.0)
BKR WAM MONOCYTE ABSOLUTE COUNT.: 0.45 x 1000/??L (ref 0.00–1.00)
BKR WAM MONOCYTES: 7.6 % — ABNORMAL HIGH (ref 4.0–12.0)
BKR WAM MPV: 9.8 fL (ref 8.0–12.0)
BKR WAM NEUTROPHILS: 72.7 % — ABNORMAL HIGH (ref 39.0–72.0)
BKR WAM NUCLEATED RED BLOOD CELLS: 0 % (ref 0.0–1.0)
BKR WAM PLATELETS: 209 x1000/ÂµL (ref 150–420)
BKR WAM RDW-CV: 13.1 % (ref 11.0–15.0)
BKR WAM WHITE BLOOD CELL COUNT: 5.9 x1000/??L (ref 4.0–11.0)

## 2022-05-27 LAB — BASIC METABOLIC PANEL
BKR ANION GAP: 16 g/dL (ref 7–17)
BKR BLOOD UREA NITROGEN: 13 mg/dL (ref 6–20)
BKR CALCIUM: 8.4 mg/dL — ABNORMAL LOW (ref 8.8–10.2)
BKR CHLORIDE: 99 mmol/L (ref 98–107)
BKR CO2: 26 mmol/L (ref 20–30)
BKR CREATININE: 1.7 mg/dL — ABNORMAL HIGH (ref 0.40–1.30)
BKR GLUCOSE: 112 mg/dL — ABNORMAL HIGH (ref 70–100)
BKR POTASSIUM: 3.6 mmol/L (ref 3.3–5.3)
BKR SODIUM: 141 mmol/L (ref 136–144)
BKR WAM MCHC: 8.4 mg/dL — ABNORMAL LOW (ref 8.8–10.2)
BKR WAM RED BLOOD CELL COUNT.: 26 mmol/L (ref 20–30)

## 2022-05-27 LAB — MAGNESIUM: BKR MAGNESIUM: 1.5 mg/dL — ABNORMAL LOW (ref 1.7–2.4)

## 2022-05-27 LAB — PHOSPHORUS     (BH GH L LMW YH): BKR PHOSPHORUS: 4.3 mg/dL (ref 2.2–4.5)

## 2022-05-28 ENCOUNTER — Ambulatory Visit: Admit: 2022-05-28 | Payer: BLUE CROSS/BLUE SHIELD | Primary: Family Medicine

## 2022-05-28 DIAGNOSIS — Z79899 Other long term (current) drug therapy: Secondary | ICD-10-CM

## 2022-05-28 DIAGNOSIS — R0981 Nasal congestion: Secondary | ICD-10-CM

## 2022-05-28 DIAGNOSIS — N189 Chronic kidney disease, unspecified: Secondary | ICD-10-CM

## 2022-05-28 DIAGNOSIS — Z940 Kidney transplant status: Secondary | ICD-10-CM

## 2022-05-28 LAB — TACROLIMUS LEVEL     (BH GH L LMW YH): BKR TACROLIMUS BLOOD: 4.3 ng/mL — ABNORMAL LOW

## 2022-05-28 LAB — BK VIRUS BY PCR, QUANTITATIVE, PLASMA: BKR BK VIRUS, QUANTITATIVE PCR, PLASMA: NOT DETECTED

## 2022-05-30 NOTE — Progress Notes
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 Bactrim switched to AtovaquoneIntermittent pre-syncopal events, holter monitoring.Stent removal 9/15/20213/22: Biopsy: ACUTE CELLULAR REJECTION, BANFF TYPE IA 10/22: Biopsy: ACUTE TUBULAR INJURY, FOCALLast updated : 12/13/19 , Stanford Scotland, MD H/o congenital macroglossia History of Present Illness:Adam Harper comes to the transplant clinic today for a routine clinic visit. Overall patient is doing very well and denies any specific complains. He is doing well.  Denies any complaints.PFSH reviewed and confirmed 05/28/2022 Review of SystemsIn addition, following review of systems was conducted : Patient denies any fevers or chills, no nausea or vomiting. No chest pain/ chest tightness, no cough/sputum or shortness of breath. No headaches, neck stiffness, no vision changes, no ear or nose discharge. No oral lesions. No constipation or diarrhea. No urinary complains like dysuria, hematuria, cloudy urine, reduced output. No skin rashes, no joint effusions. No significant weight gain or weight loss.Physical Exam  Vitals: BP 121/71 (Site: l a, Position: Sitting, Cuff Size: Large)  - Pulse 78  - Temp 97.6 ?F (36.4 ?C) (Temporal)  - Ht 5' 8.11 (1.73 m)  - Wt 123.9 kg  - SpO2 (!) 91%  - BMI 41.40 kg/m? Wt Readings from Last 4 Encounters: 05/28/22 123.9 kg 02/26/22 122.2 kg 10/02/21 117.8 kg 09/01/21 116.4 kg GENERAL : Appears well ; morbidly obese body habitusHEENT : EOMI , mucous membranes moist RESPIRATORY : NVBS heard bilaterallyCARDIOVASCULAR : S1 S2 + , no added sounds heard. ABDOMEN : Soft, non-tenderEXTREMITIES : No edemaLabs:Lab Results Component Value Date  CREATININE 1.70 (H) 05/27/2022  BUN 13 05/27/2022  NA 141 05/27/2022  K 3.6 05/27/2022  CL 99 05/27/2022  CO2 26 05/27/2022  Lab Results Component Value Date  WBC 5.9 05/27/2022  HGB 14.6 05/27/2022  HCT 44.90 05/27/2022  MCV 95.9 05/27/2022  PLT 209 05/27/2022 Lab Results Component Value Date  TACROLIMUS 4.3 (L) 05/27/2022 Most recent BK viral testing -- BK Virus Quantitative PCR (no units) Date Value 05/27/2022 Not Detected Most recent CMV viral testing -- Cytomegalovirus Quantitative PCR (no units) Date Value 01/19/2021 Not Detected Immunosuppression     Start End  mycophenolate mofetil (CELLCEPT) 250 mg capsule 10/12/2021 --  Sig - Route: Take 3 capsules (750 mg total) by mouth 2 (two) times daily. - Oral  Notes to Pharmacy: Kidney Transplant Z94.0.  Prior authorization: Approved  predniSONE (DELTASONE) 5 mg tablet 06/18/2021 --  Sig - Route: Take 1 tablet (5 mg total) by mouth daily. - Oral  Notes to Pharmacy: Kidney Transplant Z94.0  tacrolimus XR (ENVARSUS XR) 1 mg 24 hr tablet 11/24/2021 --  Sig - Route: Take 1 tablet (1 mg total) by mouth daily. Take with 2 x 4 mg tablets for a total daily dose of 9mg . - Oral  Notes to Pharmacy: z94.0  No prior authorization was found for this prescription.  Found prior authorization for another prescription for the same medication: Approved  tacrolimus XR (ENVARSUS XR) 4 mg 24 hr tablet 08/13/2021 --  Sig - Route: Take 2 tablets (8 mg total) by mouth daily. Take with 1 mg tabs for total dose 9 mg daily - Oral  Prior authorization: Approved  Antihypertensives  Start End  amLODIPine (NORVASC) 10 mg tablet 10/29/2021 10/29/2022  Sig - Route: Take 1 tablet (10 mg total) by mouth every morning. - Oral  carvediloL (COREG) 25 mg Immediate Release tablet 10/29/2021 10/29/2022  Sig - Route: Take 1 tablet (25 mg total) by mouth 2 (two) times daily with breakfast and dinner. - Oral  chlorthalidone (HYGROTEN) 50 mg tablet 01/01/2022 01/01/2023  Sig - Route: Take 1 tablet (50 mg total) by mouth daily. - Oral  losartan (COZAAR) 25 mg tablet 02/26/2022 --  Sig - Route: Take 1 tablet (25 mg total) by mouth daily. - Oral  Transplant Prophylaxis     Disp Refills Start End  atovaquone (MEPRON) 750 mg/5 mL suspension 300 mL 11 07/01/2021 06/26/2022  Sig - Route: Take 10 mLs (1,500 mg total) by mouth daily with dinner. - Oral  Prior authorization: Approved  lidocaine maalox diphenhydramine nystatin (MAGIC MOUTHWASH + NYSTATIN) 1:1:1:1 500 mL 0 06/05/2020 --  Sig - Route: Swish and swallow 10 mLs 4 (four) times daily. - Swish & Swallow  Viral monitoring : Adam Harper is Lab Results Component Value Date  CYTOMEGALOVI Positive (A) 10/02/2019 Assessment / PlanKidney transplant recipient. S/p DD KT.  2 years 6 months out of tx.  Overall allograft function is preserved.  Serum creatinine ranges between 1.7-2.1 with hemodynamic fluctuations.  Euvolemic on exam.  Acid-base  status is preserved.  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 DSA last checked in October 22.  Negative.UACR in 200-300s range.  On losartan 25 mg daily.  Plan to up titrate losartan and slowly withdraw amlodipine.ImmunosuppressionPrednisone 5 mg qd ; ENV 9 mg daily ; MMF 750 mg bid FK levels on this visit 4.3, drawn in the afternoon.  Not sure whether this was an accurate trough or not.  Repeat labs within a week's time.  We will await updated tacrolimus levels prior to consideration of any dose adjustment. BKV. Last checked  05/27/2022. Not detected.CMV.  Last checked in November 22. Not detected.Hypertension. amlodipine 10 mg q.d., Carvedilol 25 mg b.i.d.; losartan 25 mg OD.  Suboptimal compliance with CPAP with a history of OSA/OHS.  Advised to follow-up with pulmonology for calibration of facemask and with ENT for persistent sinus congestion which leads to suboptimal adherence to CPAP.  Referral for both provided.Additionally given history of proteinuria, plan for down titration of amlodipine to 5 mg and uptitration of losartan to 50 mg once tacrolimus levels are obtained.Anemia. No concern at this time. HB in stable range corresponding to renal function. No indication for ESA at this time.CKD-MBD. Ca borderline low after withdrawal of chlorthalidone.  Phos stable. PTH in March 22 was 35.2.  Update with next labs. Hypomagnesemia.  On magnesium oxide 400 mg t.i.d. Change dosing to 800 mg b.i.d.OSA/OHS.  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  Patient was advised to contact is if he feels there is tongue swelling.  In that case we will withdraw losartan and switch to another antihypertensive/anti proteinuric agent.Additionally, staff message sent to Dr. Dell Ponto for any possible concern/evaluation needed with regards to amyloidosis, given findings on echo of moderate concentric LVH.Morbid obesity.  Counseled in detail for weight loss.  Additionally ongoing discussion with endocrinology with the change from dulaglutide to semaglutide be of any benefit.His morbidly severe  obesity predisposes him for FSGS given single kidney (allograft).Labs within 1 week after changes discussed today.Adam Harper, MDTransplant Nephrology.

## 2022-05-31 ENCOUNTER — Encounter: Admit: 2022-05-31 | Payer: PRIVATE HEALTH INSURANCE | Primary: Family Medicine

## 2022-06-05 MED FILL — CARVEDILOL IMMEDIATE RELEASE 25 MG TABLET: 25 mg | ORAL | 30 days supply | Qty: 60 | Fill #6 | Status: CP

## 2022-06-05 MED FILL — PREDNISONE 5 MG TABLET: 5 mg | ORAL | 30 days supply | Qty: 30 | Fill #11 | Status: CP

## 2022-06-05 MED FILL — LOSARTAN 25 MG TABLET: 25 mg | ORAL | 30 days supply | Qty: 30 | Fill #4 | Status: CP

## 2022-06-07 ENCOUNTER — Inpatient Hospital Stay: Admit: 2022-06-07 | Discharge: 2022-06-07 | Payer: BLUE CROSS/BLUE SHIELD | Primary: Internal Medicine

## 2022-06-07 DIAGNOSIS — Z940 Kidney transplant status: Secondary | ICD-10-CM

## 2022-06-07 DIAGNOSIS — Z6835 Body mass index (BMI) 35.0-35.9, adult: Secondary | ICD-10-CM

## 2022-06-07 DIAGNOSIS — Z79899 Other long term (current) drug therapy: Secondary | ICD-10-CM

## 2022-06-07 DIAGNOSIS — R0981 Nasal congestion: Secondary | ICD-10-CM

## 2022-06-07 LAB — CBC WITH AUTO DIFFERENTIAL
BKR WAM ABSOLUTE IMMATURE GRANULOCYTES.: 0.01 x 1000/??L (ref 0.00–0.30)
BKR WAM ABSOLUTE LYMPHOCYTE COUNT.: 0.78 x 1000/ÂµL (ref 0.60–3.70)
BKR WAM ABSOLUTE NRBC (2 DEC): 0 x 1000/??L (ref 0.00–1.00)
BKR WAM ANALYZER ANC: 5.24 x 1000/ÂµL (ref 2.00–7.60)
BKR WAM BASOPHIL ABSOLUTE COUNT.: 0.01 x 1000/??L (ref 0.00–1.00)
BKR WAM BASOPHILS: 0.2 % (ref 0.0–1.4)
BKR WAM EOSINOPHIL ABSOLUTE COUNT.: 0.04 x 1000/??L (ref 0.00–1.00)
BKR WAM EOSINOPHILS: 0.6 % — ABNORMAL HIGH (ref 0.0–5.0)
BKR WAM HEMATOCRIT (2 DEC): 41.4 % (ref 38.50–50.00)
BKR WAM HEMOGLOBIN: 14.1 g/dL (ref 13.2–17.1)
BKR WAM IMMATURE GRANULOCYTES: 0.2 % (ref 0.0–1.0)
BKR WAM LYMPHOCYTES: 12 % — ABNORMAL LOW (ref 17.0–50.0)
BKR WAM MCH (PG): 32.1 pg (ref 27.0–33.0)
BKR WAM MCHC: 34.1 g/dL (ref 31.0–36.0)
BKR WAM MCV: 94.3 fL (ref 80.0–100.0)
BKR WAM MONOCYTE ABSOLUTE COUNT.: 0.44 x 1000/??L (ref 0.00–1.00)
BKR WAM MONOCYTES: 6.7 % (ref 4.0–12.0)
BKR WAM MPV: 10.2 fL (ref 8.0–12.0)
BKR WAM NEUTROPHILS: 80.3 % — ABNORMAL HIGH (ref 39.0–72.0)
BKR WAM NUCLEATED RED BLOOD CELLS: 0 % (ref 0.0–1.0)
BKR WAM PLATELETS: 196 x1000/??L (ref 150–420)
BKR WAM RDW-CV: 13 % (ref 11.0–15.0)
BKR WAM RED BLOOD CELL COUNT.: 4.39 M/??L (ref 4.00–6.00)
BKR WAM WHITE BLOOD CELL COUNT: 6.5 x1000/??L (ref 4.0–11.0)

## 2022-06-07 LAB — BASIC METABOLIC PANEL
BKR ANION GAP: 15 (ref 7–17)
BKR BLOOD UREA NITROGEN: 20 mg/dL (ref 6–20)
BKR BUN / CREAT RATIO: 11.1 x 1000/??L (ref 8.0–23.0)
BKR CALCIUM: 8.8 mg/dL (ref 8.8–10.2)
BKR CHLORIDE: 102 mmol/L — ABNORMAL HIGH (ref 98–107)
BKR CO2: 23 mmol/L — ABNORMAL LOW (ref 20–30)
BKR CREATININE: 1.8 mg/dL — ABNORMAL HIGH (ref 0.40–1.30)
BKR EGFR, CREATININE (CKD-EPI 2021): 43 mL/min/{1.73_m2} — ABNORMAL LOW (ref >=60–3.70)
BKR GLUCOSE: 115 mg/dL — ABNORMAL HIGH (ref 70–100)
BKR POTASSIUM: 3.9 mmol/L (ref 3.3–5.3)
BKR SODIUM: 140 mmol/L (ref 136–144)

## 2022-06-07 LAB — PHOSPHORUS     (BH GH L LMW YH): BKR PHOSPHORUS: 3.8 mg/dL (ref 2.2–4.5)

## 2022-06-07 LAB — MAGNESIUM: BKR MAGNESIUM: 1.7 mg/dL (ref 1.7–2.4)

## 2022-06-08 LAB — ALBUMIN/CREATININE PANEL, URINE, RANDOM
BKR ALBUMIN, URINE, RANDOM: 902.4 mg/L
BKR CREATININE, URINE, RANDOM: 415 mg/dL

## 2022-06-08 LAB — PROTEIN, TOTAL W/CREATININE, URINE, RANDOM     (BH GH LMW YH)
BKR ALBUMIN/CREATININE RATIO, URINE, RANDOM: 0.34 mg/mg Cr — ABNORMAL HIGH (ref <30.0)
BKR CREATININE, URINE, RANDOM: 415 mg/dL
BKR PROTEIN URINE RANDOM: 1.43 g/L
BKR PROTEIN/CREATININE RATIO, URINE, RANDOM: 0.34 mg/mg{creat} — ABNORMAL HIGH (ref <0.10)

## 2022-06-08 LAB — TACROLIMUS LEVEL     (BH GH L LMW YH): BKR TACROLIMUS BLOOD: 18.8 ng/mL

## 2022-06-09 ENCOUNTER — Telehealth: Admit: 2022-06-09 | Payer: PRIVATE HEALTH INSURANCE | Primary: Family Medicine

## 2022-06-09 NOTE — Telephone Encounter
Called pt with Spanish interpreter [phone].Pt labs from Monday done at 3 pm. Called pt to discuss. Takes his meds at 11a-12p daily. Educated on how to complete a well timed Env level. Pt verbalized understanding and will plan to repeat labs with well timed level.

## 2022-06-14 ENCOUNTER — Inpatient Hospital Stay: Admit: 2022-06-14 | Discharge: 2022-06-14 | Payer: BLUE CROSS/BLUE SHIELD | Primary: Internal Medicine

## 2022-06-14 DIAGNOSIS — Z940 Kidney transplant status: Secondary | ICD-10-CM

## 2022-06-14 DIAGNOSIS — Z79899 Other long term (current) drug therapy: Secondary | ICD-10-CM

## 2022-06-14 DIAGNOSIS — R0981 Nasal congestion: Secondary | ICD-10-CM

## 2022-06-14 DIAGNOSIS — M899 Disorder of bone, unspecified: Secondary | ICD-10-CM

## 2022-06-14 DIAGNOSIS — N189 Chronic kidney disease, unspecified: Secondary | ICD-10-CM

## 2022-06-14 DIAGNOSIS — Z6835 Body mass index (BMI) 35.0-35.9, adult: Secondary | ICD-10-CM

## 2022-06-14 LAB — BASIC METABOLIC PANEL
BKR ALBUMIN/CREATININE RATIO, URINE, RANDOM: 12 mg/dL — ABNORMAL HIGH (ref <30.0)
BKR BLOOD UREA NITROGEN: 12 mg/dL (ref 6–20)
BKR BUN / CREAT RATIO: 7.1 — ABNORMAL LOW (ref 8.0–23.0)
BKR CALCIUM: 8.6 mg/dL — ABNORMAL LOW (ref 8.8–10.2)
BKR CHLORIDE: 100 mmol/L (ref 98–107)
BKR CO2: 29 mmol/L (ref 20–30)
BKR CREATININE: 1.7 mg/dL — ABNORMAL HIGH (ref 0.40–1.30)
BKR GLUCOSE: 92 mg/dL (ref 70–100)
BKR POTASSIUM: 3.9 mmol/L (ref 3.3–5.3)
BKR SODIUM: 141 mmol/L (ref 136–144)

## 2022-06-14 LAB — CBC WITH AUTO DIFFERENTIAL
BKR ANION GAP: 33.6 g/dL (ref 31.0–36.0)
BKR CREATININE, URINE, RANDOM: 13.1 % (ref 11.0–15.0)
BKR EGFR, CREATININE (CKD-EPI 2021): 8.5 % — ABNORMAL LOW (ref 4.0–12.0)
BKR WAM ABSOLUTE IMMATURE GRANULOCYTES.: 0.02 x 1000/??L (ref 0.00–0.30)
BKR WAM ABSOLUTE LYMPHOCYTE COUNT.: 1 x 1000/??L (ref 0.60–3.70)
BKR WAM ABSOLUTE NRBC (2 DEC): 0 x 1000/ÂµL (ref 0.00–1.00)
BKR WAM ANALYZER ANC: 4.43 x 1000/??L (ref 2.00–7.60)
BKR WAM BASOPHIL ABSOLUTE COUNT.: 0.04 x 1000/ÂµL (ref 0.00–1.00)
BKR WAM BASOPHILS: 0.7 % (ref 0.0–1.4)
BKR WAM EOSINOPHIL ABSOLUTE COUNT.: 0.09 x 1000/??L (ref 0.00–1.00)
BKR WAM EOSINOPHILS: 1.5 % (ref 0.0–5.0)
BKR WAM HEMATOCRIT (2 DEC): 43.4 % (ref 38.50–50.00)
BKR WAM HEMOGLOBIN: 14.6 g/dL (ref 13.2–17.1)
BKR WAM IMMATURE GRANULOCYTES: 0.3 % (ref 0.0–1.0)
BKR WAM LYMPHOCYTES: 16.4 % — ABNORMAL LOW (ref 17.0–50.0)
BKR WAM MCH (PG): 31.7 pg (ref 27.0–33.0)
BKR WAM MCHC: 33.6 g/dL (ref 31.0–36.0)
BKR WAM MCV: 94.1 fL (ref 80.0–100.0)
BKR WAM MONOCYTE ABSOLUTE COUNT.: 0.52 x 1000/??L (ref 0.00–1.00)
BKR WAM MONOCYTES: 8.5 % (ref 4.0–12.0)
BKR WAM MPV: 10 fL (ref 8.0–12.0)
BKR WAM NEUTROPHILS: 72.6 % — ABNORMAL HIGH (ref 39.0–72.0)
BKR WAM NUCLEATED RED BLOOD CELLS: 0 % (ref 0.0–1.0)
BKR WAM PLATELETS: 203 x1000/??L (ref 150–420)
BKR WAM RDW-CV: 13.1 % (ref 11.0–15.0)
BKR WAM RED BLOOD CELL COUNT.: 4.61 M/??L (ref 4.00–6.00)
BKR WAM WHITE BLOOD CELL COUNT: 6.1 x1000/??L (ref 4.0–11.0)

## 2022-06-14 LAB — PROTEIN, TOTAL W/CREATININE, URINE, RANDOM     (BH GH LMW YH)
BKR CREATININE, URINE, RANDOM: 159 mg/dL — ABNORMAL HIGH (ref 39.0–72.0)
BKR PROTEIN URINE RANDOM: 0.68 g/L — ABNORMAL HIGH (ref 0.40–1.30)
BKR PROTEIN/CREATININE RATIO, URINE, RANDOM: 0.43 mg/mg{creat} — ABNORMAL HIGH (ref <0.10)

## 2022-06-14 LAB — PHOSPHORUS     (BH GH L LMW YH): BKR PHOSPHORUS: 3.4 mg/dL (ref 2.2–4.5)

## 2022-06-14 LAB — MAGNESIUM: BKR MAGNESIUM: 1.5 mg/dL — ABNORMAL LOW (ref 1.7–2.4)

## 2022-06-14 LAB — ALBUMIN/CREATININE PANEL, URINE, RANDOM: BKR ALBUMIN, URINE, RANDOM: 450.2 mg/L

## 2022-06-14 LAB — PTH, INTACT WITHOUT CALCIUM: BKR PARATHYROID HORMONE INTACT: 49.8 pg/mL (ref 15.0–65.0)

## 2022-06-15 ENCOUNTER — Telehealth: Admit: 2022-06-15 | Payer: PRIVATE HEALTH INSURANCE | Primary: Family Medicine

## 2022-06-15 LAB — TACROLIMUS LEVEL     (BH GH L LMW YH): BKR TACROLIMUS BLOOD: 11.3 ng/mL

## 2022-06-15 NOTE — Telephone Encounter
Spanish Interpreter ID: [id number] Labs reviewed with Dr. Joanie Coddington. Env level previously stable at this dose. Will have pt repeat labs again within 1 week and if still elevated, will have dose reduced. Called to discuss with pt. Unable to reach. Left detailed VM with callback info in pt's preferred language.

## 2022-06-19 ENCOUNTER — Encounter
Admit: 2022-06-19 | Payer: PRIVATE HEALTH INSURANCE | Attending: Student in an Organized Health Care Education/Training Program | Primary: Family Medicine

## 2022-06-19 ENCOUNTER — Encounter: Admit: 2022-06-19 | Payer: PRIVATE HEALTH INSURANCE | Attending: Nephrology | Primary: Family Medicine

## 2022-06-21 MED ORDER — MAGNESIUM OXIDE 400 MG (241.3 MG MAGNESIUM) TABLET
400241.3 mg (241.3 mg magnesium) | ORAL_TABLET | Freq: Two times a day (BID) | ORAL | 4 refills | 90.00 days | Status: AC
Start: 2022-06-21 — End: ?

## 2022-06-21 MED ORDER — PREDNISONE 5 MG TABLET
5 mg | ORAL_TABLET | Freq: Every day | ORAL | 12 refills | 30.00 days | Status: AC
Start: 2022-06-21 — End: ?
  Filled 2022-07-12: qty 30, 30d supply, fill #0

## 2022-06-23 MED FILL — CARVEDILOL IMMEDIATE RELEASE 25 MG TABLET: 25 mg | ORAL | 30 days supply | Qty: 60 | Fill #7 | Status: CP

## 2022-06-23 MED FILL — TACROLIMUS XR 4 MG TABLET,EXTENDED RELEASE 24 HR: 4 mg | ORAL | 30 days supply | Qty: 60 | Fill #11 | Status: CP

## 2022-06-23 MED FILL — MYCOPHENOLATE MOFETIL 250 MG CAPSULE: 250 mg | ORAL | 40 days supply | Qty: 240 | Fill #7 | Status: CP

## 2022-06-23 MED FILL — MAGNESIUM OXIDE 400 MG (241.3 MG MAGNESIUM) TABLET: 400 mg (241.3 mg magnesium) | ORAL | 90 days supply | Qty: 360 | Fill #0 | Status: CP

## 2022-06-28 MED FILL — LOSARTAN 25 MG TABLET: 25 mg | ORAL | 30 days supply | Qty: 30 | Fill #5 | Status: CP

## 2022-07-08 ENCOUNTER — Encounter
Admit: 2022-07-08 | Payer: PRIVATE HEALTH INSURANCE | Attending: Student in an Organized Health Care Education/Training Program | Primary: Family Medicine

## 2022-07-08 MED ORDER — TACROLIMUS XR 4 MG TABLET,EXTENDED RELEASE 24 HR
4 mg | ORAL_TABLET | Freq: Every day | ORAL | 12 refills | 30.00 days | Status: AC
Start: 2022-07-08 — End: ?

## 2022-07-12 ENCOUNTER — Encounter: Admit: 2022-07-12 | Payer: PRIVATE HEALTH INSURANCE | Attending: Internal Medicine | Primary: Family Medicine

## 2022-07-12 NOTE — Telephone Encounter
PHARMACY NOTE: I've talked to patient on 07/12/2022 at 2:32 PM to confirm Envarsus dose. Patient confirmed Envarsus dose as 10 mg daily (two 1 mg tablets and two 4 mg tablets). Patient also confirmed [facility] as the preferred pharmacy. Message sent to Transplant pharmacy for any necessary follow-up.Sheliah Mends, CPHTAmbulatory Care Pharmacy Liaison IIITransplant Clinic4/29/20242:32 PM

## 2022-07-13 ENCOUNTER — Encounter: Admit: 2022-07-13 | Payer: PRIVATE HEALTH INSURANCE | Primary: Family Medicine

## 2022-07-13 MED ORDER — TACROLIMUS XR 1 MG TABLET,EXTENDED RELEASE 24 HR
1 mg | ORAL_TABLET | Freq: Every day | ORAL | 12 refills | 30.00 days | Status: AC
Start: 2022-07-13 — End: ?
  Filled 2022-07-20: qty 30, 30d supply, fill #0

## 2022-07-13 NOTE — Progress Notes
Reviewed labs with Dr. Leatha Gilding dose was decreased to 9 mg daily and repeat labs in 1 week.

## 2022-07-20 MED FILL — TACROLIMUS XR 4 MG TABLET,EXTENDED RELEASE 24 HR: 4 mg | ORAL | 30 days supply | Qty: 60 | Fill #0 | Status: CP

## 2022-07-20 MED FILL — CARVEDILOL IMMEDIATE RELEASE 25 MG TABLET: 25 mg | ORAL | 30 days supply | Qty: 60 | Fill #8 | Status: CP

## 2022-07-20 MED FILL — AMLODIPINE 10 MG TABLET: 10 mg | ORAL | 90 days supply | Qty: 90 | Fill #3 | Status: CP

## 2022-07-20 MED FILL — ATORVASTATIN 40 MG TABLET: 40 mg | ORAL | 90 days supply | Qty: 90 | Fill #1 | Status: CP

## 2022-07-27 ENCOUNTER — Encounter: Admit: 2022-07-27 | Payer: PRIVATE HEALTH INSURANCE | Primary: Family Medicine

## 2022-07-27 NOTE — Progress Notes
Called [facility] to follow up if he has done labs. He said that he will try to do them this morning, his last Env dose was yesterday around 11 am and he hasn't taken his meds. We will follow up on the lab results.

## 2022-07-28 ENCOUNTER — Inpatient Hospital Stay: Admit: 2022-07-28 | Discharge: 2022-07-28 | Payer: BLUE CROSS/BLUE SHIELD | Primary: Internal Medicine

## 2022-07-28 DIAGNOSIS — Z6835 Body mass index (BMI) 35.0-35.9, adult: Secondary | ICD-10-CM

## 2022-07-28 DIAGNOSIS — Z79899 Other long term (current) drug therapy: Secondary | ICD-10-CM

## 2022-07-28 DIAGNOSIS — Z940 Kidney transplant status: Secondary | ICD-10-CM

## 2022-07-28 DIAGNOSIS — R0981 Nasal congestion: Secondary | ICD-10-CM

## 2022-07-28 LAB — ALBUMIN/CREATININE PANEL, URINE, RANDOM
BKR ALBUMIN, URINE, RANDOM: 428.5 mg/L (ref 98–107)
BKR ALBUMIN/CREATININE RATIO, URINE, RANDOM: 432.8 mg/g Cr — ABNORMAL HIGH (ref <30.0)
BKR CREATININE, URINE, RANDOM: 99 mg/dL (ref 27.0–33.0)

## 2022-07-28 LAB — BASIC METABOLIC PANEL
BKR ANION GAP: 13 (ref 7–17)
BKR BLOOD UREA NITROGEN: 11 mg/dL (ref 6–20)
BKR BUN / CREAT RATIO: 6.9 % — ABNORMAL LOW (ref 8.0–23.0)
BKR CALCIUM: 8.7 mg/dL — ABNORMAL LOW (ref 8.8–10.2)
BKR CHLORIDE: 100 mmol/L (ref 98–107)
BKR CO2: 26 mmol/L (ref 20–30)
BKR CREATININE: 1.6 mg/dL — ABNORMAL HIGH (ref 0.40–1.30)
BKR EGFR, CREATININE (CKD-EPI 2021): 49 mL/min/{1.73_m2} — ABNORMAL LOW (ref >=60)
BKR GLUCOSE: 92 mg/dL (ref 70–100)
BKR POTASSIUM: 3.8 mmol/L (ref 3.3–5.3)
BKR SODIUM: 139 mmol/L (ref 136–144)

## 2022-07-28 LAB — CBC WITH AUTO DIFFERENTIAL
BKR WAM ABSOLUTE IMMATURE GRANULOCYTES.: 0.02 x 1000/ÂµL (ref 0.00–0.30)
BKR WAM ABSOLUTE LYMPHOCYTE COUNT.: 1.07 x 1000/ÂµL (ref 0.60–3.70)
BKR WAM ABSOLUTE NRBC (2 DEC): 0 x 1000/ÂµL (ref 0.00–1.00)
BKR WAM ANALYZER ANC: 4.17 x 1000/ÂµL (ref 2.00–7.60)
BKR WAM BASOPHIL ABSOLUTE COUNT.: 0.04 x 1000/ÂµL (ref 0.00–1.00)
BKR WAM BASOPHILS: 0.7 % (ref 0.0–1.4)
BKR WAM EOSINOPHIL ABSOLUTE COUNT.: 0.14 x 1000/ÂµL (ref 0.00–1.00)
BKR WAM EOSINOPHILS: 2.3 % (ref 0.0–5.0)
BKR WAM HEMATOCRIT (2 DEC): 45 % (ref 38.50–50.00)
BKR WAM HEMOGLOBIN: 14.9 g/dL (ref 13.2–17.1)
BKR WAM IMMATURE GRANULOCYTES: 0.3 % (ref 0.0–1.0)
BKR WAM LYMPHOCYTES: 17.8 % (ref 17.0–50.0)
BKR WAM MCH (PG): 31.2 pg (ref 27.0–33.0)
BKR WAM MCHC: 33.1 g/dL (ref 31.0–36.0)
BKR WAM MCV: 94.3 fL (ref 80.0–100.0)
BKR WAM MONOCYTE ABSOLUTE COUNT.: 0.57 x 1000/ÂµL (ref 0.00–1.00)
BKR WAM MONOCYTES: 9.5 % — ABNORMAL LOW (ref 4.0–12.0)
BKR WAM MPV: 10.1 fL (ref 8.0–12.0)
BKR WAM NEUTROPHILS: 69.4 % (ref 39.0–72.0)
BKR WAM NUCLEATED RED BLOOD CELLS: 0 % (ref 0.0–1.0)
BKR WAM PLATELETS: 196 x1000/ÂµL (ref 150–420)
BKR WAM RDW-CV: 13.2 % (ref 11.0–15.0)
BKR WAM RED BLOOD CELL COUNT.: 4.77 M/ÂµL (ref 4.00–6.00)
BKR WAM WHITE BLOOD CELL COUNT: 6 x1000/ÂµL (ref 4.0–11.0)

## 2022-07-28 LAB — TACROLIMUS LEVEL     (BH GH L LMW YH): BKR TACROLIMUS BLOOD: 6.8 ng/mL (ref 0.00–1.00)

## 2022-07-28 LAB — PROTEIN, TOTAL W/CREATININE, URINE, RANDOM     (BH GH LMW YH)
BKR CREATININE, URINE, RANDOM: 99 mg/dL
BKR PROTEIN URINE RANDOM: 0.65 g/L (ref 11.0–15.0)
BKR PROTEIN/CREATININE RATIO, URINE, RANDOM: 0.66 mg/mg{creat} — ABNORMAL HIGH (ref <0.10)

## 2022-07-28 LAB — PHOSPHORUS     (BH GH L LMW YH): BKR PHOSPHORUS: 4 mg/dL (ref 2.2–4.5)

## 2022-07-28 LAB — MAGNESIUM: BKR MAGNESIUM: 1.6 mg/dL — ABNORMAL LOW (ref 1.7–2.4)

## 2022-07-28 MED FILL — MYCOPHENOLATE MOFETIL 250 MG CAPSULE: 250 mg | ORAL | 40 days supply | Qty: 240 | Fill #8 | Status: CP

## 2022-07-28 MED FILL — LOSARTAN 25 MG TABLET: 25 mg | ORAL | 30 days supply | Qty: 30 | Fill #6 | Status: CP

## 2022-07-29 ENCOUNTER — Encounter: Admit: 2022-07-29 | Payer: PRIVATE HEALTH INSURANCE | Primary: Family Medicine

## 2022-07-29 NOTE — Progress Notes
Labs reviewed with transplant nephrologist/surgeon Dr. Smith Robert, no changes were made.Electronically Signed by Mikle Bosworth, RN, Jul 29, 2022

## 2022-08-14 MED FILL — PREDNISONE 5 MG TABLET: 5 mg | ORAL | 30 days supply | Qty: 30 | Fill #1 | Status: CP

## 2022-08-14 MED FILL — TACROLIMUS XR 1 MG TABLET,EXTENDED RELEASE 24 HR: 1 mg | ORAL | 30 days supply | Qty: 30 | Fill #1 | Status: CP

## 2022-08-14 MED FILL — TACROLIMUS XR 4 MG TABLET,EXTENDED RELEASE 24 HR: 4 mg | ORAL | 30 days supply | Qty: 60 | Fill #1 | Status: CP

## 2022-08-14 MED FILL — CARVEDILOL IMMEDIATE RELEASE 25 MG TABLET: 25 mg | ORAL | 30 days supply | Qty: 60 | Fill #9 | Status: CP

## 2022-08-24 ENCOUNTER — Telehealth: Admit: 2022-08-24 | Payer: PRIVATE HEALTH INSURANCE | Attending: Cardiovascular Disease | Primary: Family Medicine

## 2022-08-24 NOTE — Telephone Encounter
Received call from ptReports I just got a call from thereReports no message was leftRelayed no encounter on chartEncouraged pt to call [facility] with any questions/concernsPt verbalized understanding

## 2022-08-26 ENCOUNTER — Encounter
Admit: 2022-08-26 | Payer: PRIVATE HEALTH INSURANCE | Attending: Student in an Organized Health Care Education/Training Program | Primary: Family Medicine

## 2022-08-30 ENCOUNTER — Inpatient Hospital Stay: Admit: 2022-08-30 | Discharge: 2022-08-30 | Payer: BLUE CROSS/BLUE SHIELD | Primary: Internal Medicine

## 2022-08-30 DIAGNOSIS — R0981 Nasal congestion: Secondary | ICD-10-CM

## 2022-08-30 DIAGNOSIS — Z6835 Body mass index (BMI) 35.0-35.9, adult: Secondary | ICD-10-CM

## 2022-08-30 DIAGNOSIS — Z79899 Other long term (current) drug therapy: Secondary | ICD-10-CM

## 2022-08-30 DIAGNOSIS — Z940 Kidney transplant status: Secondary | ICD-10-CM

## 2022-08-30 LAB — PHOSPHORUS     (BH GH L LMW YH): BKR PHOSPHORUS: 4.5 mg/dL (ref 2.2–4.5)

## 2022-08-31 ENCOUNTER — Encounter: Admit: 2022-08-31 | Payer: PRIVATE HEALTH INSURANCE | Primary: Family Medicine

## 2022-08-31 ENCOUNTER — Ambulatory Visit
Admit: 2022-08-31 | Payer: BLUE CROSS/BLUE SHIELD | Attending: Student in an Organized Health Care Education/Training Program | Primary: Family Medicine

## 2022-08-31 ENCOUNTER — Inpatient Hospital Stay: Admit: 2022-08-31 | Discharge: 2022-08-31 | Payer: BLUE CROSS/BLUE SHIELD | Primary: Internal Medicine

## 2022-08-31 DIAGNOSIS — Z7960 Long term (current) use of unspecified immunomodulators and immunosuppressants: Secondary | ICD-10-CM

## 2022-08-31 DIAGNOSIS — Z940 Kidney transplant status: Secondary | ICD-10-CM

## 2022-08-31 DIAGNOSIS — R7303 Prediabetes: Secondary | ICD-10-CM

## 2022-08-31 DIAGNOSIS — I10 Essential (primary) hypertension: Secondary | ICD-10-CM

## 2022-08-31 DIAGNOSIS — Z6835 Body mass index (BMI) 35.0-35.9, adult: Secondary | ICD-10-CM

## 2022-08-31 DIAGNOSIS — Z79899 Other long term (current) drug therapy: Secondary | ICD-10-CM

## 2022-08-31 LAB — CBC WITH AUTO DIFFERENTIAL
BKR WAM ABSOLUTE IMMATURE GRANULOCYTES.: 0.02 x 1000/ÂµL (ref 0.00–0.30)
BKR WAM ABSOLUTE LYMPHOCYTE COUNT.: 1.16 x 1000/ÂµL (ref 0.60–3.70)
BKR WAM ABSOLUTE NRBC (2 DEC): 0 x 1000/ÂµL (ref 0.00–1.00)
BKR WAM ANALYZER ANC: 4.38 x 1000/ÂµL (ref 2.00–7.60)
BKR WAM BASOPHIL ABSOLUTE COUNT.: 0.04 x 1000/ÂµL (ref 0.00–1.00)
BKR WAM BASOPHILS: 0.6 % (ref 0.0–1.4)
BKR WAM EOSINOPHIL ABSOLUTE COUNT.: 0.14 x 1000/ÂµL (ref 0.00–1.00)
BKR WAM EOSINOPHILS: 2.2 % (ref 0.0–5.0)
BKR WAM HEMATOCRIT (2 DEC): 41.1 % (ref 38.50–50.00)
BKR WAM HEMOGLOBIN: 13.8 g/dL (ref 13.2–17.1)
BKR WAM IMMATURE GRANULOCYTES: 0.3 % (ref 0.0–1.0)
BKR WAM LYMPHOCYTES: 18.1 % (ref 17.0–50.0)
BKR WAM MCH (PG): 31.6 pg (ref 27.0–33.0)
BKR WAM MCHC: 33.6 g/dL (ref 31.0–36.0)
BKR WAM MCV: 94.1 fL (ref 80.0–100.0)
BKR WAM MONOCYTE ABSOLUTE COUNT.: 0.67 x 1000/ÂµL (ref 0.00–1.00)
BKR WAM MONOCYTES: 10.5 % (ref 4.0–12.0)
BKR WAM MPV: 10.6 fL (ref 8.0–12.0)
BKR WAM NEUTROPHILS: 68.3 % (ref 39.0–72.0)
BKR WAM NUCLEATED RED BLOOD CELLS: 0 % (ref 0.0–1.0)
BKR WAM PLATELETS: 182 x1000/ÂµL (ref 150–420)
BKR WAM RDW-CV: 13.2 % (ref 11.0–15.0)
BKR WAM RED BLOOD CELL COUNT.: 4.37 M/ÂµL (ref 4.00–6.00)
BKR WAM WHITE BLOOD CELL COUNT: 6.4 x1000/ÂµL (ref 4.0–11.0)

## 2022-08-31 LAB — BASIC METABOLIC PANEL
BKR ANION GAP: 13 g/dL (ref 7–17)
BKR BLOOD UREA NITROGEN: 17 mg/dL (ref 6–20)
BKR BUN / CREAT RATIO: 8.9 % (ref 8.0–23.0)
BKR CALCIUM: 8.5 mg/dL — ABNORMAL LOW (ref 8.8–10.2)
BKR CHLORIDE: 103 mmol/L (ref 98–107)
BKR CO2: 25 mmol/L (ref 20–30)
BKR CREATININE: 1.9 mg/dL — ABNORMAL HIGH (ref 0.40–1.30)
BKR EGFR, CREATININE (CKD-EPI 2021): 40 mL/min/{1.73_m2} — ABNORMAL LOW (ref >=60–12.0)
BKR GLUCOSE: 93 mg/dL (ref 70–100)
BKR POTASSIUM: 3.9 mmol/L (ref 3.3–5.3)
BKR SODIUM: 141 mmol/L (ref 136–144)

## 2022-08-31 LAB — PROTEIN, TOTAL W/CREATININE, URINE, RANDOM     (BH GH LMW YH)
BKR CREATININE, URINE, RANDOM: 272 mg/dL
BKR PROTEIN URINE RANDOM: 0.84 g/L
BKR PROTEIN/CREATININE RATIO, URINE, RANDOM: 0.31 mg/mg{creat} — ABNORMAL HIGH (ref <0.10)

## 2022-08-31 LAB — ALBUMIN/CREATININE PANEL, URINE, RANDOM
BKR ALBUMIN, URINE, RANDOM: 539.5 mg/L
BKR ALBUMIN/CREATININE RATIO, URINE, RANDOM: 198.1 mg/g Cr — ABNORMAL HIGH (ref <30.0)
BKR CREATININE, URINE, RANDOM: 272 mg/dL

## 2022-08-31 LAB — PHOSPHORUS     (BH GH L LMW YH): BKR PHOSPHORUS: 3.8 mg/dL (ref 2.2–4.5)

## 2022-08-31 LAB — MAGNESIUM: BKR MAGNESIUM: 2.1 mg/dL (ref 1.7–2.4)

## 2022-08-31 MED ORDER — LOSARTAN 50 MG TABLET
50 mg | ORAL_TABLET | Freq: Every day | ORAL | 4 refills | 90.00 days | Status: AC
Start: 2022-08-31 — End: ?
  Filled 2022-09-08: qty 90, 90d supply, fill #0

## 2022-08-31 NOTE — Unmapped
MD unavailable, verbal obtained from Dr. Rao orders placed.

## 2022-09-01 ENCOUNTER — Encounter: Admit: 2022-09-01 | Payer: PRIVATE HEALTH INSURANCE | Primary: Family Medicine

## 2022-09-01 LAB — TACROLIMUS LEVEL     (BH GH L LMW YH): BKR TACROLIMUS BLOOD: 7.4 ng/mL

## 2022-09-01 NOTE — Unmapped
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 Bactrim switched to AtovaquoneINTERIM HISTORY Adam Harper is a 62 yo male with history of ESKD due to FSGS s/p DDKT 11/05/19, thoracic aortic aneursym, HTN, obesity, cirrhosis, who presents for follow up. Adam Harper presents today with his wife. They both report he has a large appetite and more recently has ben consuming a lot of snacks as well as drinking multiple bottles of soda daily. He denies using CPAP machine stating it is due to nasal issues. 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 Overview:  Added automatically from request for surgery [id number] Overview:  Added automatically from request for surgery [id number]  Anemia in ESRD (end-stage renal disease) (HC Code) 10/24/2014  Added automatically from request for surgery [id number]  Added automatically from request for surgery [id number] Overview:  Added automatically from request for surgery [id number]  AV fistula infection (HC Code) (HC CODE) (HC Code)   Cirrhosis (HC Code)   ESRD on hemodialysis (HC Code) (HC CODE) (HC Code)   Mon-Wed-Fri  GERD (gastroesophageal reflux disease)   Hypercholesteremia   Hyperparathyroidism (HC Code)   Hypertension   Infected prosthetic vascular graft, initial encounter (HC Code) (HC CODE) (HC Code) 10/24/2014  Influenza A   Morbid obesity (HC Code) 02/03/2018  Obstructive sleep apnea   pt uses CPAP  Secondary hyperparathyroidism of renal origin (HC Code)   Thoracic aortic aneurysm (HC Code)   Past Surgical History: Procedure Laterality Date  AV FISTULA PLACEMENT Left   left lower arm unsuccessful, then moved to upper arm 2011, then had problems in 10/2013 - infection  No family history on file. Social History Tobacco Use  Smoking status: Former   Current packs/day: 0.25   Average packs/day: 0.3 packs/day for 25.0 years (6.3 ttl pk-yrs)   Types: Cigarettes  Smokeless tobacco: Never Substance Use Topics  Alcohol use: No  Past medical, surgical, family and social history reviewed. No changes at this time.MEDICATIONS Outpatient Encounter Medications as of 08/31/2022 Medication Sig  amLODIPine (NORVASC) 10 mg tablet Take 1 tablet (10 mg total) by mouth every morning.  atorvastatin (LIPITOR) 40 mg tablet Take 1 tablet (40 mg total) by mouth daily.  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 Please dispense supplies covered by patient's insurance.  carvediloL (COREG) 25 mg Immediate Release tablet Take 1 tablet (25 mg total) by mouth 2 (two) times daily with breakfast and dinner.  diclofenac (VOLTAREN) 1 %  gel Apply topically 4 (four) times daily. (Patient not taking: Reported on 05/28/2022)  dulaglutide 3 mg/0.5 mL PnIj Inject 0.5 mLs (3 mg total) under the skin every 7 days.  ergocalciferol (VITAMIN D2) 1,250 mcg (50,000 unit) capsule Take 1 capsule (50,000 Units total) by mouth once a week. (Patient not taking: Reported on 05/28/2022)  lancets Use to check blood sugar daily [Type 2 Diabetes: E11.9]. Please dispense supplies covered by patient's insurance.  lidocaine maalox diphenhydramine nystatin (MAGIC MOUTHWASH + NYSTATIN) 1:1:1:1 Swish and swallow 10 mLs 4 (four) times daily. (Patient not taking: Reported on 05/28/2022)  losartan (COZAAR) 25 mg tablet Take 1 tablet (25 mg total) by mouth daily.  magnesium oxide (MAG-OX) 400 mg (241.3 mg magnesium) tablet Take 2 tablets (800 mg total) by mouth 2 (two) times daily.  mycophenolate mofetil (CELLCEPT) 250 mg capsule Take 3 capsules (750 mg total) by mouth 2 (two) times daily.  omeprazole (PRILOSEC) 20 mg capsule Take 1 capsule (20 mg total) by mouth daily.  predniSONE (DELTASONE) 5 mg tablet Take 1 tablet (5 mg total) by mouth daily.  tacrolimus XR (ENVARSUS XR) 1 mg 24 hr tablet Take 1 tablet (1 mg total) by mouth daily. Take with 4 mg tabs for total dose 9 mg daily  tacrolimus XR (ENVARSUS XR) 4 mg 24 hr tablet Take 2 tablets (8 mg total) by mouth daily. Take with 1 mg tabs for total dose 9 mg daily  tadalafiL (CIALIS) 20 mg tablet Take 1 tablet (20 mg total) by mouth every other day as needed for erectile dysfunction.  urea (CARMOL) 20 % cream Apply topically as needed. (Patient not taking: Reported on 05/28/2022)  [DISCONTINUED] chlorthalidone (HYGROTEN) 50 mg tablet Take 1 tablet (50 mg total) by mouth daily. (Patient not taking: Reported on 05/28/2022) No Known AllergiesPHYSICAL EXAM  BP 117/66 (Site: l a, Position: Sitting, Cuff Size: Large)  - Pulse 73  - Temp 97.9 ?F (36.6 ?C) (Temporal)  - Wt 123.4 kg  - SpO2 95%  - BMI 41.23 kg/m? Wt Readings from Last 4 Encounters: 08/31/22 123.4 kg 05/28/22 123.9 kg 02/26/22 122.2 kg 10/02/21 117.8 kg   Gen: obese male , not in acute distressHEENT: atraumatic, pale palpebral conjunctivaNeck: no cervical lymphadenopathy, trachea midlineCV: regular rate and rhythm, distinct S1 and S2, no murmurs appreciated Resp: clear breath sounds bilaterally, no wheezing or crackles appreciatedAbd: non-distended, normoactive bowel sounds, non-tender to palpationExt: no pedal edemaNeuro: Awake, alert, no tremors, motor function grossly intactPERTINENT LABS Lab Results Component Value Date  WBC 6.0 07/28/2022  HGB 14.9 07/28/2022  HCT 45.00 07/28/2022  PLT 196 07/28/2022  GLU 92 07/28/2022  BUN 11 07/28/2022  CREATININE 1.60 (H) 07/28/2022  CO2 26 07/28/2022  CL 100 07/28/2022  NA 139 07/28/2022  K 3.8 07/28/2022  CALCIUM 8.7 (L) 07/28/2022  EGFR 8 06/21/2012  PRCRRAU 0.66 (H) 07/28/2022  PHOS 4.5 08/30/2022  PTH 49.8 06/14/2022  VITAMIND25HY 23 06/05/2020  HGBA1C 6.2 (H) 11/13/2021  IRON 80 01/30/2020  TIBC 275 01/30/2020  FERRITIN 1,163 (H) 01/30/2020 Lab Results Component Value Date  TACROLIMUS 6.8 07/28/2022 ASSESSMENT AND PLAN CLAY SOLUM is seen in clinic today for a follow up visit regarding the patiet's renal transplant.Allograft functionBL CR 1.8-2.1 Last urine protein up 0.4->0.66 but now 0.3ImmunosuppressionEnvarsus 9 mg, MMF 750 mg BID, pred 5 mg Env level appropriate last month, tac level remains in processProphylaxis and SurveillanceBK ND 3/14/24Blood Pressure managementAmlodipine 10 mg, coreg 25 mg BID, losartan 25 mg Stop amlodipine and increase to losartan  50 mg daily AnemiaStable Pre diabetic with A1C 6, obesity 41.2 kg/m2, OSA Will refer to metabolic weight loss clinicAlso will request appointment with Dr Carney Bern (prev followed w Dr. Marya Amsler to follow up with his sleep physician reg CPAP which he isnt consistently using Will try to help with ENT referral Druscilla Brownie Professor of Medicine Transplant Nephrology 08/31/2022 10:12 AM

## 2022-09-01 NOTE — Unmapped
Reviewed labs with Dr. Smith Robert. No changes to IS regimen Patient was referred to metabolic clinic for weight loss management. Also, sent message to Dr. Jayme Cloud so he can follow up with her about trulicity management

## 2022-09-03 ENCOUNTER — Ambulatory Visit
Admit: 2022-09-03 | Payer: BLUE CROSS/BLUE SHIELD | Attending: Student in an Organized Health Care Education/Training Program | Primary: Family Medicine

## 2022-09-03 ENCOUNTER — Encounter
Admit: 2022-09-03 | Payer: PRIVATE HEALTH INSURANCE | Attending: Student in an Organized Health Care Education/Training Program | Primary: Family Medicine

## 2022-09-03 ENCOUNTER — Inpatient Hospital Stay: Admit: 2022-09-03 | Discharge: 2022-09-03 | Payer: BLUE CROSS/BLUE SHIELD | Primary: Internal Medicine

## 2022-09-03 DIAGNOSIS — E785 Hyperlipidemia, unspecified: Secondary | ICD-10-CM

## 2022-09-03 DIAGNOSIS — Z7960 Long term (current) use of unspecified immunomodulators and immunosuppressants: Secondary | ICD-10-CM

## 2022-09-03 DIAGNOSIS — Z940 Kidney transplant status: Secondary | ICD-10-CM

## 2022-09-03 DIAGNOSIS — E139 Other specified diabetes mellitus without complications: Secondary | ICD-10-CM

## 2022-09-03 DIAGNOSIS — N2581 Secondary hyperparathyroidism of renal origin: Secondary | ICD-10-CM

## 2022-09-03 DIAGNOSIS — K219 Gastro-esophageal reflux disease without esophagitis: Secondary | ICD-10-CM

## 2022-09-03 DIAGNOSIS — I10 Essential (primary) hypertension: Secondary | ICD-10-CM

## 2022-09-03 DIAGNOSIS — E7800 Pure hypercholesterolemia, unspecified: Secondary | ICD-10-CM

## 2022-09-03 DIAGNOSIS — D649 Anemia, unspecified: Secondary | ICD-10-CM

## 2022-09-03 DIAGNOSIS — G4733 Obstructive sleep apnea (adult) (pediatric): Secondary | ICD-10-CM

## 2022-09-03 DIAGNOSIS — I770 Arteriovenous fistula, acquired: Secondary | ICD-10-CM

## 2022-09-03 DIAGNOSIS — I712 Thoracic aortic aneurysm (HC Code): Secondary | ICD-10-CM

## 2022-09-03 DIAGNOSIS — N186 End stage renal disease: Secondary | ICD-10-CM

## 2022-09-03 DIAGNOSIS — K7460 Unspecified cirrhosis of liver: Secondary | ICD-10-CM

## 2022-09-03 DIAGNOSIS — T827XXA Infection and inflammatory reaction due to other cardiac and vascular devices, implants and grafts, initial encounter: Secondary | ICD-10-CM

## 2022-09-03 DIAGNOSIS — E213 Hyperparathyroidism, unspecified: Secondary | ICD-10-CM

## 2022-09-03 DIAGNOSIS — J101 Influenza due to other identified influenza virus with other respiratory manifestations: Secondary | ICD-10-CM

## 2022-09-03 LAB — CBC WITH AUTO DIFFERENTIAL
BKR WAM ABSOLUTE IMMATURE GRANULOCYTES.: 0.02 x 1000/ÂµL (ref 0.00–0.30)
BKR WAM ABSOLUTE LYMPHOCYTE COUNT.: 1.12 x 1000/ÂµL (ref 0.60–3.70)
BKR WAM ABSOLUTE NRBC (2 DEC): 0 x 1000/ÂµL (ref 0.00–1.00)
BKR WAM ANALYZER ANC: 3.63 x 1000/ÂµL (ref 2.00–7.60)
BKR WAM BASOPHIL ABSOLUTE COUNT.: 0.02 x 1000/ÂµL (ref 0.00–1.00)
BKR WAM BASOPHILS: 0.4 % (ref 0.0–1.4)
BKR WAM EOSINOPHIL ABSOLUTE COUNT.: 0.09 x 1000/ÂµL (ref 0.00–1.00)
BKR WAM EOSINOPHILS: 1.7 % (ref 0.0–5.0)
BKR WAM HEMATOCRIT (2 DEC): 43.1 % (ref 38.50–50.00)
BKR WAM HEMOGLOBIN: 13.9 g/dL (ref 13.2–17.1)
BKR WAM IMMATURE GRANULOCYTES: 0.4 % (ref 0.0–1.0)
BKR WAM LYMPHOCYTES: 20.6 % (ref 17.0–50.0)
BKR WAM MCH (PG): 31.2 pg (ref 27.0–33.0)
BKR WAM MCHC: 32.3 g/dL (ref 31.0–36.0)
BKR WAM MCV: 96.9 fL (ref 80.0–100.0)
BKR WAM MONOCYTE ABSOLUTE COUNT.: 0.57 x 1000/ÂµL (ref 0.00–1.00)
BKR WAM MONOCYTES: 10.5 % (ref 4.0–12.0)
BKR WAM MPV: 10.2 fL (ref 8.0–12.0)
BKR WAM NEUTROPHILS: 66.4 % (ref 39.0–72.0)
BKR WAM NUCLEATED RED BLOOD CELLS: 0 % (ref 0.0–1.0)
BKR WAM PLATELETS: 206 x1000/ÂµL (ref 150–420)
BKR WAM RDW-CV: 13.3 % (ref 11.0–15.0)
BKR WAM RED BLOOD CELL COUNT.: 4.45 M/ÂµL (ref 4.00–6.00)
BKR WAM WHITE BLOOD CELL COUNT: 5.5 x1000/ÂµL (ref 4.0–11.0)

## 2022-09-03 LAB — PROTEIN, TOTAL W/CREATININE, URINE, RANDOM     (BH GH LMW YH)
BKR CREATININE, URINE, RANDOM: 166 mg/dL — ABNORMAL HIGH
BKR PROTEIN URINE RANDOM: 0.64 g/L
BKR PROTEIN/CREATININE RATIO, URINE, RANDOM: 0.39 mg/mg{creat} — ABNORMAL HIGH (ref <0.10)

## 2022-09-03 LAB — ALBUMIN/CREATININE PANEL, URINE, RANDOM
BKR ALBUMIN, URINE, RANDOM: 391.2 mg/L
BKR ALBUMIN/CREATININE RATIO, URINE, RANDOM: 236.4 mg/g{creat} — ABNORMAL HIGH (ref <30.0)
BKR CREATININE, URINE, RANDOM: 166 mg/dL

## 2022-09-03 LAB — BASIC METABOLIC PANEL
BKR ANION GAP: 12 (ref 7–17)
BKR BLOOD UREA NITROGEN: 18 mg/dL (ref 6–20)
BKR BUN / CREAT RATIO: 9.3 (ref 8.0–23.0)
BKR CALCIUM: 8.9 mg/dL (ref 8.8–10.2)
BKR CHLORIDE: 101 mmol/L (ref 98–107)
BKR CO2: 28 mmol/L (ref 20–30)
BKR CREATININE: 1.93 mg/dL — ABNORMAL HIGH (ref 0.40–1.30)
BKR EGFR, CREATININE (CKD-EPI 2021): 39 mL/min/{1.73_m2} — ABNORMAL LOW (ref >=60)
BKR GLUCOSE: 83 mg/dL (ref 70–100)
BKR POTASSIUM: 3.7 mmol/L (ref 3.3–5.3)
BKR SODIUM: 141 mmol/L (ref 136–144)

## 2022-09-03 LAB — TACROLIMUS LEVEL     (BH GH L LMW YH): BKR TACROLIMUS BLOOD: 7.3 ng/mL

## 2022-09-03 LAB — HEMOGLOBIN A1C
BKR ESTIMATED AVERAGE GLUCOSE: 128 mg/dL
BKR HEMOGLOBIN A1C: 6.1 % — ABNORMAL HIGH (ref 4.0–5.6)

## 2022-09-03 LAB — PHOSPHORUS     (BH GH L LMW YH): BKR PHOSPHORUS: 4 mg/dL (ref 2.2–4.5)

## 2022-09-03 LAB — TSH W/REFLEX TO FT4     (BH GH LMW Q YH): BKR THYROID STIMULATING HORMONE: 1.65 ÂµIU/mL

## 2022-09-03 LAB — MAGNESIUM: BKR MAGNESIUM: 1.9 mg/dL (ref 1.7–2.4)

## 2022-09-03 NOTE — Unmapped
Transplant-Endocrinology Clinic HPI: Adam Harper is a 62 y.o. 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  He has no complications from diabetes. Interim Hx:LV with Dr. Dinah Beers 07/2021, given control he was discharge from the clinic and sent back to PCP. He returned as it seemed there was a question about his diagnosis. No isses with his kidney transplant, Cr has been fluctuating, however no major signs of rejection. Hypoglycemia: No severe hypoglycemia requiring assistanceCurrent Regimen:Trulicity 3mg  weeklyPrevious Regimen: noneBlood sugar review:BS reviewed by:Per patient BG 100-125Meds:Current Medications Medication Sig  atorvastatin (LIPITOR) 40 mg tablet Take 1 tablet (40 mg total) by mouth daily.  carvediloL (COREG) 25 mg Immediate Release tablet Take 1 tablet (25 mg total) by mouth 2 (two) times daily with breakfast and dinner.  dulaglutide 3 mg/0.5 mL PnIj Inject 0.5 mLs (3 mg total) under the skin every 7 days.  magnesium oxide (MAG-OX) 400 mg (241.3 mg magnesium) tablet Take 2 tablets (800 mg total) by mouth 2 (two) times daily.  mycophenolate mofetil (CELLCEPT) 250 mg capsule Take 3 capsules (750 mg total) by mouth 2 (two) times daily.  omeprazole (PRILOSEC) 20 mg capsule Take 1 capsule (20 mg total) by mouth daily.  predniSONE (DELTASONE) 5 mg tablet Take 1 tablet (5 mg total) by mouth daily.  tacrolimus XR (ENVARSUS XR) 1 mg 24 hr tablet Take 1 tablet (1 mg total) by mouth daily. Take with 4 mg tabs for total dose 9 mg daily  tacrolimus XR (ENVARSUS XR) 4 mg 24 hr tablet Take 2 tablets (8 mg total) by mouth daily. Take with 1 mg tabs for total dose 9 mg daily  tadalafiL (CIALIS) 20 mg tablet Take 1 tablet (20 mg total) by mouth every other day as needed for erectile dysfunction. Allergies: Patient has no known allergies.ROS: The ROS has been reviewed and, except for where noted above, the remaining 12-point systems review was otherwise negative.PE: BP (!) 147/62 (Site: r a, Position: Sitting, Cuff Size: Large)  - Pulse 73  - Temp 97 ?F (36.1 ?C) (Temporal)  - Wt 123.4 kg  - SpO2 96%  - BMI 41.23 kg/m? Gen: No acute distressLungs: Clear to auscultation bilaterallyCV: Regular Rate and Rhythm, nl S1/s2Neurologic: CN 2-12 grossly intact. Skin: No darkening of skin, no acanthosis, no striaeExt: 	LE: no bruising, proximal muscle weakness, or edema Labs/Imaging:Lab Results Component Value Date  HGBA1C 6.2 (H) 11/13/2021  HGBA1C 6.4 (H) 01/19/2021  HGBA1C 6.6 (H) 12/19/2020  CHOL 137 08/05/2020  LDL 59 08/05/2020  HDL 36 (L) 08/05/2020  TRIG 209 (H) 08/05/2020  TSH 2.080 06/05/2020  HGB 13.8 08/31/2022  CREATININE 1.90 (H) 08/31/2022  EGFRNONAFRAM 36 09/25/2020  EGFRAFRAMER 44 09/25/2020  ALBURR 539.5 08/31/2022  ALBCREATR 198.1 (H) 08/31/2022  AST 15 11/18/2019  ALT 16 11/18/2019 No results found for: GAD-65 AB, IA-2 ANTIBODY, GAD65, IA-2, C PEPTIDE, ISLET CELL ANTIBODY SCREEN, GAD65, IA-2, AND INSULIN AUTOANTIBODY Impression: KOHLER PELLERITO is presenting today as a follow up. From a glycemic standpoint he is well controlled and has been tolerating the trulicity. Plan to repeat an A1c, if it is above 6.5 will increase the trulicity, however if it is less than 6.5 will continue with the current dose. He was also counseled regarding limiting foods high in starches and sugars  as well as processed foods. Recommendations:#Diabetes- A1c level- Trulicity 3mg  weekly- Target Fasting BG 80-140- Check BS 1X/day. Check BS before driving and eat a snack if BS <100. Keep organized log and bring it next time. Carry glucose tablets or snacks in case of hypoglycemia.- Diabetic diet, moderate intensity exercise 150 minutes/week, weight loss 5-10% of body weight.- F/u with ophthalmology for complete eye exam periodically.- Repeat TSH levels#Hyperlipidemia- Due lipid [panelRTC 1 year Mychart message okayElectronically Signed by Kirke Corin, MD

## 2022-09-07 ENCOUNTER — Encounter: Admit: 2022-09-07 | Payer: PRIVATE HEALTH INSURANCE | Attending: Internal Medicine | Primary: Family Medicine

## 2022-09-07 DIAGNOSIS — R0981 Nasal congestion: Secondary | ICD-10-CM

## 2022-09-07 DIAGNOSIS — G4733 Obstructive sleep apnea (adult) (pediatric): Secondary | ICD-10-CM

## 2022-09-08 MED FILL — MYCOPHENOLATE MOFETIL 250 MG CAPSULE: 250 mg | ORAL | 40 days supply | Qty: 240 | Fill #9 | Status: CP

## 2022-09-08 MED FILL — TACROLIMUS XR 1 MG TABLET,EXTENDED RELEASE 24 HR: 1 mg | ORAL | 30 days supply | Qty: 30 | Fill #2 | Status: CP

## 2022-09-08 MED FILL — PREDNISONE 5 MG TABLET: 5 mg | ORAL | 30 days supply | Qty: 30 | Fill #2 | Status: CP

## 2022-09-08 MED FILL — CARVEDILOL IMMEDIATE RELEASE 25 MG TABLET: 25 mg | ORAL | 30 days supply | Qty: 60 | Fill #10 | Status: CP

## 2022-09-10 ENCOUNTER — Encounter: Admit: 2022-09-10 | Payer: PRIVATE HEALTH INSURANCE | Primary: Family Medicine

## 2022-10-08 ENCOUNTER — Ambulatory Visit: Admit: 2022-10-08 | Payer: BLUE CROSS/BLUE SHIELD | Attending: Cardiovascular Disease

## 2022-10-12 ENCOUNTER — Encounter: Admit: 2022-10-12 | Payer: PRIVATE HEALTH INSURANCE | Attending: Cardiovascular Disease

## 2022-10-12 ENCOUNTER — Ambulatory Visit: Admit: 2022-10-12 | Payer: BLUE CROSS/BLUE SHIELD | Attending: Cardiovascular Disease

## 2022-10-12 DIAGNOSIS — I770 Arteriovenous fistula, acquired: Secondary | ICD-10-CM

## 2022-10-12 DIAGNOSIS — I10 Essential (primary) hypertension: Secondary | ICD-10-CM

## 2022-10-12 DIAGNOSIS — E7800 Pure hypercholesterolemia, unspecified: Secondary | ICD-10-CM

## 2022-10-12 DIAGNOSIS — K219 Gastro-esophageal reflux disease without esophagitis: Secondary | ICD-10-CM

## 2022-10-12 DIAGNOSIS — Z940 Kidney transplant status: Secondary | ICD-10-CM

## 2022-10-12 DIAGNOSIS — T827XXA Infection and inflammatory reaction due to other cardiac and vascular devices, implants and grafts, initial encounter: Secondary | ICD-10-CM

## 2022-10-12 DIAGNOSIS — J101 Influenza due to other identified influenza virus with other respiratory manifestations: Secondary | ICD-10-CM

## 2022-10-12 DIAGNOSIS — E785 Hyperlipidemia, unspecified: Secondary | ICD-10-CM

## 2022-10-12 DIAGNOSIS — G4733 Obstructive sleep apnea (adult) (pediatric): Secondary | ICD-10-CM

## 2022-10-12 DIAGNOSIS — D649 Anemia, unspecified: Secondary | ICD-10-CM

## 2022-10-12 DIAGNOSIS — I712 Thoracic aortic aneurysm (HC Code): Secondary | ICD-10-CM

## 2022-10-12 DIAGNOSIS — K7460 Unspecified cirrhosis of liver: Secondary | ICD-10-CM

## 2022-10-12 DIAGNOSIS — N2581 Secondary hyperparathyroidism of renal origin: Secondary | ICD-10-CM

## 2022-10-12 DIAGNOSIS — E213 Hyperparathyroidism, unspecified: Secondary | ICD-10-CM

## 2022-10-12 DIAGNOSIS — N186 End stage renal disease: Secondary | ICD-10-CM

## 2022-10-12 DIAGNOSIS — E781 Pure hyperglyceridemia: Secondary | ICD-10-CM

## 2022-10-12 MED FILL — MAGNESIUM OXIDE 400 MG (241.3 MG MAGNESIUM) TABLET: 400 mg (241.3 mg magnesium) | ORAL | 90 days supply | Qty: 360 | Fill #1 | Status: CP

## 2022-10-12 MED FILL — TACROLIMUS XR 1 MG TABLET,EXTENDED RELEASE 24 HR: 1 mg | ORAL | 30 days supply | Qty: 30 | Fill #3 | Status: CP

## 2022-10-12 MED FILL — OMEPRAZOLE 20 MG CAPSULE,DELAYED RELEASE: 20 mg | ORAL | 90 days supply | Qty: 90 | Fill #1 | Status: CP

## 2022-10-12 MED FILL — PREDNISONE 5 MG TABLET: 5 mg | ORAL | 30 days supply | Qty: 30 | Fill #3 | Status: CP

## 2022-10-12 MED FILL — TACROLIMUS XR 4 MG TABLET,EXTENDED RELEASE 24 HR: 4 mg | ORAL | 30 days supply | Qty: 60 | Fill #2 | Status: CP

## 2022-10-12 MED FILL — MYCOPHENOLATE MOFETIL 250 MG CAPSULE: 250 mg | ORAL | 40 days supply | Qty: 240 | Fill #10 | Status: CP

## 2022-10-12 MED FILL — CARVEDILOL IMMEDIATE RELEASE 25 MG TABLET: 25 mg | ORAL | 30 days supply | Qty: 60 | Fill #11 | Status: CP

## 2022-10-12 MED FILL — ATORVASTATIN 40 MG TABLET: 40 mg | ORAL | 90 days supply | Qty: 90 | Fill #2 | Status: CP

## 2022-10-12 NOTE — Patient Instructions
MRA chest over the next year to evaluate aorta

## 2022-10-12 NOTE — Progress Notes
July 30, 2024History of Present Illness59 year old man with a history of prior end-stage renal disease thought to be secondary to FSGS now status post renal transplantation, hypertension, dyslipidemia and sleep apnea, presents for routine follow-up.  He complains of stable intermittent morning headaches, which he attributes to sleeping near a window and the cold morning air.  He is not using CPAP.Marland Kitchen He denies any chest pain, arm pain, neck pain, or jaw pain. He does, however, report occasional shortness of breath on exertion, specifically while walking.  This is unchanged.  He has gained 17 lb.The patient's medication regimen includes carvedilol 25 mg b.i.d., losartan 50, and atorvastatin 40 for cholesterol. He was previously on amlodipine and chlorthalidone.  Losartan was increased in amlodipine discontinued in the setting of worsening proteinuria per his description.The patient has been diagnosed with sleep apnea but reports inconsistent use of his CPAP machine due to discomfort when his nose is wet. He has no new symptoms and his condition appears to be stable on his current medication regimen.The patient's most recent echocardiogram showed normal pumping function with an ejection fraction of 69%, moderate thickening of the left ventricle, and no significant valve problems. The maximal size of the aorta was 4.2 cm, which has been stable. The patient's EKG showed a normal sinus rhythm and an incomplete left bundle branch block, which has not changed since his last visit.The patient's hypertension appears to be well controlled on his current medication regimen, with a recent reading of 128/70 in the left arm.Physical ExamVITALS: BP- 128/70, heart rate is 77 and regular, respiratory rate of 14.  Weight is 276 lb up 17 lb compared to our last visit 1 year ago.  Anicteric sclera with no xanthelasma.  Jugular venous pulsations difficult to assess in the setting of body habitus.  There are no audible carotid bruits.Lungs are clear to auscultation bilaterally.  Cardiac exam reveals normal S1, physiologically split S2, likely S4.  Abdomen is soft, nontender and obese.  Difficult to appreciate hepatosplenomegaly.  There is trace edema bilaterally with intact distal pulses.ResultsRADIOLOGYCT scan of the aorta: Aorta measured 4.1 cm (09/09/2020)DIAGNOSTICEchocardiogram: Normal pumping function, ejection fraction 69%, moderate concentric left ventricular hypertrophy, no significant valve problems (04/05/2022)EKG: Sinus rhythm at a rate of 77, incomplete left bundle branch block (10/12/2022) independently interpreted by me.  Unchanged compared to prior.Assessment & PlanHypertension: Well controlled on Losartan 50mg  daily. Amlodipine was discontinued and losartan increased by the nephrologist due to proteinuria.-Continue Losartan 50mg  daily, carvedilol 25 mg b.i.d..Morning Headaches: Mild, intermittent, possibly related to sleeping near a window. No other neurological symptoms reported.-Continue to monitor symptoms.Hyperlipidemia:  Last LDL cholesterol was 59 in May of 2022.-Check lipid panel at next blood draw.Dyspnea on exertion:  This is relatively stable.  I would consider stress testing in the future for any progressive symptoms.  He would likely need a PET perfusion imaging stress test.Aortic Dilation: Stable at 4.2 cm on last echocardiogram (04/05/2022). Last Purdy scan (09/09/2020) showed aorta at 4.1 cm.-Order MRA of chest to monitor aortic size.Sleep Apnea: Patient reports difficulty tolerating CPAP machine.-Encourage continued use of CPAP as tolerated.Follow-up in 1 year unless changes occur.

## 2022-10-25 ENCOUNTER — Encounter: Admit: 2022-10-25 | Payer: PRIVATE HEALTH INSURANCE | Attending: Cardiovascular Disease | Primary: Internal Medicine

## 2022-10-25 ENCOUNTER — Telehealth: Admit: 2022-10-25 | Payer: PRIVATE HEALTH INSURANCE | Primary: Internal Medicine

## 2022-10-25 ENCOUNTER — Inpatient Hospital Stay: Admit: 2022-10-25 | Discharge: 2022-10-25 | Payer: BLUE CROSS/BLUE SHIELD | Primary: Internal Medicine

## 2022-10-25 DIAGNOSIS — I10 Essential (primary) hypertension: Secondary | ICD-10-CM

## 2022-10-25 DIAGNOSIS — Z7960 Long term (current) use of unspecified immunomodulators and immunosuppressants: Secondary | ICD-10-CM

## 2022-10-25 DIAGNOSIS — E785 Hyperlipidemia, unspecified: Secondary | ICD-10-CM

## 2022-10-25 DIAGNOSIS — Z940 Kidney transplant status: Secondary | ICD-10-CM

## 2022-10-25 DIAGNOSIS — E781 Pure hyperglyceridemia: Secondary | ICD-10-CM

## 2022-10-25 LAB — CBC WITH AUTO DIFFERENTIAL
BKR WAM ABSOLUTE IMMATURE GRANULOCYTES.: 0.04 x 1000/ÂµL (ref 0.00–0.30)
BKR WAM ABSOLUTE LYMPHOCYTE COUNT.: 0.94 x 1000/ÂµL (ref 0.60–3.70)
BKR WAM ABSOLUTE NRBC (2 DEC): 0 x 1000/ÂµL (ref 0.00–1.00)
BKR WAM ANC (ABSOLUTE NEUTROPHIL COUNT): 4.39 x 1000/ÂµL (ref 2.00–7.60)
BKR WAM BASOPHIL ABSOLUTE COUNT.: 0.03 x 1000/ÂµL (ref 0.00–1.00)
BKR WAM BASOPHILS: 0.5 % (ref 0.0–1.4)
BKR WAM EOSINOPHIL ABSOLUTE COUNT.: 0.17 x 1000/ÂµL (ref 0.00–1.00)
BKR WAM EOSINOPHILS: 2.7 % (ref 0.0–5.0)
BKR WAM HEMATOCRIT (2 DEC): 43.6 % (ref 38.50–50.00)
BKR WAM HEMOGLOBIN: 14.1 g/dL (ref 13.2–17.1)
BKR WAM IMMATURE GRANULOCYTES: 0.6 % (ref 0.0–1.0)
BKR WAM LYMPHOCYTES: 15.1 % — ABNORMAL LOW (ref 17.0–50.0)
BKR WAM MCH (PG): 31.3 pg (ref 27.0–33.0)
BKR WAM MCHC: 32.3 g/dL (ref 31.0–36.0)
BKR WAM MCV: 96.7 fL (ref 80.0–100.0)
BKR WAM MONOCYTE ABSOLUTE COUNT.: 0.65 x 1000/ÂµL (ref 0.00–1.00)
BKR WAM MONOCYTES: 10.5 % (ref 4.0–12.0)
BKR WAM MPV: 10.5 fL (ref 8.0–12.0)
BKR WAM NEUTROPHILS: 70.6 % (ref 39.0–72.0)
BKR WAM NUCLEATED RED BLOOD CELLS: 0 % (ref 0.0–1.0)
BKR WAM PLATELETS: 197 x1000/ÂµL (ref 150–420)
BKR WAM RDW-CV: 13.4 % (ref 11.0–15.0)
BKR WAM RED BLOOD CELL COUNT.: 4.51 M/ÂµL (ref 4.00–6.00)
BKR WAM WHITE BLOOD CELL COUNT: 6.2 x1000/ÂµL (ref 4.0–11.0)

## 2022-10-25 LAB — LDL CHOLESTEROL, DIRECT: BKR LDL CHOLESTEROL DIRECT: 54 mg/dL — ABNORMAL HIGH

## 2022-10-25 LAB — PHOSPHORUS     (BH GH L LMW YH): BKR PHOSPHORUS: 3.1 mg/dL (ref 2.2–4.5)

## 2022-10-25 LAB — BASIC METABOLIC PANEL
BKR ANION GAP: 14 g/dL (ref 7–17)
BKR BLOOD UREA NITROGEN: 16 mg/dL (ref 6–20)
BKR BUN / CREAT RATIO: 9.4 % — ABNORMAL LOW (ref 8.0–23.0)
BKR CALCIUM: 8.6 mg/dL — ABNORMAL LOW (ref 8.8–10.2)
BKR CHLORIDE: 101 mmol/L (ref 98–107)
BKR CO2: 27 mmol/L (ref 20–30)
BKR CREATININE: 1.7 mg/dL — ABNORMAL HIGH (ref 0.40–1.30)
BKR EGFR, CREATININE (CKD-EPI 2021): 46 mL/min/{1.73_m2} — ABNORMAL LOW (ref >=60–12.0)
BKR GLUCOSE: 89 mg/dL (ref 70–100)
BKR POTASSIUM: 3.7 mmol/L (ref 3.3–5.3)
BKR SODIUM: 142 mmol/L (ref 136–144)

## 2022-10-25 LAB — LIPID PANEL
BKR CHOLESTEROL/HDL RATIO: 3.7 (ref 0.0–5.0)
BKR CHOLESTEROL: 110 mg/dL
BKR HDL CHOLESTEROL: 30 mg/dL — ABNORMAL LOW (ref >=40)
BKR LDL CHOLESTEROL SAMPSON CALCULATED: 45 mg/dL
BKR TRIGLYCERIDES: 221 mg/dL — ABNORMAL HIGH

## 2022-10-25 LAB — ALBUMIN/CREATININE PANEL, URINE, RANDOM
BKR ALBUMIN, URINE, RANDOM: 2115.2 mg/L
BKR ALBUMIN/CREATININE RATIO, URINE, RANDOM: 1119.7 mg/g{creat} — ABNORMAL HIGH (ref <30.0)
BKR CREATININE, URINE, RANDOM: 189 mg/dL

## 2022-10-25 LAB — PROTEIN, TOTAL W/CREATININE, URINE, RANDOM     (BH GH LMW YH)
BKR CREATININE, URINE, RANDOM: 189 mg/dL
BKR PROTEIN URINE RANDOM: 2.76 g/L
BKR PROTEIN/CREATININE RATIO, URINE, RANDOM: 1.46 mg/mg Cr — ABNORMAL HIGH (ref <30.0)

## 2022-10-25 LAB — TACROLIMUS LEVEL     (BH GH L LMW YH): BKR TACROLIMUS BLOOD: 6.4 ng/mL

## 2022-10-25 LAB — MAGNESIUM: BKR MAGNESIUM: 1.7 mg/dL (ref 1.7–2.4)

## 2022-10-25 NOTE — Telephone Encounter
-----   Message from Josetta Huddle, MD sent at 10/25/2022  4:18 PM EDT -----LDL cholesterol of 45 is at goal.  Triglycerides remain mildly elevated to 21.  Continued efforts at dietary modification

## 2022-10-25 NOTE — Telephone Encounter
Called and spoke with pt, pt unable to fully understand the message, called back with interpretor ID # [phone] The patient was notified of results and verbalized understanding. Encouraged a mediterranean diet.

## 2022-10-25 NOTE — Other
LDL cholesterol of 45 is at goal.  Triglycerides remain mildly elevated to 21.  Continued efforts at dietary modification

## 2022-10-28 ENCOUNTER — Inpatient Hospital Stay
Admit: 2022-10-28 | Discharge: 2022-10-28 | Payer: BLUE CROSS/BLUE SHIELD | Attending: Student in an Organized Health Care Education/Training Program

## 2022-10-28 ENCOUNTER — Emergency Department: Admit: 2022-10-28 | Payer: BLUE CROSS/BLUE SHIELD | Attending: Diagnostic Radiology | Primary: Internal Medicine

## 2022-10-28 ENCOUNTER — Emergency Department: Admit: 2022-10-28 | Payer: BLUE CROSS/BLUE SHIELD | Primary: Internal Medicine

## 2022-10-28 DIAGNOSIS — R519 Headache, unspecified: Secondary | ICD-10-CM

## 2022-10-28 DIAGNOSIS — H5789 Other specified disorders of eye and adnexa: Secondary | ICD-10-CM

## 2022-10-28 DIAGNOSIS — E119 Type 2 diabetes mellitus without complications: Secondary | ICD-10-CM

## 2022-10-28 DIAGNOSIS — I10 Essential (primary) hypertension: Secondary | ICD-10-CM

## 2022-10-28 DIAGNOSIS — H43813 Vitreous degeneration, bilateral: Secondary | ICD-10-CM

## 2022-10-28 LAB — C-REACTIVE PROTEIN     (CRP): BKR C-REACTIVE PROTEIN, HIGH SENSITIVITY: 1.3 mg/L

## 2022-10-28 LAB — CBC WITH AUTO DIFFERENTIAL
BKR WAM ABSOLUTE IMMATURE GRANULOCYTES.: 0.03 x 1000/ÂµL (ref 0.00–0.30)
BKR WAM ABSOLUTE LYMPHOCYTE COUNT.: 0.83 x 1000/ÂµL (ref 0.60–3.70)
BKR WAM ABSOLUTE NRBC (2 DEC): 0 x 1000/ÂµL (ref 0.00–1.00)
BKR WAM ANC (ABSOLUTE NEUTROPHIL COUNT): 4 x 1000/ÂµL (ref 2.00–7.60)
BKR WAM BASOPHIL ABSOLUTE COUNT.: 0.03 x 1000/ÂµL (ref 0.00–1.00)
BKR WAM BASOPHILS: 0.6 % (ref 0.0–1.4)
BKR WAM EOSINOPHIL ABSOLUTE COUNT.: 0.08 x 1000/ÂµL (ref 0.00–1.00)
BKR WAM EOSINOPHILS: 1.5 % (ref 0.0–5.0)
BKR WAM HEMATOCRIT (2 DEC): 43.9 % (ref 38.50–50.00)
BKR WAM HEMOGLOBIN: 14.2 g/dL (ref 13.2–17.1)
BKR WAM IMMATURE GRANULOCYTES: 0.6 % (ref 0.0–1.0)
BKR WAM LYMPHOCYTES: 15.3 % — ABNORMAL LOW (ref 17.0–50.0)
BKR WAM MCH (PG): 30.9 pg (ref 27.0–33.0)
BKR WAM MCHC: 32.3 g/dL (ref 31.0–36.0)
BKR WAM MCV: 95.6 fL (ref 80.0–100.0)
BKR WAM MONOCYTE ABSOLUTE COUNT.: 0.44 x 1000/ÂµL (ref 0.00–1.00)
BKR WAM MONOCYTES: 8.1 % (ref 4.0–12.0)
BKR WAM MPV: 10.1 fL (ref 8.0–12.0)
BKR WAM NEUTROPHILS: 73.9 % — ABNORMAL HIGH (ref 39.0–72.0)
BKR WAM NUCLEATED RED BLOOD CELLS: 0 % (ref 0.0–1.0)
BKR WAM PLATELETS: 192 x1000/ÂµL (ref 150–420)
BKR WAM RDW-CV: 13.6 % (ref 11.0–15.0)
BKR WAM RED BLOOD CELL COUNT.: 4.59 M/ÂµL (ref 4.00–6.00)
BKR WAM WHITE BLOOD CELL COUNT: 5.4 x1000/ÂµL (ref 4.0–11.0)

## 2022-10-28 LAB — BASIC METABOLIC PANEL
BKR ANION GAP: 14 (ref 7–17)
BKR BLOOD UREA NITROGEN: 17 mg/dL (ref 6–20)
BKR BUN / CREAT RATIO: 10.8 (ref 8.0–23.0)
BKR CALCIUM: 8.7 mg/dL — ABNORMAL LOW (ref 8.8–10.2)
BKR CHLORIDE: 102 mmol/L (ref 98–107)
BKR CO2: 26 mmol/L (ref 20–30)
BKR CREATININE: 1.58 mg/dL — ABNORMAL HIGH (ref 0.40–1.30)
BKR EGFR, CREATININE (CKD-EPI 2021): 50 mL/min/{1.73_m2} — ABNORMAL LOW (ref >=60)
BKR GLUCOSE: 123 mg/dL — ABNORMAL HIGH (ref 70–100)
BKR POTASSIUM: 3.9 mmol/L (ref 3.3–5.3)
BKR SODIUM: 142 mmol/L (ref 136–144)

## 2022-10-28 LAB — SEDIMENTATION RATE (ESR): BKR SEDIMENTATION RATE, ERYTHROCYTE: 24 mm/h — ABNORMAL HIGH (ref 0–20)

## 2022-10-28 LAB — MAGNESIUM: BKR MAGNESIUM: 1.6 mg/dL — ABNORMAL LOW (ref 1.7–2.4)

## 2022-10-28 MED ORDER — MAGNESIUM OXIDE 400 MG (241.3 MG MAGNESIUM) TABLET
400 | Freq: Once | ORAL | Status: CP
Start: 2022-10-28 — End: ?
  Administered 2022-10-28: 17:00:00 400 mg (241.3 mg magnesium) via ORAL

## 2022-10-28 MED ORDER — ACETAMINOPHEN 325 MG TABLET
325 | Freq: Once | ORAL | Status: CP
Start: 2022-10-28 — End: ?
  Administered 2022-10-28: 17:00:00 325 mg via ORAL

## 2022-10-28 MED ORDER — IOHEXOL 350 MG IODINE/ML INTRAVENOUS SOLUTION
350 | Freq: Once | INTRAVENOUS | Status: CP | PRN
Start: 2022-10-28 — End: ?
  Administered 2022-10-28: 18:00:00 350 mL via INTRAVENOUS

## 2022-10-28 MED ORDER — SODIUM CHLORIDE 0.9 % LARGE VOLUME SYRINGE FOR AUTOINJECTOR
Freq: Once | INTRAVENOUS | Status: CP | PRN
Start: 2022-10-28 — End: ?
  Administered 2022-10-28: 18:00:00 via INTRAVENOUS

## 2022-10-28 NOTE — ED Notes
11:51 AM Chief Complaint Patient presents with  Eye Problem   Patient presenting complaints of seeing floaters. Patient reporting hypertension at home. Kidney transplant 3 years ago. Slurred speech reported as baseline. + headache. Ambulatory. BP 215/99 in triage Pt arrived to the ED with c/o Floaters in R eye.  Started 20 minutes before arriving to ED.  Pt had kidney transplant 3 years ago.  Pt was HTN at triage - 215 systolic.  Pt has .slurred speech, which is baseline. Pt has hx of HTN, hyperparathyroidism,  Pt alert and oriented x 4. Pt able to talk in complete sentences without difficulty. Pt denies SOB/CP, N/V/D. Pt denies dizziness.  Pt denies fever/chills. Pt denies urinary symptoms. 12:27 PMPt compliant with provider review of R eye. Pt compliant with IV placement by provider. Pt compliant with blood draw. 1:47 PMPt resting comfortably.   Pt wife - Melissa - at bedside.4:11 PMPt 178/87 BP - shared to provider. They are aware of pt hx of HTN, and iterated that patient should be reminded to be vigilant in tanking HTN medication.

## 2022-10-28 NOTE — Discharge Instructions
Sus an?lisis de [place] e im?genes de [employer]. Los oftalm?logos creen que es posible que tengas un peque?o desprendimiento de v?treo que no necesita tratamiento ni m?s trabajo. Contin?e haciendo un seguimiento de su m?dico de atenci?n primaria y, si sus s?ntomas empeoran, regrese al hospital.

## 2022-10-29 ENCOUNTER — Telehealth: Admit: 2022-10-29 | Payer: PRIVATE HEALTH INSURANCE | Primary: Internal Medicine

## 2022-10-29 NOTE — Other
[facility] Ophthalmology Consult NoteConsult Information: Date of Admission: 10/28/2022 Date of Service: August 15, 2024Consult Requested By: ED, Donald Siva, MD Reason for consult: Floatersin the RIGHT eyeThe patient was not personally examined. The chart was reviewed and discussed with the resident who evaluated the patient.  Findings and recommendations as outlined by the resident.   I agree with the findings and recommendations outlined below with the following additions:PVD both eyesDiabetes mellitus without retinopathy - can follow up with community optometry in the future.Hollice Espy, MD8/16/20248:28 AMImpression/Recommendations #Posterior vitreous detachment BOTH eyes-Patient with new floater that started today, not always there and moves around and looks like a ring. -VA intact, no flashes, no curtains/veils-exam without tears or detachments, -shafers, + weiss ring on both eyesDiabetes mellitus without retinopathy both eyesInpatient/ED Recommendations:No interventions requiredOutpatient Recommendations:Please call [facility] for follow up regular annual examsDisposition Recommendations: Cleared for discharge from ophthalmological perspectiveFollow up with own eye doctorWe will sign off.  Please reconsult if needed.Subjective HPI: Adam Harper is a 62 y.o.M with  no ophthalmological history  who presented on October 28, 2022 with a new floater that he noticed today.Patient states that he woke up and noticed one large floater in the RIGHT eye. It comes and goes and is more noticeable in bright lights.Endorses: floatersDenies: blurry vision, eye pain, diplopia, loss of color vision, flashes, redness, discharge, tearing, and photosensitivityAdditional History: Eye care provider:  YECLast eye exam:  2022Past ocular history: no surgery, laser, or glassesPast medical history: as aboveOcular meds: Reviewed.  Medications: Reviewed.  Family history: non-contributoryAllergies: No Known Allergies Objective Ophthalmologic Exam: Vitals reviewed.  Vitals:  10/28/22 1527 BP: (!) 152/88 Pulse: 71 Resp: 18 Temp: 97.4 ?F (36.3 ?C) General / External Exam Comfortable and in no acute distress. Appears appropriate for age externally.  Vision / Pressure / Pupils Right Left Vision: Near SC 20/25 20/25 IOP (mmHg) (Tonopen) 14 15 Pupils 4->2, brisk, no RAPD 4->2, brisk Functional Exam Right Left Motility Full Full Confrontational Visual Fields Full Full Color Plates Harlan Stains) 11/11 11/11 Anterior Exam via Slit Lamp Right Left Lids and Lashes Smooth and well positioned Smooth and well positioned Conjunctiva and Sclera White and quiet White and quiet Cornea Clear Clear Anterior chamber Deep and quiet Deep and quiet Iris Flat Flat Lens Clear, phakic Clear, phakic [x] Both eyes [] Right eye [] Left eye dilated with Phenylephrine 2.5% and Tropicamide 1%Dilated Fundus Exam (DFE) Right Left Vitreous Clear, shafers negative, +weiss ring Clear, shafers negative, + weiss ring Disc 0.2 cup to disc, sharp, pink and flat 0.2 cup to disc, sharp, pink and flat Macula Flat Flat Vessels Normal caliber and distribution Normal caliber and distribution Periphery Flat; no tears, detachments, whitening, commotio Flat; no tears, detachments, whitening, commotio Review of Labs/Diagnostics: Labs reviewed.  Recent Results (from the past 24 hour(s)) Magnesium  Collection Time: 10/28/22 12:19 PM Result Value Ref Range  Magnesium 1.6 (L) 1.7 - 2.4 mg/dL CBC auto differential  Collection Time: 10/28/22 12:19 PM Result Value Ref Range  WBC 5.4 4.0 - 11.0 x1000/?L  RBC 4.59 4.00 - 6.00 M/?L  Hemoglobin 14.2 13.2 - 17.1 g/dL  Hematocrit 10.27 25.36 - 50.00 %  MCV 95.6 80.0 - 100.0 fL  MCH 30.9 27.0 - 33.0 pg  MCHC 32.3 31.0 - 36.0 g/dL  RDW-CV 64.4 03.4 - 74.2 %  Platelets 192 150 - 420 x1000/?L  MPV 10.1 8.0 - 12.0 fL  Neutrophils 73.9 (H) 39.0 - 72.0 %  Lymphocytes 15.3 (L) 17.0 - 50.0 %  Monocytes 8.1 4.0 - 12.0 %  Eosinophils 1.5 0.0 - 5.0 %  Basophil 0.6 0.0 - 1.4 %  Immature Granulocytes 0.6 0.0 - 1.0 %  nRBC 0.0 0.0 - 1.0 %  Absolute Lymphocyte Count 0.83 0.60 - 3.70 x 1000/?L  Monocyte Absolute Count 0.44 0.00 - 1.00 x 1000/?L  Eosinophil Absolute Count 0.08 0.00 - 1.00 x 1000/?L  Basophil Absolute Count 0.03 0.00 - 1.00 x 1000/?L  Absolute Immature Granulocyte Count 0.03 0.00 - 0.30 x 1000/?L  Absolute nRBC 0.00 0.00 - 1.00 x 1000/?L  ANC (Abs Neutrophil Count) 4.00 2.00 - 7.60 x 1000/?L Basic metabolic panel  Collection Time: 10/28/22 12:19 PM Result Value Ref Range  Sodium 142 136 - 144 mmol/L  Potassium 3.9 3.3 - 5.3 mmol/L  Chloride 102 98 - 107 mmol/L  CO2 26 20 - 30 mmol/L  Anion Gap 14 7 - 17  Glucose 123 (H) 70 - 100 mg/dL  BUN 17 6 - 20 mg/dL  Creatinine 2.70 (H) 6.23 - 1.30 mg/dL  Calcium 8.7 (L) 8.8 - 10.2 mg/dL  BUN/Creatinine Ratio 76.2 8.0 - 23.0  eGFR (Creatinine) 50 (L) >=60 mL/min/1.69m2 C-reactive protein (CRP)  Collection Time: 10/28/22 12:19 PM Result Value Ref Range  CRP, High Sensitivity 1.3 See comment mg/L Sedimentation rate (ESR)  Collection Time: 10/28/22 12:19 PM Result Value Ref Range  Sedimentation Rate (ESR) 24 (H) 0 - 20 mm/hr Imaging reviewed.CTA Head and Neck (Not Stroke Code)Result Date: 8/15/2024CT head: No acute intracranial abnormality. 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 No dissection, or pseudoaneurysm. CTA head demonstrates no vessel cut off, significant stenosis or saccular aneurysm. [facility] Radiology Notify System Classification: Routine. Report initiated by:  Margaretmary Dys, MD Reported and signed by: Leroy Libman, MD  [facility]   Please contact ophthalmology pager with any additional questions. Signed:Eric Song MD PhDOphthalmology PGY-3August 15, 2024

## 2022-10-29 NOTE — Telephone Encounter
Spanish Interpreter ID: [phone] Pt seen in ED yesterday for HTN. No meds changed. Notable proteinuria on last labs. Plan reviewed with Dr. Smith Robert. - Increase losartan 100 mg PO QD- Keep BP log- Repeat labs with urine studies in 1 week.Call placed to patient. Spoke with pt and spouse in preferred language. Updated on above plan and all questions answered.

## 2022-10-30 NOTE — ED Provider Notes
Chief Complaint Patient presents with  Eye Problem   Patient presenting complaints of seeing floaters. Patient reporting hypertension at home. Kidney transplant 3 years ago. Slurred speech reported as baseline. + headache. Ambulatory. BP 215/99 in triage MDM --------------------------------------------------------------------------------------------------------------------------------Medical Decision Making:Pt is a 62 y.o. male with a history of hypertension, diabetes, end-stage renal disease previously on hemodialysis now s/p renal transplant (2021) who presents to the ED with a 1 day history of a mild right-sided headache and right monocular visual disturbance.  Patient reports that he awoke this morning and noticed straight lines in his right visual fields.  He describes them almost like floaters.  He denies a curtain coming down like appearance.  He also denies ocular pain, numbness, weakness, and/or paresthesias.  Patient noted to be hypertensive on arrival.  States that he has been having issues controlling his blood pressure ever since his primary care doctor made changes to his antihypertensives.Physical exam:Blood pressure (!) 152/88, pulse 71, temperature 97.4 ?F (36.3 ?C), resp. rate 18, SpO2 94 %. Alert and oriented.  In no acute distress.  Cranial nerves intact.  Pupils equal and reactive.  Extraocular movements intact.  Heart sounds normal.  Lungs are clear to auscultation bilaterally.  Previous AV fistula in right upper extremity.  Abdomen is soft nontender.  Strength and sensation intact throughout.  Ambulates with steady gait.  Has mildly slurred speech at baseline.Plan:Basic labs, ED ocular ultrasoundCTA head and neckOphthalmology consultTylenol 975 mg p.o.Differential diagnoses:Retinal detachmentVitreal detachmentRetinal vein/artery pathologyDiabetic retinopathyMigraineED course: My bedside ocular ultrasound of the right eye shows normal pathology.  No abnormal arterial with the globe.  Optic nerve appears normal.  No signs of retinal or vitreous detachment.CTA head and neck without significant abnormalities.  Labs also without significant abnormality.  Ophthalmology performed a posterior eye exam and they believe that the patient might have a small vitreal detachment that does not require further workup or management.  Okay to be discharged with follow up with primary care doctor.Evaluated with Dr. Lura Em addendum to follow.[place]a Jansky, MDEmergency Medicine Resident PhysicianAvailable through [facility] I-------------------------------------------------------------------------------------------------------------------------------- Physical ExamED Triage Vitals [10/28/22 1144]BP: (!) 215/99Pulse: 77Pulse from  O2 sat: n/aResp: 20Temp: 98.5 ?F (36.9 ?C)Temp src: OralSpO2: 94 % BP (!) 152/88  - Pulse 71  - Temp 97.4 ?F (36.3 ?C)  - Resp 18  - SpO2 94% Physical Exam ProceduresAttestation/Critical CarePatient Reevaluation: Attending Supervised: ResidentI saw and examined the patient. I agree with the findings and plan of care as documented in the resident's note except as noted below. Additional acute and/or chronic problems addressed: This is a 62 year old with hypertension, diabetes, end-stage renal disease previously on hemodialysis now s/p renal transplant presenting to the ER with floaters to right eye. No obvious retinal detachment on bedside US, CTA is reassuring, no large vessel occlusion. Pt was seen by ophthalmology- found to have vitreous detachment bilaterally, given recs for close follow up with outpatient ophthalmology. Pt was found to be hypertensive prior to arrival has known resistant hypertension.  Arthurine Oleary KadakiaClinical Impressions as of 10/28/22 1550 Acute nonintractable headache, unspecified headache type Monocular visual disturbance  ED DispositionDischarge Lodema Hong, MDResident08/15/24 1550 Donald Siva, MD08/15/24 1614 Donald Siva, MD08/17/24 1055

## 2022-11-02 ENCOUNTER — Inpatient Hospital Stay: Admit: 2022-11-02 | Discharge: 2022-11-02 | Payer: BLUE CROSS/BLUE SHIELD | Primary: Internal Medicine

## 2022-11-02 DIAGNOSIS — I10 Essential (primary) hypertension: Secondary | ICD-10-CM

## 2022-11-02 DIAGNOSIS — Z940 Kidney transplant status: Secondary | ICD-10-CM

## 2022-11-02 DIAGNOSIS — E781 Pure hyperglyceridemia: Secondary | ICD-10-CM

## 2022-11-02 DIAGNOSIS — E785 Hyperlipidemia, unspecified: Secondary | ICD-10-CM

## 2022-11-03 ENCOUNTER — Telehealth: Admit: 2022-11-03 | Payer: PRIVATE HEALTH INSURANCE | Attending: Cardiovascular Disease | Primary: Internal Medicine

## 2022-11-03 ENCOUNTER — Telehealth: Admit: 2022-11-03 | Payer: PRIVATE HEALTH INSURANCE | Attending: Ophthalmology | Primary: Internal Medicine

## 2022-11-03 NOTE — Telephone Encounter
Can nursing contact patient to further triage?Appointment Request From: Adam Harper  With Provider: Arby Barrette, MD [[facility] [address]]  Preferred Date Range: Any  Preferred Times: Any Time  Reason for visit: I see something in my right eye  Comments: I see something in my right eye

## 2022-11-03 NOTE — Other
Slightly dilated aortic root with maximal dimensions of 3.9 cm.  We will follow.  I will likely plan an echocardiogram in 2-3 years

## 2022-11-03 NOTE — Telephone Encounter
TC to patient utilizing interpreter. LVM for call back. ----- Message from Josetta Huddle, MD sent at 11/03/2022  3:09 PM EDT -----Slightly dilated aortic root with maximal dimensions of 3.9 cm.  We will follow.  I will likely plan an echocardiogram in 2-3 years

## 2022-11-04 NOTE — Telephone Encounter
Returned TC to patient for further triage, left voicemail to call back the office. Will attempt to call back patient.Thanks

## 2022-11-05 ENCOUNTER — Telehealth: Admit: 2022-11-05 | Payer: PRIVATE HEALTH INSURANCE | Attending: Cardiovascular Disease | Primary: Internal Medicine

## 2022-11-05 ENCOUNTER — Telehealth: Admit: 2022-11-05 | Payer: PRIVATE HEALTH INSURANCE | Primary: Internal Medicine

## 2022-11-05 NOTE — Telephone Encounter
Spanish Interpreter ID: [id number] pt to discuss BP log and repeat labs. Per pt, BP's have improved and provided readings of 127/62, 125/78, 137/69, 150/80, 109/86. Did take BP while on phone for value of 144/72. He reports he takes BP different times each day. Advised pt to continue monitoring BP at least daily and to repeat labs early next week. Will follow up with pt. Pt is agreeable to plan with teach-back.

## 2022-11-05 NOTE — Telephone Encounter
Hi [facility] Team,TC to [name], he reports OD constant black floaters x 7 days.He denies eye pain, redness, swelling, discharge, flashes or curtain / veil symptoms.He denies using any gtts or eye medications.He denies trauma.Please adviseThanks

## 2022-11-05 NOTE — Telephone Encounter
Duplicate encounter. See encounter from 8/21

## 2022-11-05 NOTE — Telephone Encounter
Hi [facility],Please see scheduling advise per Dr. Rana.Thanks

## 2022-11-05 NOTE — Telephone Encounter
The pt would like a call back from a nurse or a Dr. regarding his MRI results.

## 2022-11-05 NOTE — Telephone Encounter
Copied from CRM #[id number]. Topic: General Message - [facility]>> Nov 05, 2022  1:29 PM Danell Vazquez P wrote:[facility] MESSAGETime of call:   1:29 PMCaller:   Junker, Karol APatient returning call from the nurses.  Please advise. Best telephone number for callback:   [phone] Best time to return call:   anytime Permission to leave message:  yes   [facility] Referral Specialist

## 2022-11-05 NOTE — Telephone Encounter
Language Line Utilized: Interpreter ID [phone] Call to patient - no answer. VM left for return call.

## 2022-11-05 NOTE — Telephone Encounter
TC to patient utilizing interpreter service. Interpreter ID # [id number] MD message relayed in full. Patient verbalized understanding. No further questions at this time.

## 2022-11-08 ENCOUNTER — Encounter: Admit: 2022-11-08 | Payer: PRIVATE HEALTH INSURANCE | Primary: Internal Medicine

## 2022-11-08 MED FILL — TACROLIMUS XR 4 MG TABLET,EXTENDED RELEASE 24 HR: 4 mg | ORAL | 30 days supply | Qty: 60 | Fill #3 | Status: AC

## 2022-11-08 MED FILL — PREDNISONE 5 MG TABLET: 5 mg | ORAL | 30 days supply | Qty: 30 | Fill #4 | Status: AC

## 2022-11-08 MED FILL — TACROLIMUS XR 1 MG TABLET,EXTENDED RELEASE 24 HR: 1 mg | ORAL | 30 days supply | Qty: 30 | Fill #4 | Status: AC

## 2022-11-09 ENCOUNTER — Telehealth: Admit: 2022-11-09 | Payer: PRIVATE HEALTH INSURANCE | Primary: Internal Medicine

## 2022-11-09 NOTE — Telephone Encounter
Spanish Interpreter ID: [id number] Called pt regarding BP log and repeating labs. Unable to reach pt. Left detailed VM in preferred language.

## 2022-11-11 ENCOUNTER — Ambulatory Visit: Admit: 2022-11-11 | Payer: BLUE CROSS/BLUE SHIELD | Attending: Ophthalmology | Primary: Internal Medicine

## 2022-11-11 ENCOUNTER — Ambulatory Visit: Admit: 2022-11-11 | Payer: BLUE CROSS/BLUE SHIELD | Primary: Internal Medicine

## 2022-11-11 DIAGNOSIS — E119 Type 2 diabetes mellitus without complications: Secondary | ICD-10-CM

## 2022-11-11 NOTE — Progress Notes
Adam Harper is a 62 y.o. gentleman who presents as new patient. Currently not working.  Spanish interpreter: I am BCP certified therefore an interpreter was not used Patient Adam Harper has had COVID vaccination.  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 I agree with the impression as noted above.Loma Sousa, MD, PhDProfessor and ChairDepartment of Ophthalmology

## 2022-11-16 ENCOUNTER — Encounter: Admit: 2022-11-16 | Payer: PRIVATE HEALTH INSURANCE | Attending: Internal Medicine | Primary: Internal Medicine

## 2022-11-16 DIAGNOSIS — Z79899 Other long term (current) drug therapy: Secondary | ICD-10-CM

## 2022-11-17 ENCOUNTER — Telehealth: Admit: 2022-11-17 | Payer: PRIVATE HEALTH INSURANCE | Primary: Internal Medicine

## 2022-11-17 MED ORDER — MYCOPHENOLATE MOFETIL 250 MG CAPSULE
250 mg | ORAL_CAPSULE | Freq: Two times a day (BID) | ORAL | 12 refills | 40.00 days | Status: AC
Start: 2022-11-17 — End: ?
  Filled 2022-11-26: qty 240, 40d supply, fill #0

## 2022-11-17 NOTE — Telephone Encounter
Spanish Interpreter ID: [id number] Called pt to discuss BP log and labs. Pt states he will do labs tomorrow AM. Pt reports BP has been stable, but does not have BP readings on him at this time. Will follow up with results and for BP log.

## 2022-11-18 ENCOUNTER — Encounter: Admit: 2022-11-18 | Payer: PRIVATE HEALTH INSURANCE | Attending: Internal Medicine | Primary: Internal Medicine

## 2022-11-18 ENCOUNTER — Encounter: Admit: 2022-11-18 | Payer: PRIVATE HEALTH INSURANCE | Attending: Pulmonary Disease | Primary: Internal Medicine

## 2022-11-18 ENCOUNTER — Inpatient Hospital Stay: Admit: 2022-11-18 | Discharge: 2022-11-18 | Payer: BLUE CROSS/BLUE SHIELD | Primary: Internal Medicine

## 2022-11-18 ENCOUNTER — Telehealth: Admit: 2022-11-18 | Payer: PRIVATE HEALTH INSURANCE | Primary: Internal Medicine

## 2022-11-18 DIAGNOSIS — Z940 Kidney transplant status: Secondary | ICD-10-CM

## 2022-11-18 DIAGNOSIS — I10 Essential (primary) hypertension: Secondary | ICD-10-CM

## 2022-11-18 DIAGNOSIS — Z2989 Need for prophylactic immunotherapy: Secondary | ICD-10-CM

## 2022-11-18 DIAGNOSIS — Z7960 Long term (current) use of unspecified immunomodulators and immunosuppressants: Secondary | ICD-10-CM

## 2022-11-18 DIAGNOSIS — Z79899 Other long term (current) drug therapy: Secondary | ICD-10-CM

## 2022-11-18 DIAGNOSIS — E139 Other specified diabetes mellitus without complications: Secondary | ICD-10-CM

## 2022-11-18 LAB — BASIC METABOLIC PANEL
BKR ANION GAP: 13 g/dL (ref 7–17)
BKR BLOOD UREA NITROGEN: 13 mg/dL (ref 6–20)
BKR BUN / CREAT RATIO: 8.1 % (ref 8.0–23.0)
BKR CALCIUM: 8.9 mg/dL (ref 8.8–10.2)
BKR CHLORIDE: 100 mmol/L (ref 98–107)
BKR CO2: 27 mmol/L (ref 20–30)
BKR CREATININE: 1.6 mg/dL — ABNORMAL HIGH (ref 0.40–1.30)
BKR EGFR, CREATININE (CKD-EPI 2021): 49 mL/min/{1.73_m2} — ABNORMAL LOW (ref >=60–12.0)
BKR GLUCOSE: 102 mg/dL — ABNORMAL HIGH (ref 70–100)
BKR POTASSIUM: 3.9 mmol/L (ref 3.3–5.3)
BKR SODIUM: 140 mmol/L (ref 136–144)

## 2022-11-18 LAB — CBC WITH AUTO DIFFERENTIAL
BKR WAM ABSOLUTE IMMATURE GRANULOCYTES.: 0.03 x 1000/ÂµL (ref 0.00–0.30)
BKR WAM ABSOLUTE LYMPHOCYTE COUNT.: 1.06 x 1000/ÂµL (ref 0.60–3.70)
BKR WAM ABSOLUTE NRBC (2 DEC): 0 x 1000/ÂµL — ABNORMAL HIGH (ref 0.00–1.00)
BKR WAM ANC (ABSOLUTE NEUTROPHIL COUNT): 3.96 x 1000/ÂµL (ref 2.00–7.60)
BKR WAM BASOPHIL ABSOLUTE COUNT.: 0.03 x 1000/ÂµL (ref 0.00–1.00)
BKR WAM BASOPHILS: 0.5 % (ref 0.0–1.4)
BKR WAM EOSINOPHIL ABSOLUTE COUNT.: 0.12 x 1000/ÂµL (ref 0.00–1.00)
BKR WAM EOSINOPHILS: 2.1 % (ref 0.0–5.0)
BKR WAM HEMATOCRIT (2 DEC): 45 % (ref 38.50–50.00)
BKR WAM HEMOGLOBIN: 14.5 g/dL (ref 13.2–17.1)
BKR WAM IMMATURE GRANULOCYTES: 0.5 % — ABNORMAL HIGH (ref 0.0–1.0)
BKR WAM LYMPHOCYTES: 18.3 % (ref 17.0–50.0)
BKR WAM MCH (PG): 31.4 pg (ref 27.0–33.0)
BKR WAM MCHC: 32.2 g/dL (ref 31.0–36.0)
BKR WAM MCV: 97.4 fL (ref 80.0–100.0)
BKR WAM MONOCYTE ABSOLUTE COUNT.: 0.59 x 1000/ÂµL (ref 0.00–1.00)
BKR WAM MONOCYTES: 10.2 % (ref 4.0–12.0)
BKR WAM MPV: 10.6 fL — ABNORMAL HIGH (ref 8.0–12.0)
BKR WAM NEUTROPHILS: 68.4 % (ref 39.0–72.0)
BKR WAM NUCLEATED RED BLOOD CELLS: 0 % (ref 0.0–1.0)
BKR WAM PLATELETS: 195 x1000/ÂµL (ref 150–420)
BKR WAM RDW-CV: 13.6 % (ref 11.0–15.0)
BKR WAM RED BLOOD CELL COUNT.: 4.62 M/ÂµL (ref 4.00–6.00)
BKR WAM WHITE BLOOD CELL COUNT: 5.8 x1000/ÂµL (ref 4.0–11.0)

## 2022-11-18 LAB — LIPID PANEL
BKR CHOLESTEROL/HDL RATIO: 3.4 (ref 0.0–5.0)
BKR CHOLESTEROL: 113 mg/dL
BKR HDL CHOLESTEROL: 33 mg/dL — ABNORMAL LOW (ref >=40)
BKR LDL CHOLESTEROL SAMPSON CALCULATED: 44 mg/dL
BKR TRIGLYCERIDES: 230 mg/dL — ABNORMAL HIGH

## 2022-11-18 LAB — ALBUMIN/CREATININE PANEL, URINE, RANDOM
BKR ALBUMIN, URINE, RANDOM: 3616.4 mg/L
BKR ALBUMIN/CREATININE RATIO, URINE, RANDOM: 2168.1 mg/g{creat} — ABNORMAL HIGH (ref <30.0)
BKR CREATININE, URINE, RANDOM: 167 mg/dL

## 2022-11-18 LAB — MAGNESIUM: BKR MAGNESIUM: 1.6 mg/dL — ABNORMAL LOW (ref 1.7–2.4)

## 2022-11-18 LAB — PROTEIN, TOTAL W/CREATININE, URINE, RANDOM     (BH GH LMW YH)
BKR CREATININE, URINE, RANDOM: 167 mg/dL
BKR PROTEIN URINE RANDOM: 4.4 g/L
BKR PROTEIN/CREATININE RATIO, URINE, RANDOM: 2.64 mg/mg Cr — ABNORMAL HIGH (ref <30.0)

## 2022-11-18 LAB — PHOSPHORUS     (BH GH L LMW YH): BKR PHOSPHORUS: 4.4 mg/dL (ref 2.2–4.5)

## 2022-11-18 MED FILL — LOSARTAN 50 MG TABLET: 50 mg | ORAL | 90 days supply | Qty: 90 | Fill #1 | Status: CP

## 2022-11-18 NOTE — Telephone Encounter
Spanish Interpreter ID: [id number] Labs reviewed with Dr. Smith Robert. Will have pt continue losartan 100 mg po QD and keep BP log. If urine protein remains elevated at next visit on 9/24, will plan for biopsy. Called pt and discussed plan. All questions answered. He did not have BP log on him, however he reports he will notify team if SBP > 150.

## 2022-11-19 MED ORDER — CARVEDILOL IMMEDIATE RELEASE 25 MG TABLET
25 mg | ORAL_TABLET | Freq: Two times a day (BID) | ORAL | 12 refills | 30.00 days | Status: AC
Start: 2022-11-19 — End: ?
  Filled 2022-11-26: qty 60, 30d supply, fill #0

## 2022-11-26 MED FILL — PREDNISONE 5 MG TABLET: 5 mg | ORAL | 30 days supply | Qty: 30 | Fill #5 | Status: CP

## 2022-11-26 MED FILL — TACROLIMUS XR 1 MG TABLET,EXTENDED RELEASE 24 HR: 1 mg | ORAL | 30 days supply | Qty: 30 | Fill #5 | Status: CP

## 2022-11-26 MED FILL — TACROLIMUS XR 4 MG TABLET,EXTENDED RELEASE 24 HR: 4 mg | ORAL | 30 days supply | Qty: 60 | Fill #4 | Status: CP

## 2022-12-04 ENCOUNTER — Inpatient Hospital Stay: Admit: 2022-12-04 | Discharge: 2022-12-04 | Payer: BLUE CROSS/BLUE SHIELD | Primary: Internal Medicine

## 2022-12-04 DIAGNOSIS — Z79899 Other long term (current) drug therapy: Secondary | ICD-10-CM

## 2022-12-04 DIAGNOSIS — Z940 Kidney transplant status: Secondary | ICD-10-CM

## 2022-12-04 LAB — PROTEIN, TOTAL W/CREATININE, URINE, RANDOM     (BH GH LMW YH)
BKR CREATININE, URINE, RANDOM: 440 mg/dL
BKR PROTEIN URINE RANDOM: 11.23 g/L — ABNORMAL HIGH (ref 70–100)
BKR PROTEIN/CREATININE RATIO, URINE, RANDOM: 2.55 mg/mg{creat} — ABNORMAL HIGH (ref <0.10)
BKR WAM PLATELETS: 440 mg/dL (ref 6–20)

## 2022-12-04 LAB — CBC WITH AUTO DIFFERENTIAL
BKR WAM ABSOLUTE IMMATURE GRANULOCYTES.: 0.03 x 1000/ÂµL (ref 0.00–0.30)
BKR WAM ABSOLUTE LYMPHOCYTE COUNT.: 0.76 x 1000/ÂµL (ref 0.60–3.70)
BKR WAM ABSOLUTE NRBC (2 DEC): 0 x 1000/ÂµL (ref 0.00–1.00)
BKR WAM ANC (ABSOLUTE NEUTROPHIL COUNT): 5.28 x 1000/ÂµL (ref 2.00–7.60)
BKR WAM BASOPHIL ABSOLUTE COUNT.: 0.03 x 1000/ÂµL (ref 0.00–1.00)
BKR WAM BASOPHILS: 0.5 % (ref 0.0–1.4)
BKR WAM EOSINOPHIL ABSOLUTE COUNT.: 0.08 x 1000/ÂµL (ref 0.00–1.00)
BKR WAM EOSINOPHILS: 1.2 % (ref 0.0–5.0)
BKR WAM HEMATOCRIT (2 DEC): 46.3 % (ref 38.50–50.00)
BKR WAM HEMOGLOBIN: 15 g/dL (ref 13.2–17.1)
BKR WAM IMMATURE GRANULOCYTES: 0.5 % (ref 0.0–1.0)
BKR WAM LYMPHOCYTES: 11.4 % — ABNORMAL LOW (ref 17.0–50.0)
BKR WAM MCH (PG): 31.6 pg (ref 27.0–33.0)
BKR WAM MCHC: 32.4 g/dL (ref 31.0–36.0)
BKR WAM MCV: 97.5 fL (ref 80.0–100.0)
BKR WAM MONOCYTE ABSOLUTE COUNT.: 0.47 x 1000/ÂµL (ref 0.00–1.00)
BKR WAM MPV: 10.4 fL (ref 8.0–12.0)
BKR WAM NEUTROPHILS: 79.3 % — ABNORMAL HIGH (ref 39.0–72.0)
BKR WAM NUCLEATED RED BLOOD CELLS: 0 % (ref 0.0–1.0)
BKR WAM RDW-CV: 13.6 % (ref 11.0–15.0)
BKR WAM RED BLOOD CELL COUNT.: 4.75 M/ÂµL (ref 4.00–6.00)
BKR WAM WHITE BLOOD CELL COUNT: 6.7 x1000/ÂµL (ref 4.0–11.0)

## 2022-12-04 LAB — ALBUMIN/CREATININE PANEL, URINE, RANDOM
BKR ALBUMIN, URINE, RANDOM: >4400 mg/L
BKR CREATININE, URINE, RANDOM: 440 mg/dL

## 2022-12-04 LAB — MAGNESIUM: BKR MAGNESIUM: 1.5 mg/dL — ABNORMAL LOW (ref 1.7–2.4)

## 2022-12-04 LAB — BASIC METABOLIC PANEL
BKR ANION GAP: 14 (ref 7–17)
BKR BLOOD UREA NITROGEN: 11 mg/dL (ref 6–20)
BKR BUN / CREAT RATIO: 6.4 — ABNORMAL LOW (ref 8.0–23.0)
BKR CALCIUM: 8.4 mg/dL — ABNORMAL LOW (ref 8.8–10.2)
BKR CHLORIDE: 100 mmol/L (ref 98–107)
BKR CO2: 27 mmol/L (ref 20–30)
BKR CREATININE: 1.71 mg/dL — ABNORMAL HIGH (ref 0.40–1.30)
BKR EGFR, CREATININE (CKD-EPI 2021): 45 mL/min/{1.73_m2} — ABNORMAL LOW (ref >=60)
BKR GLUCOSE: 130 mg/dL — ABNORMAL HIGH (ref 70–100)
BKR POTASSIUM: 3.4 mmol/L (ref 3.3–5.3)
BKR SODIUM: 141 mmol/L (ref 136–144)
BKR WAM MONOCYTES: 45 mL/min/1.73m2 — ABNORMAL LOW (ref >=60–12.0)

## 2022-12-04 LAB — PHOSPHORUS     (BH GH L LMW YH): BKR PHOSPHORUS: 3.6 mg/dL (ref 2.2–4.5)

## 2022-12-05 LAB — TACROLIMUS LEVEL     (BH GH L LMW YH): BKR TACROLIMUS BLOOD: 12.1 ng/mL

## 2022-12-07 ENCOUNTER — Inpatient Hospital Stay: Admit: 2022-12-07 | Discharge: 2022-12-07 | Payer: BLUE CROSS/BLUE SHIELD | Primary: Internal Medicine

## 2022-12-07 ENCOUNTER — Encounter: Admit: 2022-12-07 | Payer: PRIVATE HEALTH INSURANCE | Primary: Internal Medicine

## 2022-12-07 ENCOUNTER — Telehealth: Admit: 2022-12-07 | Payer: PRIVATE HEALTH INSURANCE | Primary: Internal Medicine

## 2022-12-07 ENCOUNTER — Ambulatory Visit: Admit: 2022-12-07 | Payer: BLUE CROSS/BLUE SHIELD | Attending: Internal Medicine | Primary: Internal Medicine

## 2022-12-07 ENCOUNTER — Ambulatory Visit: Admit: 2022-12-07 | Payer: BLUE CROSS/BLUE SHIELD | Primary: Internal Medicine

## 2022-12-07 DIAGNOSIS — Z79899 Other long term (current) drug therapy: Secondary | ICD-10-CM

## 2022-12-07 DIAGNOSIS — R809 Proteinuria, unspecified: Secondary | ICD-10-CM

## 2022-12-07 DIAGNOSIS — I10 Essential (primary) hypertension: Secondary | ICD-10-CM

## 2022-12-07 DIAGNOSIS — Z940 Kidney transplant status: Secondary | ICD-10-CM

## 2022-12-07 LAB — HLA POST TRANSPLANT DSA (YMG)
HLA CLASS I ANTIBODY TEST RESULT (S.A.): NEGATIVE
HLA CLASS II ANTIBODY TEST RESULT (S.A.): NEGATIVE mmol/L (ref 3.3–5.3)

## 2022-12-07 LAB — TACROLIMUS LEVEL     (BH GH L LMW YH): BKR TACROLIMUS BLOOD: 8.1 ng/mL — ABNORMAL LOW (ref 8.0–23.0)

## 2022-12-07 MED ORDER — AMLODIPINE 5 MG TABLET
5 mg | ORAL_TABLET | Freq: Every day | ORAL | 12 refills | 30.00 days | Status: AC
Start: 2022-12-07 — End: ?
  Filled 2022-12-08: qty 30, 30d supply, fill #0

## 2022-12-07 NOTE — Progress Notes
 MD unavailable, verbal obtained from [name] Huntsman APP, orders placed.

## 2022-12-07 NOTE — Telephone Encounter
 I spoke with patient to schedule a biopsy due to proteinuria and he agreed to Monday, September 30th at 10:00 am. I explained to him that he needs to go to lab first and then come up to the 4th floor for biopsy. Patient agreed with plan.

## 2022-12-07 NOTE — Progress Notes
 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 Bactrim switched to AtovaquoneIntermittent pre-syncopal events, holter monitoring.Stent removal 9/15/20213/22: Biopsy: ACUTE CELLULAR REJECTION, BANFF TYPE IA 10/22: Biopsy: ACUTE TUBULAR INJURY, FOCALLast updated : 12/13/19 , Stanford Scotland, MD History of present illness: Adam Harper does not report symptoms referable to his transplanted kidney.He  reports compliance with his immunosuppressive medications and does not report drug related side effects.Adam Harper reports blood pressure at home is has been elevated. He has been using two different cuffs and getting different blood pressures. Advised patein to use arm cuff instead of wrist cuff. Patient brought his home cuff in office with a BP of 147/90.He denies specifically, fevers, diarrhea, dysuria, urinary urgency and pain over the allograft.________PFSH reviewed and confirmed 9/24/2024Review of Systems Patient denies any fevers or chills. No significant weight gain or weight loss.No headaches, neck stiffness, no vision changes, no ear or nose discharge and no oral lesions.No chest pain/ chest tightness, no cough/sputum or shortness of breath. No abdominal pain, N/V/D or constipationNo urinary complaints i.e. dysuria, hematuria, cloudy urine, reduced output. No skin rashes, no joint effusions.  No recent hospitalization, major illness. No recent h/o cancer. Medications Antihypertensives     Start End  carvediloL (COREG) 25 mg Immediate Release tablet 11/19/2022 11/19/2023  Sig - Route: Take 1 tablet (25 mg total) by mouth 2 (two) times daily with breakfast and dinner. - Oral  losartan (COZAAR) 50 mg tablet 08/31/2022 --  Sig - Route: Take 1 tablet (50 mg total) by mouth daily. - Oral  Immunosuppression     Start End  mycophenolate mofetil (CELLCEPT) 250 mg capsule 11/17/2022 --  Sig - Route: Take 3 capsules (750 mg total) by mouth 2 (two) times daily. - Oral  Notes to Pharmacy: Kidney Transplant Z94.0.  No prior authorization was found for this prescription.  Found prior authorization for another prescription for the same medication: Approved  predniSONE (DELTASONE) 5 mg tablet 06/21/2022 --  Sig - Route: Take 1 tablet (5 mg total) by mouth daily. - Oral  Notes to Pharmacy: Kidney Transplant Z94.0  tacrolimus XR (ENVARSUS XR) 1 mg 24 hr tablet 07/13/2022 07/13/2023  Sig - Route: Take 1 tablet (1 mg total) by mouth daily. Take with 4 mg tabs for total dose 9 mg daily - Oral  Notes to Pharmacy: Kidney txp date: 10/16/2019  DX Code: Z94.0  Prior authorization: Approved  tacrolimus XR (ENVARSUS XR) 4 mg 24 hr tablet 07/08/2022 --  Sig - Route: Take 2 tablets (8 mg total) by mouth daily. Take with 1 mg tabs for total dose 9 mg daily - Oral  Prior authorization: Approved  Transplant Prophylaxis   None  Physical Exam   Vitals: BP Readings from Last 2 Encounters: 12/07/22 (!) 173/90 10/28/22 (!) 178/87 Wt Readings from Last 4 Encounters: 12/07/22 125.1 kg 10/12/22 125.2 kg 09/03/22 123.4 kg 08/31/22 123.4 kg GENERAL : Alert and oriented, appears well, fully conversantHEENT : EOMI, MMMRESPIRATORY : no increased work of breathing on room airEXTREMITIES : No edemaLabs Lab Results Component Value Date  CREATININE 1.71 (H) 12/04/2022  BUN 11 12/04/2022  NA 141 12/04/2022  K 3.4 12/04/2022  CL 100 12/04/2022  CO2 27 12/04/2022  Lab Results Component Value Date  MG 1.5 (L) 12/04/2022 Lab Results Component Value Date  CALCIUM 8.4 (L) 12/04/2022 Lab Results Component Value Date  PHOS 3.6 12/04/2022 Lab Results Component Value Date  ALBCREATR  12/04/2022    Comment:    Moderately increased albuminuria (formerly microalbuminuria):  30-300 mg/g Cr Significantly increased albuminuria (overt albuminuria):     >300 mg/g CrCannot calculate Albumin/Creatinine Ratio Lab Results Component Value Date  PRCRRAU 2.55 (H) 12/04/2022 Lab Results Component Value Date  WBC 6.7 12/04/2022  HGB 15.0 12/04/2022  HCT 46.30 12/04/2022  MCV 97.5 12/04/2022  PLT 188 12/04/2022 Lab Results Component Value Date  TACROLIMUS 12.1 12/04/2022 Adam Harper is Lab Results Component Value Date  CYTOMEGALOVI Positive (A) 10/02/2019 Most recent BK viral testing -- BK Virus Quantitative PCR (no units) Date Value 05/27/2022 Not Detected Most recent CMV viral testing -- Cytomegalovirus Quantitative PCR (no units) Date Value 01/19/2021 Not Detected Assessment / Plan Allograft Function	3 years out from kidney transplant 2/2 FSGS	Creatinine baseline of 1.6-1.8. His creatinine is stable in this range on most recent labs. He does carry a diagnoses of hypomagnesemia on supplementation.  Despite the increase in losartan, he remains with 2.5g proteinuria up from 0.3 in June 2024. Will plan for renal biopsy. ImmunosuppressionEnvarsus 9mg /d. Last level not well timed. Patient will repeat tac level today. MMF 750mg  BIDPrednisone 5mg  dailyBlood Pressure Management Blood pressure is not well controlled following transition to losartan. He remains on coreg 25mg  BID and losartan 100mg  daily due to proteinuria. He will resume 5mg  amlodipine and recheck blood pressure at home daily to inform us of the results in about 1 week. HematologyHemoglobin is in goal range. No changes made.MBD/ CKD	Mild hypocalcemia. Phos in goal range. Pre-Diabetes Management, A1c 6.1	Has been referred to new endocrinologist for Oct 7th. They prefer to follow with transplant endocrinology instead. Appt with Dr. Shawnie Dapper in December. Return to clinic in 1 month with labsTac level today in range. No changes. [name] Huntsman, PA-CTransplant Nephrology9/24/2024 ----Electronically Signed by [name] Huntsman, PA, December 07, 2022

## 2022-12-13 ENCOUNTER — Inpatient Hospital Stay: Admit: 2022-12-13 | Discharge: 2022-12-13 | Payer: BLUE CROSS/BLUE SHIELD | Primary: Internal Medicine

## 2022-12-13 ENCOUNTER — Inpatient Hospital Stay: Admit: 2022-12-13 | Discharge: 2022-12-13 | Payer: PRIVATE HEALTH INSURANCE | Primary: Internal Medicine

## 2022-12-13 DIAGNOSIS — Z79899 Other long term (current) drug therapy: Secondary | ICD-10-CM

## 2022-12-13 DIAGNOSIS — R809 Proteinuria, unspecified: Secondary | ICD-10-CM

## 2022-12-13 DIAGNOSIS — Z940 Kidney transplant status: Secondary | ICD-10-CM

## 2022-12-13 NOTE — Progress Notes
 PROCEDURE: Kidney Transplant Biopsy OPERATOR: Alvira Philips, MD ASSISTANT: Ultrasonographer from Dept. of Diagnostic Imaging ANESTHESIA: Local (Xylocaine) PRE-PROCEDURE DX: Proteinuria POST-PROCEDURE DX: Normal appearing allograft by ultrasound PROCEDURE:      I have confirmed that Jammy is not taking any anticoagulants contraindicated for renal biopsy.After obtaining proper informed consent discussing the risks, benefits, and alternatives to the procedure, and performing the time out procedure, Tobe was placed in a supine position.  In this position, ultrasound revealed a transplanted kidney in the right lower quadrant.  There was no perinephric fluid collection seen.  Cortical medullary differentiation was preserved, and there was no hydronephrosis.  The location of the major renal vessels was identified by Doppler evaluation.  The right lower quadrant was prepped and draped in the standard sterile fashion.  Using sterile technique, the Bx site was infiltrated with 6 ml 1% Xylocaine for local anesthesia.  A small nick in the skin was made using an 11-blade.  Then, using real time ultrasound guidance and an 18-gauge spring loaded instrument, 2 core needle biopsies were taken from the kidney without complication.  Direct pressure was held for 10 minutes.  A sand-bag was applied for continued direct pressure.  Muhammed will remain in the procedure room for a period of 3-4 hours, during which time he will be monitored for evidence of bleeding complications.  Nellie, subsequently, will be discharged to home.

## 2022-12-13 NOTE — Progress Notes
 Ultra sound Guided Percutaneous Kidney Biopsy performed by Dr. Phylliss Blakes. Consent for special procedure obtained and time out performed by physician prior to biopsy.  Patient tolerated procedure well. Sandbag placed on top of the biopsy site for 2 hours and off for an hour.  Vital signs monitored every 30 minutes post procedure.  Patient stable and with no complaints made.  No blood in urine noted after 2 hours. Discharge instructions given to patient and verbalized understanding.

## 2022-12-13 NOTE — Patient Instructions
FOLLOWING YOUR KIDNEY BIOPSY TODAY-- Avoid strenuous exercise for the next 3 days.-- Avoid heavy lifting (no more than 10 lbs) for the next 3 days.-- You may take a bath or shower after 24 hrs.-- Please keep the flexible fabric bandage on for 2 days.  Remove thereafter.-- Continue your diet without any changes.-- Resume all previous medications (see note below).CONTACT OUR OFFICE AT [phone] IF:-- You feel weak or dizzy.-- You have a fever (temperature over 100.4)-- You have shaking chills-- You have blood in your urine.-- You have redness at the biopsy siteSEEK CARE IMMEDIATELY IF:-- You are only urinating small amounts or not at all.-- You have very bad pain in your abdomen or where your procedure was done.-- You suddenly have chest pain or trouble breathing (call 911)_____________________________		____________________________Patient Signature				Nurse Signature_____________________________		_____________________________Date						Patient?s preferred phone#

## 2022-12-15 ENCOUNTER — Encounter: Admit: 2022-12-15 | Payer: PRIVATE HEALTH INSURANCE | Primary: Internal Medicine

## 2022-12-15 NOTE — Progress Notes
 [facility] CenterPost-Transplant Multi-Disciplinary MeetingPlan of CareLuis A Harper is a 62 y.o. 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  Gastroesophageal reflux disease 08/17/2019  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  Newborn affected by abnormality in fetal (intrauterine) heart rate or rhythm, unspecified as to time of onset 08/17/2019  AVF (arteriovenous fistula) (HC Code) 02/20/2019  Arteriovenous fistula stenosis, subsequent encounter 01/12/2019  Mediastinal mass 01/03/2019  Colon polyps 08/10/2018  Internal hemorrhoids 08/10/2018  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 BP Readings from Last 1 Encounters: 12/13/22 (!) 139/91  Pulse Readings from Last 1 Encounters: 12/13/22 67   Transplant Labs:  Labs:Hospital Outpatient Visit on 12/13/2022 Component Date Value  Surgical Case 12/13/2022 [Specimen Received - Evaluation in Progress]  Hospital Outpatient Visit on 12/07/2022 Component Date Value  Tacrolimus Lvl 12/07/2022 8.1  Hospital Outpatient Visit on 12/04/2022 Component Date Value  Magnesium 12/04/2022 1.5 (L)   Phosphorus 12/04/2022 3.6   Tacrolimus Lvl 12/04/2022 12.1   Specimen Status 12/04/2022 Specimen In Lab   HLA Class I Antibody Tes* 12/04/2022 Negative   HLA Class I Antibody Tes* 12/04/2022 LuminexSA-OL   HLA Class I Antibody, Sa* 12/04/2022 EDTA   HLA Class I Antibody Com* 12/04/2022 See Notes   HLA Class II Antibody Te* 12/04/2022 Negative   HLA Class II Antibody Te* 12/04/2022 LuminexSA-OL   HLA Class II Antibody, S* 12/04/2022 EDTA   HLA Class II Antibody Co* 12/04/2022 See Notes   WBC 12/04/2022 6.7   RBC 12/04/2022 4.75   Hemoglobin 12/04/2022 15.0   Hematocrit 12/04/2022 46.30   MCV 12/04/2022 97.5   MCH 12/04/2022 31.6   MCHC 12/04/2022 32.4   RDW-CV 12/04/2022 13.6   Platelets 12/04/2022 188   MPV 12/04/2022 10.4   Neutrophils 12/04/2022 79.3 (H)   Lymphocytes 12/04/2022 11.4 (L)   Monocytes 12/04/2022 7.1   Eosinophils 12/04/2022 1.2   Basophil 12/04/2022 0.5   Immature Granulocytes 12/04/2022 0.5   nRBC 12/04/2022 0.0   Absolute Lymphocyte Count 12/04/2022 0.76   Monocyte Absolute Count 12/04/2022 0.47   Eosinophil Absolute Count 12/04/2022 0.08   Basophil Absolute Count 12/04/2022 0.03   Absolute Immature Granul* 12/04/2022 0.03   Absolute nRBC 12/04/2022 0.00   ANC (Abs Neutrophil Coun* 12/04/2022 5.28   Sodium 12/04/2022 141   Potassium 12/04/2022 3.4   Chloride 12/04/2022 100   CO2 12/04/2022 27   Anion Gap 12/04/2022 14   Glucose 12/04/2022 130 (H)   BUN 12/04/2022 11   Creatinine 12/04/2022 1.71 (H)   Calcium 12/04/2022 8.4 (L) BUN/Creatinine Ratio 12/04/2022 6.4 (L)  eGFR (Creatinine) 12/04/2022 45 (L)   Albumin, Urine, Random 12/04/2022 >4,400.0   Creatinine, Urine, Random 12/04/2022 440   Albumin/Creatinine Ratio* 12/04/2022    Protein/Creatinine Ratio* 12/04/2022 2.55 (H)   Protein Urine Random 12/04/2022 11.23   Creatinine, Urine, Random 12/04/2022 440   Current Transplant Immunosuppression: Immunosuppression     Start End  mycophenolate mofetil (CELLCEPT) 250 mg capsule 11/17/2022 --  Sig - Route: Take 3 capsules (750 mg total) by mouth 2 (two) times daily. - Oral  Notes to Pharmacy: Kidney Transplant Z94.0.  Prior authorization: Approved  predniSONE (DELTASONE) 5 mg tablet 06/21/2022 --  Sig - Route: Take 1 tablet (5 mg total) by mouth daily. - Oral  Notes to Pharmacy: Kidney Transplant Z94.0  tacrolimus XR (ENVARSUS XR) 1 mg 24 hr tablet 07/13/2022 07/13/2023  Sig - Route: Take 1 tablet (1 mg total) by mouth daily. Take with 4 mg tabs for total dose 9 mg daily - Oral  Notes to Pharmacy: Kidney txp date: 10/16/2019  DX Code: Z94.0  Prior authorization: Approved  tacrolimus XR (ENVARSUS XR) 4 mg 24 hr tablet 07/08/2022 --  Sig - Route: Take 2 tablets (8 mg total) by mouth daily. Take with 1 mg tabs for total dose 9 mg daily - Oral  Prior authorization: Approved  Transplant labs and medications have been reviewed by the transplant team. At this time the following plan of care has been established:Pre-lim results:   12/15/2022  12:00 PM Renal Bx Documentation Biopsy Date: 12/13/2022 Biopsy Findings (UNOS Rejection Metrics): No Rejection Biopsy Reason: For Cause For Cause Reason: Proteinuria Biopsy Complications: None Rejection Type: None IFTA: None Concern for TG vs recurrent FSGS. Will await final EM to determine plan.

## 2022-12-20 ENCOUNTER — Encounter: Admit: 2022-12-20 | Payer: PRIVATE HEALTH INSURANCE | Attending: "Endocrinology | Primary: Internal Medicine

## 2022-12-20 MED ORDER — TRULICITY 3 MG/0.5 ML SUBCUTANEOUS PEN INJECTOR
30.5 mg/0.5 mL | SUBCUTANEOUS | 4 refills | 84.00 days | Status: AC
Start: 2022-12-20 — End: ?
  Filled 2022-12-23: qty 6, 84d supply, fill #0

## 2022-12-23 MED FILL — PREDNISONE 5 MG TABLET: 5 mg | ORAL | 30 days supply | Qty: 30 | Fill #6 | Status: CP

## 2022-12-23 MED FILL — CARVEDILOL IMMEDIATE RELEASE 25 MG TABLET: 25 mg | ORAL | 30 days supply | Qty: 60 | Fill #1 | Status: CP

## 2022-12-23 MED FILL — OMEPRAZOLE 20 MG CAPSULE,DELAYED RELEASE: 20 mg | ORAL | 90 days supply | Qty: 90 | Fill #2 | Status: CP

## 2022-12-23 MED FILL — TACROLIMUS XR 4 MG TABLET,EXTENDED RELEASE 24 HR: 4 mg | ORAL | 30 days supply | Qty: 60 | Fill #5 | Status: CP

## 2022-12-23 MED FILL — TACROLIMUS XR 1 MG TABLET,EXTENDED RELEASE 24 HR: 1 mg | ORAL | 30 days supply | Qty: 30 | Fill #6 | Status: CP

## 2022-12-23 MED FILL — MAGNESIUM OXIDE 400 MG (241.3 MG MAGNESIUM) TABLET: 400 mg (241.3 mg magnesium) | ORAL | 90 days supply | Qty: 360 | Fill #2 | Status: CP

## 2022-12-23 MED FILL — ATORVASTATIN 40 MG TABLET: 40 mg | ORAL | 90 days supply | Qty: 90 | Fill #3 | Status: CP

## 2022-12-27 ENCOUNTER — Encounter: Admit: 2022-12-27 | Payer: PRIVATE HEALTH INSURANCE | Primary: Internal Medicine

## 2022-12-28 ENCOUNTER — Encounter
Admit: 2022-12-28 | Payer: PRIVATE HEALTH INSURANCE | Attending: Student in an Organized Health Care Education/Training Program | Primary: Internal Medicine

## 2022-12-28 DIAGNOSIS — N186 End stage renal disease: Secondary | ICD-10-CM

## 2022-12-29 MED ORDER — ATORVASTATIN 40 MG TABLET
40 mg | ORAL_TABLET | Freq: Every day | ORAL | 4 refills | 90.00 days | Status: AC
Start: 2022-12-29 — End: ?
  Filled 2023-03-12: qty 90, 90d supply, fill #0

## 2022-12-30 ENCOUNTER — Ambulatory Visit
Admit: 2022-12-30 | Payer: BLUE CROSS/BLUE SHIELD | Attending: Student in an Organized Health Care Education/Training Program | Primary: Internal Medicine

## 2022-12-30 ENCOUNTER — Inpatient Hospital Stay: Admit: 2022-12-30 | Discharge: 2022-12-30 | Payer: BLUE CROSS/BLUE SHIELD | Primary: Internal Medicine

## 2022-12-30 ENCOUNTER — Encounter: Admit: 2022-12-30 | Payer: PRIVATE HEALTH INSURANCE | Primary: Internal Medicine

## 2022-12-30 DIAGNOSIS — Z940 Kidney transplant status: Secondary | ICD-10-CM

## 2022-12-30 DIAGNOSIS — R809 Proteinuria, unspecified: Secondary | ICD-10-CM

## 2022-12-30 DIAGNOSIS — Z79899 Other long term (current) drug therapy: Secondary | ICD-10-CM

## 2022-12-30 LAB — CBC WITH AUTO DIFFERENTIAL
BKR WAM ABSOLUTE IMMATURE GRANULOCYTES.: 0.03 x 1000/ÂµL (ref 0.00–0.30)
BKR WAM ABSOLUTE LYMPHOCYTE COUNT.: 0.73 x 1000/ÂµL (ref 0.60–3.70)
BKR WAM ABSOLUTE NRBC (2 DEC): 0 x 1000/ÂµL (ref 0.00–1.00)
BKR WAM ANC (ABSOLUTE NEUTROPHIL COUNT): 5.66 x 1000/ÂµL (ref 2.00–7.60)
BKR WAM BASOPHIL ABSOLUTE COUNT.: 0.02 x 1000/ÂµL (ref 0.00–1.00)
BKR WAM BASOPHILS: 0.3 % (ref 0.0–1.4)
BKR WAM EOSINOPHIL ABSOLUTE COUNT.: 0.09 x 1000/ÂµL (ref 0.00–1.00)
BKR WAM EOSINOPHILS: 1.3 % (ref 0.0–5.0)
BKR WAM HEMATOCRIT (2 DEC): 44.8 % (ref 38.50–50.00)
BKR WAM HEMOGLOBIN: 14.4 g/dL (ref 13.2–17.1)
BKR WAM IMMATURE GRANULOCYTES: 0.4 % (ref 0.0–1.0)
BKR WAM LYMPHOCYTES: 10.3 % — ABNORMAL LOW (ref 17.0–50.0)
BKR WAM MCH (PG): 31.3 pg (ref 27.0–33.0)
BKR WAM MCHC: 32.1 g/dL (ref 31.0–36.0)
BKR WAM MCV: 97.4 fL (ref 80.0–100.0)
BKR WAM MONOCYTE ABSOLUTE COUNT.: 0.56 x 1000/ÂµL (ref 0.00–1.00)
BKR WAM MONOCYTES: 7.9 % (ref 4.0–12.0)
BKR WAM MPV: 10.3 fL (ref 8.0–12.0)
BKR WAM NEUTROPHILS: 79.8 % — ABNORMAL HIGH (ref 39.0–72.0)
BKR WAM NUCLEATED RED BLOOD CELLS: 0 % (ref 0.0–1.0)
BKR WAM PLATELETS: 196 x1000/ÂµL (ref 150–420)
BKR WAM RDW-CV: 13.1 % (ref 11.0–15.0)
BKR WAM RED BLOOD CELL COUNT.: 4.6 M/ÂµL (ref 4.00–6.00)
BKR WAM WHITE BLOOD CELL COUNT: 7.1 x1000/ÂµL (ref 4.0–11.0)

## 2022-12-30 LAB — BASIC METABOLIC PANEL
BKR ANION GAP: 13 (ref 7–17)
BKR BLOOD UREA NITROGEN: 16 mg/dL (ref 6–20)
BKR BUN / CREAT RATIO: 9.1 (ref 8.0–23.0)
BKR CALCIUM: 9.1 mg/dL (ref 8.8–10.2)
BKR CHLORIDE: 103 mmol/L (ref 98–107)
BKR CO2: 24 mmol/L (ref 20–30)
BKR CREATININE: 1.76 mg/dL — ABNORMAL HIGH (ref 0.40–1.30)
BKR EGFR, CREATININE (CKD-EPI 2021): 44 mL/min/{1.73_m2} — ABNORMAL LOW (ref >=60)
BKR GLUCOSE: 103 mg/dL — ABNORMAL HIGH (ref 70–100)
BKR POTASSIUM: 4.6 mmol/L (ref 3.3–5.3)
BKR SODIUM: 140 mmol/L (ref 136–144)

## 2022-12-30 LAB — PROTEIN, TOTAL W/CREATININE, URINE, RANDOM     (BH GH LMW YH)
BKR CREATININE, URINE, RANDOM: 231 mg/dL
BKR PROTEIN URINE RANDOM: 2.81 g/L
BKR PROTEIN/CREATININE RATIO, URINE, RANDOM: 1.22 mg/mg{creat} — ABNORMAL HIGH (ref <0.10)

## 2022-12-30 LAB — TACROLIMUS LEVEL     (BH GH L LMW YH): BKR TACROLIMUS BLOOD: 8.4 ng/mL

## 2022-12-30 LAB — ALBUMIN/CREATININE PANEL, URINE, RANDOM
BKR ALBUMIN, URINE, RANDOM: 1993.8 mg/L
BKR ALBUMIN/CREATININE RATIO, URINE, RANDOM: 864.2 mg/g{creat} — ABNORMAL HIGH (ref <30.0)
BKR CREATININE, URINE, RANDOM: 231 mg/dL

## 2022-12-30 LAB — MAGNESIUM: BKR MAGNESIUM: 1.7 mg/dL (ref 1.7–2.4)

## 2022-12-30 LAB — PHOSPHORUS     (BH GH L LMW YH): BKR PHOSPHORUS: 3.4 mg/dL (ref 2.2–4.5)

## 2022-12-31 ENCOUNTER — Encounter
Admit: 2022-12-31 | Payer: PRIVATE HEALTH INSURANCE | Attending: Student in an Organized Health Care Education/Training Program | Primary: Internal Medicine

## 2022-12-31 MED ORDER — LOSARTAN 100 MG TABLET
100 mg | ORAL_TABLET | Freq: Every day | ORAL | 4 refills | 90.00 days | Status: AC
Start: 2022-12-31 — End: ?
  Filled 2023-01-01: qty 90, 90d supply, fill #0

## 2023-01-01 MED FILL — AMLODIPINE 5 MG TABLET: 5 mg | ORAL | 30 days supply | Qty: 30 | Fill #1 | Status: CP

## 2023-01-01 NOTE — Progress Notes
 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 Bactrim switched to AtovaquoneIntermittent pre-syncopal events, holter monitoring.Stent removal 9/15/20213/22: Biopsy: ACUTE CELLULAR REJECTION, BANFF TYPE IA 10/22: Biopsy: ACUTE TUBULAR INJURY, FOCALLast updated : 12/13/19 , Stanford Scotland, MD INTERIM HISTORY  LAST SEEN : 9/24/2024ENCOUNTER DATE : 12/30/2022 GERONE GROSSHEIM is a 62 year old man with past medical history of ESKD secondary to FSGS s/p DDKT on 11/05/19 who presents to clinic for follow up.Patient was last seen in clinic on 12/07/22. In the interim, allograft biopsy was obtained because of worsening proteinuria. Path report showed FSGS with 90% FPE.Gumecindo A Mcclurg does not report symptoms referable to his transplanted kidney.He does report compliance with his immunosuppressive medications.  He does not report drug related side effects.Is doing well overall with no new complaints this visit. BP at home has been in 120s/70s.Takes tacrolimus usually at 10-11 am but yesterday took it at 3 PM.No reports of fever. No reports of nausea, vomiting or diarrhea. No reports of shortness of breath, chest pain. PFSH recorded in the EMR was reviewed with the patient - there are no changes or additions since the last update. Review of Systems: Review of Systems Constitutional:  Negative for chills and fever. Eyes:  Negative for blurred vision. Respiratory:  Negative for cough and shortness of breath.  Cardiovascular:  Negative for chest pain and leg swelling. Gastrointestinal:  Negative for abdominal pain, blood in stool, constipation, diarrhea, nausea and vomiting. Genitourinary:  Negative for dysuria, frequency, hematuria and urgency. Skin:  Negative for rash. Neurological:  Negative for dizziness, weakness and headaches. Physical Exam: Vitals: BP (!) 147/87  - Pulse 73  - Temp 97.1 ?F (36.2 ?C) (Temporal)  - Wt 125 kg  - SpO2 95%  - BMI 41.90 kg/m? Wt: 12/30/22 125 kg 12/07/22 125.1 kg 10/12/22 125.2 kg 09/03/22 123.4 kg 08/31/22 123.4 kg General : Appears well, not in distress, appropriately conversant. HEENT : EOMI , mucous membranes moist Respiratory : NVBS heard bilaterally, no adventitious breath sounds heard. Cardiovascular: S1 S2 + , no added sounds heard. Abdomen : Soft, non-tender, graft site non-tender. Extremities : No edemaIntegument : No rashes on limited skin examImpression & Plan: # ALLOGRAFT FUNCTION 11/05/2019 (Kidney)Stable allograft function with renal function at baseline. Most recent Cr 1.7.Concern for recurrent FSGS given biopsy findings. We will discuss course of action at next Tuesday's multidisciplinary meeting.Continue Losartan 100 mg dailyUrine Pr/Cr is 1.22# IMMUNOSUPPRESSION  Tac level is 8.4 (23 hr trough). No changes to IS for now.Tac 9 mg dailyMMF 750 mg BIDPred 5 mg daily# HYPERTENSIONBP at home is well controlledWill continue the current regimen: Losartan 100 mg daily, Carvedilol 25 mg BID, Amlodipine 5 mg daily# HEMATOLOGYHb / WBC / Platelets reviewed. # MINERAL BONE DISEASE Ca, Mg and Phos reviewed.# HEALTHCARE MAINTENANCE 	Deferred to PCP Luiz's recent laboratory studies were reviewed in the EMR flow sheet. 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)  Colon polyps  Internal hemorrhoids  Mediastinal mass  Arteriovenous fistula stenosis, subsequent encounter  AVF (arteriovenous fistula) (HC Code)  Alkaline phosphatase raised  Decreased testosterone level  Gastroesophageal reflux disease  Lipoprotein deficiency disorder  Newborn affected by abnormality in fetal (intrauterine) heart rate or rhythm, unspecified as to time of onset  Deceased-donor kidney transplant recipient  Immunosuppressive management encounter following kidney transplant  AKI (acute kidney injury) (HC Code)  Kidney transplant complication  Kidney replaced by transplant  Type 2 diabetes mellitus without retinopathy (HC Code) (HC CODE) (HC Code)  Combined form of senile cataract, unspecified laterality    Medications Current Outpatient Medications on File Prior to Visit Medication Sig Dispense Refill  amLODIPine (NORVASC) 5 mg tablet Take 1 tablet (5 mg total) by mouth daily. 30 tablet 11  atorvastatin (LIPITOR) 40 mg tablet Take 1 tablet (40 mg total) by mouth daily. 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 Please dispense supplies covered by patient's insurance. 100 each 3  carvediloL (COREG) 25 mg Immediate Release tablet Take 1 tablet (25 mg total) by mouth 2 (two) times daily with breakfast and dinner. 60 tablet 11  dulaglutide (TRULICITY) 3 mg/0.5 mL Pen Injector Inject 0.5 mLs (3 mg total) under the skin every 7 days. 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 Please dispense supplies covered by patient's insurance. 100 each 3  losartan (COZAAR) 50 mg tablet Take 1 tablet (50 mg total) by mouth daily. (Patient taking differently: Take 2 tablets (100 mg total) by mouth daily.) 90 tablet 3  magnesium oxide (MAG-OX) 400 mg (241.3 mg magnesium) tablet Take 2 tablets (800 mg total) by mouth 2 (two) times daily. 360 tablet 3  mycophenolate mofetil (CELLCEPT) 250 mg capsule Take 3 capsules (750 mg total) by mouth 2 (two) times daily. 240 capsule 11  omeprazole (PRILOSEC) 20 mg capsule Take 1 capsule (20 mg total) by mouth daily. 90 capsule 3  predniSONE (DELTASONE) 5 mg tablet Take 1 tablet (5 mg total) by mouth daily. 30 tablet 11  tacrolimus XR (ENVARSUS XR) 1 mg 24 hr tablet Take 1 tablet (1 mg total) by mouth daily. Take with 4 mg tabs for total dose 9 mg daily 30 tablet 11  tacrolimus XR (ENVARSUS XR) 4 mg 24 hr tablet Take 2 tablets (8 mg total) by mouth daily. Take with 1 mg tabs for total dose 9 mg daily 60 tablet 11  tadalafiL (CIALIS) 20 mg tablet Take 1 tablet (20 mg total) by mouth every other day as needed for erectile dysfunction. (Patient not taking: Reported on 12/07/2022) 10 tablet 3  [DISCONTINUED] chlorthalidone (HYGROTEN) 50 mg tablet Take 1 tablet (50 mg total) by mouth daily. (Patient not taking: Reported on 05/28/2022) 90 tablet 3 No current facility-administered medications on file prior to visit.  Allergies No Known Allergies Altamese Carolina, MD10/17/2024

## 2023-01-02 ENCOUNTER — Inpatient Hospital Stay
Admit: 2023-01-02 | Discharge: 2023-01-02 | Payer: BLUE CROSS/BLUE SHIELD | Attending: Student in an Organized Health Care Education/Training Program

## 2023-01-02 ENCOUNTER — Emergency Department: Admit: 2023-01-02 | Payer: BLUE CROSS/BLUE SHIELD | Primary: Internal Medicine

## 2023-01-02 DIAGNOSIS — M79602 Pain in left arm: Principal | ICD-10-CM

## 2023-01-02 DIAGNOSIS — Z7985 Long-term (current) use of injectable non-insulin antidiabetic drugs: Secondary | ICD-10-CM

## 2023-01-02 DIAGNOSIS — Z7952 Long term (current) use of systemic steroids: Secondary | ICD-10-CM

## 2023-01-02 DIAGNOSIS — M25522 Pain in left elbow: Secondary | ICD-10-CM

## 2023-01-02 DIAGNOSIS — Z79899 Other long term (current) drug therapy: Secondary | ICD-10-CM

## 2023-01-02 DIAGNOSIS — Z79620 Long term (current) use of immunosuppressive biologic: Secondary | ICD-10-CM

## 2023-01-02 LAB — CBC WITH AUTO DIFFERENTIAL
BKR WAM ABSOLUTE IMMATURE GRANULOCYTES.: 0.02 x 1000/ÂµL (ref 0.00–0.30)
BKR WAM ABSOLUTE LYMPHOCYTE COUNT.: 1.01 x 1000/ÂµL (ref 0.60–3.70)
BKR WAM ABSOLUTE NRBC (2 DEC): 0 x 1000/ÂµL (ref 0.00–1.00)
BKR WAM ANC (ABSOLUTE NEUTROPHIL COUNT): 5.7 x 1000/ÂµL (ref 2.00–7.60)
BKR WAM BASOPHIL ABSOLUTE COUNT.: 0.02 x 1000/ÂµL (ref 0.00–1.00)
BKR WAM BASOPHILS: 0.3 % (ref 0.0–1.4)
BKR WAM EOSINOPHIL ABSOLUTE COUNT.: 0.07 x 1000/ÂµL (ref 0.00–1.00)
BKR WAM EOSINOPHILS: 0.9 % (ref 0.0–5.0)
BKR WAM HEMATOCRIT (2 DEC): 41.3 % (ref 38.50–50.00)
BKR WAM HEMOGLOBIN: 14.1 g/dL (ref 13.2–17.1)
BKR WAM IMMATURE GRANULOCYTES: 0.3 % (ref 0.0–1.0)
BKR WAM LYMPHOCYTES: 13.5 % — ABNORMAL LOW (ref 17.0–50.0)
BKR WAM MCH (PG): 32.7 pg (ref 27.0–33.0)
BKR WAM MCHC: 34.1 g/dL (ref 31.0–36.0)
BKR WAM MCV: 95.8 fL (ref 80.0–100.0)
BKR WAM MONOCYTE ABSOLUTE COUNT.: 0.67 x 1000/ÂµL (ref 0.00–1.00)
BKR WAM MONOCYTES: 8.9 % (ref 4.0–12.0)
BKR WAM MPV: 10.3 fL (ref 8.0–12.0)
BKR WAM NEUTROPHILS: 76.1 % — ABNORMAL HIGH (ref 39.0–72.0)
BKR WAM NUCLEATED RED BLOOD CELLS: 0 % (ref 0.0–1.0)
BKR WAM PLATELETS: 195 x1000/ÂµL (ref 150–420)
BKR WAM RDW-CV: 13.1 % (ref 11.0–15.0)
BKR WAM RED BLOOD CELL COUNT.: 4.31 M/ÂµL (ref 4.00–6.00)
BKR WAM WHITE BLOOD CELL COUNT: 7.5 x1000/ÂµL (ref 4.0–11.0)

## 2023-01-02 LAB — BASIC METABOLIC PANEL
BKR ANION GAP: 12 (ref 7–17)
BKR BLOOD UREA NITROGEN: 16 mg/dL (ref 6–20)
BKR BUN / CREAT RATIO: 8.9 (ref 8.0–23.0)
BKR CALCIUM: 8.4 mg/dL — ABNORMAL LOW (ref 8.8–10.2)
BKR CHLORIDE: 102 mmol/L (ref 98–107)
BKR CO2: 24 mmol/L (ref 20–30)
BKR CREATININE: 1.79 mg/dL — ABNORMAL HIGH (ref 0.40–1.30)
BKR EGFR, CREATININE (CKD-EPI 2021): 43 mL/min/{1.73_m2} — ABNORMAL LOW (ref >=60)
BKR GLUCOSE: 133 mg/dL — ABNORMAL HIGH (ref 70–100)
BKR POTASSIUM: 3.8 mmol/L (ref 3.3–5.3)
BKR SODIUM: 138 mmol/L (ref 136–144)

## 2023-01-02 LAB — SEDIMENTATION RATE (ESR): BKR SEDIMENTATION RATE, ERYTHROCYTE: 21 mm/h — ABNORMAL HIGH (ref 0–20)

## 2023-01-02 LAB — C-REACTIVE PROTEIN     (CRP): BKR C-REACTIVE PROTEIN, HIGH SENSITIVITY: 8.6 mg/L — ABNORMAL HIGH

## 2023-01-02 LAB — URIC ACID: BKR URIC ACID: 5.1 mg/dL (ref 2.7–7.3)

## 2023-01-02 LAB — PHOSPHORUS     (BH GH L LMW YH): BKR PHOSPHORUS: 3 mg/dL (ref 2.2–4.5)

## 2023-01-02 LAB — MAGNESIUM: BKR MAGNESIUM: 1.6 mg/dL — ABNORMAL LOW (ref 1.7–2.4)

## 2023-01-02 MED ORDER — LIDOCAINE 4 % TOPICAL PATCH
4 % | TRANSDERMAL | Status: DC
Start: 2023-01-02 — End: 2023-01-02

## 2023-01-02 MED ORDER — MAGNESIUM OXIDE (MAG-OX) 200 MG HALFTAB
200 mg | ORAL | Status: DC
Start: 2023-01-02 — End: 2023-01-02
  Administered 2023-01-02: 16:00:00 200 mg via ORAL

## 2023-01-02 MED ORDER — LIDOCAINE 5 % ADHESIVE PATCH
5 | MEDICATED_PATCH | TRANSDERMAL | 1 refills | Status: AC
Start: 2023-01-02 — End: ?

## 2023-01-02 MED ORDER — ACETAMINOPHEN 500 MG TABLET
500 | Freq: Once | ORAL | Status: CP
Start: 2023-01-02 — End: ?
  Administered 2023-01-02: 15:00:00 500 mg via ORAL

## 2023-01-02 NOTE — ED Notes
 9:26 AM Aline August, RNPatient coming in for constant left elbow pain and slight swelling that started two days ago. Took tylenol without relief. Last dose yesterday. Reports no falls or trauma to the left arm. Denies CP and SOB. 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 Also, reports numbness/tingling on his left fingertips that is concerning to him. Has been checking his blood sugars and have been ranging from 101-124. AXO x 4 hypertensive in the 150's systolic. Past Medical History: Diagnosis Date  A-V fistula (HC Code)   right arm  Anemia   Anemia in ESRD (end-stage renal disease) (HC Code) 10/24/2014  Formatting of this note might be different from the original. 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) (HC CODE) (HC Code)   Mon-Wed-Fri  GERD (gastroesophageal reflux disease)   Hypercholesteremia   Hyperparathyroidism (HC Code)   Hypertension   Infected prosthetic vascular graft, initial encounter (HC Code) (HC CODE) (HC Code) 10/24/2014  Influenza A   Morbid obesity (HC Code) 02/03/2018  Obstructive sleep apnea   pt uses CPAP  Secondary hyperparathyroidism of renal origin (HC Code)   Thoracic aortic aneurysm (HC Code)  10:11 AMPatient to XRAY.10:18 AMPatient back from XRAY. 12:04 PMOrtho at bedside. 1:13 PMProvider at bedside. 1:34 PMChecked for magnesium in all pyxis in the annex and main ED and unable to obtain. Medication requested through central pharmacy. 2:45 PMPatient discharged. Patient verbalized understanding. Patient walked with a steady gait out of department.

## 2023-01-02 NOTE — ED Provider Notes
 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 The left arm discomfort started yesterday morning. He denied any new trauma or injuries. He said the discomfort is located in the left elbow and radiates down. He also endorses new tingling sensation in his fingers. He denies any chest pain, SOB, shoulder pain. He follows closely with Nephrology iso ESKD. He denied any exposure to ticks or bugs. He has a dog at home. He is retired. He denied any substance use, smoking or alcohol use. ED Triage Vitals [01/02/23 0858]BP: (!) 153/69Pulse: 80Pulse from  O2 sat: n/aResp: 18Temp: (!) 96.3 ?F (35.7 ?C)Temp src: n/aSpO2: 98 % BP (!) 144/78  - Pulse 74  - Temp 98 ?F (36.7 ?C) (Oral)  - Resp 18  - SpO2 (!) 93% Physical ExamAlert and oriented, not ill appearing or in distress. Normal sensation, pulses and strength bilaterally. Left arm shows positive Phalen and tinel test. There's increased warmth on the left elbow compared to the right. Normal range of motion. Tenderness in the posterior arm. A/P62 year old male w/ ESKD who presents with left arm pain. Must rule out electrolyte derangements, septic arthritis, fracture, carpal tunnel and ACS.- XR elbow left- BMP- Tylenol 1000 mg- Lidocaine patch left elbow- CRP- Mag, Phos- EKGProceduresAttestation/Critical CarePatient Reevaluation: Case was discussed with orthopedic surgery who state that it is unlikely to be septic arthritis at this time base on the laboratory values and patient's response to medical therapy here. Uric acid negative here. Suspect possible bursitis vs overuse. Encouraged to continue tylenol at home and lidocaine patches. Will discharge with return precautionsPatient progress: improvedComments as of 01/02/23 1807 Sun Jan 02, 2023 1610 Attending Supervised: ResidentI saw and examined the patient. I agree with the findings and plan of care as documented in the resident's note except as noted below. Additional acute and/or chronic problems addressed: 62 year old male PMHx ESRD s/p transplant with left arm pain onset yesterday. No chest pain. No SOB. No recent trauma or falls. Physical exam as noted above. Differential diagnosis includes but is not limited to septic arthritis, fracture, dislocation, carpel tunnel, gout. Plan for labs, meds, and Darnelle Catalan [AM]  Comments User Index[AM] Carolynn Serve, DO   Clinical Impressions as of 01/02/23 1807 Left elbow pain  ED DispositionDischarge Carolynn Serve, DO10/20/24 1807 Carolynn Serve, DO10/20/24 1807

## 2023-01-04 NOTE — Other
 ORTHOPAEDICS & REHABILITATIONPatient name:  Adam Harper MRN:	  [id number] Date of birth:	  02-Apr-1962Date of visit:    10/20/2024Attending of record for this patient: Carolynn Serve, DO   Provider leaving this note: Robley Fries, MDInitial ConsultationReason for consultation:Rule out septic arthritis of left elbow jointHPI:Adam Harper is a 62 y.o. male a history of renal transplant (2021; currently on tacrolimus; R AV fistula) with insidious onset left proximal forearm pain that started yesterday.  Denies pain with ROM. + warmth and swelling, no erythema.  Denies fever, chills, pain in additional joints, IVDU or known tick bites.  No concern for STI.  No history of similar symptoms in the pastIPad Spanish interpreter usedEtOH:  DeniesTobacco use:  DeniedIllicit drugs:  DeniesPMHPast Medical History: Diagnosis Date  A-V fistula (HC Code)   right arm  Anemia   Anemia in ESRD (end-stage renal disease) (HC Code) 10/24/2014  Formatting of this note might be different from the original. Overview:  Added automatically from request for surgery [id number] Overview:  Added automatically from request for surgery [id number]  Anemia in ESRD (end-stage renal disease) (HC Code) 10/24/2014  Added automatically from request for surgery [id number]  Added automatically from request for surgery [id number] Overview:  Added automatically from request for surgery [id number]  AV fistula infection (HC Code) (HC CODE) (HC Code)   Cirrhosis (HC Code)   ESRD on hemodialysis (HC Code) (HC CODE) (HC Code)   Mon-Wed-Fri  GERD (gastroesophageal reflux disease)   Hypercholesteremia   Hyperparathyroidism (HC Code)   Hypertension   Infected prosthetic vascular graft, initial encounter (HC Code) (HC CODE) (HC Code) 10/24/2014  Influenza A   Morbid obesity (HC Code) 02/03/2018  Obstructive sleep apnea   pt uses CPAP  Secondary hyperparathyroidism of renal origin (HC Code)   Thoracic aortic aneurysm (HC Code)  PSHPast Surgical History: Procedure Laterality Date  AV FISTULA PLACEMENT Left   left lower arm unsuccessful, then moved to upper arm 2011, then had problems in 10/2013 - infection AllergiesNo Known AllergiesOutpatient MedicationsCurrent Outpatient Medications Medication Instructions  amLODIPine (NORVASC) 5 mg, Oral, Daily  atorvastatin (LIPITOR) 40 mg, Oral, Daily  blood sugar diagnostic test strips Use to check blood sugar daily [Type 2 Diabetes: E11.9]. Please dispense supplies covered by patient's insurance.  carvediloL (COREG) 25 mg, Oral, 2 Times Daily With Breakfast and Dinner  dulaglutide (TRULICITY) 3 mg/0.5 mL Pen Injector Inject 0.5 mLs (3 mg total) under the skin every 7 days.  Envarsus XR 8 mg, Oral, Daily, Take with 1 mg tabs for total dose 9 mg daily  Envarsus XR 1 mg, Oral, Daily, Take with 4 mg tabs for total dose 9 mg daily  lancets Use to check blood sugar daily [Type 2 Diabetes: E11.9]. Please dispense supplies covered by patient's insurance.  losartan (COZAAR) 100 mg, Oral, Daily  magnesium oxide (MAG-OX) 800 mg, Oral, 2 Times Daily Scheduled  mycophenolate mofetil (CELLCEPT) 750 mg, Oral, 2 Times Daily Scheduled  omeprazole (PRILOSEC) 20 mg, Oral, Daily  predniSONE (DELTASONE) 5 mg, Oral, Daily  tadalafiL (CIALIS) 20 mg tablet Take 1 tablet (20 mg total) by mouth every other day as needed for erectile dysfunction. Review of systemsConstitutional: Denies fevers, chillsENT: Denies any changesEyes: Denies any acute visual changesCardiovascular: Denies chest pain, palpitationsRespiratory: Denies difficulty breathing, other respiratory changesGastrointestinal: Denies constipation, diarrheaGenitourinary: Denies changes in urinationMusculoskeletal: Besides Chief Complaint, does other musculoskeletal pain/changesSkin: Denies any skin changesNeurologic: Denies any acute neurologic changesPsychiatric: Denies acute psychiatric changesEndocrine: Denies any endocrine changesHematologic: Denies any hematologic changesAllergic: Denies any allergic reactions/changes  besides up to date allergy listPhysical ExaminationVitals:  01/02/23 1216 BP: (!) 168/77 Pulse: 81 Resp: 18 Temp: 98 ?F (36.7 ?C) Awake, alert, NADNo respiratory distressRegular rateLUE:Mild swelling and Moderate warmth with overlying TTP to the posterior left proximal forearm where there is also a Woody texture to the skin with darkened appearance consistent with acanthosis nigricans (+darkened appearance c/w AN to contralateral side); no erythemaNo appreciable effusion around the L elbowNo ttp medial or lateral condyle of elbow or tip of olecranonNo pain w/ PROM or AROMAROM L elbow 0-110, PROM 0-115 Painless pronation and supinationSensation present grossly radial/median/ulnar nervesAble to make an okay sign, spread fingers, and thumbs-upFingers warm and well perfused, palpable radial pulse, cap refill <2sImaging / studies: XR elbow left:  No acute fracture or dislocation.  Very small joint effusionLabs: Lab Results Component Value Date  CREATININE 1.79 (H) 01/02/2023  BUN 16 01/02/2023  NA 138 01/02/2023  K 3.8 01/02/2023  CL 102 01/02/2023  CO2 24 01/02/2023 Lab Results Component Value Date  WBC 7.1 12/30/2022  HGB 14.4 12/30/2022  HCT 44.80 12/30/2022  MCV 97.4 12/30/2022  PLT 196 12/30/2022 Lab Results Component Value Date  INR 1.06 11/05/2019  INR 1.1 02/15/2019  INR 1.09 10/03/2018 ESR: 21 (24 on 10/28/22)CRP: 8.6WBC: 7.5 Assessment and Plan of Care:  Adam Harper is a 62 y.o. male with left proximal forearm pain insidious onset yesterday for which Ortho was consulted to r/o left elbow septic arthritis.  Pt is afebrile, VSS, no leukocytosis, CRP 8.6, ESR 21 (down from 24 on 10/28/22). Given labs and excellent ROM on clinical exam, low c/f septic arthritis of the left elbow at this time. Alternate Ddx includes (but not limited to) cellulitis.-No plan for aspiration of L elbow by Ortho at this time- recommend soft tissue rest, ice, elevation-defer additional w/u and mgmt to EDWill Discuss with Dr. Luther Parody Signed: Sharma Covert, MDOrthopaedic SurgeryAvailable via St. [facility] 01/02/23

## 2023-01-06 ENCOUNTER — Inpatient Hospital Stay: Admit: 2023-01-06 | Discharge: 2023-01-06 | Payer: BLUE CROSS/BLUE SHIELD | Attending: Emergency Medicine

## 2023-01-06 ENCOUNTER — Emergency Department: Admit: 2023-01-06 | Payer: BLUE CROSS/BLUE SHIELD | Primary: Internal Medicine

## 2023-01-06 ENCOUNTER — Telehealth: Admit: 2023-01-06 | Payer: PRIVATE HEALTH INSURANCE | Primary: Internal Medicine

## 2023-01-06 DIAGNOSIS — Z940 Kidney transplant status: Secondary | ICD-10-CM

## 2023-01-06 DIAGNOSIS — I808 Phlebitis and thrombophlebitis of other sites: Secondary | ICD-10-CM

## 2023-01-06 LAB — CBC WITH AUTO DIFFERENTIAL
BKR WAM ABSOLUTE IMMATURE GRANULOCYTES.: 0.04 x 1000/ÂµL (ref 0.00–0.30)
BKR WAM ABSOLUTE LYMPHOCYTE COUNT.: 0.55 x 1000/ÂµL — ABNORMAL LOW (ref 0.60–3.70)
BKR WAM ABSOLUTE NRBC (2 DEC): 0 x 1000/ÂµL (ref 0.00–1.00)
BKR WAM ANC (ABSOLUTE NEUTROPHIL COUNT): 6.08 x 1000/ÂµL (ref 2.00–7.60)
BKR WAM BASOPHIL ABSOLUTE COUNT.: 0.03 x 1000/ÂµL (ref 0.00–1.00)
BKR WAM BASOPHILS: 0.4 % (ref 0.0–1.4)
BKR WAM EOSINOPHIL ABSOLUTE COUNT.: 0.07 x 1000/ÂµL (ref 0.00–1.00)
BKR WAM EOSINOPHILS: 1 % (ref 0.0–5.0)
BKR WAM HEMATOCRIT (2 DEC): 41.5 % (ref 38.50–50.00)
BKR WAM HEMOGLOBIN: 13.4 g/dL (ref 13.2–17.1)
BKR WAM IMMATURE GRANULOCYTES: 0.5 % (ref 0.0–1.0)
BKR WAM LYMPHOCYTES: 7.5 % — ABNORMAL LOW (ref 17.0–50.0)
BKR WAM MCH (PG): 31.2 pg (ref 27.0–33.0)
BKR WAM MCHC: 32.3 g/dL (ref 31.0–36.0)
BKR WAM MCV: 96.5 fL (ref 80.0–100.0)
BKR WAM MONOCYTE ABSOLUTE COUNT.: 0.55 x 1000/ÂµL (ref 0.00–1.00)
BKR WAM MONOCYTES: 7.5 % (ref 4.0–12.0)
BKR WAM MPV: 10.1 fL (ref 8.0–12.0)
BKR WAM NEUTROPHILS: 83.1 % — ABNORMAL HIGH (ref 39.0–72.0)
BKR WAM NUCLEATED RED BLOOD CELLS: 0 % (ref 0.0–1.0)
BKR WAM PLATELETS: 193 x1000/ÂµL (ref 150–420)
BKR WAM RDW-CV: 13 % (ref 11.0–15.0)
BKR WAM RED BLOOD CELL COUNT.: 4.3 M/ÂµL (ref 4.00–6.00)
BKR WAM WHITE BLOOD CELL COUNT: 7.3 x1000/ÂµL (ref 4.0–11.0)

## 2023-01-06 LAB — C-REACTIVE PROTEIN     (CRP): BKR C-REACTIVE PROTEIN, HIGH SENSITIVITY: 13.3 mg/L — ABNORMAL HIGH

## 2023-01-06 LAB — BASIC METABOLIC PANEL
BKR ANION GAP: 12 (ref 7–17)
BKR BLOOD UREA NITROGEN: 15 mg/dL (ref 6–20)
BKR BUN / CREAT RATIO: 8.7 (ref 8.0–23.0)
BKR CALCIUM: 8.4 mg/dL — ABNORMAL LOW (ref 8.8–10.2)
BKR CHLORIDE: 105 mmol/L (ref 98–107)
BKR CO2: 26 mmol/L (ref 20–30)
BKR CREATININE: 1.73 mg/dL — ABNORMAL HIGH (ref 0.40–1.30)
BKR EGFR, CREATININE (CKD-EPI 2021): 45 mL/min/{1.73_m2} — ABNORMAL LOW (ref >=60)
BKR GLUCOSE: 119 mg/dL — ABNORMAL HIGH (ref 70–100)
BKR POTASSIUM: 4.1 mmol/L (ref 3.3–5.3)
BKR SODIUM: 143 mmol/L (ref 136–144)

## 2023-01-06 LAB — SEDIMENTATION RATE (ESR): BKR SEDIMENTATION RATE, ERYTHROCYTE: 40 mm/h — ABNORMAL HIGH (ref 0–20)

## 2023-01-06 NOTE — Discharge Instructions
 Evaluado hoy para el dolor de las extremidades superiores. Se determina que tiene irritaci?n superficial de las venas. Tome tylenol para el dolor como se indica en el frasco y aplique compresas tibias. Vuelva en 5-7 d?as si no hay mejor?a de los s?ntomas para repetir la ecograf?a

## 2023-01-06 NOTE — ED Notes
 7:22 PM Chief Complaint Patient presents with  Arm Pain   Left arm swelling x 1 week approximately  Report received care assumed. Pt AAOx4 speaking in full and clear sentences, resting comfortably in stretcher, respirations even and unlabored, in NAD.7:51 PMPt given discharge instructions per MD, communicates comprehension. IV removed & VSS. Patient discharged into self care at this time.

## 2023-01-06 NOTE — ED Notes
 3:08 PM Presents to the ED for worsening left arm discomfort and swelling from elbow down his forearm. Patient also endorsing left arm numbness and tingling from elbow down x1 week. Patient reports that he was seen this past Sunday for same issue with a negative workup. Pmh of kidney transplant, was told to come in by transplant team to Rule out DVT. -SOB. Not on thinners. Chief Complaint Patient presents with  Arm Pain   Left arm swelling x 1 week approximately

## 2023-01-06 NOTE — Telephone Encounter
 LogEventMarland Kitchen [name] #: [id number] Sent: 01/06/2023 11:34 MTCaller Name: Shari Zeedyk SantiagoTel: [phone] Ext: From: Post Kidney Message Type: General MessageMessage: Pt: Name: Adam Harper, Godette: Sep 24, 1962MD: Unknown You are seen at [facility] transplant in [place], correct? YesPre or Post: PostOrgan Type: KidneyRE: Having pain and swelling in left arm. Left hand is also tingling. Also itchy feeling and burning. Would like a call back from coordinator about this. Been going on for a few days now. Patient went to the ER on Sunday but they did not find anything. Name of Pharmacy (if applicable):Spanish Interpreter ID: [id number] Returned call to pt in preferred language. He reports that his L arm began on Saturday with mild pain but has since progressed to LUE swelling with tingling and some itchiness. He advises the arm is also red and warm to the touch. Will request pt to proceed to ED to r/o DVT via [place]. Pt and spouse both updated and agreeable.

## 2023-01-07 NOTE — ED Provider Notes
 Chief Complaint Patient presents with  Arm Pain   Left arm swelling x 1 week approximately  HPI/PE:Patient is a 62 year old male with PMHx of kidney transplant, in 2021, for end-stage renal disease, diabetes mellitus presents for left arm swelling and redness over the past week. States that he was at dialysis today and he was sent in for a DVT rule out. States that he has pain which radiates from the elbow down to the forearm. Seen in the ED for this condition on October 20th and ortho was able to rule out septic arthritis. Patient states that hs symptoms have stayed about the same, but notices that the redness has mildly increased. Further denies any new trauma, falls, shortness of breath, chest pain, fever, chills, ticks, initial cuts or injury. Patient is not currently anti-coagulated. Vitals:	01/06/23 1440BP:	(!) 145/79Pulse:	83Resp:	18Temp:	98.3 ?F (36.8 ?C)General:  Appears well, no distressHeart:  normal rate and rhythm, no murmur, gallops or rubLungs:  Clear to auscultation bilaterally.  Normal respiratory effortAbdomen:  Abdomen non-distended, soft, no tenderness Neuro: Alert, normal speech, no focal findingsMusculoskeletal: Mild edema and redness over the posterior forearm with normal pulses. Full ROM around the elbow. No erythema, warmth, or edema around the elbow. Distal extremities well-perfused. AV fistula noted to the right arm with thrill. MDM:DDX: MSK sprain/strain, doubt DVT, cellulitis, and septic arthritisMDM: Patient is non-toxic appearing and well-appearing presents for re-evaluation of left arm swelling, redness, and edema. Sent in by dialysis to rule out DVT. Consider DVT based on unilateral swelling and redness, so plan to obtain ultrasound. Basic labs ordered to assess for leukocytosis. Doubt, but consider septic arthritis as there is no redness or swelling extending from the joint space. Ultrasound is negative for DVT, but shows sings of superficial thrombophlebitis of the cephalic vein of the forearm. Patient informed that if symptoms persist over 5-7 days, he should return to the ED for repeat ultrasound. Patient has mild elevation of ESR and CRP, although denies any worsening of symptoms. Additionally, no white count to suggest cellulitis. Patient educated on this finding and instructed tto take tylenol and apply warm compresses to the region. Patient is stable for discharge at this time with strict return precautions and primary care provider follow-up.  Plan:-Right upper extremity ultrasound-basic labs-ESR/CRPResults:-upper extremity ultrasound: Superficial thrombophlebitis of the cephalic vein in the forearm. No evidence of deep venous thrombosis.-ESR: 40-CRP: 13.3-white count: 7.3Patient plan and care discussed with Dr. Christell Constant   Physical ExamED Triage Vitals [01/06/23 1440]BP: (!) 145/79Pulse: 83Pulse from  O2 sat: n/aResp: 18Temp: 98.3 ?F (36.8 ?C)Temp src: OralSpO2: 95 % BP (!) 140/80  - Pulse 74  - Temp 97.9 ?F (36.6 ?C) (Oral)  - Resp 18  - SpO2 95% Physical ExamVitals and nursing note reviewed.  ProceduresAttestation/Critical CareComments as of 01/06/23 2303 Thu Jan 06, 2023 1642 WBC: 7.3 [SC]  Comments User Index[SC] Bennye Alm, [place]   Clinical Impressions as of 01/06/23 2303 Superficial thrombophlebitis of left upper extremity  ED DispositionDischarge Bennye Alm, PA10/24/24 1933 Leandra Kern Dixon, PA10/24/24 2303

## 2023-01-14 ENCOUNTER — Telehealth: Admit: 2023-01-14 | Payer: PRIVATE HEALTH INSURANCE | Primary: Internal Medicine

## 2023-01-14 NOTE — Telephone Encounter
 LogEventMarland Kitchen [name] #: [id number] Sent: 01/14/2023 09:03 MTCaller Name: Yitzchok Carriger Tel: [phone]: post kidneyMessage Type: General MessageMessage: Pt: Name:Beryl SantiagoDOB:12/21/1962You are seen at [facility] transplant in [place], correct? yesPre or Post: postOrgan Type:kidneyRE: Patient  says that he has itching and swelling in left arm, visited and obtained meds from Vascular doctor that was referred by his pcp and wants to know if its safe to take .  Cephalexin 500mg  Name of Pharmacy (if applicable): [facility] Safe to take, called him with interpreter, she left a VM

## 2023-01-19 MED FILL — MYCOPHENOLATE MOFETIL 250 MG CAPSULE: 250 mg | ORAL | 40 days supply | Qty: 240 | Fill #1 | Status: CP

## 2023-01-19 MED FILL — PREDNISONE 5 MG TABLET: 5 mg | ORAL | 30 days supply | Qty: 30 | Fill #7 | Status: CP

## 2023-01-19 MED FILL — TACROLIMUS XR 1 MG TABLET,EXTENDED RELEASE 24 HR: 1 mg | ORAL | 30 days supply | Qty: 30 | Fill #7 | Status: CP

## 2023-01-19 MED FILL — CARVEDILOL IMMEDIATE RELEASE 25 MG TABLET: 25 mg | ORAL | 30 days supply | Qty: 60 | Fill #2 | Status: CP

## 2023-01-19 MED FILL — TACROLIMUS XR 4 MG TABLET,EXTENDED RELEASE 24 HR: 4 mg | ORAL | 30 days supply | Qty: 60 | Fill #6 | Status: CP

## 2023-01-24 ENCOUNTER — Inpatient Hospital Stay: Admit: 2023-01-24 | Discharge: 2023-01-24 | Payer: BLUE CROSS/BLUE SHIELD | Primary: Internal Medicine

## 2023-01-24 DIAGNOSIS — Z79899 Other long term (current) drug therapy: Secondary | ICD-10-CM

## 2023-01-24 DIAGNOSIS — Z940 Kidney transplant status: Secondary | ICD-10-CM

## 2023-01-24 LAB — PROTEIN, TOTAL W/CREATININE, URINE, RANDOM     (BH GH LMW YH)
BKR CREATININE, URINE, RANDOM: 157 mg/dL
BKR PROTEIN URINE RANDOM: 2.08 g/L
BKR PROTEIN/CREATININE RATIO, URINE, RANDOM: 1.32 mg/mg{creat} — ABNORMAL HIGH (ref <0.10)

## 2023-01-24 LAB — CBC WITH AUTO DIFFERENTIAL
BKR WAM ABSOLUTE IMMATURE GRANULOCYTES.: 0.03 x 1000/ÂµL (ref 0.00–0.30)
BKR WAM ABSOLUTE LYMPHOCYTE COUNT.: 0.88 x 1000/ÂµL (ref 0.60–3.70)
BKR WAM ABSOLUTE NRBC (2 DEC): 0 x 1000/ÂµL (ref 0.00–1.00)
BKR WAM ANC (ABSOLUTE NEUTROPHIL COUNT): 3.67 x 1000/ÂµL (ref 2.00–7.60)
BKR WAM BASOPHIL ABSOLUTE COUNT.: 0.03 x 1000/ÂµL (ref 0.00–1.00)
BKR WAM BASOPHILS: 0.6 % (ref 0.0–1.4)
BKR WAM EOSINOPHIL ABSOLUTE COUNT.: 0.09 x 1000/ÂµL (ref 0.00–1.00)
BKR WAM EOSINOPHILS: 1.7 % (ref 0.0–5.0)
BKR WAM HEMATOCRIT (2 DEC): 41.5 % (ref 38.50–50.00)
BKR WAM HEMOGLOBIN: 13.3 g/dL (ref 13.2–17.1)
BKR WAM IMMATURE GRANULOCYTES: 0.6 % (ref 0.0–1.0)
BKR WAM LYMPHOCYTES: 17 % (ref 17.0–50.0)
BKR WAM MCH (PG): 31.4 pg (ref 27.0–33.0)
BKR WAM MCHC: 32 g/dL (ref 31.0–36.0)
BKR WAM MCV: 97.9 fL (ref 80.0–100.0)
BKR WAM MONOCYTE ABSOLUTE COUNT.: 0.48 x 1000/ÂµL (ref 0.00–1.00)
BKR WAM MONOCYTES: 9.3 % (ref 4.0–12.0)
BKR WAM MPV: 9.8 fL (ref 8.0–12.0)
BKR WAM NEUTROPHILS: 70.8 % (ref 39.0–72.0)
BKR WAM NUCLEATED RED BLOOD CELLS: 0 % (ref 0.0–1.0)
BKR WAM PLATELETS: 225 x1000/ÂµL (ref 150–420)
BKR WAM RDW-CV: 13.3 % (ref 11.0–15.0)
BKR WAM RED BLOOD CELL COUNT.: 4.24 M/ÂµL (ref 4.00–6.00)
BKR WAM WHITE BLOOD CELL COUNT: 5.2 x1000/ÂµL (ref 4.0–11.0)

## 2023-01-24 LAB — BASIC METABOLIC PANEL
BKR ANION GAP: 14 (ref 7–17)
BKR BLOOD UREA NITROGEN: 10 mg/dL (ref 6–20)
BKR BUN / CREAT RATIO: 6.3 — ABNORMAL LOW (ref 8.0–23.0)
BKR CALCIUM: 7.8 mg/dL — ABNORMAL LOW (ref 8.8–10.2)
BKR CHLORIDE: 104 mmol/L (ref 98–107)
BKR CO2: 24 mmol/L (ref 20–30)
BKR CREATININE: 1.6 mg/dL — ABNORMAL HIGH (ref 0.40–1.30)
BKR EGFR, CREATININE (CKD-EPI 2021): 49 mL/min/{1.73_m2} — ABNORMAL LOW (ref >=60)
BKR GLUCOSE: 116 mg/dL — ABNORMAL HIGH (ref 70–100)
BKR POTASSIUM: 3.7 mmol/L (ref 3.3–5.3)
BKR SODIUM: 142 mmol/L (ref 136–144)

## 2023-01-24 LAB — ALBUMIN/CREATININE PANEL, URINE, RANDOM
BKR ALBUMIN, URINE, RANDOM: 1488 mg/L
BKR ALBUMIN/CREATININE RATIO, URINE, RANDOM: 947.8 mg/g{creat} — ABNORMAL HIGH (ref <30.0)
BKR CREATININE, URINE, RANDOM: 157 mg/dL

## 2023-01-24 LAB — MAGNESIUM: BKR MAGNESIUM: 1.7 mg/dL (ref 1.7–2.4)

## 2023-01-24 LAB — PHOSPHORUS     (BH GH L LMW YH): BKR PHOSPHORUS: 3.9 mg/dL (ref 2.2–4.5)

## 2023-01-24 LAB — TACROLIMUS LEVEL     (BH GH L LMW YH): BKR TACROLIMUS BLOOD: 5.7 ng/mL

## 2023-01-31 ENCOUNTER — Inpatient Hospital Stay: Admit: 2023-01-31 | Discharge: 2023-01-31 | Payer: BLUE CROSS/BLUE SHIELD | Primary: Internal Medicine

## 2023-01-31 DIAGNOSIS — Z940 Kidney transplant status: Secondary | ICD-10-CM

## 2023-01-31 DIAGNOSIS — Z79899 Other long term (current) drug therapy: Secondary | ICD-10-CM

## 2023-02-01 ENCOUNTER — Ambulatory Visit: Admit: 2023-02-01 | Payer: BLUE CROSS/BLUE SHIELD | Primary: Internal Medicine

## 2023-02-01 VITALS — BP 144/73 | HR 78 | Temp 97.70000°F | Wt 279.1 lb

## 2023-02-01 DIAGNOSIS — R809 Proteinuria, unspecified: Secondary | ICD-10-CM

## 2023-02-01 DIAGNOSIS — Z940 Kidney transplant status: Principal | ICD-10-CM

## 2023-02-01 DIAGNOSIS — Z79899 Other long term (current) drug therapy: Secondary | ICD-10-CM

## 2023-02-01 LAB — BASIC METABOLIC PANEL
BKR ANION GAP: 12 (ref 7–17)
BKR BLOOD UREA NITROGEN: 16 mg/dL (ref 6–20)
BKR BUN / CREAT RATIO: 10 (ref 8.0–23.0)
BKR CALCIUM: 8.6 mg/dL — ABNORMAL LOW (ref 8.8–10.2)
BKR CHLORIDE: 101 mmol/L (ref 98–107)
BKR CO2: 23 mmol/L (ref 20–30)
BKR CREATININE: 1.6 mg/dL — ABNORMAL HIGH (ref 0.40–1.30)
BKR EGFR, CREATININE (CKD-EPI 2021): 49 mL/min/{1.73_m2} — ABNORMAL LOW (ref >=60)
BKR GLUCOSE: 106 mg/dL — ABNORMAL HIGH (ref 70–100)
BKR POTASSIUM: 4.1 mmol/L (ref 3.3–5.3)
BKR SODIUM: 136 mmol/L (ref 136–144)

## 2023-02-01 LAB — PROTEIN, TOTAL W/CREATININE, URINE, RANDOM     (BH GH LMW YH)
BKR CREATININE, URINE, RANDOM: 308 mg/dL
BKR PROTEIN URINE RANDOM: 3.9 g/L
BKR PROTEIN/CREATININE RATIO, URINE, RANDOM: 1.27 mg/mg{creat} — ABNORMAL HIGH (ref <0.10)

## 2023-02-01 LAB — CBC WITH AUTO DIFFERENTIAL
BKR WAM ABSOLUTE IMMATURE GRANULOCYTES.: 0.02 x 1000/ÂµL (ref 0.00–0.30)
BKR WAM ABSOLUTE LYMPHOCYTE COUNT.: 0.84 x 1000/ÂµL (ref 0.60–3.70)
BKR WAM ABSOLUTE NRBC (2 DEC): 0 x 1000/ÂµL (ref 0.00–1.00)
BKR WAM ANC (ABSOLUTE NEUTROPHIL COUNT): 5.92 x 1000/ÂµL (ref 2.00–7.60)
BKR WAM BASOPHIL ABSOLUTE COUNT.: 0.04 x 1000/ÂµL (ref 0.00–1.00)
BKR WAM BASOPHILS: 0.5 % (ref 0.0–1.4)
BKR WAM EOSINOPHIL ABSOLUTE COUNT.: 0.07 x 1000/ÂµL (ref 0.00–1.00)
BKR WAM EOSINOPHILS: 0.9 % (ref 0.0–5.0)
BKR WAM HEMATOCRIT (2 DEC): 42.6 % (ref 38.50–50.00)
BKR WAM HEMOGLOBIN: 13.8 g/dL (ref 13.2–17.1)
BKR WAM IMMATURE GRANULOCYTES: 0.3 % (ref 0.0–1.0)
BKR WAM LYMPHOCYTES: 11.3 % — ABNORMAL LOW (ref 17.0–50.0)
BKR WAM MCH (PG): 31.2 pg (ref 27.0–33.0)
BKR WAM MCHC: 32.4 g/dL (ref 31.0–36.0)
BKR WAM MCV: 96.2 fL (ref 80.0–100.0)
BKR WAM MONOCYTE ABSOLUTE COUNT.: 0.56 x 1000/ÂµL (ref 0.00–1.00)
BKR WAM MONOCYTES: 7.5 % (ref 4.0–12.0)
BKR WAM MPV: 10.7 fL (ref 8.0–12.0)
BKR WAM NEUTROPHILS: 79.5 % — ABNORMAL HIGH (ref 39.0–72.0)
BKR WAM NUCLEATED RED BLOOD CELLS: 0 % (ref 0.0–1.0)
BKR WAM PLATELETS: 186 x1000/ÂµL (ref 150–420)
BKR WAM RDW-CV: 13.3 % (ref 11.0–15.0)
BKR WAM RED BLOOD CELL COUNT.: 4.43 M/ÂµL (ref 4.00–6.00)
BKR WAM WHITE BLOOD CELL COUNT: 7.5 x1000/ÂµL (ref 4.0–11.0)

## 2023-02-01 LAB — PHOSPHORUS     (BH GH L LMW YH): BKR PHOSPHORUS: 3.6 mg/dL (ref 2.2–4.5)

## 2023-02-01 LAB — ALBUMIN/CREATININE PANEL, URINE, RANDOM
BKR ALBUMIN, URINE, RANDOM: 2999.1 mg/L
BKR ALBUMIN/CREATININE RATIO, URINE, RANDOM: 974.1 mg/g{creat} — ABNORMAL HIGH (ref <30.0)
BKR CREATININE, URINE, RANDOM: 308 mg/dL

## 2023-02-01 LAB — MAGNESIUM: BKR MAGNESIUM: 1.8 mg/dL (ref 1.7–2.4)

## 2023-02-01 NOTE — Progress Notes
 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 Bactrim switched to AtovaquoneIntermittent pre-syncopal events, holter monitoring.Stent removal 9/15/20213/22: Biopsy: ACUTE CELLULAR REJECTION, BANFF TYPE IA 10/22: Biopsy: ACUTE TUBULAR INJURY, FOCAL9/30/2024: NO EVIDENCE OF ACUTE REJECTION                FOCAL AND SEGMENTAL GLOMERULOSCLEROSIS                ACUTE TUBULAR INJURY, DIFFUSE History of present illness: Adam Harper does not report symptoms referable to his transplanted kidney.Blood pressure at home he reports is 130-140 over 70-80.  He did not have his log for glucose control today for review.He  reports compliance with his immunosuppressive medications and does not report drug related side effects.He denies specifically, fevers, diarrhea, dysuria, urinary urgency and pain over he allograft._______PFSH reviewed and confirmed 11/19/2024Review of Systems In addition he reportsReview of Systems Constitutional: Negative.  HENT: Negative.   Respiratory: Negative.   Cardiovascular: Negative.  Gastrointestinal: Negative.  Genitourinary: Negative.  Neurological:  Negative for dizziness, tremors and headaches. Physical Exam  In general Aydden A Redlich looks well and is in no distress.  he is alert, oriented and fully conversant. Vitals: BP (!) 144/73 (Site: l a, Position: Sitting)  - Pulse 78  - Temp 97.7 ?F (36.5 ?C) (Temporal)  - Wt 126.6 kg  - SpO2 95%  - BMI 42.44 kg/m?  Wt Readings from Last 4 Encounters: 02/01/23 126.6 kg 12/30/22 125 kg 12/07/22 125.1 kg 10/12/22 125.2 kg   Physical ExamVitals reviewed. Constitutional:     Appearance: Normal appearance. Pulmonary:    Effort: Pulmonary effort is normal. Musculoskeletal:    Right lower leg: No edema.    Left lower leg: No edema. Skin:   Coloration: Skin is not jaundiced. Neurological:    Mental Status: He is alert. Psychiatric:       Mood and Affect: Mood normal.       Behavior: Behavior normal.  Assessment / Plan Transplanted Allograft The most recent serum creatinine is Lab Results Component Value Date  CREATININE 1.60 (H) 01/31/2023 Recent albumin/creatinine ratio is Lab Results Component Value Date  ALBCREATR 974.1 (H) 01/31/2023  and recent protein/creatinine ratio is Lab Results Component Value Date  PRCRRAU 1.27 (H) 01/31/2023 .Immunosuppression Management The the most recent tacrolimus level for today's visit is Lab Results Component Value Date  TACROLIMUS 5.7 01/24/2023 Immunosuppression     Start End  mycophenolate mofetil (CELLCEPT) 250 mg capsule 11/17/2022 --  Sig - Route: Take 3 capsules (750 mg total) by mouth 2 (two) times daily. - Oral  Notes to Pharmacy: Kidney Transplant Z94.0.  Prior authorization: Approved  predniSONE (DELTASONE) 5 mg tablet 06/21/2022 --  Sig - Route: Take 1 tablet (5 mg total) by mouth daily. - Oral  Notes to Pharmacy: Kidney Transplant Z94.0  tacrolimus XR (ENVARSUS XR) 1 mg 24 hr tablet 07/13/2022 07/13/2023  Sig - Route: Take 1 tablet (1 mg total) by mouth daily. Take with 4 mg tabs for total dose 9 mg daily - Oral  Notes to Pharmacy: Kidney txp date: 10/16/2019  DX Code: Z94.0  Prior authorization: Approved  tacrolimus XR (ENVARSUS XR) 4 mg 24 hr tablet 07/08/2022 --  Sig -  Route: Take 2 tablets (8 mg total) by mouth daily. Take with 1 mg tabs for total dose 9 mg daily - Oral  Prior authorization: Approved  Blood Pressure ManagementToday's blood pressure is BP Readings from Last 2 Encounters: 02/01/23 (!) 144/73 01/06/23 (!) 140/80 . No change to current regimen.Antihypertensives     Start End  amLODIPine (NORVASC) 5 mg tablet 12/07/2022 --  Sig - Route: Take 1 tablet (5 mg total) by mouth daily. - Oral  carvediloL (COREG) 25 mg Immediate Release tablet 11/19/2022 11/19/2023  Sig - Route: Take 1 tablet (25 mg total) by mouth 2 (two) times daily with breakfast and dinner. - Oral  losartan (COZAAR) 100 mg tablet 12/31/2022 --  Sig - Route: Take 1 tablet (100 mg total) by mouth daily. - Oral  Prophylaxis Current prophylactic Medications CVE:[id number] Prophylaxis   None  HematologyMost recent WBC is Lab Results Component Value Date  WBC 7.5 01/31/2023  and most recent hemoglobin is Lab Results Component Value Date  HGB 13.8 01/31/2023 .  No active issues.Viral monitoring KOSTA PAULMAN is Lab Results Component Value Date  CYTOMEGALOVI Positive (A) 10/02/2019 Most recent BK viral testing -- BK Virus Quantitative PCR (no units) Date Value 05/27/2022 Not Detected Most recent CMV viral testing -- Cytomegalovirus Quantitative PCR (no units) Date Value 01/19/2021 Not Detected ElectrolytesMost recent magnesium is Lab Results Component Value Date  MG 1.8 01/31/2023 , most recent calcium is  Lab Results Component Value Date  CALCIUM 8.6 (L) 01/31/2023  and most recent phosphorus is Lab Results Component Value Date  PHOS 3.6 01/31/2023 .Summary of Plan:Spanish interpretor  [id number] Renal function - 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         - renal function today is stable at 1.6.  Proteinuria- on cozaar 100mg  daily.  Urine pro/cr 1.27.  continue to monitorImmunosuppression - trough level at goal. No changes made today.blood pressure at home 140-130 over 80-73.  Will keep bp log and bring to next appt.Education/counsel on importance on good glucose contorl and bp control----Electronically Signed by Rosezella [place]. Caryn Section APRN, February 01, 2023 Please note: MModal speech recognition software was used to compose this note. Notes are reviewed for accuracy but unintended translational errors can occur. Please contact us if there are any questions about the content of this note.

## 2023-02-05 MED FILL — AMLODIPINE 5 MG TABLET: 5 mg | ORAL | 30 days supply | Qty: 30 | Fill #2 | Status: CP

## 2023-02-16 MED FILL — TACROLIMUS XR 1 MG TABLET,EXTENDED RELEASE 24 HR: 1 mg | ORAL | 30 days supply | Qty: 30 | Fill #8 | Status: CP

## 2023-02-16 MED FILL — CARVEDILOL IMMEDIATE RELEASE 25 MG TABLET: 25 mg | ORAL | 30 days supply | Qty: 60 | Fill #3 | Status: CP

## 2023-02-16 MED FILL — PREDNISONE 5 MG TABLET: 5 mg | ORAL | 30 days supply | Qty: 30 | Fill #8 | Status: CP

## 2023-02-16 MED FILL — TACROLIMUS XR 4 MG TABLET,EXTENDED RELEASE 24 HR: 4 mg | ORAL | 30 days supply | Qty: 60 | Fill #7 | Status: CP

## 2023-02-28 ENCOUNTER — Ambulatory Visit
Admit: 2023-02-28 | Payer: BLUE CROSS/BLUE SHIELD | Attending: Student in an Organized Health Care Education/Training Program | Primary: Internal Medicine

## 2023-03-12 MED FILL — OMEPRAZOLE 20 MG CAPSULE,DELAYED RELEASE: 20 mg | ORAL | 90 days supply | Qty: 90 | Fill #3 | Status: CP

## 2023-03-12 MED FILL — TACROLIMUS XR 1 MG TABLET,EXTENDED RELEASE 24 HR: 1 mg | ORAL | 30 days supply | Qty: 30 | Fill #9 | Status: CP

## 2023-03-12 MED FILL — LOSARTAN 100 MG TABLET: 100 mg | ORAL | 90 days supply | Qty: 90 | Fill #1 | Status: CP

## 2023-03-12 MED FILL — MYCOPHENOLATE MOFETIL 250 MG CAPSULE: 250 mg | ORAL | 40 days supply | Qty: 240 | Fill #2 | Status: CP

## 2023-03-12 MED FILL — AMLODIPINE 5 MG TABLET: 5 mg | ORAL | 30 days supply | Qty: 30 | Fill #3 | Status: CP

## 2023-03-12 MED FILL — PREDNISONE 5 MG TABLET: 5 mg | ORAL | 30 days supply | Qty: 30 | Fill #9 | Status: CP

## 2023-03-12 MED FILL — TACROLIMUS XR 4 MG TABLET,EXTENDED RELEASE 24 HR: 4 mg | ORAL | 30 days supply | Qty: 60 | Fill #8 | Status: CP

## 2023-03-12 MED FILL — CARVEDILOL IMMEDIATE RELEASE 25 MG TABLET: 25 mg | ORAL | 30 days supply | Qty: 60 | Fill #4 | Status: CP

## 2023-04-05 ENCOUNTER — Inpatient Hospital Stay: Admit: 2023-04-05 | Discharge: 2023-04-05 | Payer: BLUE CROSS/BLUE SHIELD | Primary: Internal Medicine

## 2023-04-05 DIAGNOSIS — Z79899 Other long term (current) drug therapy: Secondary | ICD-10-CM

## 2023-04-05 DIAGNOSIS — Z940 Kidney transplant status: Principal | ICD-10-CM

## 2023-04-05 DIAGNOSIS — R809 Proteinuria, unspecified: Secondary | ICD-10-CM

## 2023-04-05 LAB — BASIC METABOLIC PANEL
BKR ANION GAP: 12 (ref 7–17)
BKR BLOOD UREA NITROGEN: 14 mg/dL (ref 6–20)
BKR BUN / CREAT RATIO: 8.2 (ref 8.0–23.0)
BKR CALCIUM: 8 mg/dL — ABNORMAL LOW (ref 8.8–10.2)
BKR CHLORIDE: 105 mmol/L (ref 98–107)
BKR CO2: 25 mmol/L (ref 20–30)
BKR CREATININE DELTA: 0.1
BKR CREATININE: 1.7 mg/dL — ABNORMAL HIGH (ref 0.40–1.30)
BKR EGFR, CREATININE (CKD-EPI 2021): 46 mL/min/{1.73_m2} — ABNORMAL LOW (ref >=60)
BKR GLUCOSE: 131 mg/dL — ABNORMAL HIGH (ref 70–100)
BKR POTASSIUM: 3.6 mmol/L (ref 3.3–5.3)
BKR SODIUM: 142 mmol/L (ref 136–144)

## 2023-04-05 LAB — MAGNESIUM: BKR MAGNESIUM: 1.5 mg/dL — ABNORMAL LOW (ref 1.7–2.4)

## 2023-04-05 LAB — CBC WITH AUTO DIFFERENTIAL
BKR WAM ABSOLUTE IMMATURE GRANULOCYTES.: 0.02 x 1000/ÂµL (ref 0.00–0.30)
BKR WAM ABSOLUTE LYMPHOCYTE COUNT.: 0.96 x 1000/ÂµL (ref 0.60–3.70)
BKR WAM ABSOLUTE NRBC (2 DEC): 0 x 1000/ÂµL (ref 0.00–1.00)
BKR WAM ANC (ABSOLUTE NEUTROPHIL COUNT): 3.83 x 1000/ÂµL (ref 2.00–7.60)
BKR WAM BASOPHIL ABSOLUTE COUNT.: 0.03 x 1000/ÂµL (ref 0.00–1.00)
BKR WAM BASOPHILS: 0.6 % (ref 0.0–1.4)
BKR WAM EOSINOPHIL ABSOLUTE COUNT.: 0.09 x 1000/ÂµL (ref 0.00–1.00)
BKR WAM EOSINOPHILS: 1.7 % (ref 0.0–5.0)
BKR WAM HEMATOCRIT (2 DEC): 42.3 % (ref 38.50–50.00)
BKR WAM HEMOGLOBIN: 13.5 g/dL (ref 13.2–17.1)
BKR WAM IMMATURE GRANULOCYTES: 0.4 % (ref 0.0–1.0)
BKR WAM LYMPHOCYTES: 17.7 % (ref 17.0–50.0)
BKR WAM MCH (PG): 31 pg (ref 27.0–33.0)
BKR WAM MCHC: 31.9 g/dL (ref 31.0–36.0)
BKR WAM MCV: 97.2 fL (ref 80.0–100.0)
BKR WAM MONOCYTE ABSOLUTE COUNT.: 0.5 x 1000/ÂµL (ref 0.00–1.00)
BKR WAM MONOCYTES: 9.2 % (ref 4.0–12.0)
BKR WAM MPV: 10.1 fL (ref 8.0–12.0)
BKR WAM NEUTROPHILS: 70.4 % (ref 39.0–72.0)
BKR WAM NUCLEATED RED BLOOD CELLS: 0 % (ref 0.0–1.0)
BKR WAM PLATELETS: 190 x1000/ÂµL (ref 150–420)
BKR WAM RDW-CV: 13.2 % (ref 11.0–15.0)
BKR WAM RED BLOOD CELL COUNT.: 4.35 M/ÂµL (ref 4.00–6.00)
BKR WAM WHITE BLOOD CELL COUNT: 5.4 x1000/ÂµL (ref 4.0–11.0)

## 2023-04-05 LAB — PHOSPHORUS     (BH GH L LMW YH): BKR PHOSPHORUS: 3.9 mg/dL (ref 2.2–4.5)

## 2023-04-05 LAB — TACROLIMUS LEVEL     (BH GH L LMW YH): BKR TACROLIMUS BLOOD: 6.8 ng/mL

## 2023-04-07 ENCOUNTER — Encounter: Admit: 2023-04-07 | Payer: PRIVATE HEALTH INSURANCE | Primary: Internal Medicine

## 2023-04-07 ENCOUNTER — Encounter: Admit: 2023-04-07 | Payer: PRIVATE HEALTH INSURANCE | Attending: Internal Medicine | Primary: Internal Medicine

## 2023-04-07 DIAGNOSIS — R12 Heartburn: Secondary | ICD-10-CM

## 2023-04-07 DIAGNOSIS — Z940 Kidney transplant status: Secondary | ICD-10-CM

## 2023-04-07 DIAGNOSIS — Z796 Long-term use of immunosuppressant medication: Secondary | ICD-10-CM

## 2023-04-07 NOTE — Progress Notes
 Specialty Pharmacy Medication Dispense Coordination Note SHMIEL MORTON DOB [birth year] was set up for medications to be dispensed by the Specialty Pharmacy. Associated Specialty Program(s): Rx Kidney Transplant Refill Coordination   Row Name 04/07/23 1452   Validate patient using 2 patient identifiers.  Medications Linked to Program tacrolimus (ENVARSUS XR)  Person authorizing delivery? patient  Patient reported number of doses remaining? 7  How many doses has the patient missed since their last refill? 0  Patient/caregiver confirms patient is taking medication appropriately (i.e. dose, route, frequency)? Y  Has the patient been experiencing any side effects? N  Has the patient started or stopped any medications? N  Has the patient developed any new allergies or medical conditions? N  Has the patient had any unplanned doctor appointments, urgent care, ED, or hospital admissions in the last 4 weeks? N  Are there any medications due (specialty or non-specialty) that are not being filled? N  Patient/caregiver authorized copay amount and payment method? Y  Confirmed delivery address and phone number? Y  Does the patient/caregiver have any questions for a pharmacist? Keitha Butte, CPHTPharmacy TechnicianJanuary 23, 20252:53 PMOutpatient Pharmacy Services at [facility] any questions, please call:  [phone]

## 2023-04-08 MED FILL — TACROLIMUS XR 4 MG TABLET,EXTENDED RELEASE 24 HR: 4 mg | ORAL | 30 days supply | Qty: 60 | Fill #9 | Status: CP

## 2023-04-08 MED FILL — TACROLIMUS XR 1 MG TABLET,EXTENDED RELEASE 24 HR: 1 mg | ORAL | 30 days supply | Qty: 30 | Fill #10 | Status: CP

## 2023-04-08 MED FILL — AMLODIPINE 5 MG TABLET: 5 mg | ORAL | 30 days supply | Qty: 30 | Fill #4 | Status: CP

## 2023-04-08 MED FILL — MAGNESIUM OXIDE 400 MG (241.3 MG MAGNESIUM) TABLET: 400 mg (241.3 mg magnesium) | ORAL | 90 days supply | Qty: 360 | Fill #3 | Status: CP

## 2023-04-08 MED FILL — DULAGLUTIDE 3 MG/0.5 ML SUBCUTANEOUS PEN INJECTOR: 3 mg/0.5 mL | SUBCUTANEOUS | 84 days supply | Qty: 6 | Fill #1 | Status: CP

## 2023-04-08 MED FILL — CARVEDILOL IMMEDIATE RELEASE 25 MG TABLET: 25 mg | ORAL | 30 days supply | Qty: 60 | Fill #5 | Status: CP

## 2023-04-08 MED FILL — PREDNISONE 5 MG TABLET: 5 mg | ORAL | 30 days supply | Qty: 30 | Fill #10 | Status: CP

## 2023-04-18 MED ORDER — OMEPRAZOLE 20 MG CAPSULE,DELAYED RELEASE
20 | ORAL_CAPSULE | Freq: Every day | ORAL | 4 refills | 90.00 days | Status: AC
Start: 2023-04-18 — End: ?
  Filled 2023-06-22: qty 90, 90d supply, fill #0

## 2023-04-19 MED FILL — MYCOPHENOLATE MOFETIL 250 MG CAPSULE: 250 mg | ORAL | 40 days supply | Qty: 240 | Fill #3 | Status: CP

## 2023-05-03 ENCOUNTER — Encounter
Admit: 2023-05-03 | Payer: PRIVATE HEALTH INSURANCE | Attending: Student in an Organized Health Care Education/Training Program | Primary: Internal Medicine

## 2023-05-05 ENCOUNTER — Encounter: Admit: 2023-05-05 | Payer: PRIVATE HEALTH INSURANCE | Primary: Internal Medicine

## 2023-05-05 DIAGNOSIS — Z940 Kidney transplant status: Secondary | ICD-10-CM

## 2023-05-05 DIAGNOSIS — Z796 Long-term use of immunosuppressant medication: Secondary | ICD-10-CM

## 2023-05-05 NOTE — Progress Notes
MD unavailable, verbal obtained from Gulcin P APRN, orders placed.

## 2023-05-06 ENCOUNTER — Ambulatory Visit: Admit: 2023-05-06 | Payer: BLUE CROSS/BLUE SHIELD | Attending: Adult Health | Primary: Internal Medicine

## 2023-05-13 ENCOUNTER — Encounter
Admit: 2023-05-13 | Payer: PRIVATE HEALTH INSURANCE | Attending: Student in an Organized Health Care Education/Training Program | Primary: Internal Medicine

## 2023-05-14 ENCOUNTER — Encounter: Admit: 2023-05-14 | Payer: PRIVATE HEALTH INSURANCE | Attending: Gastroenterology | Primary: Internal Medicine

## 2023-05-14 DIAGNOSIS — K635 Polyp of colon: Secondary | ICD-10-CM

## 2023-05-14 DIAGNOSIS — D369 Benign neoplasm, unspecified site: Secondary | ICD-10-CM

## 2023-05-17 ENCOUNTER — Encounter
Admit: 2023-05-17 | Payer: PRIVATE HEALTH INSURANCE | Attending: Student in an Organized Health Care Education/Training Program | Primary: Internal Medicine

## 2023-05-17 ENCOUNTER — Inpatient Hospital Stay: Admit: 2023-05-17 | Discharge: 2023-05-17 | Payer: BLUE CROSS/BLUE SHIELD | Primary: Internal Medicine

## 2023-05-17 VITALS — BP 146/87 | HR 76 | Wt 279.0 lb

## 2023-05-17 DIAGNOSIS — G4733 Obstructive sleep apnea (adult) (pediatric): Secondary | ICD-10-CM

## 2023-05-17 DIAGNOSIS — Z940 Kidney transplant status: Secondary | ICD-10-CM

## 2023-05-17 DIAGNOSIS — R0981 Nasal congestion: Secondary | ICD-10-CM

## 2023-05-17 DIAGNOSIS — Z7960 Long term (current) use of unspecified immunomodulators and immunosuppressants: Principal | ICD-10-CM

## 2023-05-17 LAB — ALBUMIN/CREATININE PANEL, URINE, RANDOM
BKR ALBUMIN, URINE, RANDOM: 685.1 mg/L
BKR ALBUMIN/CREATININE RATIO, URINE, RANDOM: 985.8 mg/g{creat} — ABNORMAL HIGH (ref <30.0)
BKR CREATININE, URINE, RANDOM: 70 mg/dL

## 2023-05-17 LAB — CBC WITH AUTO DIFFERENTIAL
BKR WAM ABSOLUTE IMMATURE GRANULOCYTES.: 0.02 x 1000/ÂµL (ref 0.00–0.30)
BKR WAM ABSOLUTE LYMPHOCYTE COUNT.: 1.09 x 1000/ÂµL (ref 0.60–3.70)
BKR WAM ABSOLUTE NRBC (2 DEC): 0 x 1000/ÂµL (ref 0.00–1.00)
BKR WAM ANC (ABSOLUTE NEUTROPHIL COUNT): 3.97 x 1000/ÂµL (ref 2.00–7.60)
BKR WAM BASOPHIL ABSOLUTE COUNT.: 0.02 x 1000/ÂµL (ref 0.00–1.00)
BKR WAM BASOPHILS: 0.3 % (ref 0.0–1.4)
BKR WAM EOSINOPHIL ABSOLUTE COUNT.: 0.13 x 1000/ÂµL (ref 0.00–1.00)
BKR WAM EOSINOPHILS: 2.2 % (ref 0.0–5.0)
BKR WAM HEMATOCRIT (2 DEC): 46.4 % (ref 38.50–50.00)
BKR WAM HEMOGLOBIN: 14.8 g/dL (ref 13.2–17.1)
BKR WAM IMMATURE GRANULOCYTES: 0.3 % (ref 0.0–1.0)
BKR WAM LYMPHOCYTES: 18.7 % (ref 17.0–50.0)
BKR WAM MCH (PG): 30.5 pg (ref 27.0–33.0)
BKR WAM MCHC: 31.9 g/dL (ref 31.0–36.0)
BKR WAM MCV: 95.5 fL (ref 80.0–100.0)
BKR WAM MONOCYTE ABSOLUTE COUNT.: 0.61 x 1000/ÂµL (ref 0.00–1.00)
BKR WAM MONOCYTES: 10.4 % (ref 4.0–12.0)
BKR WAM MPV: 10.1 fL (ref 8.0–12.0)
BKR WAM NEUTROPHILS: 68.1 % (ref 39.0–72.0)
BKR WAM NUCLEATED RED BLOOD CELLS: 0 % (ref 0.0–1.0)
BKR WAM PLATELETS: 201 x1000/ÂµL (ref 150–420)
BKR WAM RDW-CV: 12.9 % (ref 11.0–15.0)
BKR WAM RED BLOOD CELL COUNT.: 4.86 M/ÂµL (ref 4.00–6.00)
BKR WAM WHITE BLOOD CELL COUNT: 5.8 x1000/ÂµL (ref 4.0–11.0)

## 2023-05-17 LAB — BASIC METABOLIC PANEL
BKR ANION GAP: 11 (ref 7–17)
BKR BLOOD UREA NITROGEN: 15 mg/dL (ref 6–20)
BKR BUN / CREAT RATIO: 8.6 (ref 8.0–23.0)
BKR CALCIUM: 8.4 mg/dL — ABNORMAL LOW (ref 8.8–10.2)
BKR CHLORIDE: 102 mmol/L (ref 98–107)
BKR CO2: 29 mmol/L (ref 20–30)
BKR CREATININE DELTA: 0.04
BKR CREATININE: 1.74 mg/dL — ABNORMAL HIGH (ref 0.40–1.30)
BKR EGFR, CREATININE (CKD-EPI 2021): 44 mL/min/{1.73_m2} — ABNORMAL LOW (ref >=60)
BKR GLUCOSE: 88 mg/dL (ref 70–100)
BKR POTASSIUM: 3.9 mmol/L (ref 3.3–5.3)
BKR SODIUM: 142 mmol/L (ref 136–144)

## 2023-05-17 LAB — BK VIRUS BY PCR, QUANTITATIVE, PLASMA: BKR BKV QUANT RT-PCR, PLASMA: NOT DETECTED

## 2023-05-17 LAB — TACROLIMUS LEVEL     (BH GH L LMW YH): BKR TACROLIMUS BLOOD: 8.4 ng/mL

## 2023-05-17 LAB — MAGNESIUM: BKR MAGNESIUM: 1.7 mg/dL (ref 1.7–2.4)

## 2023-05-17 LAB — PROTEIN, TOTAL W/CREATININE, URINE, RANDOM     (BH GH LMW YH)
BKR CREATININE, URINE, RANDOM: 70 mg/dL
BKR PROTEIN URINE RANDOM: 0.89 g/L
BKR PROTEIN/CREATININE RATIO, URINE, RANDOM: 1.28 mg/mg{creat} — ABNORMAL HIGH (ref <0.10)

## 2023-05-17 LAB — PHOSPHORUS     (BH GH L LMW YH): BKR PHOSPHORUS: 4.5 mg/dL (ref 2.2–4.5)

## 2023-05-17 MED ORDER — FLUTICASONE PROPIONATE 50 MCG/ACTUATION NASAL SPRAY,SUSPENSION
50 | Freq: Every day | NASAL | 12 refills | 30.00 days | Status: AC
Start: 2023-05-17 — End: ?
  Filled 2023-05-18: qty 16, 30d supply, fill #0

## 2023-05-17 MED ORDER — SODIUM CHLORIDE 0.65 % NASAL SPRAY AEROSOL
0.65 | Freq: Every evening | NASAL | 13 refills | 30.00 days | Status: AC
Start: 2023-05-17 — End: ?

## 2023-05-17 NOTE — Progress Notes
 [facility] OF MEDICINE                                                                              Department of SurgeryDivision of Otolaryngology - Head and Neck SurgeryNAME: Adam Basilio SantiagoDATE OF BIRTH: 05-07-1962DATE OF VISIT: 3/4/2025CHIEF COMPLAINT: No chief complaint on file.HISTORY OF PRESENT ILLNESS: Adam Harper is referred by Dr Gloris Ham for consultation regarding evaluation of OSA on CPAP, nasal congestionIn person Spanish interpreter usedOn CPAP, but needs new supplies and hasn't usedPatient is congested every day, left side is better side, right is always congestedThe patient?s complete past medical, family, and social history have been reviewed and are as noted in the electronic record.REVIEW OF SYSTEMS:  A twelve system questionnaire was completed by the patient and reviewed with them.  Pertinent positives and negatives are noted in the history above.PAST MEDICAL HISTORY:  Past Medical History: Diagnosis Date  A-V fistula (HC Code)   right arm  Anemia   Anemia in ESRD (end-stage renal disease) (HC Code) 10/24/2014  Formatting of this note might be different from the original. 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) (HC CODE) (HC Code)   Mon-Wed-Fri  GERD (gastroesophageal reflux disease)   Hypercholesteremia   Hyperparathyroidism (HC Code)   Hypertension   Infected prosthetic vascular graft, initial encounter (HC Code) (HC CODE) (HC Code) 10/24/2014  Influenza A   Morbid obesity (HC Code) 02/03/2018  Obstructive sleep apnea   pt uses CPAP  Secondary hyperparathyroidism of renal origin (HC Code)   Thoracic aortic aneurysm (HC Code)  PAST SURGICAL HISTORY:  Past Surgical History: Procedure Laterality Date  AV FISTULA PLACEMENT Left   left lower arm unsuccessful, then moved to upper arm 2011, then had problems in 10/2013 - infection MEDICATIONS:   Current Outpatient Medications on File Prior to Visit Medication Sig Dispense Refill  amLODIPine (NORVASC) 5 mg tablet Take 1 tablet (5 mg total) by mouth daily. 30 tablet 11  atorvastatin (LIPITOR) 40 mg tablet Take 1 tablet (40 mg total) by mouth daily. 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 Please dispense supplies covered by patient's insurance. 100 each 3  carvediloL (COREG) 25 mg Immediate Release tablet Take 1 tablet (25 mg total) by mouth 2 (two) times daily with breakfast and dinner. 60 tablet 11  dulaglutide (TRULICITY) 3 mg/0.5 mL Pen Injector Inject 0.5 mLs (3 mg total) under the skin every 7 days. 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 Please dispense supplies covered by patient's insurance. 100 each 3  lidocaine (LIDODERM) 5 % Place 1 patch onto the skin every 24 hours. Remove & Discard patch within 12 hours or as directed by MD 30 patch 0  losartan (COZAAR) 100 mg tablet Take 1 tablet (100 mg total) by mouth daily. 90 tablet 3  magnesium oxide (MAG-OX) 400 mg (241.3 mg magnesium) tablet Take 2 tablets (800 mg total) by mouth 2 (two) times daily. 360 tablet 3  mycophenolate mofetil (CELLCEPT) 250 mg capsule Take 3 capsules (750 mg total) by mouth 2 (two) times daily. 240 capsule 11  omeprazole (PRILOSEC) 20 mg capsule Take 1 capsule (20 mg total) by mouth daily. 90 capsule 3  predniSONE (DELTASONE) 5 mg tablet Take 1 tablet (5 mg total) by mouth daily. 30 tablet 11  tacrolimus XR (ENVARSUS XR) 1 mg 24 hr tablet Take 1 tablet (1 mg total) by mouth daily. Take with 4 mg tabs for total dose 9 mg daily 30 tablet 11  tacrolimus XR (ENVARSUS XR) 4 mg 24 hr tablet Take 2 tablets (8 mg total) by mouth daily. Take with 1 mg tabs for total dose 9 mg daily 60 tablet 11  tadalafiL (CIALIS) 20 mg tablet Take 1 tablet (20 mg total) by mouth every other day as needed for erectile dysfunction. (Patient not taking: Reported on 12/07/2022) 10 tablet 3  [DISCONTINUED] chlorthalidone (HYGROTEN) 50 mg tablet Take 1 tablet (50 mg total) by mouth daily. (Patient not taking: Reported on 05/28/2022) 90 tablet 3 No current facility-administered medications on file prior to visit. ALLERGIES:   Patient has no known allergies.SOCIAL HISTORY:  Social History Socioeconomic History  Marital status: Married   Spouse name: Not on file  Number of children: Not on file  Years of education: Not on file  Highest education level: Not on file Occupational History  Not on file Tobacco Use  Smoking status: Former   Current packs/day: 0.25   Average packs/day: 0.3 packs/day for 25.0 years (6.3 ttl pk-yrs)   Types: Cigarettes  Smokeless tobacco: Never Vaping Use  Vaping status: Never Used Substance and Sexual Activity  Alcohol use: No  Drug use: No  Sexual activity: Yes   Partners: Female Other Topics Concern  Not on file Social History Narrative  Not on file Social Drivers of Health Financial Resource Strain: Not on file Food Insecurity: Not on file Transportation Needs: Not on file Physical Activity: Not on file Stress: Not on file Social Connections: Not on file Intimate Partner Violence: Not on file Housing Stability: Not on file FAMILY HISTORY:  No family history on file.ENT PHYSICAL EXAMINATION: Vitals: Vitals:  05/17/23 0918 BP: (!) 146/87 Pulse: 76 Constitutional:  Well-developed and well-nourished and in no apparent distress.  Voice: Clear, strong.Head and Face:  No cutaneous lesions of the head or face. Face symmetric with movement and at rest, sensation intact bilaterallyEyes:  Extraocular movements were intact without nystagmus.   Ears:  Bilateral - Auricles normal. External auditory canals clear. Tympanic membranes flat without any evidence of retraction or effusion. Nose:  External nose without abnormality. Anterior rhinoscopy demonstrated a midline nasal septum with normal turbinates.  Oral Cavity/Oropharynx: Oral cavity without masses/lesions/mucosal abnormality. Teeth and gums unremarkable. Floor of mouth and oral tongue were soft. Tongue was fully mobile and protruded midline. No masses/lesions/mucosal abnormalities of the oropharynx. Mallampati IV, tonsils 1+ very enlarged uvula, macroglossiaNeck/Lymphatic: No palpable lymph nodes or masses in the lateral neck or central compartment. No thyroid masses palpated. No palpable parotid or submandibular salivary gland masses. Respiratory: Breathing comfortably, non-labored. No stridor.Neurologic: Alert, oriented. Mood and affect normal. Cranial nerves II-XII intact bilaterally except as noted.PROCEDURE:Rigid Nasal Endoscopy (CPT Code 16109) 05/17/2023 Verbal consent obtained. After topical decongestant and anesthesia  with Afrin and lidocaine sprays, a 0-degree rigid nasal endoscopy was performed. In the left nasal cavity, the middle meatus, superior meatus, sphenoethmoid recess and olfactory cleft are clear. The turbinates are hypertrophic . No polyps or pus are seen. In the right nasal cavity, the middle meatus, superior meatus, sphenoethmoid recess and olfactory cleft are clear. No polyps or pus are seen. The turbinates are hypertrophicBilateral eustachian tube orifices patentAdenoids mild hypertrophySeptum deviated to the right due to prominent septal spur DATA REVIEW:  Per Dr Atilano Ina note:Maximum A Wegner was diagnosed with OSA 6.5 years ago (AHI of 67/hr) and treated with APAP 12-18 cm of water.His unit was switched to Dream Station 2 under the recall.  His DME is [employer]. He has M/L Brevida mask.Data was downloaded from the machine. He used it 28/30 times for 6 hours and 32 minutes on average at night with overall compliance of 93.3%.  AHI is down to 3.1 events per hour of sleep. His mask  is not leaking.  I checked the device and it  is working well.  The unit is set at 12-18 cm of water and he is using  His equipment was provided by [employer] DME. IMPRESSION:  Adam Harper is a 63 y.o. male with severe OSA presents with chronic rhinitis and deviated nasal septumRECOMMENDATIONS:  -Saline nasal sprays/irrigations twice daily-Flonase 2 sprays each nostril daily at least 4-6 weeks, proper nozzle use discussed -Strongly advised nightly CPAP use-Briefly discussed septoplasty and bilateral inferior turbinate reduction to improve nasal CPAP use-Return to clinic in 2-3 monthsThe patient knows to contact me at any point with questions or concerns.Lavonna Rua, MDDivision of Otolaryngology - Head and Neck SurgeryGreater than a total of 30 minutes was personally spent by me on tasks that may have included reviewing tests, preparing for visit, obtaining a history, examining the patient, counseling/education, ordering tests/treatments/medications, communicating with other health care providers, coordinating care, and completing health record documentation.  This does not include any resident/fellow teaching time, or any time spent performing a procedural service.

## 2023-05-17 NOTE — Progress Notes
 The following is the transcribed Review of Systems completed by the patient at intake.Review of Systems HENT:  Positive for congestion.       Sleep apnea All other systems reviewed and are negative.

## 2023-05-18 ENCOUNTER — Telehealth: Admit: 2023-05-18 | Payer: PRIVATE HEALTH INSURANCE | Primary: Internal Medicine

## 2023-05-18 MED FILL — AMLODIPINE 5 MG TABLET: 5 mg | ORAL | 30 days supply | Qty: 30 | Fill #5 | Status: CP

## 2023-05-18 MED FILL — PREDNISONE 5 MG TABLET: 5 mg | ORAL | 30 days supply | Qty: 30 | Fill #11 | Status: CP

## 2023-05-18 MED FILL — TACROLIMUS XR 1 MG TABLET,EXTENDED RELEASE 24 HR: 1 mg | ORAL | 30 days supply | Qty: 30 | Fill #11 | Status: CP

## 2023-05-18 MED FILL — CARVEDILOL IMMEDIATE RELEASE 25 MG TABLET: 25 mg | ORAL | 30 days supply | Qty: 60 | Fill #6 | Status: CP

## 2023-05-18 MED FILL — TACROLIMUS XR 4 MG TABLET,EXTENDED RELEASE 24 HR: 4 mg | ORAL | 30 days supply | Qty: 60 | Fill #10 | Status: CP

## 2023-05-18 MED FILL — MYCOPHENOLATE MOFETIL 250 MG CAPSULE: 250 mg | ORAL | 40 days supply | Qty: 240 | Fill #4 | Status: CP

## 2023-05-18 NOTE — Telephone Encounter
 LMX1 for pt to contact are office to schedule his procedure

## 2023-05-19 ENCOUNTER — Telehealth: Admit: 2023-05-19 | Payer: PRIVATE HEALTH INSURANCE | Primary: Internal Medicine

## 2023-05-19 ENCOUNTER — Encounter: Admit: 2023-05-19 | Payer: PRIVATE HEALTH INSURANCE | Primary: Internal Medicine

## 2023-05-19 NOTE — Telephone Encounter
 [facility] for pt to contract are office to schedule his procedure

## 2023-05-24 ENCOUNTER — Ambulatory Visit: Admit: 2023-05-24 | Payer: BLUE CROSS/BLUE SHIELD | Primary: Internal Medicine

## 2023-05-24 VITALS — BP 138/80 | HR 72 | Temp 97.80000°F | Ht 68.0 in | Wt 281.9 lb

## 2023-05-24 DIAGNOSIS — I770 Arteriovenous fistula, acquired: Secondary | ICD-10-CM

## 2023-05-24 DIAGNOSIS — Z940 Kidney transplant status: Secondary | ICD-10-CM

## 2023-05-24 DIAGNOSIS — Z7960 Long term (current) use of unspecified immunomodulators and immunosuppressants: Secondary | ICD-10-CM

## 2023-05-24 NOTE — Progress Notes
 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 Bactrim switched to AtovaquoneIntermittent pre-syncopal events, holter monitoring.Stent removal 9/15/20213/22: Biopsy: ACUTE CELLULAR REJECTION, BANFF TYPE IA 10/22: Biopsy: ACUTE TUBULAR INJURY, FOCAL9/30/2024: NO EVIDENCE OF ACUTE REJECTION                FOCAL AND SEGMENTAL GLOMERULOSCLEROSIS                ACUTE TUBULAR INJURY, DIFFUSE History of present illness: Adam Harper does not report symptoms referable to his transplanted kidney.Presents to clinic with his wife today.  He reports feeling fatigued during the day.  Reports he is not using his CPAP at night.  He  reports compliance with his immunosuppressive medications and does not report drug related side effects.He denies specifically, fevers, diarrhea, dysuria, urinary urgency and pain over he allograft.Spanish interpretor: [id number]_______PFSH reviewed and confirmed 3/11/2025Review of Systems In addition he reportsReview of Systems Constitutional:  Positive for malaise/fatigue. Negative for chills, fever and weight loss. Respiratory: Negative.   Cardiovascular: Negative.  Gastrointestinal: Negative.  Genitourinary: Negative.  Physical Exam  In general Adam Harper looks well and is in no distress.  he is alert, oriented and fully conversant. Vitals: BP 138/80  - Pulse 72  - Temp 97.8 ?F (36.6 ?C) (Temporal)  - Ht 5' 8 (1.727 m)  - Wt 127.9 kg  - SpO2 97%  - BMI 42.86 kg/m?  Wt Readings from Last 4 Encounters: 05/24/23 127.9 kg 05/17/23 126.6 kg 02/01/23 126.6 kg 12/30/22 125 kg   Physical ExamVitals reviewed. Constitutional:     Appearance: He is obese. Pulmonary:    Effort: Pulmonary effort is normal. Abdominal:    General: There is no distension.    Tenderness: There is no abdominal tenderness. Musculoskeletal:    Right lower leg: No edema.    Left lower leg: No edema. Neurological:    Mental Status: He is alert. Psychiatric:       Mood and Affect: Mood normal.  Assessment / Plan Transplanted Allograft The most recent serum creatinine is Lab Results Component Value Date  CREATININE 1.74 (H) 05/17/2023 Recent albumin/creatinine ratio is Lab Results Component Value Date  ALBCREATR 985.8 (H) 05/17/2023  and recent protein/creatinine ratio is Lab Results Component Value Date  PRCRRAU 1.28 (H) 05/17/2023 .Immunosuppression Management The the most recent tacrolimus level for today's visit is Lab Results Component Value Date  TACROLIMUS 8.4 05/17/2023 Immunosuppression     Start End  mycophenolate mofetil (CELLCEPT) 250 mg capsule 11/17/2022 --  Sig - Route: Take 3 capsules (750 mg total) by mouth 2 (two) times daily. - Oral  Notes to Pharmacy: Kidney Transplant Z94.0.  Prior authorization: Approved  predniSONE (DELTASONE) 5 mg tablet 06/21/2022 --  Sig - Route: Take 1 tablet (5 mg total) by mouth daily. - Oral  Notes to Pharmacy: Kidney Transplant Z94.0  tacrolimus XR (ENVARSUS XR) 1 mg 24 hr tablet 07/13/2022 07/15/2023  Sig - Route: Take 1 tablet (1 mg total) by mouth daily. Take with 4 mg tabs for total dose 9 mg daily - Oral  Notes to Pharmacy: Kidney txp date: 10/16/2019  DX Code: Z94.0  Prior authorization: Approved  tacrolimus XR (ENVARSUS XR) 4 mg 24  hr tablet 07/08/2022 --  Sig - Route: Take 2 tablets (8 mg total) by mouth daily. Take with 1 mg tabs for total dose 9 mg daily - Oral  Prior authorization: Approved Blood Pressure ManagementToday's blood pressure is BP Readings from Last 2 Encounters: 05/24/23 138/80 05/17/23 (!) 146/87 . No change to current regimen.Antihypertensives     Start End  amLODIPine (NORVASC) 5 mg tablet 12/07/2022 --  Sig - Route: Take 1 tablet (5 mg total) by mouth daily. - Oral  carvediloL (COREG) 25 mg Immediate Release tablet 11/19/2022 11/19/2023  Sig - Route: Take 1 tablet (25 mg total) by mouth 2 (two) times daily with breakfast and dinner. - Oral  losartan (COZAAR) 100 mg tablet 12/31/2022 --  Sig - Route: Take 1 tablet (100 mg total) by mouth daily. - Oral  Prophylaxis Current prophylactic Medications EXB:[id number] Prophylaxis   None  HematologyMost recent WBC is Lab Results Component Value Date  WBC 5.8 05/17/2023  and most recent hemoglobin is Lab Results Component Value Date  HGB 14.8 05/17/2023 .  No active issues.Viral monitoring Adam Harper is Lab Results Component Value Date  CYTOMEGALOVI Positive (A) 10/02/2019 Most recent BK viral testing -- BK Virus Quantitative PCR (no units) Date Value 05/17/2023 Not Detected 05/27/2022 Not Detected Most recent CMV viral testing -- Cytomegalovirus Quantitative PCR (no units) Date Value 01/19/2021 Not Detected ElectrolytesMost recent magnesium is Lab Results Component Value Date  MG 1.7 05/17/2023 , most recent calcium is  Lab Results Component Value Date  CALCIUM 8.4 (L) 05/17/2023  and most recent phosphorus is Lab Results Component Value Date  PHOS 4.5 05/17/2023 .Summary of Plan:Renal function stable at baseline.Electrolytes wnlImmunosuppression- no change.  Tac trough level at higher end but within goal for 5-8Blood pressure at goal todaySleep apnea- discussed risks/benefits of compliance with CPAP.  He is followed by sleep medicine and ENT. Encouraged him to wear the CPAP as prescribed.Rtc 3 months----Electronically Signed by Rosezella [place]. Caryn Section, APRN, May 24, 2023 Please note: MModal speech recognition software was used to compose this note. Notes are reviewed for accuracy but unintended translational errors can occur. Please contact us if there are any questions about the content of this note.

## 2023-05-26 IMAGING — MR MRI BRAIN WITHOUT CONTRAST
8 of 11 series · 24 of 48 positions shown · IV contrast (gadolinium)
Comparison: None

________________________________________________________________________________________________ 
MRI BRAIN WITHOUT CONTRAST, 05/26/2023 [DATE]: 
CLINICAL INDICATION: Homonymous Bilateral Field Defects , traumatic brain injury 
in 5119. Migraine headache.
TECHNIQUE: Multiplanar, multiecho position MR images of the brain were performed 
without intravenous gadolinium enhancement. Patient was scanned on a
magnet.

[Series 102: mpr - smartbrain · axial · 1.1mm · 1.09mm/px · 1 of 2 slices shown]
[im 1/2]
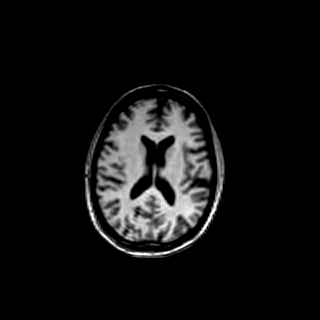

[Series 203: dadc map · axial · 5.0mm · 1.00mm/px · z∈[-109,+46]mm · 2 of 26 slices shown (1 of 2)]
[im 1/26]
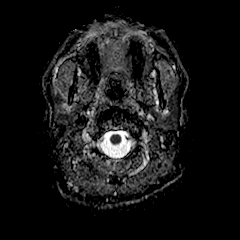
[im 26/26]
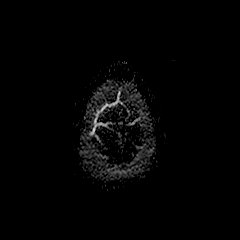

[Series 204: (id) · axial · 5.0mm · 1.00mm/px · z∈[-109,+46]mm · 2 of 27 slices shown (1 of 2)]
[im 1/27]
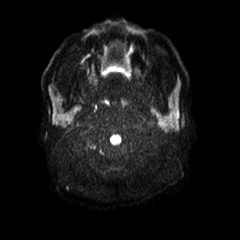
[im 27/27]
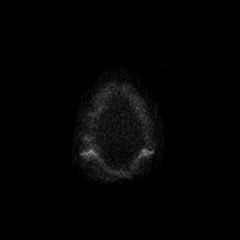

[Series 303: dadc map · coronal · 5.0mm · 0.81mm/px · 3 of 31 slices shown (2 of 2)]
[im 1/31]
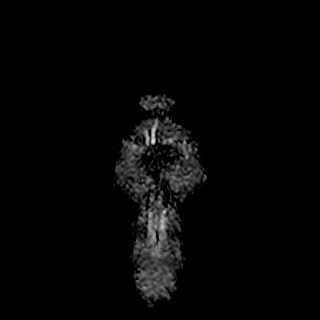
[im 16/31]
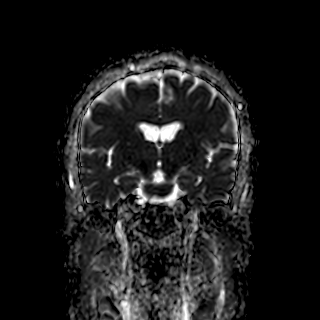
[im 31/31]
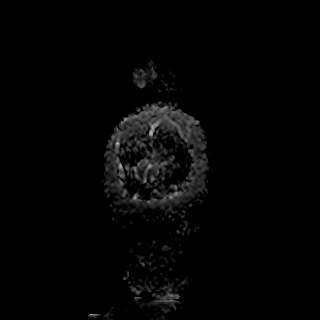

[Series 304: (id) · coronal · 5.0mm · 0.81mm/px · 3 of 31 slices shown (2 of 2)]
[im 1/31]
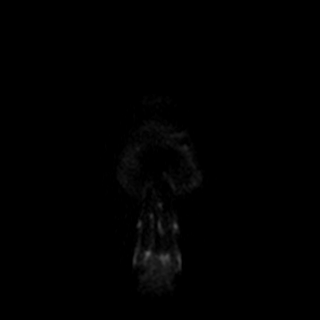
[im 16/31]
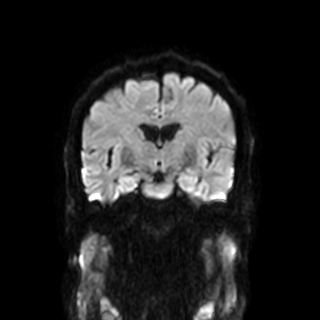
[im 31/31]
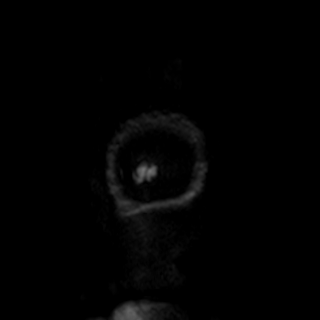

[Series 401: t1_se_sag · sagittal · 4.0mm · 0.43mm/px · 3 of 28 slices shown]
[im 1/28]
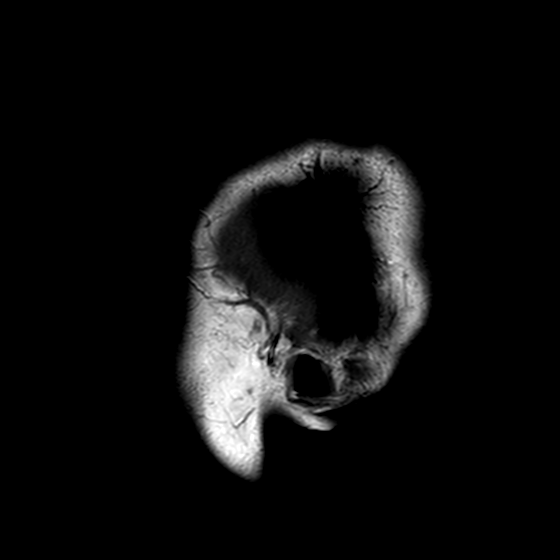
[im 14/28]
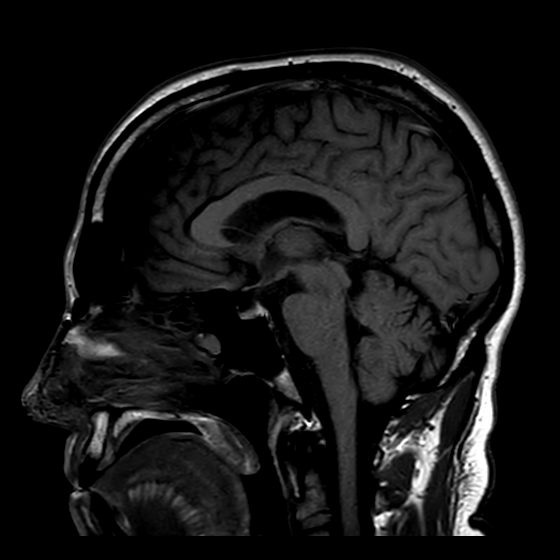
[im 28/28]
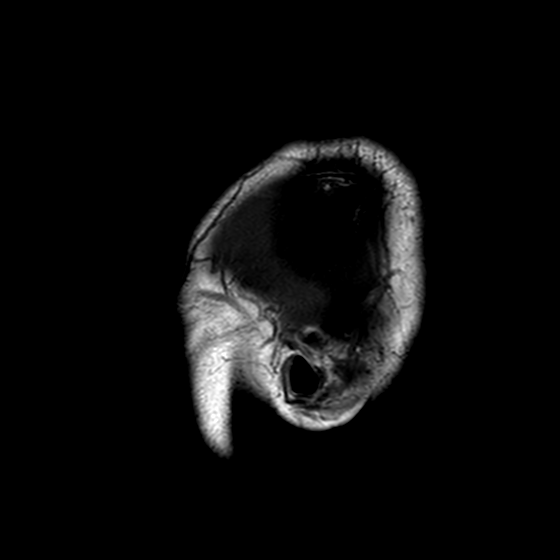

[Series 501: flair_ax+fs · axial · 5.0mm · 0.49mm/px · z∈[-106,+49]mm · 3 of 27 slices shown]
[im 1/27]
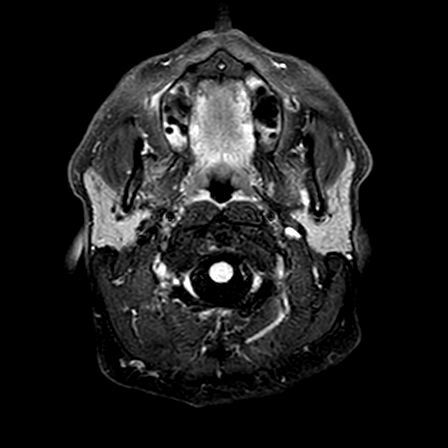
[im 14/27]
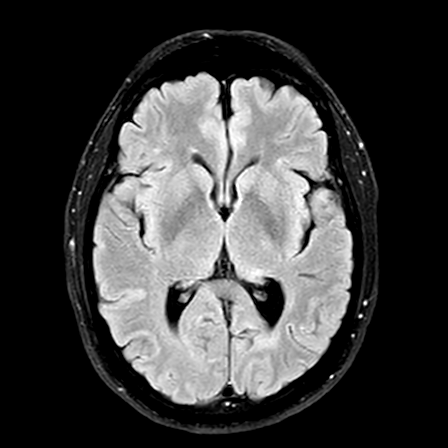
[im 27/27]
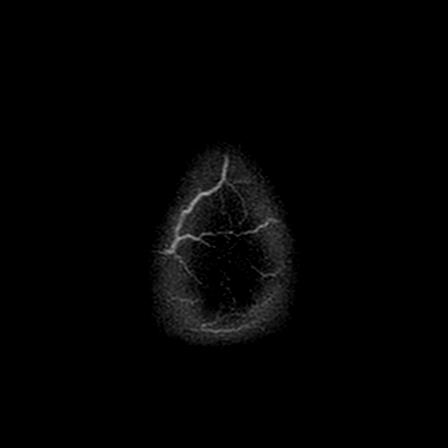

[Series 602: swip · axial · 10.0mm · 0.36mm/px · z∈[-92,+24]mm · 7 of 140 slices shown]
[im 1/140]
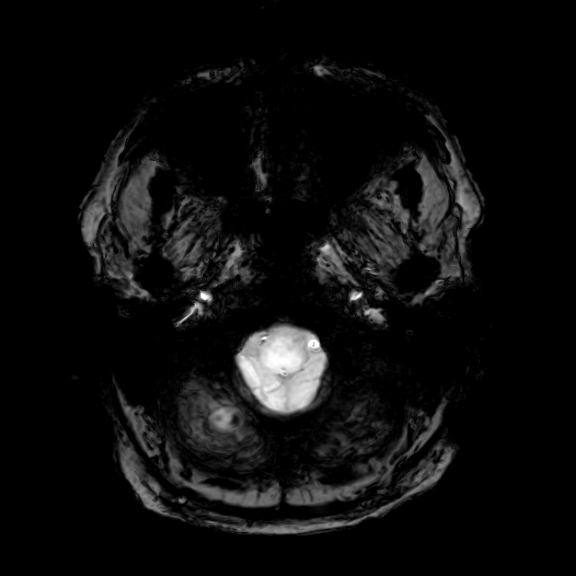
[im 22/140]
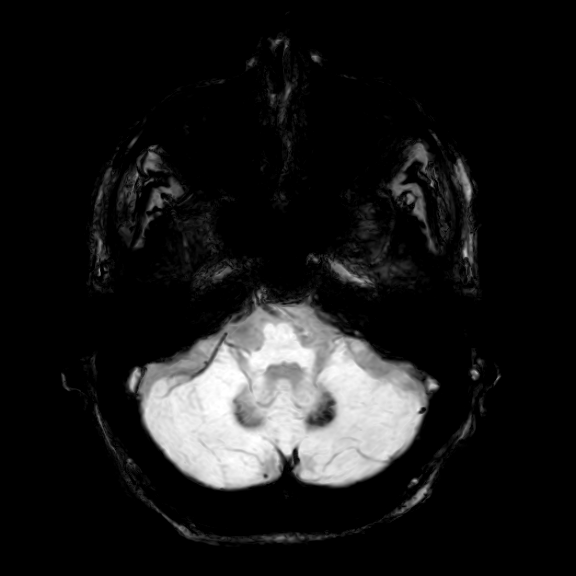
[im 43/140]
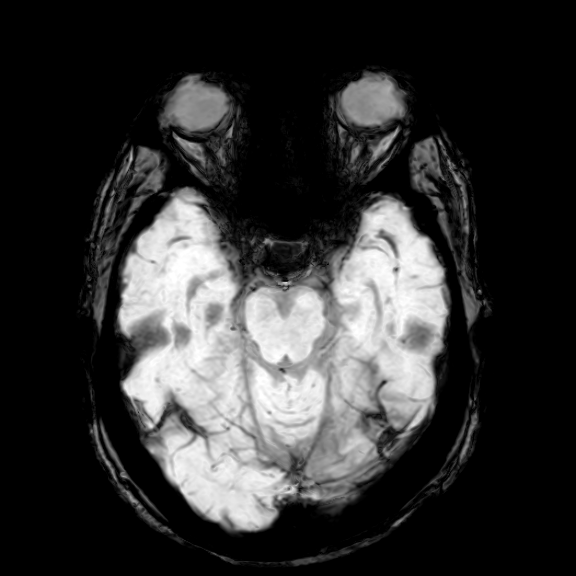
[im 65/140]
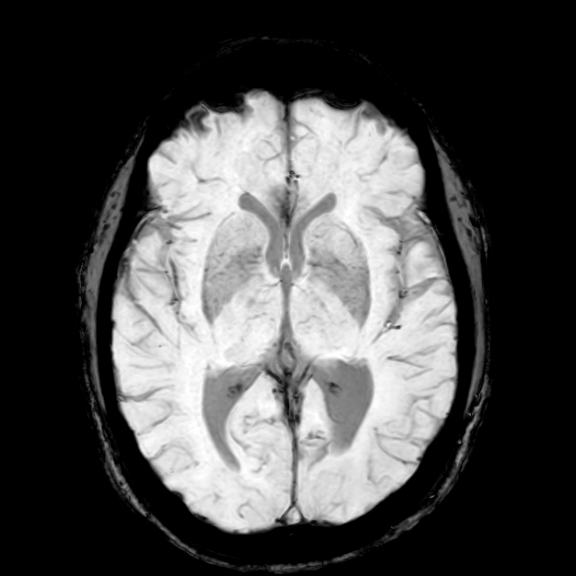
[im 75/140]
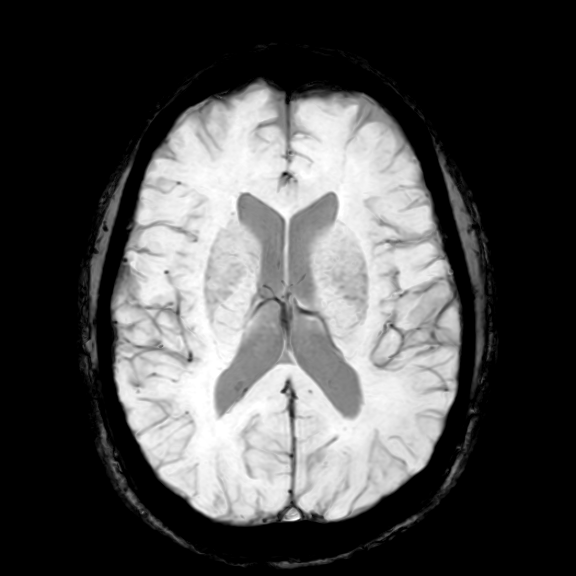
[im 97/140]
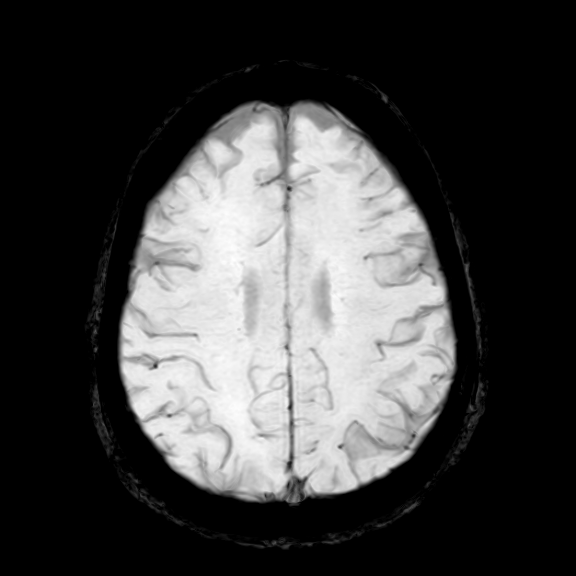
[im 118/140]
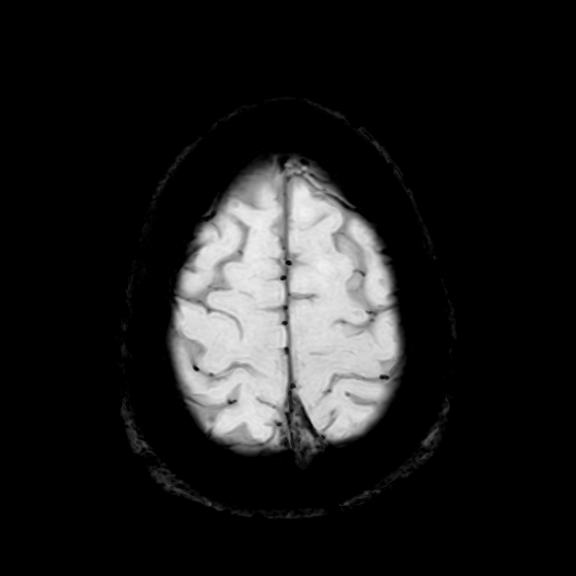

[24 of 48 positions shown; findings below may reference images not displayed]

FINDINGS: -------------------------------------------------------------------------------- 
------------------------- 
INTRACRANIAL: 
Mild nonspecific subcortical, deep white matter, and periventricular T-2 FLAIR 
hyperintensity is likely chronic microangiopathy. There is a small size old 
appearing infarct with encephalomalacia and associated gliosis involving the 
paramedian left occipital lobe. Mild ex vacuo dilatation occipital horn left 
lateral ventricle. No mass or abnormal extra-axial fluid collection. There is 
subtle, increased T2 FLAIR signal within the posterior limb of the internal 
capsules bilaterally involving the anterolateral aspects of the midbrain. 
No acute ischemia. No abnormal foci of susceptibility artifact in the brain. 
Patency of the intracranial vascular flow voids.  No acute intracranial 
hemorrhage, mass effect, midline shift. No large sellar mass. No hydrocephalus. 
Cerebral volume is age appropriate.  
-------------------------------------------------------------------------------- 
----------------------- 
OTHER: 
ORBITS/SINUSES/T-BONES:  Visualized orbits show no acute abnormality or mass.  
Mastoid air cells and middle ear cavities are grossly clear.  Sphenoid sinus 
mucous retention cyst. 
MARROW SIGNAL/SOFT TISSUES: No focal suspect signal abnormality.  
-------------------------------------------------------------------------------- 
-------------------
IMPRESSION: No acute intracranial process. Mild presumed chronic microangiopathy with an old 
paramedian left occipital lobe infarct. Consider follow-up MRA head and neck. 
Subtle increased T2 FLAIR signal is present within the posterior limb of the 
internal capsules bilaterally, in a cortical spinal tract distribution. 
Follow-up brain MRI in 6-12 months is recommended.

## 2023-06-14 ENCOUNTER — Encounter
Admit: 2023-06-14 | Payer: PRIVATE HEALTH INSURANCE | Attending: Student in an Organized Health Care Education/Training Program | Primary: Internal Medicine

## 2023-06-16 ENCOUNTER — Encounter: Admit: 2023-06-16 | Payer: PRIVATE HEALTH INSURANCE | Attending: Vascular Surgery | Primary: Internal Medicine

## 2023-06-16 ENCOUNTER — Ambulatory Visit: Admit: 2023-06-16 | Payer: PRIVATE HEALTH INSURANCE | Attending: Vascular Surgery | Primary: Internal Medicine

## 2023-06-16 VITALS — BP 145/76 | HR 75 | Ht 68.0 in | Wt 279.0 lb

## 2023-06-16 DIAGNOSIS — T827XXA Infection and inflammatory reaction due to other cardiac and vascular devices, implants and grafts, initial encounter: Secondary | ICD-10-CM

## 2023-06-16 DIAGNOSIS — T82858D Stenosis of vascular prosthetic devices, implants and grafts, subsequent encounter: Secondary | ICD-10-CM

## 2023-06-16 DIAGNOSIS — I10 Essential (primary) hypertension: Secondary | ICD-10-CM

## 2023-06-16 DIAGNOSIS — D649 Anemia, unspecified: Secondary | ICD-10-CM

## 2023-06-16 DIAGNOSIS — N051 Unspecified nephritic syndrome with focal and segmental glomerular lesions: Secondary | ICD-10-CM

## 2023-06-16 DIAGNOSIS — N2581 Secondary hyperparathyroidism of renal origin: Secondary | ICD-10-CM

## 2023-06-16 DIAGNOSIS — K219 Gastro-esophageal reflux disease without esophagitis: Secondary | ICD-10-CM

## 2023-06-16 DIAGNOSIS — Z940 Kidney transplant status: Secondary | ICD-10-CM

## 2023-06-16 DIAGNOSIS — N186 End stage renal disease: Secondary | ICD-10-CM

## 2023-06-16 DIAGNOSIS — Z7960 Long term (current) use of unspecified immunomodulators and immunosuppressants: Secondary | ICD-10-CM

## 2023-06-16 DIAGNOSIS — J101 Influenza due to other identified influenza virus with other respiratory manifestations: Secondary | ICD-10-CM

## 2023-06-16 DIAGNOSIS — I712 Thoracic aortic aneurysm (HC Code): Secondary | ICD-10-CM

## 2023-06-16 DIAGNOSIS — I770 Arteriovenous fistula, acquired: Secondary | ICD-10-CM

## 2023-06-16 DIAGNOSIS — E7800 Pure hypercholesterolemia, unspecified: Secondary | ICD-10-CM

## 2023-06-16 DIAGNOSIS — K7460 Unspecified cirrhosis of liver: Secondary | ICD-10-CM

## 2023-06-16 DIAGNOSIS — E213 Hyperparathyroidism, unspecified: Secondary | ICD-10-CM

## 2023-06-16 DIAGNOSIS — G4733 Obstructive sleep apnea (adult) (pediatric): Secondary | ICD-10-CM

## 2023-06-16 NOTE — Progress Notes
 [facility] Follow-up Office NoteSubjective: Chief Complaint:RUE aneurysmal fistulaHPI: Adam Harper is a 63 y.o. male patient who presents to vascular clinic today for RUE aneurysmal fistula.It was a pleasure to see this gentleman today who is presenting with his wife.  He is status post kidney transplant that is working well. Both his nephrologists and somewhat he is concerned that his aneurysmal right upper extremity fistulas starting to grow a bit.  He wants to know if there are surgical options or if this is a dangerous.Objective: Physical Exam:Constitutional: No apparent distress.  Vital Signs: BP (!) 145/76 (Site: l a, Position: Sitting, Cuff Size: Large)  - Pulse 75  - Ht 5' 8 (1.727 m)  - Wt 126.6 kg  - SpO2 96%  - BMI 42.42 kg/m?  Neuro:  Alert and oriented to person, place, time, and situation. HEENT:  Sclerae anicteric.  Extraocular muscles intact.  No carotid bruits.  Resp:  Breathing comfortable.  Musculoskeletal:  Ambulates normalGI:  Normal bowel sounds.  Abdomen soft and non-tender.  No palpable masses nor organomegaly.  No bruits.Extremities:  No clubbing, cyanosis, nor edema.  Capillary refill less than 2 seconds.  Vascular:  Diagnostics:IMPRESSION/PLAN: PRINTICE Harper is a 63 y.o. male patient with RUE aneurysmal fistula.His skin is pristine.  There is absolutely no ulceration.  He has no symptoms or any pain from this. I am going to get a dialysis access ultrasound.  We will be able to compare it to prior to see if this is in fact growing.  Really, I think the main indication for any surgery would be if there is a pseudoaneurysm there, but I am not sure if present represents a dangerous entity. We will see him in followup. Thank you for involving me in his care.Cc:  Electronically Signed by Georgiann Mohs, MD, April 3, 2025[facility] of Medicine, Section of Vascular [address], [address] , [place] [address] [place], [place] [address]UXLKGM: [phone]: [phone] Office Note

## 2023-06-17 MED ORDER — PREDNISONE 5 MG TABLET
5 | ORAL_TABLET | Freq: Every day | ORAL | 12 refills | 30.00 days | Status: AC
Start: 2023-06-17 — End: ?
  Filled 2023-06-22: qty 30, 30d supply, fill #0

## 2023-06-20 ENCOUNTER — Encounter
Admit: 2023-06-20 | Payer: PRIVATE HEALTH INSURANCE | Attending: Student in an Organized Health Care Education/Training Program | Primary: Internal Medicine

## 2023-06-21 MED ORDER — MAGNESIUM OXIDE 400 MG (241.3 MG MAGNESIUM) TABLET
400 | ORAL_TABLET | Freq: Two times a day (BID) | ORAL | 4 refills | 90.00 days | Status: AC
Start: 2023-06-21 — End: ?

## 2023-06-21 MED ORDER — TACROLIMUS XR 1 MG TABLET,EXTENDED RELEASE 24 HR
1 | ORAL_TABLET | Freq: Every day | ORAL | 12 refills | 30.00 days | Status: AC
Start: 2023-06-21 — End: ?
  Filled 2023-06-22: qty 30, 30d supply, fill #0

## 2023-06-22 MED FILL — AMLODIPINE 5 MG TABLET: 5 mg | ORAL | 30 days supply | Qty: 30 | Fill #6 | Status: CP

## 2023-06-22 MED FILL — TACROLIMUS XR 4 MG TABLET,EXTENDED RELEASE 24 HR: 4 mg | ORAL | 30 days supply | Qty: 60 | Fill #11 | Status: CP

## 2023-06-22 MED FILL — FLUTICASONE PROPIONATE 50 MCG/ACTUATION NASAL SPRAY,SUSPENSION: 50 mcg/actuation | NASAL | 30 days supply | Qty: 16 | Fill #1 | Status: CP

## 2023-06-22 MED FILL — ATORVASTATIN 40 MG TABLET: 40 mg | ORAL | 90 days supply | Qty: 90 | Fill #1 | Status: CP

## 2023-06-22 MED FILL — DULAGLUTIDE 3 MG/0.5 ML SUBCUTANEOUS PEN INJECTOR: 3 mg/0.5 mL | SUBCUTANEOUS | 84 days supply | Qty: 6 | Fill #2 | Status: CP

## 2023-06-22 MED FILL — LOSARTAN 100 MG TABLET: 100 mg | ORAL | 90 days supply | Qty: 90 | Fill #2 | Status: CP

## 2023-06-22 MED FILL — MYCOPHENOLATE MOFETIL 250 MG CAPSULE: 250 mg | ORAL | 40 days supply | Qty: 240 | Fill #5 | Status: CP

## 2023-06-22 MED FILL — CARVEDILOL IMMEDIATE RELEASE 25 MG TABLET: 25 mg | ORAL | 30 days supply | Qty: 60 | Fill #7 | Status: CP

## 2023-06-23 ENCOUNTER — Ambulatory Visit: Admit: 2023-06-23 | Payer: PRIVATE HEALTH INSURANCE | Primary: Internal Medicine

## 2023-06-23 ENCOUNTER — Ambulatory Visit: Admit: 2023-06-23 | Payer: PRIVATE HEALTH INSURANCE | Attending: Vascular Surgery | Primary: Internal Medicine

## 2023-06-28 ENCOUNTER — Inpatient Hospital Stay: Admit: 2023-06-28 | Discharge: 2023-06-28 | Payer: PRIVATE HEALTH INSURANCE | Primary: Internal Medicine

## 2023-06-28 ENCOUNTER — Inpatient Hospital Stay: Admit: 2023-06-28 | Payer: PRIVATE HEALTH INSURANCE | Primary: Internal Medicine

## 2023-06-28 DIAGNOSIS — T82858D Stenosis of vascular prosthetic devices, implants and grafts, subsequent encounter: Secondary | ICD-10-CM

## 2023-06-30 ENCOUNTER — Ambulatory Visit: Admit: 2023-06-30 | Payer: PRIVATE HEALTH INSURANCE | Attending: Vascular Surgery | Primary: Internal Medicine

## 2023-07-19 ENCOUNTER — Encounter
Admit: 2023-07-19 | Payer: PRIVATE HEALTH INSURANCE | Attending: Student in an Organized Health Care Education/Training Program | Primary: Internal Medicine

## 2023-07-20 MED ORDER — TACROLIMUS XR 4 MG TABLET,EXTENDED RELEASE 24 HR
4 | ORAL_TABLET | Freq: Every day | ORAL | 12 refills | 30.00000 days | Status: AC
Start: 2023-07-20 — End: ?
  Filled 2023-07-21: qty 60, 30d supply, fill #0

## 2023-07-21 MED FILL — PREDNISONE 5 MG TABLET: 5 mg | ORAL | 30 days supply | Qty: 30 | Fill #1 | Status: CP

## 2023-07-21 MED FILL — TACROLIMUS XR 1 MG TABLET,EXTENDED RELEASE 24 HR: 1 mg | ORAL | 30 days supply | Qty: 30 | Fill #1 | Status: CP

## 2023-07-21 MED FILL — FLUTICASONE PROPIONATE 50 MCG/ACTUATION NASAL SPRAY,SUSPENSION: 50 mcg/actuation | NASAL | 30 days supply | Qty: 16 | Fill #2 | Status: CP

## 2023-07-21 MED FILL — CARVEDILOL IMMEDIATE RELEASE 25 MG TABLET: 25 mg | ORAL | 30 days supply | Qty: 60 | Fill #8 | Status: CP

## 2023-07-21 MED FILL — AMLODIPINE 5 MG TABLET: 5 mg | ORAL | 30 days supply | Qty: 30 | Fill #7 | Status: CP

## 2023-08-01 ENCOUNTER — Encounter: Admit: 2023-08-01 | Payer: PRIVATE HEALTH INSURANCE | Attending: Gastroenterology | Primary: Internal Medicine

## 2023-08-01 NOTE — Progress Notes
 SOCIAL WORK NOTEPatient Name: Adam Harper Record Number: [id number] Date of Birth: 1962-01-27Medical Social Work Follow Up  [employer] Most Recent Value Admission Information  Document Type Voice-mail message Source of Information Medical Team Record Reviewed Yes Level of Care Ambulatory What medium(s) of communication were used with patient/family/caregiver? Telephone Psychosocial issues requiring intervention 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 Psychosocial interventions With the assistance of Spanish speaking Interpreter Cato Cockayne [phone]) a voicemail message was left for [name] requesting he call me directly to discuss the above request. Next Steps/Plan (including hand-off): I await a return call from [name]. Signature: Lewis Red, [place] Transplant Social [facility] Transplantation CenterCell:  [phone]  Contact Information: [phone]

## 2023-08-02 ENCOUNTER — Encounter: Admit: 2023-08-02 | Payer: PRIVATE HEALTH INSURANCE | Attending: Gastroenterology | Primary: Internal Medicine

## 2023-08-02 NOTE — Progress Notes
 SOCIAL WORK NOTEPatient Name: TARO HIDROGO Record Number: [id number] Date of Birth: [birth year]Medical Social Work Follow Up  [employer] Most Recent Value Admission Information  Document Type Voice-mail message Source of Information Medical Team Record Reviewed Yes Level of Care Ambulatory What medium(s) of communication were used with patient/family/caregiver? Telephone Psychosocial issues requiring intervention Mr. 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, and was referred to Social Work. Psychosocial interventions Because I have not yet heard from Mr. Garnet Just, I left him another voicemail message today with the assistance of Spanish speaking Interpreter Angelica [phone]) requesting he call me directly to discuss the above request. Next Steps/Plan (including hand-off): I await a return call from [name]. Signature: Lewis Red, [place] Transplant Social [facility] Trans[facility] CenterCell:  [phone]  Contact Information: [phone]

## 2023-08-15 MED FILL — TACROLIMUS XR 4 MG TABLET,EXTENDED RELEASE 24 HR: 4 mg | ORAL | 30 days supply | Qty: 60 | Fill #1 | Status: CP

## 2023-08-15 MED FILL — FLUTICASONE PROPIONATE 50 MCG/ACTUATION NASAL SPRAY,SUSPENSION: 50 mcg/actuation | NASAL | 30 days supply | Qty: 16 | Fill #3 | Status: CP

## 2023-08-15 MED FILL — PREDNISONE 5 MG TABLET: 5 mg | ORAL | 30 days supply | Qty: 30 | Fill #2 | Status: CP

## 2023-08-15 MED FILL — TACROLIMUS XR 1 MG TABLET,EXTENDED RELEASE 24 HR: 1 mg | ORAL | 30 days supply | Qty: 30 | Fill #2 | Status: CP

## 2023-08-15 MED FILL — AMLODIPINE 5 MG TABLET: 5 mg | ORAL | 30 days supply | Qty: 30 | Fill #8 | Status: CP

## 2023-08-15 MED FILL — CARVEDILOL IMMEDIATE RELEASE 25 MG TABLET: 25 mg | ORAL | 30 days supply | Qty: 60 | Fill #9 | Status: CP

## 2023-08-16 ENCOUNTER — Encounter
Admit: 2023-08-16 | Payer: PRIVATE HEALTH INSURANCE | Attending: Student in an Organized Health Care Education/Training Program | Primary: Internal Medicine

## 2023-08-22 ENCOUNTER — Ambulatory Visit
Admit: 2023-08-22 | Payer: PRIVATE HEALTH INSURANCE | Attending: Student in an Organized Health Care Education/Training Program | Primary: Internal Medicine

## 2023-08-22 ENCOUNTER — Inpatient Hospital Stay: Admit: 2023-08-22 | Discharge: 2023-08-22 | Payer: Medicare (Managed Care) | Primary: Internal Medicine

## 2023-08-22 VITALS — BP 133/73 | HR 72 | Temp 97.20000°F | Ht 67.992 in | Wt 276.0 lb

## 2023-08-22 DIAGNOSIS — Z940 Kidney transplant status: Secondary | ICD-10-CM

## 2023-08-22 DIAGNOSIS — Z7960 Long term (current) use of unspecified immunomodulators and immunosuppressants: Secondary | ICD-10-CM

## 2023-08-22 DIAGNOSIS — E1165 Type 2 diabetes mellitus with hyperglycemia: Secondary | ICD-10-CM

## 2023-08-22 DIAGNOSIS — E119 Type 2 diabetes mellitus without complications: Secondary | ICD-10-CM

## 2023-08-22 DIAGNOSIS — R809 Proteinuria, unspecified: Secondary | ICD-10-CM

## 2023-08-22 LAB — PHOSPHORUS     (BH GH L LMW YH): BKR PHOSPHORUS: 3.5 mg/dL (ref 2.2–4.5)

## 2023-08-22 LAB — BASIC METABOLIC PANEL
BKR ANION GAP: 12 (ref 7–17)
BKR BLOOD UREA NITROGEN: 15 mg/dL (ref 8–23)
BKR BUN / CREAT RATIO: 8.4 (ref 8.0–23.0)
BKR CALCIUM: 8.9 mg/dL (ref 8.8–10.2)
BKR CHLORIDE: 102 mmol/L (ref 98–107)
BKR CO2: 27 mmol/L (ref 20–30)
BKR CREATININE DELTA: 0.04
BKR CREATININE: 1.78 mg/dL — ABNORMAL HIGH (ref 0.40–1.30)
BKR EGFR, CREATININE (CKD-EPI 2021): 43 mL/min/{1.73_m2} — ABNORMAL LOW (ref >=60)
BKR GLUCOSE: 97 mg/dL (ref 70–100)
BKR POTASSIUM: 4.3 mmol/L (ref 3.3–5.3)
BKR SODIUM: 141 mmol/L (ref 136–144)

## 2023-08-22 LAB — CBC WITH AUTO DIFFERENTIAL
BKR WAM ABSOLUTE IMMATURE GRANULOCYTES.: 0.03 x 1000/ÂµL (ref 0.00–0.30)
BKR WAM ABSOLUTE LYMPHOCYTE COUNT.: 0.76 x 1000/ÂµL (ref 0.60–3.70)
BKR WAM ABSOLUTE NRBC: 0 x 1000/ÂµL (ref 0.00–1.00)
BKR WAM ANC (ABSOLUTE NEUTROPHIL COUNT): 5.75 x 1000/ÂµL (ref 2.00–7.60)
BKR WAM BASOPHIL ABSOLUTE COUNT.: 0.02 x 1000/ÂµL (ref 0.00–1.00)
BKR WAM BASOPHILS: 0.3 % (ref 0.0–1.4)
BKR WAM EOSINOPHIL ABSOLUTE COUNT.: 0.09 x 1000/ÂµL (ref 0.00–1.00)
BKR WAM EOSINOPHILS: 1.2 % (ref 0.0–5.0)
BKR WAM HEMATOCRIT: 43.3 % (ref 38.50–50.00)
BKR WAM HEMOGLOBIN: 14.3 g/dL (ref 13.2–17.1)
BKR WAM IMMATURE GRANULOCYTES: 0.4 % (ref 0.0–1.0)
BKR WAM LYMPHOCYTES: 10.4 % — ABNORMAL LOW (ref 17.0–50.0)
BKR WAM MCH: 31.6 pg (ref 27.0–33.0)
BKR WAM MCHC: 33 g/dL (ref 31.0–36.0)
BKR WAM MCV: 95.6 fL (ref 80.0–100.0)
BKR WAM MONOCYTE ABSOLUTE COUNT.: 0.67 x 1000/ÂµL (ref 0.00–1.00)
BKR WAM MONOCYTES: 9.2 % (ref 4.0–12.0)
BKR WAM MPV: 10 fL (ref 8.0–12.0)
BKR WAM NEUTROPHILS: 78.5 % — ABNORMAL HIGH (ref 39.0–72.0)
BKR WAM NUCLEATED RED BLOOD CELLS: 0 % (ref 0.0–1.0)
BKR WAM PLATELETS: 217 x1000/ÂµL (ref 150–420)
BKR WAM RDW-CV: 13.2 % (ref 11.0–15.0)
BKR WAM RED BLOOD CELL COUNT.: 4.53 M/ÂµL (ref 4.00–6.00)
BKR WAM WHITE BLOOD CELL COUNT: 7.3 x1000/ÂµL (ref 4.0–11.0)

## 2023-08-22 LAB — MAGNESIUM: BKR MAGNESIUM: 1.7 mg/dL (ref 1.7–2.4)

## 2023-08-22 LAB — TACROLIMUS LEVEL     (BH GH L LMW YH): BKR TACROLIMUS BLOOD: 12.2 ng/mL

## 2023-08-22 MED ORDER — TRULICITY 3 MG/0.5 ML SUBCUTANEOUS PEN INJECTOR
3 | SUBCUTANEOUS | 4 refills | 84.00000 days | Status: AC
Start: 2023-08-22 — End: ?
  Filled 2023-08-30: qty 6, 84d supply, fill #0

## 2023-08-22 MED ORDER — EMPAGLIFLOZIN 10 MG TABLET
10 | ORAL_TABLET | Freq: Every day | ORAL | 2 refills | 90.00000 days | Status: AC
Start: 2023-08-22 — End: ?
  Filled 2023-08-30: qty 90, 90d supply, fill #0

## 2023-08-22 NOTE — Progress Notes
 Transplant-Endocrinology Clinic HPI: Adam Harper is a 63 y.o. 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  He has no complications from diabetes. Interim JW:JXBJY our last appt he has been doing well. No major concerns todayDid blood work todayHypoglycemia: No severe hypoglycemia requiring assistanceCurrent Regimen:Trulicity  3mg  weeklyPrevious Regimen: noneBlood sugar review:BG review: Glucometer80-170 (170 post meal)Meds:Current Medications Medication Sig  amLODIPine  (NORVASC ) 5 mg tablet Take 1 tablet (5 mg total) by mouth daily.  atorvastatin  (LIPITOR) 40 mg tablet Take 1 tablet (40 mg total) by mouth daily.  carvediloL  (COREG ) 25 mg Immediate Release tablet Take 1 tablet (25 mg total) by mouth 2 (two) times daily with breakfast and dinner.  dulaglutide  (TRULICITY ) 3 mg/0.5 mL Pen Injector Inject 0.5 mLs (3 mg total) under the skin every 7 days.  fluticasone  propionate (FLONASE ) 50 mcg/actuation nasal spray Use 2 sprays in each nostril daily. Use for at least 4-6 weeks to see any improvement  losartan  (COZAAR ) 100 mg tablet Take 1 tablet (100 mg total) by mouth daily.  magnesium  oxide (MAG-OX) 400 mg (241.3 mg magnesium ) tablet Take 2 tablets (800 mg total) by mouth 2 (two) times daily.  mycophenolate  mofetil (CELLCEPT ) 250 mg capsule Take 3 capsules (750 mg total) by mouth 2 (two) times daily.  omeprazole  (PRILOSEC) 20 mg capsule Take 1 capsule (20 mg total) by mouth daily.  predniSONE  (DELTASONE ) 5 mg tablet Take 1 tablet (5 mg total) by mouth daily.  sodium chloride  (OCEAN) 0.65 % nasal spray Use 2 sprays in each nostril nightly.  tacrolimus  XR (ENVARSUS  XR) 1 mg 24 hr tablet Take 1 tablet (1 mg total) by mouth daily. Take with 4 mg tabs for total dose 9 mg daily  tacrolimus  XR (ENVARSUS  XR) 4 mg 24 hr tablet Take 2 tablets (8 mg total) by mouth daily. Take with 1 mg tabs for total dose 9 mg daily  tadalafiL  (CIALIS ) 20 mg tablet Take 1 tablet (20 mg total) by mouth every other day as needed for erectile dysfunction. Allergies: Patient has no known allergies.ROS: The ROS has been reviewed and, except for where noted above, the remaining 12-point systems review was otherwise negative. Last 6 weights   Weight (lb) Weight (kg) 05/24/2023 281 lb 14.4 oz  127.869 kg  06/16/2023 279 lb  126.554 kg  08/22/2023 276 lb 0.3 oz  125.2 kg  PE: BP 133/73 (Site: l a, Position: Sitting, Cuff Size: Large)  - Pulse 72  - Temp 97.2 ?F (36.2 ?C) (Temporal)  - Ht 5' 7.99 (1.727 m)  - Wt 125.2 kg  - SpO2 97%  - BMI 41.98 kg/m? Gen: No acute distressLungs: Clear to auscultation bilaterallyNeurologic: CN 2-12 grossly intact. Skin: No darkening of skin, no acanthosis, no striaeExt: 	LE: no bruising, proximal muscle weakness, or edema Labs/Imaging:Lab Results Component Value Date  HGBA1C 6.1 (H) 09/03/2022  HGBA1C 6.2 (H) 11/13/2021  HGBA1C 6.4 (H) 01/19/2021  CHOL 113 11/18/2022  LDL 44 11/18/2022  HDL 33 (L) 11/18/2022  TRIG 230 (H) 11/18/2022  TSH 1.650 09/03/2022  HGB 14.8 05/17/2023  CREATININE 1.74 (H) 05/17/2023  EGFRNONAFRAM 36 09/25/2020  EGFRAFRAMER 44 09/25/2020  ALBURR 685.1 05/17/2023  ALBCREATR 985.8 (H) 05/17/2023  AST 15 11/18/2019  ALT 16 11/18/2019 No results found for: GAD-65 AB, IA-2 ANTIBODY, GAD65,  IA-2, C PEPTIDE, ISLET CELL ANTIBODY SCREEN, GAD65, IA-2, AND INSULIN  AUTOANTIBODY Impression: [name] is presenting today as a follow up. No new A1c but data from his glucometer revealed well control DM. He has significant proteinuria and discussed today the possibility of adding an SGLT2i to his regimen. After share decision making, we decided to start the medication. Recommendations:#Diabetes- A1c level, repeat micro-albumin levels in 6 months- Trulicity  3mg  weekly- Start Jardiance 10mg - Target Fasting BG 80-140- Check BS 1X/day. Check BS before driving and eat a snack if BS <100. Keep organized log and bring it next time. Carry glucose tablets or snacks in case of hypoglycemia.- Diabetic diet, moderate intensity exercise 150 minutes/week, weight loss 5-10% of body weight.- F/u with ophthalmology for complete eye exam periodically.RTC 6 months Mychart message okayElectronically Signed by Leonel Ram, MD

## 2023-08-23 ENCOUNTER — Ambulatory Visit
Admit: 2023-08-23 | Payer: PRIVATE HEALTH INSURANCE | Attending: Student in an Organized Health Care Education/Training Program | Primary: Internal Medicine

## 2023-08-23 ENCOUNTER — Encounter: Admit: 2023-08-23 | Payer: PRIVATE HEALTH INSURANCE | Attending: Nursing | Primary: Internal Medicine

## 2023-08-23 VITALS — BP 121/60 | HR 73 | Temp 97.40000°F | Resp 18 | Ht 67.815 in | Wt 276.9 lb

## 2023-08-23 DIAGNOSIS — Z940 Kidney transplant status: Secondary | ICD-10-CM

## 2023-08-23 DIAGNOSIS — Z796 Long-term use of immunosuppressant medication: Secondary | ICD-10-CM

## 2023-08-23 DIAGNOSIS — R809 Proteinuria, unspecified: Secondary | ICD-10-CM

## 2023-08-23 NOTE — Progress Notes
 Labs reviewed with transplant nephrologist/surgeon Dr. Ival Marines, no changes were made. F/u labs in 2-3 weeks.Electronically Signed by Cathalene Clipper, RN, August 23, 2023

## 2023-08-30 MED FILL — MYCOPHENOLATE MOFETIL 250 MG CAPSULE: 250 mg | ORAL | 40 days supply | Qty: 240 | Fill #6 | Status: CP

## 2023-09-07 ENCOUNTER — Encounter: Admit: 2023-09-07 | Payer: PRIVATE HEALTH INSURANCE | Primary: Internal Medicine

## 2023-09-07 DIAGNOSIS — Z940 Kidney transplant status: Principal | ICD-10-CM

## 2023-09-07 DIAGNOSIS — Z796 Long-term use of immunosuppressant medication: Secondary | ICD-10-CM

## 2023-09-07 NOTE — Progress Notes
 Called Keaghan to remind him to do labs. Unable to speak with pt. Left a VM. MD unavailable, verbal obtained from Dr. Melanee orders placed.

## 2023-09-09 ENCOUNTER — Ambulatory Visit
Admit: 2023-09-09 | Payer: PRIVATE HEALTH INSURANCE | Attending: Student in an Organized Health Care Education/Training Program | Primary: Internal Medicine

## 2023-09-13 ENCOUNTER — Encounter: Admit: 2023-09-13 | Payer: PRIVATE HEALTH INSURANCE | Primary: Internal Medicine

## 2023-09-13 MED FILL — MAGNESIUM OXIDE 400 MG (241.3 MG MAGNESIUM) TABLET: 400 mg (241.3 mg magnesium) | ORAL | 90 days supply | Qty: 360 | Fill #0 | Status: CP

## 2023-09-13 MED FILL — LOSARTAN 100 MG TABLET: 100 mg | ORAL | 90 days supply | Qty: 90 | Fill #3 | Status: CP

## 2023-09-13 MED FILL — ATORVASTATIN 40 MG TABLET: 40 mg | ORAL | 90 days supply | Qty: 90 | Fill #2 | Status: CP

## 2023-09-13 MED FILL — OMEPRAZOLE 20 MG CAPSULE,DELAYED RELEASE: 20 mg | ORAL | 90 days supply | Qty: 90 | Fill #1 | Status: CP

## 2023-09-13 MED FILL — AMLODIPINE 5 MG TABLET: 5 mg | ORAL | 30 days supply | Qty: 30 | Fill #9 | Status: CP

## 2023-09-13 MED FILL — PREDNISONE 5 MG TABLET: 5 mg | ORAL | 30 days supply | Qty: 30 | Fill #3 | Status: CP

## 2023-09-13 MED FILL — CARVEDILOL IMMEDIATE RELEASE 25 MG TABLET: 25 mg | ORAL | 30 days supply | Qty: 60 | Fill #10 | Status: CP

## 2023-09-13 MED FILL — TACROLIMUS XR 4 MG TABLET,EXTENDED RELEASE 24 HR: 4 mg | ORAL | 30 days supply | Qty: 60 | Fill #2 | Status: CP

## 2023-09-13 MED FILL — TACROLIMUS XR 1 MG TABLET,EXTENDED RELEASE 24 HR: 1 mg | ORAL | 30 days supply | Qty: 30 | Fill #3 | Status: CP

## 2023-09-15 ENCOUNTER — Encounter: Admit: 2023-09-15 | Payer: PRIVATE HEALTH INSURANCE | Primary: Internal Medicine

## 2023-09-15 NOTE — Progress Notes
 SOCIAL WORK NOTEPatient Name: Adam Harper Record Number: [id number] Date of Birth: Feb 18, 1962Medical Social Work Follow Up  [employer] Most Recent Value Admission Information  Document Type Progress Note Reason for Current Social Work Involvement Support/Coping Mandated Report Required no Intervention Psycho-social Intervention Source of Information Patient Record Reviewed Yes Level of Care Ambulatory What medium(s) of communication were used with patient/family/caregiver? Face-to-Face / In-Person Psychosocial issues requiring intervention Adam Harper, a kidney transplant recipient, called requesting the completion of a utility shut off prevention form. Psychosocial interventions 15 minutes were spent over the telephone with Adam Harper. Collaborated with interpreter for this encounter (ID# [id number]).  I returned the above call to Adam Harper who requests a shut off prevention form be completed for him. I reviewed this need with Adam Harper who explains he needs constant access to electricity to keep certain medications refrigerated. Additionally, he is working on paying the bill, however, is concerned due to the transplant, to go without. With the assistance of Dr. Isaac, the form was completed on-line. Collaborations None Specific referrals to enhance community supports (include existing and new resources) Utility Shutt Off Prevention Handoff Required? No Social Work will take lead to arrange post-acute care services and continue work with the care team as patient progresses towards discharge No Next Steps/Plan (including hand-off): No further social work intervention indicated.  Adam Harper and/or the transplant team may re-consult social work for future psychosocial needs. Signature: Adam Harper, LMSW Contact Information: [phone]

## 2023-09-15 NOTE — Progress Notes
 Called Adam Harper to remind him to do labs. Left a VM

## 2023-09-21 ENCOUNTER — Telehealth: Admit: 2023-09-21 | Payer: PRIVATE HEALTH INSURANCE | Primary: Internal Medicine

## 2023-09-21 NOTE — Telephone Encounter
 Spanish Interpreter ID: [id number] Called pt regarding labs. He reports he will go tomorrow. Will follow up with results.

## 2023-09-22 ENCOUNTER — Inpatient Hospital Stay: Admit: 2023-09-22 | Discharge: 2023-09-22 | Payer: Medicare (Managed Care) | Primary: Internal Medicine

## 2023-09-22 DIAGNOSIS — Z940 Kidney transplant status: Principal | ICD-10-CM

## 2023-09-22 DIAGNOSIS — Z796 Long-term use of immunosuppressant medication: Secondary | ICD-10-CM

## 2023-09-22 LAB — BASIC METABOLIC PANEL
BKR ANION GAP: 15 (ref 7–17)
BKR BLOOD UREA NITROGEN: 14 mg/dL (ref 8–23)
BKR BUN / CREAT RATIO: 7.8 — ABNORMAL LOW (ref 8.0–23.0)
BKR CALCIUM: 8.4 mg/dL — ABNORMAL LOW (ref 8.8–10.2)
BKR CHLORIDE: 104 mmol/L (ref 98–107)
BKR CO2: 24 mmol/L (ref 20–30)
BKR CREATININE DELTA: 0.02
BKR CREATININE: 1.8 mg/dL — ABNORMAL HIGH (ref 0.40–1.30)
BKR EGFR, CREATININE (CKD-EPI 2021): 42 mL/min/1.73m2 — ABNORMAL LOW (ref >=60)
BKR GLUCOSE: 92 mg/dL (ref 70–100)
BKR POTASSIUM: 3.8 mmol/L (ref 3.3–5.3)
BKR SODIUM: 143 mmol/L (ref 136–144)

## 2023-09-22 LAB — CBC WITH AUTO DIFFERENTIAL
BKR WAM ABSOLUTE IMMATURE GRANULOCYTES.: 0.02 x 1000/ÂµL (ref 0.00–0.30)
BKR WAM ABSOLUTE LYMPHOCYTE COUNT.: 0.93 x 1000/ÂµL (ref 0.60–3.70)
BKR WAM ABSOLUTE NRBC: 0 x 1000/ÂµL (ref 0.00–1.00)
BKR WAM ANC (ABSOLUTE NEUTROPHIL COUNT): 4.04 x 1000/ÂµL (ref 2.00–7.60)
BKR WAM BASOPHIL ABSOLUTE COUNT.: 0.03 x 1000/ÂµL (ref 0.00–1.00)
BKR WAM BASOPHILS: 0.5 % (ref 0.0–1.4)
BKR WAM EOSINOPHIL ABSOLUTE COUNT.: 0.07 x 1000/ÂµL (ref 0.00–1.00)
BKR WAM EOSINOPHILS: 1.2 % (ref 0.0–5.0)
BKR WAM HEMATOCRIT: 42.1 % (ref 38.50–50.00)
BKR WAM HEMOGLOBIN: 13.4 g/dL (ref 13.2–17.1)
BKR WAM IMMATURE GRANULOCYTES: 0.4 % (ref 0.0–1.0)
BKR WAM LYMPHOCYTES: 16.5 % — ABNORMAL LOW (ref 17.0–50.0)
BKR WAM MCH: 30.8 pg (ref 27.0–33.0)
BKR WAM MCHC: 31.8 g/dL (ref 31.0–36.0)
BKR WAM MCV: 96.8 fL (ref 80.0–100.0)
BKR WAM MONOCYTE ABSOLUTE COUNT.: 0.54 x 1000/ÂµL (ref 0.00–1.00)
BKR WAM MONOCYTES: 9.6 % (ref 4.0–12.0)
BKR WAM MPV: 10.1 fL (ref 8.0–12.0)
BKR WAM NEUTROPHILS: 71.8 % (ref 39.0–72.0)
BKR WAM NUCLEATED RED BLOOD CELLS: 0 % (ref 0.0–1.0)
BKR WAM PLATELETS: 188 x1000/ÂµL (ref 150–420)
BKR WAM RDW-CV: 13.2 % (ref 11.0–15.0)
BKR WAM RED BLOOD CELL COUNT.: 4.35 M/ÂµL (ref 4.00–6.00)
BKR WAM WHITE BLOOD CELL COUNT: 5.6 x1000/ÂµL (ref 4.0–11.0)

## 2023-09-22 LAB — PHOSPHORUS     (BH GH L LMW YH): BKR PHOSPHORUS: 3.7 mg/dL (ref 2.2–4.5)

## 2023-09-22 LAB — MAGNESIUM: BKR MAGNESIUM: 1.7 mg/dL (ref 1.7–2.4)

## 2023-09-22 LAB — ALBUMIN/CREATININE PANEL, URINE, RANDOM
BKR ALBUMIN, URINE, RANDOM: 446.5 mg/L
BKR ALBUMIN/CREATININE RATIO, URINE, RANDOM: 288.8 mg/g{creat} — ABNORMAL HIGH (ref <30.0)
BKR CREATININE, URINE, RANDOM: 155 mg/dL

## 2023-09-22 LAB — TACROLIMUS LEVEL     (BH GH L LMW YH): BKR TACROLIMUS BLOOD: 8.4 ng/mL

## 2023-09-23 ENCOUNTER — Encounter: Admit: 2023-09-23 | Payer: PRIVATE HEALTH INSURANCE | Primary: Internal Medicine

## 2023-09-23 NOTE — Other
 Labs reviewed, no changes were made.Electronically Signed by Johana JONELLE Smiling, APRN, September 23, 2023

## 2023-09-26 MED FILL — FLUTICASONE PROPIONATE 50 MCG/ACTUATION NASAL SPRAY,SUSPENSION: 50 | NASAL | 30 days supply | Qty: 16 | Fill #4 | Status: CP

## 2023-10-12 ENCOUNTER — Ambulatory Visit: Admit: 2023-10-12 | Payer: PRIVATE HEALTH INSURANCE | Attending: Cardiovascular Disease | Primary: Internal Medicine

## 2023-10-12 VITALS — BP 116/68 | HR 70 | Ht 67.82 in | Wt 273.4 lb

## 2023-10-12 DIAGNOSIS — E119 Type 2 diabetes mellitus without complications: Secondary | ICD-10-CM

## 2023-10-12 DIAGNOSIS — G4733 Obstructive sleep apnea (adult) (pediatric): Secondary | ICD-10-CM

## 2023-10-12 DIAGNOSIS — Z940 Kidney transplant status: Secondary | ICD-10-CM

## 2023-10-12 DIAGNOSIS — E781 Pure hyperglyceridemia: Secondary | ICD-10-CM

## 2023-10-12 DIAGNOSIS — I10 Essential (primary) hypertension: Principal | ICD-10-CM

## 2023-10-12 NOTE — Patient Instructions
 Blood work for cholesterol with next blood draw

## 2023-10-12 NOTE — Progress Notes
 Patient ID: [id number] Patient Name: Adam Harper of Birth: Apr 04, 1962Date of Service: 7/30/2025HISTORY OF PRESENT ILLNESS:  Patient is a 63 y.o. 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 A perfusion imaging stress test was obtained on January 02, 2015. This revealed a small mild basal to mid inferolateral fixed defect without evidence of ischemia. There was moderate LAD, circumflex and right coronary artery calcification. Aortic dimensions measured 4.1 cm at the proximal aortic root. Echocardiogram obtained on August 24, 2016 revealed an ejection fraction greater than 70% without evidence of significant LVOT obstruction, mild mitral and tricuspid insufficiency, aortic sclerosis, maximal aortic dimensions of 4.1 centimeters,, mild left atrial enlargement, estimated RV systolic pressure of 25 millimeters of mercury.  Moderate concentric LVH. [place] Chest completed on 05/25/2018 showed unchanged dilated ascending aorta to 4.1 cm. Severe three-vessel coronary arterial calcification. Indeterminant left renal nodular densities may represent hemorrhagic/proteinaceous cyst given. Recommend correlation with today's ultrasound. Further characterization can be performed with [place] or MRI renal mass protocol if warranted. 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 Dedicated thymic MRI can be performed for further characterization as clinically indicated. US  Abdominal Aorta completed on 05/25/2018 showed the proximal abdominal aorta measures 2.6 x 2.4 cm in size. The mid abdominal aorta measures 2.2 x 1.9 cm in size. No evidence for abdominal aortic aneurysm. Echocardiogram obtained November 10, 2018. Mildly increased left ventricular cavity size. Normal left ventricular systolic function. Severe concentric left ventricular hypertrophy. LVEF calculated by 3DE was 68%. Normal right ventricular cavity size and systolic function. Right ventricular systolic pressure is unable to be estimated due to insufficient Doppler signal. Left atrium is moderately dilated. Mild aortic valve thickening. No aortic stenosis. Trace aortic regurgitation. Dilated sinuses of Valsalva with a diameter of 4.4 cm and dilated ascending aorta with a diameter of 4.2 cm. No evidence of pericardial effusion. Compared to the study from 08/24/16, the left ventricular hypertrophy is now severe.An Echo 2D Complete w Doppler and CFI if Ind Image Enhancement 3D and or bubbles was obtained on October 30, 2019. Normal left ventricular size, systolic function and wall motion. Moderate concentric left ventricular hypertrophy.  LVEF calculated by biplane Simpson's was 57%.  Abnormal tissue Doppler suggestive of abnormal diastolic function. Normal right ventricular cavity size and systolic function.  Right ventricular systolic pressure is unable to be estimated due to insufficient Doppler signal.Atria are normal in size. No significant valvular abnormalities. All visible segments of the aorta are normal in size. IVC diameter < 2.1 cm that collapses > 50% with a sniff suggests normal RAP (0-5 mmHg, mean 3 mmHg). No evidence of pericardial effusion. Compared with the prior study, dated 11/10/2018, the measurements of the left ventricular wall thickness, the left atrium, and the ascending aorta were previously abnormal. Technical differences in image acquisition may account for at least some of this apparent change.24 hour Holter monitor from December 11, 2019 revealed sinus rhythm with an average heart rate of 71, 501 PVCs, 100 APCs, brief SVT with no nonsustained ventricular tachycardia.An EKG was obtained on August 27, 2020. This revealed Sinus rhythm rate of 73, intraventricular conduction delay/incomplete left bundle branch block, LVH with strain pattern, T-wave inversions in 1, aVL as well as leads V4 through V6. Similar to previous EKGs.An Echo 2D Complete w Doppler and CFI if Ind Image Enhancement 3D and or bubbles was obtained on September 19, 2020.  Normal left ventricular cavity size.  Moderate concentric left ventricular hypertrophy.  Hyperdynamic left ventricular systolic function.  LVEF calculated by biplane Simpson's was 75%.  Moderate diastolic dysfunction consistent with increase in filling pressure (grade 2). Normal right ventricular cavity size and systolic function.  Estimated right ventricular systolic pressure is 34 mmHg. Visually the left atrium appears dilated.  No interatrial shunt by color Doppler.  Right atrium is normal in size.Aortic valve is trileaflet.  Mild aortic valve thickening.  No aortic stenosis.  Trace aortic regurgitation. Thickened mitral valve.  Mild mitral regurgitation.  No mitral stenosis.Dilated sinuses of Valsalva with a diameter of 4.2 cm and dilated ascending aorta with a diameter of 4.1 cm. IVC diameter > 2.1 cm that collapses > 50% with a sniff or RAP (5-10 mmHg, mean 8 mmHg). Trivial pericardial effusion. Compared to the images August 2021, left ventricular systolic function is stable.  Differences in aortic dimensions may be technical, and are similar to the prior years studies.  The IVC is dilated on the current study. Adam Harper is presenting on 10/12/2023 for a follow-up cardiac followup visit. He has been doing well from a cardiac perspective since the last visit. A Spanish translator was used during this visit.  He has felt well since our last visit.  He has been started on Jardiance .  He notes increased urine output on some days on Jardiance .  He denies increasing edema.  He denies shortness or breath at rest, orthopnea, PND.  He is now walking 2 miles daily with improved stamina.  He was recently seen by vascular surgery.  His fistula is being followed closely.  His renal function has been stable.  No lightheadedness, dizziness, near-syncope, syncope or palpitations.  No chest pain, arm pain, neck pain, jaw pain at rest or on exertion.  No melena or bleeding.PAST MEDICAL HISTORY:Past Medical History: Diagnosis Date  A-V fistula (HC Code)   right arm  Anemia   Anemia in ESRD (end-stage renal disease) (HC Code) 10/24/2014  Added automatically from request for surgery [id number]  Added automatically from request for surgery [id number] Overview:  Added automatically from request for surgery [id number]  Anemia in ESRD (end-stage renal disease) (HC Code)  10/24/2014  Formatting of this note might be different from the original. Overview:  Added automatically from request for surgery [id number] Overview:  Added automatically from request for surgery [id number]  AV fistula infection (HC Code)   Cirrhosis (HC Code)    ESRD on hemodialysis (HC Code)    Mon-Wed-Fri  GERD (gastroesophageal reflux disease)   Hypercholesteremia   Hyperparathyroidism (HC Code) (HC CODE)   Hypertension   Infected prosthetic vascular graft, initial encounter (HC Code) 10/24/2014  Influenza A   Morbid obesity (HC Code) (HC CODE) 02/03/2018  Obstructive sleep apnea   pt uses CPAP  Secondary hyperparathyroidism of renal origin (HC Code) (HC CODE)   Thoracic aortic aneurysm (HC Code)  Past Surgical History: Procedure Laterality Date  AV FISTULA PLACEMENT Left   left lower arm unsuccessful, then moved to upper arm 2011, then had problems in 10/2013 - infection Social History Tobacco Use Smoking Status Former  Current packs/day: 0.25  Average packs/day: 0.3 packs/day for 25.0 years (6.3 ttl pk-yrs)  Types: Cigarettes Smokeless Tobacco Never CURRENT MEDICATION LIST:Current Outpatient Medications Medication Sig Dispense Refill  amLODIPine  (NORVASC ) 5 mg tablet Take 1 tablet (5 mg total) by mouth daily. 30 tablet 11  atorvastatin  (LIPITOR) 40 mg tablet Take 1 tablet (40 mg total) by mouth daily. 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 Please dispense supplies covered by patient's insurance. 100 each 3  carvediloL  (COREG ) 25 mg Immediate Release tablet Take 1 tablet (25 mg total) by mouth 2 (two) times daily with breakfast and dinner. 60 tablet 11  dulaglutide  (TRULICITY ) 3 mg/0.5 mL Pen Injector Inject 0.5 mLs (3 mg total) under the skin every 7 days. 6 mL 3  empagliflozin  (JARDIANCE ) 10 mg tablet Take 1 tablet (10 mg total) by mouth daily. 90 tablet 1  fluticasone  propionate (FLONASE ) 50 mcg/actuation nasal spray Use 2 sprays in each nostril daily. Use for at least 4-6 weeks to see any improvement 16 g 11  lancets Use to check blood sugar daily [Type 2 Diabetes: E11.9]. Please dispense supplies covered by patient's insurance. 100 each 3  lidocaine  (LIDODERM ) 5 % Place 1 patch onto the skin every 24 hours. Remove & Discard patch within 12 hours or as directed by MD 30 patch 0  losartan  (COZAAR ) 100 mg tablet Take 1 tablet (100 mg total) by mouth daily. 90 tablet 3  magnesium  oxide (MAG-OX) 400 mg (241.3 mg magnesium ) tablet Take 2 tablets (800 mg total) by mouth 2 (two) times daily. 360 tablet 3  mycophenolate  mofetil (CELLCEPT ) 250 mg capsule Take 3 capsules (750 mg total) by mouth 2 (two) times daily. 240 capsule 11  omeprazole  (PRILOSEC) 20 mg capsule Take 1 capsule (20 mg total) by mouth daily. 90 capsule 3  predniSONE  (DELTASONE ) 5 mg tablet Take 1 tablet (5 mg total) by mouth daily. 30 tablet 11  sodium chloride  (OCEAN) 0.65 % nasal spray Use 2 sprays in each nostril nightly. 88 mL 12  tacrolimus  XR (ENVARSUS  XR) 1 mg 24 hr tablet Take 1 tablet (1 mg total) by mouth daily. Take with 4 mg tabs for total dose 9 mg daily 30 tablet 11  tacrolimus  XR (ENVARSUS  XR) 4 mg 24 hr tablet Take 2 tablets (8 mg total) by mouth daily. Take with 1 mg tabs for total dose 9 mg daily 60 tablet 11  tadalafiL  (CIALIS ) 20 mg tablet Take 1 tablet (20 mg total) by mouth every other day as needed for erectile dysfunction. 10 tablet 3 No current facility-administered medications for this visit. CURRENT ALLERGY LIST:No Known AllergiesREVIEW OF SYSTEMS:Remaining 12-point review of systems unremarkable with the exception of history of present illness. This was repeated by me today, October 12, 2023.	VITALS:BP 116/68 (Site: l a, Position: Sitting, Cuff Size: Large)  - Pulse 70  - Ht 5' 7.82 (1.723 m)  - Wt 124 kg  - PF 96 L/min  - BMI 41.79 kg/m?  By M.D. Blood pressure is 118/58 in the left arm while seated.  Aneurysmal right upper extremity AV fistulaPHYSICAL EXAM: There has been no significant change in his physical exam from his prior visit on 10/12/2022.	CONSTITUTIONAL:             GENERAL APPEARANCE:  Patient is male.  Alert, well developed, well nourished.  In no acute distress.     HEAD/FACE: The head is normocephalic.     NECK AND THYROID: No bruits noted in the neck.  Difficult to assess JVD in the context of very thick neck.  No thyroid enlargement.     RESPIRATORY:  No wheezes, rales, or rhonchi. Clear to auscultation.     CARDIOVASCULAR:              PALPATION & AUSCULTATION: S1 S2.  Regular rate with An audible S4 as  well as a holosystolic murmur at the cardiac apex.             ARTERIAL PULSES: Normal carotid pulses with no bruits.  No palpable enlargement or bruit of the abdominal aorta.  No renal bruit.  Normal femoral pulses without bruits or enlargements.  Normal radial pulses.  Normal pedal pulses and capillary refill.             EDEMA/VARISCOSITIES OF EXTREMITIES: trace/mild edema.     GASTROINTESTINAL:              ABDOMEN: Soft, non-tender, non distend. Normal bowel signs. No masses appreciated.             LIVER/KIDNEY/SPLEEN: No hepatosplenomegaly.     PSYCHIATRIC: Awake and alert.  Oriented to person, place, time and general circumstances.  Mood and affect appropriate.TEST RESULTS:  Chemistry Component Value Date/Time  NA 143 09/22/2023 1223  K 3.8 09/22/2023 1223  CL 104 09/22/2023 1223  CO2 24 09/22/2023 1223  BUN 14 09/22/2023 1223  CREATININE 1.80 (H) 09/22/2023 1223  GLU 92 09/22/2023 1223  PROT 6.9 11/18/2019 0741    Component Value Date/Time  CALCIUM  8.4 (L) 09/22/2023 1223  ALKPHOS 65 11/18/2019 0741  AST 15 11/18/2019 0741  AST 15 01/23/2013 1534  ALT 16 11/18/2019 0741  ALT 16 01/23/2013 1534  BILITOT 0.6 11/18/2019 0741  ALBUMIN 3.9 11/18/2019 0741  Lab Results Component Value Date  WBC 5.6 09/22/2023  WBC 7.3 08/22/2023  WBC 5.8 05/17/2023  HGB 13.4 09/22/2023  HGB 14.3 08/22/2023  HGB 14.8 05/17/2023  HCT 42.10 09/22/2023  HCT 43.30 08/22/2023  HCT 46.40 05/17/2023  MCV 96.8 09/22/2023  MCV 95.6 08/22/2023  MCV 95.5 05/17/2023  PLT 188 09/22/2023  PLT 217 08/22/2023  PLT 201 05/17/2023  HGBA1C 6.1 (H) 09/03/2022  HGBA1C 6.2 (H) 11/13/2021  HGBA1C 6.4 (H) 01/19/2021  CHOL 113 11/18/2022  CHOL 110 10/25/2022  CHOL 137 08/05/2020  CHOLHDL 3.4 11/18/2022  CHOLHDL 3.7 10/25/2022  CHOLHDL 3.8 08/05/2020  HDL 33 (L) 11/18/2022  HDL 30 (L) 10/25/2022  HDL 36 (L) 08/05/2020  LDL 44 11/18/2022  LDL 45 91/87/7975  LDL 54 91/87/7975  TRIG 230 (H) 11/18/2022  TRIG 221 (H) 10/25/2022  TRIG 209 (H) 08/05/2020  ALT 16 11/18/2019  ALT 20 11/17/2019  ALT 18 11/05/2019  AST 15 11/18/2019  AST 17 11/17/2019  AST 17 11/05/2019 Lab Results Component Value Date  CREATININE 1.80 (H) 09/22/2023 Lab Results Component Value Date  ALT 16 11/18/2019  AST 15 11/18/2019  ALKPHOS 65 11/18/2019  BILITOT 0.6 11/18/2019 Blood work obtained on August 31, 2021 revealed hematocrit 44.60, hemoglobin 14.4, creatinine 2.20, BUN 19, glucose 107, potassium 3, and sodium 142.  Last LDL cholesterol was 59 in May of 2022.  Previous abdominal ultrasound from March of 2020 with no abdominal aortic aneurysms.  [place] scan from September 09, 2020 with maximal aortic dimensions of 4.1 centimeters.  Stable when compared to echocardiography from July of 2022.An EKG was obtained in the office today.  Independently interpreted by me revealed sinus rhythm at a rate of 70, mild intraventricular conduction delay, shallow T-wave inversions in leads V5 and V6.  Unchanged compared to prior.ASSESSMENT/PLAN:Hypertension: His blood pressure is acceptable today in office.  He has been at goal at his other appointments.  He will continue to monitor BP. He denies any lightheadedness, dizziness, or headaches. His current antihypertensive regimen includes carvedilol  25 mg b.i.d., amlodipine  10 mg daily, and chlorthalidone  50 milligrams daily.  He  is tolerating Jardiance  10 mg daily with a acceptable renal function.  He does not appear grossly volume overloaded on exam today.  He continues to use CPAP for obstructive sleep apnea.  Mildly dilated aortic root: Dilated sinuses of Valsalva with a diameter of 4.1 cm and dilated ascending aorta with a diameter of 4.0 cm on echocardiogram from June of 2018. Most recent [place] Chest completed on 05/25/2018 showed unchanged dilated ascending aorta to 4.1 cm. Severe three-vessel coronary arterial calcification. Indeterminant left renal nodular densities may represent hemorrhagic/proteinaceous cyst given. Recommend correlation with today's ultrasound. Further characterization can be performed with [place] or MRI renal mass protocol if warranted. 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 Dedicated thymic MRI can be performed for further characterization as clinically indicated. US  Abdominal Aorta completed on 05/25/2018 showed the proximal abdominal aorta measures 2.6 x 2.4 cm in size. The mid abdominal aorta measures 2.2 x 1.9 cm in size. No evidence for abdominal aortic aneurysm. I have recommended ongoing blood pressure control.  I will discuss further increases in his antihypertensive regimen with his nephrology team.  I will defer further follow-up of his mediastinal nodule to his primary care team.  Most recent echocardiography from November 10, 2018 revealed maximal aortic dimensions of 4.4 centimeters at the sinuses of Valsalva, 4.2 centimeters at the mid ascending aorta.  This represents an increase from his previous study in 2018. At that time the sinuses of Valsalva measured 4.1 centimeters in the ascending aorta measured 4 centimeters.  Most recent echocardiography in January of 2024 revealed left ventricular hypertrophy with normal LV systolic function, maximal aortic dimensions of 4.2 cm.  Follow-up MRA of the chest from August of 2024 revealed maximal aortic dimensions of 3.9 cm.  We will continue to follow. Dyslipidemia: Most recent lipid profile is at goal. He is tolerating Lipitor 80 mg daily.  I will obtain a follow-up fasting lipid profile. I will plan to see him in follow up in 6 months. He will contact me with any changes or questions. An EKG was ordered during this visit. Scribed for Karol Liendo, Garnette Brightly, MD by Trena Como, medical scribe July 28, 2025The documentation recorded by the scribe accurately reflects the services I personally performed and the decisions made by me. I reviewed and confirmed all material entered and/or pre-charted by the scribe.

## 2023-10-21 MED FILL — CARVEDILOL IMMEDIATE RELEASE 25 MG TABLET: 25 mg | ORAL | 30 days supply | Qty: 60 | Fill #11 | Status: CP

## 2023-10-21 MED FILL — TACROLIMUS XR 1 MG TABLET,EXTENDED RELEASE 24 HR: 1 mg | ORAL | 30 days supply | Qty: 30 | Fill #4 | Status: CP

## 2023-10-21 MED FILL — TACROLIMUS XR 4 MG TABLET,EXTENDED RELEASE 24 HR: 4 mg | ORAL | 30 days supply | Qty: 60 | Fill #3 | Status: CP

## 2023-10-21 MED FILL — PREDNISONE 5 MG TABLET: 5 mg | ORAL | 30 days supply | Qty: 30 | Fill #4 | Status: CP

## 2023-10-21 MED FILL — MYCOPHENOLATE MOFETIL 250 MG CAPSULE: 250 mg | ORAL | 40 days supply | Qty: 240 | Fill #7 | Status: CP

## 2023-10-21 MED FILL — AMLODIPINE 5 MG TABLET: 5 mg | ORAL | 30 days supply | Qty: 30 | Fill #10 | Status: CP

## 2023-10-22 ENCOUNTER — Emergency Department: Admit: 2023-10-22 | Payer: Medicare (Managed Care) | Primary: Internal Medicine

## 2023-10-22 ENCOUNTER — Inpatient Hospital Stay: Admit: 2023-10-22 | Discharge: 2023-10-23 | Payer: Medicare (Managed Care) | Primary: Internal Medicine

## 2023-10-22 DIAGNOSIS — M545 Low back pain, unspecified: Secondary | ICD-10-CM

## 2023-10-22 DIAGNOSIS — M7918 Myalgia, other site: Secondary | ICD-10-CM

## 2023-10-22 LAB — URINALYSIS WITH CULTURE REFLEX      (BH LMW YH)
BKR BILIRUBIN, UA: NEGATIVE
BKR BLOOD, UA: NEGATIVE
BKR KETONES, UA: NEGATIVE
BKR LEUKOCYTE ESTERASE, UA: NEGATIVE
BKR NITRITE, UA: NEGATIVE
BKR PH, UA: 6 (ref 5.5–7.5)
BKR SPECIFIC GRAVITY, UA: 1.024 (ref 1.005–1.030)
BKR UROBILINOGEN, UA: <2 mg/dL (ref <=2.0)

## 2023-10-22 LAB — URINE MICROSCOPIC     (BH GH LMW YH)
BKR RBC/HPF, UA (INSTRUMENT): 5 /HPF — ABNORMAL HIGH (ref 0–2)
BKR WBC/HPF, UA (INSTRUMENT): 1 /HPF (ref 0–5)

## 2023-10-22 MED ORDER — ACETAMINOPHEN 325 MG TABLET
325 | Freq: Once | ORAL | Status: CP
Start: 2023-10-22 — End: ?
  Administered 2023-10-22: 23:00:00 325 mg via ORAL

## 2023-10-22 MED ORDER — LIDOCAINE 4 % TOPICAL PATCH
4 | Freq: Once | TRANSDERMAL | Status: DC
Start: 2023-10-22 — End: 2023-10-23

## 2023-10-22 MED ORDER — CYCLOBENZAPRINE 10 MG TABLET
10 | Freq: Once | ORAL | Status: CP
Start: 2023-10-22 — End: ?
  Administered 2023-10-22: 23:00:00 10 mg via ORAL

## 2023-10-22 NOTE — ED Notes
 11:04 PM 63y/o A/Ox4 with complaints of right leg pain. Pt reports for the past couple of days he has been experiencing lower back pain that radiates into his right buttocks and down his right leg all the way to his feet that has gotten progressively worse.. Denies loss of bowel/bladder and numbness/tingling at this time. Denies headache/dizziness, fever/chills, CP/SOB, abdominal pain, N/V/D, or any other medical complaints at this time. Provider at bedside. Pt medicated as per [facility], informed of the need of urine specimen, verbalized understanding, collection cup provided at bedside.

## 2023-10-23 ENCOUNTER — Emergency Department: Admit: 2023-10-23 | Payer: Medicare (Managed Care) | Primary: Internal Medicine

## 2023-10-23 DIAGNOSIS — M5416 Radiculopathy, lumbar region: Principal | ICD-10-CM

## 2023-10-23 DIAGNOSIS — M7918 Myalgia, other site: Secondary | ICD-10-CM

## 2023-10-23 DIAGNOSIS — Z940 Kidney transplant status: Secondary | ICD-10-CM

## 2023-10-23 DIAGNOSIS — M545 Low back pain, unspecified: Secondary | ICD-10-CM

## 2023-10-23 DIAGNOSIS — I10 Essential (primary) hypertension: Secondary | ICD-10-CM

## 2023-10-23 LAB — BASIC METABOLIC PANEL
BKR ANION GAP: 13 (ref 7–17)
BKR BLOOD UREA NITROGEN: 18 mg/dL (ref 8–23)
BKR BUN / CREAT RATIO: 11.3 (ref 8.0–23.0)
BKR CALCIUM: 9.1 mg/dL (ref 8.8–10.2)
BKR CHLORIDE: 102 mmol/L (ref 98–107)
BKR CO2: 25 mmol/L (ref 20–30)
BKR CREATININE DELTA: -0.2
BKR CREATININE: 1.6 mg/dL — ABNORMAL HIGH (ref 0.40–1.30)
BKR EGFR, CREATININE (CKD-EPI 2021): 49 mL/min/1.73m2 — ABNORMAL LOW (ref >=60)
BKR GLUCOSE: 121 mg/dL — ABNORMAL HIGH (ref 70–100)
BKR POTASSIUM: 4.5 mmol/L (ref 3.3–5.3)
BKR SODIUM: 140 mmol/L (ref 136–144)

## 2023-10-23 LAB — CBC WITH AUTO DIFFERENTIAL
BKR WAM ABSOLUTE IMMATURE GRANULOCYTES.: 0.05 x 1000/ÂµL (ref 0.00–0.30)
BKR WAM ABSOLUTE LYMPHOCYTE COUNT.: 0.95 x 1000/ÂµL (ref 0.60–3.70)
BKR WAM ABSOLUTE NRBC: 0 x 1000/ÂµL (ref 0.00–1.00)
BKR WAM ANC (ABSOLUTE NEUTROPHIL COUNT): 5.06 x 1000/ÂµL (ref 2.00–7.60)
BKR WAM BASOPHIL ABSOLUTE COUNT.: 0.03 x 1000/ÂµL (ref 0.00–1.00)
BKR WAM BASOPHILS: 0.4 % (ref 0.0–1.4)
BKR WAM EOSINOPHIL ABSOLUTE COUNT.: 0.09 x 1000/ÂµL (ref 0.00–1.00)
BKR WAM EOSINOPHILS: 1.3 % (ref 0.0–5.0)
BKR WAM HEMATOCRIT: 43.9 % (ref 38.50–50.00)
BKR WAM HEMOGLOBIN: 14.4 g/dL (ref 13.2–17.1)
BKR WAM IMMATURE GRANULOCYTES: 0.7 % (ref 0.0–1.0)
BKR WAM LYMPHOCYTES: 13.9 % — ABNORMAL LOW (ref 17.0–50.0)
BKR WAM MCH: 31.2 pg (ref 27.0–33.0)
BKR WAM MCHC: 32.8 g/dL (ref 31.0–36.0)
BKR WAM MCV: 95.2 fL (ref 80.0–100.0)
BKR WAM MONOCYTE ABSOLUTE COUNT.: 0.64 x 1000/ÂµL (ref 0.00–1.00)
BKR WAM MONOCYTES: 9.4 % (ref 4.0–12.0)
BKR WAM MPV: 10.1 fL (ref 8.0–12.0)
BKR WAM NEUTROPHILS: 74.3 % — ABNORMAL HIGH (ref 39.0–72.0)
BKR WAM NUCLEATED RED BLOOD CELLS: 0 % (ref 0.0–1.0)
BKR WAM PLATELETS: 134 x1000/ÂµL — ABNORMAL LOW (ref 150–420)
BKR WAM RDW-CV: 13.3 % (ref 11.0–15.0)
BKR WAM RED BLOOD CELL COUNT.: 4.61 M/ÂµL (ref 4.00–6.00)
BKR WAM WHITE BLOOD CELL COUNT: 6.8 x1000/ÂµL (ref 4.0–11.0)

## 2023-10-23 LAB — SEDIMENTATION RATE (ESR): BKR SEDIMENTATION RATE, ERYTHROCYTE: 7 mm/h (ref 0–20)

## 2023-10-23 LAB — UA REFLEX CULTURE

## 2023-10-23 MED ORDER — CYCLOBENZAPRINE 10 MG TABLET
10 | ORAL_TABLET | Freq: Every evening | ORAL | 1 refills | 5.00000 days | Status: AC | PRN
Start: 2023-10-23 — End: ?
  Filled 2023-10-25: qty 5, 5d supply, fill #0

## 2023-10-23 MED ORDER — OXYCODONE IMMEDIATE RELEASE 5 MG TABLET
5 | Freq: Once | ORAL | 1 refills | Status: CP
Start: 2023-10-23 — End: ?
  Administered 2023-10-23: 02:00:00 5 mg via ORAL

## 2023-10-23 NOTE — ED Notes
 11:35 PMReport received from off-going RN. Introduced self to patient, denies needing anything at this time.12:44 AMPending imaging results. 3:33 AMPt received discharge paperwork, verbalized understanding. Denies any questions or concerns at this time. Pt ambulated out of department with steady gait.

## 2023-10-23 NOTE — ED Provider Notes
 Chief Complaint Patient presents with  Back Pain   To ED for eval  of lower back pain with radiation to right buttocks and into RLE, has had sx for a long time but sx have gotten worse since yesterday. A language interpreter was used during this visit.Translator ID:  #[id number] Language:  Spanish -------------------------------------------------------------------------------------------------Presentation:Patient is a 63 y.o. male with PMHx of ESRD s/p renal transplant (2021), HTN, HLD, OSA, chronic back pain who presents to the emergency department with lower back pain radiating to his right buttock/right lower extremity for ?a long time? that worsened yesterday.  No recent falls or trauma. No history of spinal surgeries or injections. No known history of osteoporosis or malignancy. No history of IV drug use. No thoracic back pain.  Has been able to ambulate.  Patient denies abdominal pain, fevers, lower extremity weakness, lower extremity numbness, saddle numbness, difficulty urinating, urinary or fecal incontinence, flank pain, hematuria, dysuria or rashes. No chest pain, dyspnea or vomiting. No weight loss over the past few months.Relevant Physical Exam: Vitals:  10/23/23 0331 BP: (!) 146/83 Pulse: 67 Resp: 16 Temp: 97.7 ?F (36.5 ?C) General:  Well-appearing. Conversant. No acute distress.Neck: Symmetric and supple. No vertebral tenderness. No step-offs. Patient able to range neck in all directions.Cardiovascular:  Regular rate and rhythm. Well-perfused. Respiratory:  LCTA. No increased work of breathing. Speaking in full sentences.Abdomen:  Soft, non-distended. No abdominal tenderness. No palpable masses. No CVA tenderness.Musculoskeletal: No bony tenderness to extremities, no deformities. No LE edema.Spine: Skin intact, no step-offs or obvious deformities. No discomfort to palpation over the midline spine.  Some discomfort to palpation over right lumbar paraspinal muscles.  Grossly intact strength in all extremities. Sensation is grossly intact to light touch to all four extremities. Distal extremities are warm and well-perfused, capillary refill <2 seconds. Ambulatory.Skin: Warm and dry. Neuro: Alert and oriented to person, place, time and situation. Cranial nerves 2-12 intact. Equal strength and sensation in the bilateral upper and lower extremities.  Differential diagnosis: Includes but not limited to right sided sciatica vs chronic back pain Considered but low concern for spinal fracture. No history of trauma and no vertebral tenderness.  Considered pyelonephritis however unlikely given no urinary symptoms.  No CVA tenderness on exam.Consider nephrolithiasis however less likely given pt is lying still on stretcher, no history of stones, hematuria, flank pain or pain in groin area.Doubt epidural compression syndrome. No lower extremity weakness, numbness in groin area, urinary retention, urinary incontinence or fecal incontinence, does not meet imaging thresholdMDM:   Patient is a 63 y.o. male with PMHx of ESRD s/p renal transplant (2021), HTN, HLD, OSA who presents to the emergency department with lower back pain radiating to his right buttock/right lower extremity for ?a long time? that worsened yesterday.  Patient is well-appearing.  Hypertensive to 150/84, vital signs otherwise stable.  Exam as above.  Plan for multimodal pain control and reassessment. UA to look for signs of infection or blood.  CBC, BMP to look for signs of infection, anemia, kidney function.  ESR to evaluate for inflammation.  Lumbar x-ray to look for compression fracture.ED Course/ Disposition:Lab work reassuring.  No elevation in WBC, stable H&H.  Kidney function at baseline.  UA without signs of infection.  X-ray without obvious fracture.Reassessed patient.  Discussed reassuring blood work and imaging.  Endorses some/minimal improvement in his symptoms. Exam is unchanged, he is ambulating without difficulty.  Offered observation for pain control however patient prefers to be discharged home.  Provided urgent care follow up appointment.  Also sent message to patient's PCP to arrange prompt follow up.  All questions answered.  Strict return precautions provided.Dr. Cy available for consult.Admission or placement in ED observation was considered, however not indicated at this time.   , PA-CAvailable on MHB------------------------------------------------------------------------------------------------- Physical ExamED Triage Vitals [10/22/23 2223]BP: (!) 150/84Pulse: 68Pulse from  O2 sat: n/aResp: 16Temp: 98 ?F (36.7 ?C)Temp src: OralSpO2: 95 % BP (!) 146/83  - Pulse 67  - Temp 97.7 ?F (36.5 ?C) (Oral)  - Resp 16  - Wt 124 kg  - SpO2 97%  - BMI 41.79 kg/m? Physical Exam ProceduresAttestation/Critical CareClinical Impressions as of 10/23/23 0359 Lumbar back pain with radiculopathy affecting right lower extremity  ED DispositionDischarge  Faith Maxwell, PA08/10/25 0350 Faith Maxwell, PA08/10/25 9640

## 2023-10-23 NOTE — Discharge Instructions
 [place] has sido [employer] de Emergencias por dolor de espalda. Tu evaluaci?n no mostr? evidencia de condiciones m?dicas que requieran intervenci?n urgente en este momento. Te hice una cita de seguimiento para que te vean en nuestra atenci?n urgente este mi?rcoles a las 6:00 p.m.- Para el dolor en casa, puedes tomar Tylenol , consulta las instrucciones de dosificaci?n a continuaci?n - Acetaminof?n (Tylenol ) 650 mg (dos pastillas de 325 mg) por boca cada 4-6 horas para el alivio del dolor seg?n sea necesario (t?malo con comida/agua) - No excedas de 3,000 mg de Tylenol  por 24 horas - Tambi?n puedes aplicar un parche de lidoca?na en tu espalda seg?n sea necesario para el dolor. Te recomendamos que lo dejes puesto durante 12 horas y luego lo quites y lo dejes fuera durante 12 horas para darle un descanso a tu piel. - Tambi?n puedes tomar Flexeril  si es necesario para espasmos musculares/dolor (puede causar somnolencia. No tomes si vas a conducir o Arts development officer. No lo tomes con el consumo de alcohol). - Aplica hielo (pon hielo en ignacia harari de pl?stico y luego dentro de una funda de [place]) o calor en el ?rea de dolor y mant?n por unos 10 minutos. No lo apliques directamente sobre la piel.Debe realizar un seguimiento con el m?dico de cabecera para una evaluaci?n adicional de pruebas ambulatorias que confirmen la etiolog?a del dolor de [place], como una posible remisi?n de su m?dico de cabecera a terapia f?sica, una resonancia magn?tica ambulatoria de la columna lumbar, o una remisi?n ambulatoria a un especialista en columna si el dolor de espalda persiste.Regrese al departamento de emergencia si experimenta un empeoramiento o dolor incontrolable, dificultad para orinar o p?rdida de orina, incapacidad para controlar sus intestinos, nuevo entumecimiento o debilidad en sus piernas, entumecimiento entre sus piernas o en la zona de la ingle, incapacidad para caminar, fiebre/escalofr?os, o cualquier otro s?ntoma preocupante.--------------------------------------------You have been evaluated in the Emergency Department today for back pain. Your evaluation did not show evidence of medical conditions requiring emergent intervention at this time. I made you a follow up appointment to be seen at our urgent care this Wednesday at 6:00 p.m.SABRA- For pain at home you can take Tylenol , see dosing instructions below- Acetaminophen  (Tylenol ) 650 mg (two 325 mg pills) by mouth every 4-6 hours for pain relief as needed (take with food/water )- Do not exceed 3,000mg  of Tylenol  per 24 hours- You can also apply a lidocaine  patch to your back as needed for pain.  We recommend you leave it on for 12 hours and then take it off and leave it off for 12 hours to give your skin break.- You can also take Flexeril  if needed for muscle spasms/pain (May cause drowsiness. Do not take if you are going to drive or operate machinery.  Do not take with alcohol consumption). - Apply ice (put ice in a plastic bag and then into a pillow case) or heat to the area of pain and keep on for about 10 minutes. Do not apply directly to the skin. Must follow up with the primary doctor for further outpatient testing evaluation to confirm etiology of the back pain such as possible referral from your primary doctor to physical therapy, outpatient MRI of the lumbar spine, outpatient referral to a spine specialist if continued back pain.Return to the Emergency Department if you experience worsening or uncontrolled pain, difficulty urinating or leaking urine, inability to control your bowels, new numbness or weakness in your legs, numbness between your legs or in your groin area,  inability to walk, fevers/chills, or any other concerning symptoms.

## 2023-10-28 ENCOUNTER — Telehealth: Admit: 2023-10-28 | Payer: PRIVATE HEALTH INSURANCE | Primary: Internal Medicine

## 2023-10-28 MED FILL — FLUTICASONE PROPIONATE 50 MCG/ACTUATION NASAL SPRAY,SUSPENSION: 50 | NASAL | 30 days supply | Qty: 16 | Fill #5 | Status: CP

## 2023-10-28 NOTE — Telephone Encounter
 Spanish Interpreter ID: [id number] Left message with SW regarding L buttocks pain. Call placed to pt with interpreter. He reports the pain is was originally on R side but now it is both. He reports it is constant. Pain shoots down leg. He is set up to see spine team in October. Discussed safe medications to use and non-pharm methods including cold packs and stretching. Will use his PRN flexiril and encouraged he reach out to PCP in interm. He is agreeable to plan with teach-back.

## 2023-11-16 ENCOUNTER — Encounter
Admit: 2023-11-16 | Payer: PRIVATE HEALTH INSURANCE | Attending: Student in an Organized Health Care Education/Training Program | Primary: Internal Medicine

## 2023-11-16 DIAGNOSIS — I10 Essential (primary) hypertension: Secondary | ICD-10-CM

## 2023-11-16 DIAGNOSIS — Z940 Kidney transplant status: Principal | ICD-10-CM

## 2023-11-16 DIAGNOSIS — Z2989 Need for prophylactic immunotherapy: Secondary | ICD-10-CM

## 2023-11-17 MED ORDER — CARVEDILOL IMMEDIATE RELEASE 25 MG TABLET
25 | ORAL_TABLET | Freq: Two times a day (BID) | ORAL | 12 refills | 30.00000 days | Status: AC
Start: 2023-11-17 — End: ?
  Filled 2023-11-23: qty 60, 30d supply, fill #0

## 2023-11-18 ENCOUNTER — Encounter
Admit: 2023-11-18 | Payer: PRIVATE HEALTH INSURANCE | Attending: Student in an Organized Health Care Education/Training Program | Primary: Internal Medicine

## 2023-11-18 DIAGNOSIS — Z79899 Other long term (current) drug therapy: Principal | ICD-10-CM

## 2023-11-21 MED ORDER — MYCOPHENOLATE MOFETIL 250 MG CAPSULE
250 | ORAL_CAPSULE | Freq: Two times a day (BID) | ORAL | 12 refills | 40.00000 days | Status: AC
Start: 2023-11-21 — End: ?
  Filled 2023-11-23: qty 240, 40d supply, fill #0

## 2023-11-21 MED FILL — AMLODIPINE 5 MG TABLET: 5 mg | ORAL | 30 days supply | Qty: 30 | Fill #11 | Status: CP

## 2023-11-23 ENCOUNTER — Encounter: Admit: 2023-11-23 | Payer: PRIVATE HEALTH INSURANCE | Attending: Internal Medicine | Primary: Internal Medicine

## 2023-11-23 MED ORDER — LOSARTAN 100 MG TABLET
100 | ORAL_TABLET | Freq: Every day | ORAL | 4 refills | 90.00000 days | Status: AC
Start: 2023-11-23 — End: ?
  Filled 2023-11-25: qty 90, 90d supply, fill #0

## 2023-11-23 MED FILL — TACROLIMUS XR 4 MG TABLET,EXTENDED RELEASE 24 HR: 4 mg | ORAL | 30 days supply | Qty: 60 | Fill #4 | Status: CP

## 2023-11-23 MED FILL — PREDNISONE 5 MG TABLET: 5 mg | ORAL | 30 days supply | Qty: 30 | Fill #5 | Status: CP

## 2023-11-23 MED FILL — EMPAGLIFLOZIN 10 MG TABLET: 10 mg | ORAL | 90 days supply | Qty: 90 | Fill #1 | Status: CP

## 2023-11-23 MED FILL — TACROLIMUS XR 1 MG TABLET,EXTENDED RELEASE 24 HR: 1 mg | ORAL | 30 days supply | Qty: 30 | Fill #5 | Status: CP

## 2023-11-23 MED FILL — FLUTICASONE PROPIONATE 50 MCG/ACTUATION NASAL SPRAY,SUSPENSION: 50 | NASAL | 30 days supply | Qty: 16 | Fill #6 | Status: CP

## 2023-11-24 ENCOUNTER — Ambulatory Visit: Admit: 2023-11-24 | Payer: PRIVATE HEALTH INSURANCE | Attending: Internal Medicine | Primary: Internal Medicine

## 2023-12-13 ENCOUNTER — Encounter: Admit: 2023-12-13 | Payer: PRIVATE HEALTH INSURANCE | Primary: Internal Medicine

## 2023-12-14 ENCOUNTER — Encounter: Admit: 2023-12-14 | Payer: PRIVATE HEALTH INSURANCE | Primary: Internal Medicine

## 2023-12-14 MED ORDER — ONETOUCH VERIO TEST STRIPS
4 refills | 84.00000 days | Status: AC
Start: 2023-12-14 — End: ?

## 2023-12-15 ENCOUNTER — Encounter: Admit: 2023-12-15 | Payer: PRIVATE HEALTH INSURANCE | Primary: Internal Medicine

## 2023-12-16 ENCOUNTER — Encounter: Admit: 2023-12-16 | Payer: PRIVATE HEALTH INSURANCE | Primary: Internal Medicine

## 2023-12-16 MED FILL — DULAGLUTIDE 3 MG/0.5 ML SUBCUTANEOUS PEN INJECTOR: 3 | SUBCUTANEOUS | 84 days supply | Qty: 6 | Fill #1 | Status: CP

## 2023-12-16 MED FILL — OMEPRAZOLE 20 MG CAPSULE,DELAYED RELEASE: 20 mg | ORAL | 90 days supply | Qty: 90 | Fill #2 | Status: CP

## 2023-12-16 MED FILL — ATORVASTATIN 40 MG TABLET: 40 mg | ORAL | 90 days supply | Qty: 90 | Fill #3 | Status: CP

## 2023-12-17 ENCOUNTER — Encounter: Admit: 2023-12-17 | Payer: PRIVATE HEALTH INSURANCE | Primary: Internal Medicine

## 2023-12-17 ENCOUNTER — Encounter: Admit: 2023-12-17 | Payer: PRIVATE HEALTH INSURANCE | Attending: Internal Medicine | Primary: Internal Medicine

## 2023-12-19 ENCOUNTER — Encounter: Admit: 2023-12-19 | Payer: PRIVATE HEALTH INSURANCE | Primary: Internal Medicine

## 2023-12-19 ENCOUNTER — Ambulatory Visit: Admit: 2023-12-19 | Payer: PRIVATE HEALTH INSURANCE | Primary: Internal Medicine

## 2023-12-19 MED ORDER — AMLODIPINE 5 MG TABLET
5 | ORAL_TABLET | Freq: Every day | ORAL | 12 refills | 30.00000 days | Status: AC
Start: 2023-12-19 — End: ?
  Filled 2023-12-21: qty 30, 30d supply, fill #0

## 2023-12-20 ENCOUNTER — Encounter: Admit: 2023-12-20 | Payer: PRIVATE HEALTH INSURANCE | Primary: Internal Medicine

## 2023-12-21 ENCOUNTER — Encounter: Admit: 2023-12-21 | Payer: PRIVATE HEALTH INSURANCE | Primary: Internal Medicine

## 2023-12-23 ENCOUNTER — Encounter: Admit: 2023-12-23 | Payer: PRIVATE HEALTH INSURANCE | Primary: Internal Medicine

## 2023-12-26 ENCOUNTER — Encounter: Admit: 2023-12-26 | Payer: PRIVATE HEALTH INSURANCE | Primary: Internal Medicine

## 2023-12-27 ENCOUNTER — Encounter: Admit: 2023-12-27 | Payer: PRIVATE HEALTH INSURANCE | Primary: Internal Medicine

## 2023-12-28 ENCOUNTER — Ambulatory Visit: Admit: 2023-12-28 | Payer: PRIVATE HEALTH INSURANCE | Attending: Adult Health | Primary: Internal Medicine

## 2023-12-29 ENCOUNTER — Encounter: Admit: 2023-12-29 | Payer: PRIVATE HEALTH INSURANCE | Primary: Internal Medicine

## 2023-12-30 ENCOUNTER — Encounter: Admit: 2023-12-30 | Payer: PRIVATE HEALTH INSURANCE | Primary: Internal Medicine

## 2023-12-30 MED FILL — MYCOPHENOLATE MOFETIL 250 MG CAPSULE: 250 mg | ORAL | 40 days supply | Qty: 240 | Fill #1 | Status: CP

## 2023-12-30 MED FILL — CARVEDILOL IMMEDIATE RELEASE 25 MG TABLET: 25 mg | ORAL | 30 days supply | Qty: 60 | Fill #1 | Status: CP

## 2023-12-30 MED FILL — FLUTICASONE PROPIONATE 50 MCG/ACTUATION NASAL SPRAY,SUSPENSION: 50 | NASAL | 30 days supply | Qty: 16 | Fill #7 | Status: CP

## 2023-12-30 MED FILL — TACROLIMUS XR 1 MG TABLET,EXTENDED RELEASE 24 HR: 1 mg | ORAL | 30 days supply | Qty: 30 | Fill #6 | Status: CP

## 2023-12-30 MED FILL — PREDNISONE 5 MG TABLET: 5 mg | ORAL | 30 days supply | Qty: 30 | Fill #6 | Status: CP

## 2023-12-30 MED FILL — TACROLIMUS XR 4 MG TABLET,EXTENDED RELEASE 24 HR: 4 mg | ORAL | 30 days supply | Qty: 60 | Fill #5 | Status: CP

## 2023-12-31 ENCOUNTER — Encounter: Admit: 2023-12-31 | Payer: PRIVATE HEALTH INSURANCE | Primary: Internal Medicine

## 2024-01-03 ENCOUNTER — Inpatient Hospital Stay: Admit: 2024-01-03 | Discharge: 2024-01-03 | Payer: Medicare (Managed Care) | Primary: Internal Medicine

## 2024-01-03 ENCOUNTER — Ambulatory Visit: Admit: 2024-01-03 | Payer: PRIVATE HEALTH INSURANCE | Primary: Internal Medicine

## 2024-01-03 VITALS — BP 151/76 | HR 70 | Temp 97.00000°F | Ht 67.835 in | Wt 281.3 lb

## 2024-01-03 DIAGNOSIS — R809 Proteinuria, unspecified: Secondary | ICD-10-CM

## 2024-01-03 DIAGNOSIS — Z940 Kidney transplant status: Secondary | ICD-10-CM

## 2024-01-03 DIAGNOSIS — E119 Type 2 diabetes mellitus without complications: Secondary | ICD-10-CM

## 2024-01-03 DIAGNOSIS — I10 Essential (primary) hypertension: Principal | ICD-10-CM

## 2024-01-03 DIAGNOSIS — Z23 Encounter for immunization: Principal | ICD-10-CM

## 2024-01-03 DIAGNOSIS — Z7960 Long term (current) use of unspecified immunomodulators and immunosuppressants: Secondary | ICD-10-CM

## 2024-01-03 DIAGNOSIS — E781 Pure hyperglyceridemia: Secondary | ICD-10-CM

## 2024-01-03 DIAGNOSIS — E1165 Type 2 diabetes mellitus with hyperglycemia: Secondary | ICD-10-CM

## 2024-01-03 DIAGNOSIS — R933 Abnormal findings on diagnostic imaging of other parts of digestive tract: Secondary | ICD-10-CM

## 2024-01-03 DIAGNOSIS — G4733 Obstructive sleep apnea (adult) (pediatric): Secondary | ICD-10-CM

## 2024-01-03 DIAGNOSIS — Z79899 Other long term (current) drug therapy: Secondary | ICD-10-CM

## 2024-01-03 LAB — BASIC METABOLIC PANEL
BKR ANION GAP: 17 (ref 7–17)
BKR BLOOD UREA NITROGEN: 15 mg/dL (ref 8–23)
BKR BUN / CREAT RATIO: 9.4 (ref 8.0–23.0)
BKR CALCIUM: 9 mg/dL (ref 8.8–10.2)
BKR CHLORIDE: 99 mmol/L (ref 98–107)
BKR CO2: 26 mmol/L (ref 20–30)
BKR CREATININE DELTA: 0
BKR CREATININE: 1.6 mg/dL — ABNORMAL HIGH (ref 0.40–1.30)
BKR EGFR, CREATININE (CKD-EPI 2021): 49 mL/min/1.73m2 — ABNORMAL LOW (ref >=60)
BKR GLUCOSE: 91 mg/dL (ref 70–100)
BKR POTASSIUM: 3.7 mmol/L (ref 3.3–5.3)
BKR SODIUM: 142 mmol/L (ref 136–144)

## 2024-01-03 LAB — CBC WITH AUTO DIFFERENTIAL
BKR WAM ABSOLUTE IMMATURE GRANULOCYTES.: 0.01 x 1000/ÂµL (ref 0.00–0.30)
BKR WAM ABSOLUTE LYMPHOCYTE COUNT.: 0.79 x 1000/ÂµL (ref 0.60–3.70)
BKR WAM ABSOLUTE NRBC: 0 x 1000/ÂµL (ref 0.00–1.00)
BKR WAM ANC (ABSOLUTE NEUTROPHIL COUNT): 3.87 x 1000/ÂµL (ref 2.00–7.60)
BKR WAM BASOPHIL ABSOLUTE COUNT.: 0.03 x 1000/ÂµL (ref 0.00–1.00)
BKR WAM BASOPHILS: 0.6 % (ref 0.0–1.4)
BKR WAM EOSINOPHIL ABSOLUTE COUNT.: 0.09 x 1000/ÂµL (ref 0.00–1.00)
BKR WAM EOSINOPHILS: 1.7 % (ref 0.0–5.0)
BKR WAM HEMATOCRIT: 50 % (ref 38.50–50.00)
BKR WAM HEMOGLOBIN: 15.8 g/dL (ref 13.2–17.1)
BKR WAM IMMATURE GRANULOCYTES: 0.2 % (ref 0.0–1.0)
BKR WAM LYMPHOCYTES: 15.1 % — ABNORMAL LOW (ref 17.0–50.0)
BKR WAM MCH: 31 pg (ref 27.0–33.0)
BKR WAM MCHC: 31.6 g/dL (ref 31.0–36.0)
BKR WAM MCV: 98 fL (ref 80.0–100.0)
BKR WAM MONOCYTE ABSOLUTE COUNT.: 0.45 x 1000/ÂµL (ref 0.00–1.00)
BKR WAM MONOCYTES: 8.6 % (ref 4.0–12.0)
BKR WAM MPV: 10.2 fL (ref 8.0–12.0)
BKR WAM NEUTROPHILS: 73.8 % — ABNORMAL HIGH (ref 39.0–72.0)
BKR WAM NUCLEATED RED BLOOD CELLS: 0 % (ref 0.0–1.0)
BKR WAM PLATELETS: 174 x1000/ÂµL (ref 150–420)
BKR WAM RDW-CV: 13.2 % (ref 11.0–15.0)
BKR WAM RED BLOOD CELL COUNT.: 5.1 M/ÂµL (ref 4.00–6.00)
BKR WAM WHITE BLOOD CELL COUNT: 5.2 x1000/ÂµL (ref 4.0–11.0)

## 2024-01-03 LAB — ALBUMIN/CREATININE PANEL, URINE, RANDOM
BKR ALBUMIN, URINE, RANDOM: 578.3 mg/L
BKR ALBUMIN/CREATININE RATIO, URINE, RANDOM: 496.4 mg/g{creat} — ABNORMAL HIGH (ref <30.0)
BKR CREATININE, URINE, RANDOM: 117 mg/dL

## 2024-01-03 LAB — PROTEIN, TOTAL W/CREATININE, URINE, RANDOM     (BH GH LMW YH)
BKR CREATININE, URINE, RANDOM: 117 mg/dL
BKR PROTEIN URINE RANDOM: 0.83 g/L
BKR PROTEIN/CREATININE RATIO, URINE, RANDOM: 0.71 mg/mg{creat} — ABNORMAL HIGH (ref <0.10)

## 2024-01-03 LAB — MAGNESIUM: BKR MAGNESIUM: 1.7 mg/dL (ref 1.7–2.4)

## 2024-01-03 LAB — TACROLIMUS LEVEL     (BH GH L LMW YH): BKR TACROLIMUS BLOOD: 8.2 ng/mL

## 2024-01-03 LAB — PHOSPHORUS     (BH GH L LMW YH): BKR PHOSPHORUS: 4.5 mg/dL (ref 2.2–4.5)

## 2024-01-03 MED ORDER — FLU VACCINE TS 2025-26(6MOS UP)(PF) 45 MCG(15MCG X3)/0.5 ML IM SYRINGE
45 | INTRAMUSCULAR | Status: CP
Start: 2024-01-03 — End: ?
  Administered 2024-01-03: 12:00:00 45 mL via INTRAMUSCULAR

## 2024-01-05 LAB — HEMOGLOBIN A1C
BKR ESTIMATED AVERAGE GLUCOSE: 126 mg/dL
BKR HEMOGLOBIN A1C: 6 % — ABNORMAL HIGH (ref 4.0–5.6)

## 2024-01-05 NOTE — Progress Notes
 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 Bactrim  switched to AtovaquoneIntermittent pre-syncopal events, holter monitoring.Stent removal 9/15/20213/22: Biopsy: ACUTE CELLULAR REJECTION, BANFF TYPE IA 10/22: Biopsy: ACUTE TUBULAR INJURY, FOCAL9/30/2024: NO EVIDENCE OF ACUTE REJECTION                FOCAL AND SEGMENTAL GLOMERULOSCLEROSIS                ACUTE TUBULAR INJURY, DIFFUSE History of present illness: Spanish Interpreter: [id number] Adam Harper does not report symptoms referable to his transplanted kidney.He  reports compliance with his immunosuppressive medications and does not report drug related side effects.Adam Harper reports he does not check his Adam Harper pressure at home. He does not plan to. Of note, he did not take his AM meds prior to BP reading today. In addition he reports he is feeling well and has no complaints today. He denies specifically, fevers, diarrhea, dysuria, urinary urgency and pain over the allograft.________PFSH reviewed and confirmed 10/21/2025Review of Systems Patient denies any fevers or chills. No significant weight gain or weight loss.No headaches, neck stiffness, no vision changes, no ear or nose discharge and no oral lesions.No chest pain/ chest tightness, no cough/sputum or shortness of breath. No abdominal pain, N/V/D or constipationNo urinary complaints i.e. dysuria, hematuria, cloudy urine, reduced output. No skin rashes, no joint effusions.  No recent hospitalization, major illness. No recent h/o cancer. Medications Antihypertensives     Start End  amLODIPine  (NORVASC ) 5 mg tablet 12/19/2023 --  Sig - Route: Take 1 tablet (5 mg total) by mouth daily. - Oral  carvediloL  (COREG ) 25 mg Immediate Release tablet 11/17/2023 11/16/2024  Sig - Route: Take 1 tablet (25 mg total) by mouth 2 (two) times daily with breakfast and dinner. - Oral  losartan  (COZAAR ) 100 mg tablet 11/23/2023 --  Sig - Route: Take 1 tablet (100 mg total) by mouth daily. - Oral  Immunosuppression     Start End  mycophenolate  mofetil (CELLCEPT ) 250 mg capsule 11/21/2023 --  Sig - Route: Take 3 capsules (750 mg total) by mouth 2 (two) times daily. - Oral  Notes to Pharmacy: Kidney Transplant Z94.0.  No prior authorization was found for this prescription.  Found prior authorization for another prescription for the same medication: Approved  predniSONE  (DELTASONE ) 5 mg tablet 06/17/2023 --  Sig - Route: Take 1 tablet (5 mg total) by mouth daily. - Oral  Notes to Pharmacy: Kidney Transplant Z94.0  tacrolimus  XR (ENVARSUS  XR) 1 mg 24 hr tablet 06/21/2023 06/20/2024  Sig - Route: Take 1 tablet (1 mg total) by mouth daily. Take with 4 mg tabs for total dose 9 mg daily - Oral  Notes to Pharmacy: Kidney txp date: 10/16/2019  DX Code: Z94.0  No prior authorization was found for this prescription.  Found prior authorization for another prescription for the same medication: Approved  tacrolimus  XR (ENVARSUS  XR) 4 mg 24 hr tablet 07/20/2023 --  Sig - Route: Take 2 tablets (8 mg total) by mouth daily. Take with 1 mg tabs for total dose 9 mg daily - Oral  No prior authorization was found for this prescription.  Found prior authorization for another prescription for the same medication: Approved  Transplant Prophylaxis   None  Physical Exam   Vitals: BP Readings from Last 2 Encounters: 01/03/24 (!) 151/76 10/23/23 (!) 146/83 Wt Readings from Last 4 Encounters: 01/03/24 127.6 kg 10/22/23 124 kg 10/12/23 124 kg 08/23/23 125.6 kg GENERAL : Alert and oriented, appears well, fully conversantHEENT : EOMI, MMMRESPIRATORY : no increased work of breathing on room airEXTREMITIES : No edemaKarnofsky Status: 80% - Normal Activity with Effort: Some Symptoms of DiseaseLabs Lab Results Component Value Date  CREATININE 1.60 (H) 01/03/2024  BUN 15 01/03/2024  NA 142 01/03/2024  K 3.7 01/03/2024  CL 99 01/03/2024  CO2 26 01/03/2024  Lab Results Component Value Date  MG 1.7 01/03/2024 Lab Results Component Value Date  CALCIUM  9.0 01/03/2024 Lab Results Component Value Date  PHOS 4.5 01/03/2024 Lab Results Component Value Date  ALBCREATR 496.4 (H) 01/03/2024 Lab Results Component Value Date  PRCRRAU 0.71 (H) 01/03/2024 Lab Results Component Value Date  WBC 6.8 10/22/2023  HGB 14.4 10/22/2023  HCT 43.90 10/22/2023  MCV 95.2 10/22/2023  PLT 134 (L) 10/22/2023 Lab Results Component Value Date  TACROLIMUS  8.4 09/22/2023 Adam Harper is Lab Results Component Value Date  CYTOMEGALOVI Positive (A) 10/02/2019 Most recent BK viral testing -- BK Virus Quantitative PCR (no units) Date Value 05/17/2023 Not Detected 05/27/2022 Not Detected Most recent CMV viral testing -- Cytomegalovirus Quantitative PCR (no units) Date Value 01/19/2021 Not Detected Assessment / Plan Allograft Function	4y106m from kidney transplant 2/2 FSGS	Creatinine 1.6, which is at his baseline. Electrolytes and acid/base stable. UPCR  0.71, improved from 1.2. Continue to trend closely. ImmunosuppressionEnvarsus 9mg  daily MMF 750mg  BIDPrednisone 5mg  [id number]. The tacrolimus  level is 8.2. No changes made to dose. Adam Harper Pressure Management Adam Harper pressure was reviewed and is at goal control.  Therefore, no adjustments to the antihypertensive regimen were made at this visit.  I will continue to follow with each f/u visit.HematologyHemoglobin is in goal range. No changes made.MBD/ CKD	Ca/Phos reviewed and stable. Healthcare maintenance 	Defer to PCP	He is due for colonoscopy. Previous one had polyps seen. Referral placed.  Return to clinic in 3 months with labsAlexa Naomi Harper, APRNTransplant Nephrology10/21/2025 ----Electronically Signed by Adam JONELLE Smiling, APRN, January 03, 2024

## 2024-01-10 ENCOUNTER — Telehealth: Admit: 2024-01-10 | Payer: PRIVATE HEALTH INSURANCE | Primary: Internal Medicine

## 2024-01-10 NOTE — Telephone Encounter [36]
 Pt is scheduled for 02/23/24 [facility] with Dr. Llor

## 2024-01-13 ENCOUNTER — Encounter: Admit: 2024-01-13 | Payer: PRIVATE HEALTH INSURANCE | Primary: Internal Medicine

## 2024-01-16 ENCOUNTER — Encounter: Admit: 2024-01-16 | Payer: PRIVATE HEALTH INSURANCE | Primary: Internal Medicine

## 2024-01-17 ENCOUNTER — Encounter: Admit: 2024-01-17 | Payer: PRIVATE HEALTH INSURANCE | Primary: Internal Medicine

## 2024-01-17 MED FILL — AMLODIPINE 5 MG TABLET: 5 mg | ORAL | 30 days supply | Qty: 30 | Fill #1 | Status: CP

## 2024-01-17 MED FILL — MAGNESIUM OXIDE 400 MG (241.3 MG MAGNESIUM) TABLET: 400 mg (241.3 mg magnesium) | ORAL | 90 days supply | Qty: 360 | Fill #1 | Status: CP

## 2024-01-19 ENCOUNTER — Encounter: Admit: 2024-01-19 | Payer: PRIVATE HEALTH INSURANCE | Attending: Ophthalmology | Primary: Internal Medicine

## 2024-01-25 ENCOUNTER — Encounter: Admit: 2024-01-25 | Payer: PRIVATE HEALTH INSURANCE | Primary: Internal Medicine

## 2024-01-28 ENCOUNTER — Encounter: Admit: 2024-01-28 | Payer: PRIVATE HEALTH INSURANCE | Primary: Internal Medicine

## 2024-01-30 ENCOUNTER — Encounter: Admit: 2024-01-30 | Payer: PRIVATE HEALTH INSURANCE | Primary: Internal Medicine

## 2024-01-31 ENCOUNTER — Encounter: Admit: 2024-01-31 | Payer: PRIVATE HEALTH INSURANCE | Primary: Internal Medicine

## 2024-02-02 ENCOUNTER — Encounter
Admit: 2024-02-02 | Payer: PRIVATE HEALTH INSURANCE | Attending: Neurodevelopmental Disabilities | Primary: Internal Medicine

## 2024-02-03 ENCOUNTER — Encounter: Admit: 2024-02-03 | Payer: PRIVATE HEALTH INSURANCE | Primary: Internal Medicine

## 2024-02-04 ENCOUNTER — Encounter: Admit: 2024-02-04 | Payer: PRIVATE HEALTH INSURANCE | Primary: Internal Medicine

## 2024-02-04 MED ORDER — PEG-ELECTROLYTE SOLUTION 420 GRAM ORAL SOLUTION
420 | 1 refills | 2.00000 days | Status: AC
Start: 2024-02-04 — End: ?

## 2024-02-05 ENCOUNTER — Encounter: Admit: 2024-02-05 | Payer: PRIVATE HEALTH INSURANCE | Primary: Internal Medicine

## 2024-02-06 ENCOUNTER — Encounter: Admit: 2024-02-06 | Payer: PRIVATE HEALTH INSURANCE | Primary: Internal Medicine

## 2024-02-16 ENCOUNTER — Encounter: Admit: 2024-02-16 | Payer: PRIVATE HEALTH INSURANCE | Primary: Internal Medicine

## 2024-02-17 ENCOUNTER — Encounter: Admit: 2024-02-17 | Payer: PRIVATE HEALTH INSURANCE | Primary: Internal Medicine

## 2024-02-17 MED FILL — MYCOPHENOLATE MOFETIL 250 MG CAPSULE: 250 mg | ORAL | 40 days supply | Qty: 240 | Fill #2 | Status: CP

## 2024-02-17 MED FILL — AMLODIPINE 5 MG TABLET: 5 mg | ORAL | 30 days supply | Qty: 30 | Fill #2 | Status: CP

## 2024-02-17 MED FILL — PEG-ELECTROLYTE SOLUTION 420 GRAM ORAL SOLUTION: 420 g | ORAL | 1 days supply | Qty: 4000 | Fill #0 | Status: CP

## 2024-02-17 MED FILL — TACROLIMUS XR 1 MG TABLET,EXTENDED RELEASE 24 HR: 1 mg | ORAL | 30 days supply | Qty: 30 | Fill #7 | Status: CP

## 2024-02-17 MED FILL — LOSARTAN 100 MG TABLET: 100 mg | ORAL | 90 days supply | Qty: 90 | Fill #1 | Status: CP

## 2024-02-17 MED FILL — CARVEDILOL IMMEDIATE RELEASE 25 MG TABLET: 25 mg | ORAL | 30 days supply | Qty: 60 | Fill #2 | Status: CP

## 2024-02-17 MED FILL — PREDNISONE 5 MG TABLET: 5 mg | ORAL | 30 days supply | Qty: 30 | Fill #7 | Status: CP

## 2024-02-18 NOTE — Preprocedure [[id number]]
 The first attempt to conduct a pre-procedure call for colonocopy scheduled for 02/23/2024.  No patient contact was made; a voice message was left to return the phone call to the GI Navigation Department. Jon Ferdinand, RN

## 2024-02-20 ENCOUNTER — Inpatient Hospital Stay: Admit: 2024-02-20 | Discharge: 2024-02-20 | Payer: Medicare (Managed Care) | Primary: Internal Medicine

## 2024-02-20 ENCOUNTER — Ambulatory Visit
Admit: 2024-02-20 | Payer: PRIVATE HEALTH INSURANCE | Attending: Student in an Organized Health Care Education/Training Program | Primary: Internal Medicine

## 2024-02-20 ENCOUNTER — Encounter: Admit: 2024-02-20 | Payer: PRIVATE HEALTH INSURANCE | Primary: Internal Medicine

## 2024-02-20 VITALS — BP 135/68 | HR 70 | Wt 279.1 lb

## 2024-02-20 DIAGNOSIS — E1165 Type 2 diabetes mellitus with hyperglycemia: Principal | ICD-10-CM

## 2024-02-20 DIAGNOSIS — Z940 Kidney transplant status: Secondary | ICD-10-CM

## 2024-02-20 DIAGNOSIS — Z7960 Long term (current) use of unspecified immunomodulators and immunosuppressants: Secondary | ICD-10-CM

## 2024-02-20 LAB — CBC WITH AUTO DIFFERENTIAL
BKR WAM ABSOLUTE IMMATURE GRANULOCYTES.: 0.03 x 1000/ÂµL (ref 0.00–0.30)
BKR WAM ABSOLUTE LYMPHOCYTE COUNT.: 0.91 x 1000/ÂµL (ref 0.60–3.70)
BKR WAM ABSOLUTE NRBC: 0 x 1000/ÂµL (ref 0.00–1.00)
BKR WAM ANC (ABSOLUTE NEUTROPHIL COUNT): 4.48 x 1000/ÂµL (ref 2.00–7.60)
BKR WAM BASOPHIL ABSOLUTE COUNT.: 0.02 x 1000/ÂµL (ref 0.00–1.00)
BKR WAM BASOPHILS: 0.3 % (ref 0.0–1.4)
BKR WAM EOSINOPHIL ABSOLUTE COUNT.: 0.12 x 1000/ÂµL (ref 0.00–1.00)
BKR WAM EOSINOPHILS: 1.9 % (ref 0.0–5.0)
BKR WAM HEMATOCRIT: 48 % (ref 38.50–50.00)
BKR WAM HEMOGLOBIN: 15.5 g/dL (ref 13.2–17.1)
BKR WAM IMMATURE GRANULOCYTES: 0.5 % (ref 0.0–1.0)
BKR WAM LYMPHOCYTES: 14.7 % — ABNORMAL LOW (ref 17.0–50.0)
BKR WAM MCH: 31.6 pg (ref 27.0–33.0)
BKR WAM MCHC: 32.3 g/dL (ref 31.0–36.0)
BKR WAM MCV: 98 fL (ref 80.0–100.0)
BKR WAM MONOCYTE ABSOLUTE COUNT.: 0.62 x 1000/ÂµL (ref 0.00–1.00)
BKR WAM MONOCYTES: 10 % (ref 4.0–12.0)
BKR WAM MPV: 10.4 fL (ref 8.0–12.0)
BKR WAM NEUTROPHILS: 72.6 % — ABNORMAL HIGH (ref 39.0–72.0)
BKR WAM NUCLEATED RED BLOOD CELLS: 0 % (ref 0.0–1.0)
BKR WAM PLATELETS: 183 x1000/ÂµL (ref 150–420)
BKR WAM RDW-CV: 13.2 % (ref 11.0–15.0)
BKR WAM RED BLOOD CELL COUNT.: 4.9 M/ÂµL (ref 4.00–6.00)
BKR WAM WHITE BLOOD CELL COUNT: 6.2 x1000/ÂµL (ref 4.0–11.0)

## 2024-02-20 LAB — PROTEIN, TOTAL W/CREATININE, URINE, RANDOM     (BH GH LMW YH)
BKR CREATININE, URINE, RANDOM: 148 mg/dL
BKR PROTEIN URINE RANDOM: 1.14 g/L
BKR PROTEIN/CREATININE RATIO, URINE, RANDOM: 0.77 mg/mg{creat} — ABNORMAL HIGH (ref <0.10)

## 2024-02-20 LAB — BASIC METABOLIC PANEL
BKR ANION GAP: 15 (ref 7–17)
BKR BLOOD UREA NITROGEN: 15 mg/dL (ref 8–23)
BKR BUN / CREAT RATIO: 8.8 (ref 8.0–23.0)
BKR CALCIUM: 8.3 mg/dL — ABNORMAL LOW (ref 8.8–10.2)
BKR CHLORIDE: 101 mmol/L (ref 98–107)
BKR CO2: 27 mmol/L (ref 20–30)
BKR CREATININE DELTA: 0.1
BKR CREATININE: 1.7 mg/dL — ABNORMAL HIGH (ref 0.40–1.30)
BKR EGFR, CREATININE (CKD-EPI 2021): 45 mL/min/1.73m2 — ABNORMAL LOW (ref >=60)
BKR GLUCOSE: 102 mg/dL — ABNORMAL HIGH (ref 70–100)
BKR POTASSIUM: 3.8 mmol/L (ref 3.3–5.3)
BKR SODIUM: 143 mmol/L (ref 136–144)

## 2024-02-20 LAB — PHOSPHORUS     (BH GH L LMW YH): BKR PHOSPHORUS: 4.1 mg/dL (ref 2.2–4.5)

## 2024-02-20 LAB — ALBUMIN/CREATININE PANEL, URINE, RANDOM
BKR ALBUMIN, URINE, RANDOM: 755.9 mg/L
BKR ALBUMIN/CREATININE RATIO, URINE, RANDOM: 512.1 mg/g{creat} — ABNORMAL HIGH (ref <30.0)
BKR CREATININE, URINE, RANDOM: 148 mg/dL

## 2024-02-20 LAB — MAGNESIUM: BKR MAGNESIUM: 1.9 mg/dL (ref 1.7–2.4)

## 2024-02-20 LAB — TACROLIMUS LEVEL     (BH GH L LMW YH): BKR TACROLIMUS BLOOD: 8.6 ng/mL

## 2024-02-20 MED ORDER — EMPAGLIFLOZIN 10 MG TABLET
10 | ORAL_TABLET | Freq: Every day | ORAL | 2 refills | 90.00000 days | Status: AC
Start: 2024-02-20 — End: ?
  Filled 2024-02-22: qty 90, 90d supply, fill #0

## 2024-02-20 NOTE — Progress Notes [1]
 Transplant-Endocrinology Clinic HPI: Adam Harper is a 63 y.o. 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  He has no complications from diabetes. Interim Hx:He has been doing well. Jardiance  was initiated in our last appt. Plans to do a colonoscopy. Already has instruction for the medications. Was walking 2 miles a day during the summer but stopped as it is winter now. Hypoglycemia: No severe hypoglycemia requiring assistanceCurrent Regimen:Trulicity  3mg  weeklyJardiance 10mg  dailyPrevious Regimen: noneBlood sugar review:BG review: GlucometerNot testing regularly; 103 last time he checked. Serum glucose on lab 91 on last testMeds:Current Medications Medication Sig  amLODIPine  (NORVASC ) 5 mg tablet Take 1 tablet (5 mg total) by mouth daily.  atorvastatin  (LIPITOR) 40 mg tablet Take 1 tablet (40 mg total) by mouth daily.  carvedilol  (COREG ) 25 mg Immediate Release tablet Take 1 tablet (25 mg total) by mouth 2 (two) times daily with breakfast and dinner.  dulaglutide  (TRULICITY ) 3 mg/0.5 mL Pen Injector Inject 0.5 mLs (3 mg total) under the skin every 7 days.  empagliflozin  (JARDIANCE ) 10 mg tablet Take 1 tablet (10 mg total) by mouth daily.  losartan  (COZAAR ) 100 mg tablet Take 1 tablet (100 mg total) by mouth daily.  magnesium  oxide (MAG-OX) 400 mg (241.3 mg magnesium ) tablet Take 2 tablets (800 mg total) by mouth 2 (two) times daily.  mycophenolate  mofetil (CELLCEPT ) 250 mg capsule Take 3 capsules (750 mg total) by mouth 2 (two) times daily.  omeprazole  (PRILOSEC) 20 mg capsule Take 1 capsule (20 mg total) by mouth daily.  polyethylene glycol-electrolytes (NULYTELY ) 420 gram solution Please take as directed by clinician: Procedure date: 02/23/2024  predniSONE  (DELTASONE ) 5 mg tablet Take 1 tablet (5 mg total) by mouth daily.  sodium chloride  (OCEAN) 0.65 % nasal spray Use 2 sprays in each nostril nightly.  tacrolimus  XR (ENVARSUS  XR) 1 mg 24 hr tablet Take 1 tablet (1 mg total) by mouth daily. Take with 4 mg tabs for total dose 9 mg daily  tacrolimus  XR (ENVARSUS  XR) 4 mg 24 hr tablet Take 2 tablets (8 mg total) by mouth daily. Take with 1 mg tabs for total dose 9 mg daily  tadalafiL  (CIALIS ) 20 mg tablet Take 1 tablet (20 mg total) by mouth every other day as needed for erectile dysfunction. Allergies: Patient has no known allergies.ROS: The ROS has been reviewed and, except for where noted above, the remaining 12-point systems review was otherwise negative. Last 6 weights   Weight (lb) Weight (kg) 05/24/2023 281 lb 14.4 oz  127.869 kg  06/16/2023 279 lb  126.554 kg  08/22/2023 276 lb 0.3 oz  125.2 kg  08/23/2023 276 lb 14.4 oz  125.6 kg  10/12/2023 273 lb 6.4 oz  124.013 kg  10/22/2023 273 lb 5.9 oz  124 kg  01/03/2024 281 lb 4.9 oz  127.6 kg  02/20/2024 279 lb 1.6 oz  126.6 kg  PE: BP 135/68 (Site: l a, Position: Sitting, Cuff Size: Large)  - Pulse 70  - Wt 126.6 kg  - SpO2 100%  - BMI 42.64 kg/m? Gen: No acute distressLungs: Clear to auscultation bilaterallyNeurologic: CN 2-12 grossly intact. Skin: No darkening of skin, no acanthosis, no striaeExt: 	LE: no bruising, proximal muscle weakness, or edema Labs/Imaging:Lab Results Component Value Date  HGBA1C 6.0 (H) 01/03/2024  HGBA1C 6.1 (H) 09/03/2022  HGBA1C 6.2 (H) 11/13/2021  CHOL 113 11/18/2022  LDL 44 11/18/2022  HDL 33 (L) 11/18/2022  TRIG 230 (H) 11/18/2022  TSH 1.650 09/03/2022  HGB 15.5 02/20/2024  CREATININE 1.60 (H) 01/03/2024  EGFRNONAFRAM 36 09/25/2020  EGFRAFRAMER 44 09/25/2020  ALBURR 755.9 02/20/2024  ALBCREATR 512.1 (H) 02/20/2024  AST 15 11/18/2019  ALT 16 11/18/2019 No results found for: GAD-65 AB, IA-2 ANTIBODY, GAD65, IA-2, C PEPTIDE, ISLET CELL ANTIBODY SCREEN, GAD65, IA-2, AND INSULIN  AUTOANTIBODY Impression: Adam Harper is presenting today as a follow up. A1c 6.0% Plan to continue his regimen as is. All questions answered today. Recommendations:#Diabetes- Trulicity  3mg  weekly- Continue Jardiance  10mg  daily- Target Fasting BG 80-140- Check BS 1X/day. Check BS before driving and eat a snack if BS <100. Keep organized log and bring it next time. Carry glucose tablets or snacks in case of hypoglycemia.- Diabetic diet, moderate intensity exercise 150 minutes/week, weight loss 5-10% of body weight.- F/u with ophthalmology for complete eye exam periodically.RTC 6 months Mychart message okayElectronically Signed by Cheron Tamea Jubilee, MD

## 2024-02-20 NOTE — Telephone Communication [[id number]]
 Patient is scheduled for Colonoscopy on 02/23/24 at [facility]. Pt contacted; assisted by interpreter, [place], [place] #[id number]. Pt unable to talk at this time. Pt does confirm that he is taking Jardiance  and took dose this morning. Pt instructed to hold Jardiance  until after procedure. Will call pt at 8:30 AM tomorrow to complete pre-call. Procedure pass message sent to APP provider pool to determine if pt ok to proceed with colonoscopy while holding Jardiance  x 2 days.

## 2024-02-21 ENCOUNTER — Encounter: Admit: 2024-02-21 | Payer: PRIVATE HEALTH INSURANCE | Primary: Internal Medicine

## 2024-02-21 ENCOUNTER — Encounter: Admit: 2024-02-21 | Payer: PRIVATE HEALTH INSURANCE | Attending: Gastroenterology | Primary: Internal Medicine

## 2024-02-21 ENCOUNTER — Ambulatory Visit: Admit: 2024-02-21 | Payer: Medicare (Managed Care) | Attending: Anesthesiology | Primary: Internal Medicine

## 2024-02-21 DIAGNOSIS — N2581 Secondary hyperparathyroidism of renal origin: Secondary | ICD-10-CM

## 2024-02-21 DIAGNOSIS — E213 Hyperparathyroidism, unspecified: Secondary | ICD-10-CM

## 2024-02-21 DIAGNOSIS — T827XXA Infection and inflammatory reaction due to other cardiac and vascular devices, implants and grafts, initial encounter: Secondary | ICD-10-CM

## 2024-02-21 DIAGNOSIS — Z940 Kidney transplant status: Principal | ICD-10-CM

## 2024-02-21 DIAGNOSIS — E119 Type 2 diabetes mellitus without complications: Secondary | ICD-10-CM

## 2024-02-21 DIAGNOSIS — Z79899 Other long term (current) drug therapy: Secondary | ICD-10-CM

## 2024-02-21 DIAGNOSIS — I770 Arteriovenous fistula, acquired: Secondary | ICD-10-CM

## 2024-02-21 DIAGNOSIS — K219 Gastro-esophageal reflux disease without esophagitis: Secondary | ICD-10-CM

## 2024-02-21 DIAGNOSIS — N186 End stage renal disease: Secondary | ICD-10-CM

## 2024-02-21 DIAGNOSIS — G4733 Obstructive sleep apnea (adult) (pediatric): Secondary | ICD-10-CM

## 2024-02-21 DIAGNOSIS — I712 Thoracic aortic aneurysm (HC Code): Secondary | ICD-10-CM

## 2024-02-21 DIAGNOSIS — K7460 Unspecified cirrhosis of liver: Secondary | ICD-10-CM

## 2024-02-21 DIAGNOSIS — J101 Influenza due to other identified influenza virus with other respiratory manifestations: Secondary | ICD-10-CM

## 2024-02-21 DIAGNOSIS — I10 Essential (primary) hypertension: Principal | ICD-10-CM

## 2024-02-21 DIAGNOSIS — D649 Anemia, unspecified: Secondary | ICD-10-CM

## 2024-02-21 DIAGNOSIS — E7800 Pure hypercholesterolemia, unspecified: Secondary | ICD-10-CM

## 2024-02-21 MED ORDER — TACROLIMUS XR 4 MG TABLET,EXTENDED RELEASE 24 HR
4 | ORAL_TABLET | Freq: Every day | ORAL | 12 refills | 30.00000 days | Status: AC
Start: 2024-02-21 — End: ?
  Filled 2024-02-22: qty 60, 30d supply, fill #6
  Filled 2024-03-20: qty 60, 30d supply, fill #0

## 2024-02-21 MED ORDER — ONETOUCH DELICA PLUS LANCET 33 GAUGE
33 | Status: AC
Start: 2024-02-21 — End: ?

## 2024-02-21 NOTE — Anesthesia PSE Note [[id number]]
 ReviewReview NP: Vernell Morgans

## 2024-02-21 NOTE — Telephone Encounter [36]
 Envarsus  Dose Change:Per Rosina PA Envarsus  dose decreased to 8 mg daily. Rx updated. I called  Sheree DELENA Fila with dose change instructions. Patient verbalized understanding and repeated instructions. Patient agrees with plan and will adjust dose accordingly.Electronically Signed by Daphne Rote, RN, February 21, 2024

## 2024-02-21 NOTE — Telephone Communication [[id number]]
 Patient is scheduled for Colonoscopy on 02/23/24 at [facility]. Pre procedure call performed. Assisted by Spanish interpreter, [place], [place] #[id number]. Pt confirms that he has received bowel prep. Verbally reviewed Nulytely /pre-procedure instructions with pt and informed pt that Spanish instructions were sent to his MyChart on 02/04/24 and are available for review. Discharge transportation policy & NPO instructions given per pre procedure guidelines. Patient instructed to take Carvedilol , Atorvastatin , Amlodipine , Prednisone , Cellcept , Tacrolimus  and Omeprazole  with a sip of water  on the morning of scheduled procedure. Patient verbalizes understanding.Patient confirms history of diabetes; provided patient with instructions per the [facility] pre procedure diabetes protocol. Pt reports last dose of Jardiance  taken on 02/20/24. Pt instructed to hold Jardiance  until after procedure. Per Standley Gaskins, APRN, ok to proceed with 2 day Jardiance  hold. Patient reports taking Trulicity . Patient advised to maintain a clear liquid diet for at least 24 hours prior to scheduled procedure and instructed to hold Trulicity  (for both daily and weekly GLP-1RA dosing) on the day of procedure per the /American Society of Anesthesiologists pre procedure guidelines. Pt instructed to monitor blood glucose while on clear liquid diet the day before and morning of procedure. Pt informed that procedure is subject to cancellation if blood glucose is >300 mg/dL on the day of procedure. Pt verbalizes understanding. A total of 57 minutes was spent reviewing medical history and instructions with assistance from [employer]. Addendum 02/23/24 at 1744: Per US  Hemodialysis Access on 06/28/23,  Nonocclusive thrombus mural calcification is noted within the fistula at the level of the mid and upper humerus. Pt not taking blood thinners at this time. Notified Standley Gaskins, APRN.

## 2024-02-22 ENCOUNTER — Encounter: Admit: 2024-02-22 | Payer: PRIVATE HEALTH INSURANCE | Primary: Internal Medicine

## 2024-02-23 ENCOUNTER — Ambulatory Visit: Admit: 2024-02-23 | Payer: Medicare (Managed Care) | Attending: Anesthesiology | Primary: Internal Medicine

## 2024-02-23 ENCOUNTER — Inpatient Hospital Stay
Admit: 2024-02-23 | Discharge: 2024-02-23 | Payer: PRIVATE HEALTH INSURANCE | Attending: Gastroenterology | Primary: Internal Medicine

## 2024-02-23 ENCOUNTER — Encounter: Admit: 2024-02-23 | Payer: PRIVATE HEALTH INSURANCE | Attending: Gastroenterology | Primary: Internal Medicine

## 2024-02-23 ENCOUNTER — Encounter: Admit: 2024-02-23 | Payer: PRIVATE HEALTH INSURANCE | Primary: Internal Medicine

## 2024-02-23 DIAGNOSIS — I120 Hypertensive chronic kidney disease with stage 5 chronic kidney disease or end stage renal disease: Secondary | ICD-10-CM

## 2024-02-23 DIAGNOSIS — Z7984 Long term (current) use of oral hypoglycemic drugs: Secondary | ICD-10-CM

## 2024-02-23 DIAGNOSIS — Z8679 Personal history of other diseases of the circulatory system: Secondary | ICD-10-CM

## 2024-02-23 DIAGNOSIS — G4733 Obstructive sleep apnea (adult) (pediatric): Secondary | ICD-10-CM

## 2024-02-23 DIAGNOSIS — Z6841 Body Mass Index (BMI) 40.0 and over, adult: Secondary | ICD-10-CM

## 2024-02-23 DIAGNOSIS — Z7985 Long-term (current) use of injectable non-insulin antidiabetic drugs: Secondary | ICD-10-CM

## 2024-02-23 DIAGNOSIS — E7800 Pure hypercholesterolemia, unspecified: Secondary | ICD-10-CM

## 2024-02-23 DIAGNOSIS — N2581 Secondary hyperparathyroidism of renal origin: Secondary | ICD-10-CM

## 2024-02-23 DIAGNOSIS — Z992 Dependence on renal dialysis: Secondary | ICD-10-CM

## 2024-02-23 DIAGNOSIS — Z860101 Personal history of adenomatous and serrated colon polyps: Secondary | ICD-10-CM

## 2024-02-23 DIAGNOSIS — K219 Gastro-esophageal reflux disease without esophagitis: Secondary | ICD-10-CM

## 2024-02-23 DIAGNOSIS — I447 Left bundle-branch block, unspecified: Secondary | ICD-10-CM

## 2024-02-23 DIAGNOSIS — D649 Anemia, unspecified: Secondary | ICD-10-CM

## 2024-02-23 DIAGNOSIS — T827XXA Infection and inflammatory reaction due to other cardiac and vascular devices, implants and grafts, initial encounter: Secondary | ICD-10-CM

## 2024-02-23 DIAGNOSIS — K648 Other hemorrhoids: Secondary | ICD-10-CM

## 2024-02-23 DIAGNOSIS — Z79624 Long term (current) use of inhibitors of nucleotide synthesis: Secondary | ICD-10-CM

## 2024-02-23 DIAGNOSIS — E119 Type 2 diabetes mellitus without complications: Secondary | ICD-10-CM

## 2024-02-23 DIAGNOSIS — Z940 Kidney transplant status: Secondary | ICD-10-CM

## 2024-02-23 DIAGNOSIS — E213 Hyperparathyroidism, unspecified: Secondary | ICD-10-CM

## 2024-02-23 DIAGNOSIS — Z7952 Long term (current) use of systemic steroids: Secondary | ICD-10-CM

## 2024-02-23 DIAGNOSIS — Z79899 Other long term (current) drug therapy: Secondary | ICD-10-CM

## 2024-02-23 DIAGNOSIS — K7460 Unspecified cirrhosis of liver: Secondary | ICD-10-CM

## 2024-02-23 DIAGNOSIS — I770 Arteriovenous fistula, acquired: Secondary | ICD-10-CM

## 2024-02-23 DIAGNOSIS — Z79621 Long term (current) use of calcineurin inhibitor: Secondary | ICD-10-CM

## 2024-02-23 DIAGNOSIS — Z9889 Other specified postprocedural states: Secondary | ICD-10-CM

## 2024-02-23 DIAGNOSIS — E1122 Type 2 diabetes mellitus with diabetic chronic kidney disease: Secondary | ICD-10-CM

## 2024-02-23 DIAGNOSIS — J101 Influenza due to other identified influenza virus with other respiratory manifestations: Secondary | ICD-10-CM

## 2024-02-23 DIAGNOSIS — I10 Essential (primary) hypertension: Principal | ICD-10-CM

## 2024-02-23 DIAGNOSIS — Z1211 Encounter for screening for malignant neoplasm of colon: Principal | ICD-10-CM

## 2024-02-23 DIAGNOSIS — N186 End stage renal disease: Secondary | ICD-10-CM

## 2024-02-23 DIAGNOSIS — Z9089 Acquired absence of other organs: Secondary | ICD-10-CM

## 2024-02-23 DIAGNOSIS — Z87891 Personal history of nicotine dependence: Secondary | ICD-10-CM

## 2024-02-23 DIAGNOSIS — I712 Thoracic aortic aneurysm (HC Code): Secondary | ICD-10-CM

## 2024-02-23 MED ORDER — KETAMINE 50 MG/ML (1 ML) INTRAVENOUS SYRINGE
50 | INTRAVENOUS | Status: DC | PRN
Start: 2024-02-23 — End: 2024-02-23
  Administered 2024-02-23 (×2): 50 mg/mL via INTRAVENOUS

## 2024-02-23 MED ORDER — LACTATED RINGERS INTRAVENOUS SOLUTION
INTRAVENOUS | Status: DC | PRN
Start: 2024-02-23 — End: 2024-02-23
  Administered 2024-02-23: 08:00:00 via INTRAVENOUS

## 2024-02-23 MED ORDER — ONDANSETRON HCL (PF) 4 MG/2 ML INJECTION SOLUTION
4 | INTRAVENOUS | Status: DC | PRN
Start: 2024-02-23 — End: 2024-02-23
  Administered 2024-02-23: 08:00:00 4 via INTRAVENOUS

## 2024-02-23 MED ORDER — PROPOFOL 10 MG/ML INTRAVENOUS EMULSION
10 | INTRAVENOUS | Status: DC | PRN
Start: 2024-02-23 — End: 2024-02-23
  Administered 2024-02-23 (×2): 10 mg/mL via INTRAVENOUS

## 2024-02-23 MED ORDER — LACTATED RINGERS INTRAVENOUS SOLUTION
INTRAVENOUS | Status: DC | PRN
Start: 2024-02-23 — End: 2024-02-23

## 2024-02-23 MED ORDER — LACTATED RINGERS INTRAVENOUS SOLUTION
INTRAVENOUS | Status: DC
Start: 2024-02-23 — End: 2024-02-23
  Administered 2024-02-23: 08:00:00 1000.000 mL/h via INTRAVENOUS

## 2024-02-23 MED ORDER — LIDOCAINE (PF) 20 MG/ML (2 %) INTRAVENOUS SOLUTION
20 | INTRAVENOUS | Status: DC | PRN
Start: 2024-02-23 — End: 2024-02-23
  Administered 2024-02-23 (×2): 20 mg/mL (2 %) via INTRAVENOUS

## 2024-02-23 MED ORDER — SODIUM CHLORIDE 0.9 % (FLUSH) INJECTION SYRINGE
0.9 | INTRAVENOUS | Status: DC | PRN
Start: 2024-02-23 — End: 2024-02-23

## 2024-02-23 MED ORDER — KETAMINE 50 MG/ML (1 ML) INTRAVENOUS SYRINGE
50 | Status: CP
Start: 2024-02-23 — End: ?

## 2024-02-23 MED ORDER — PROPOFOL 10 MG/ML INTRAVENOUS EMULSION
10 | INTRAVENOUS | Status: DC | PRN
Start: 2024-02-23 — End: 2024-02-23
  Administered 2024-02-23 (×2): 10 mL/h via INTRAVENOUS

## 2024-02-23 MED ORDER — SODIUM CHLORIDE 0.9 % (FLUSH) INJECTION SYRINGE
0.9 | Freq: Three times a day (TID) | INTRAVENOUS | Status: DC
Start: 2024-02-23 — End: 2024-02-23
  Administered 2024-02-23: 08:00:00 0.9 mL via INTRAVENOUS

## 2024-02-23 NOTE — Progress Notes [1]
 CC: 63 yo male here for colonoscopyHPI: 63 yo male with past colonoscopy on 08-10-2018 showing  five small polyps removed, one being a TANo Family History of cancer that he knows ofPast Medical History:Diagnosis Date  A-V fistula   right arm  Anemia   Anemia in ESRD (end-stage renal disease) (HC Code) 10/24/2014  Added automatically from request for surgery [id number]  Added automatically from request for surgery [id number] Overview:  Added automatically from request for surgery [id number]  Anemia in ESRD (end-stage renal disease) (HC Code)  (HC CODE) 10/24/2014  Formatting of this note might be different from the original. Overview:  Added automatically from request for surgery [id number] Overview:  Added automatically from request for surgery [id number]  AV fistula infection (HC Code)   Cirrhosis (HC Code)  (HC CODE)   Diabetes mellitus (HC CODE)   ESRD on hemodialysis (HC Code)  (HC CODE)   Mon-Wed-Fri  GERD (gastroesophageal reflux disease)   Hypercholesteremia   Hyperparathyroidism (HC Code)   Hypertension   Infected prosthetic vascular graft, initial encounter (HC Code) 10/24/2014  Influenza A   Morbid obesity (HC Code) (HC CODE) 02/03/2018  Obstructive sleep apnea   02/21/24- pt reports that the has not used C-PAP in months  Secondary hyperparathyroidism of renal origin (HC Code)   Thoracic aortic aneurysm (HC Code)  Past Surgical History:Procedure Laterality Date  AV FISTULA PLACEMENT Left   left lower arm unsuccessful, then moved to upper arm 2011, then had problems in 10/2013 - infection  COLONOSCOPY    KIDNEY TRANSPLANT  2021  PARATHYROIDECTOMY   No current facility-administered medications on file prior to encounter. Current Outpatient Medications on File Prior to Encounter Medication Sig Dispense Refill  amLODIPine  (NORVASC ) 5 mg tablet Take 1 tablet (5 mg total) by mouth daily. 30 tablet 11  atorvastatin  (LIPITOR) 40 mg tablet Take 1 tablet (40 mg total) by mouth daily. 90 tablet 3  carvedilol  (COREG ) 25 mg Immediate Release tablet Take 1 tablet (25 mg total) by mouth 2 (two) times daily with breakfast and dinner. 60 tablet 11  dulaglutide  (TRULICITY ) 3 mg/0.5 mL Pen Injector Inject 0.5 mLs (3 mg total) under the skin every 7 days. 6 mL 3  fluticasone  propionate (FLONASE ) 50 mcg/actuation nasal spray Use 2 sprays in each nostril daily. Use for at least 4-6 weeks to see any improvement 16 g 11  losartan  (COZAAR ) 100 mg tablet Take 1 tablet (100 mg total) by mouth daily. 90 tablet 3  magnesium  oxide (MAG-OX) 400 mg (241.3 mg magnesium ) tablet Take 2 tablets (800 mg total) by mouth 2 (two) times daily. 360 tablet 3  mycophenolate  mofetil (CELLCEPT ) 250 mg capsule Take 3 capsules (750 mg total) by mouth 2 (two) times daily. 240 capsule 11  omeprazole  (PRILOSEC) 20 mg capsule Take 1 capsule (20 mg total) by mouth daily. 90 capsule 3  predniSONE  (DELTASONE ) 5 mg tablet Take 1 tablet (5 mg total) by mouth daily. 30 tablet 11  sodium chloride  (OCEAN) 0.65 % nasal spray Use 2 sprays in each nostril nightly. 88 mL 12  tacrolimus  XR (ENVARSUS  XR) 1 mg 24 hr tablet Take 1 tablet (1 mg total) by mouth daily. Take with 4 mg tabs for total dose 9 mg daily 30 tablet 11  tacrolimus  XR (ENVARSUS  XR) 4 mg 24 hr tablet Take 2 tablets (8 mg total) by mouth daily. Take with 1 mg tabs for total dose 9 mg daily 60 tablet 11  blood sugar diagnostic test strips Use to check blood  sugar daily [Type 2 Diabetes: E11.9]. Please dispense supplies covered by patient's insurance. 100 each 3  lancets Use to check blood sugar daily [Type 2 Diabetes: E11.9]. Please dispense supplies covered by patient's insurance. 100 each 3  lidocaine  (LIDODERM ) 5 % Place 1 patch onto the skin every 24 hours. Remove & Discard patch within 12 hours or as directed by MD (Patient not taking: No sig reported) 30 patch 0  ONETOUCH DELICA PLUS LANCET 33 gauge ONETOUCH VERIO test strips To check blood glucose up to 4x daily. 100 each 3  tadalafiL  (CIALIS ) 20 mg tablet Take 1 tablet (20 mg total) by mouth every other day as needed for erectile dysfunction. (Patient not taking: Reported on 02/21/2024) 10 tablet 3  [DISCONTINUED] chlorthalidone  (HYGROTEN) 50 mg tablet Take 1 tablet (50 mg total) by mouth daily. (Patient not taking: Reported on 05/28/2022) 90 tablet 3 Bee pollenHistory reviewed. No pertinent family history. The patient is stable and stated understanding of the risks and benefits of the procedure.  There appears to be no contra-indication for the procedure.EZ:Yzji and Neck are normal Pulmonary Exam: Lungs are clear bilaterally and normal excursionsCardiac: No murmurs, normal rate and rhythm. Impression:Patient is stable for the procedure and stated understanding of the procedure, risks and benefits.Clinical Reason for Exam: colon polyps surveillancePlan: ColonoscopyXavier Jabaree Mercado, MD, PhDProfessor of MedicineSection of Digestive Diseases

## 2024-02-23 NOTE — PACU Transfer of Care [100004]
 Post Anesthesia Transfer of Care NotePatient: Baron A SantiagoProcedure(s) Performed: Procedure(s) (LRB):COLONOSCOPY, FLEXIBLE; DX, W/WO SPECIMENS/COLON DECOMP (SEP PROC) (N/A)Last Vitals: I have reviewed the post-operative vital signs during the handoff as noted in the Epic chart.POSTOP HANDOFF :      Patient Location:  PACU     Level of Consciousness:  Awake and alert     VS stable since last recorded intra-op set? Yes       Oxygen source: maskPatient co-morbidities, intra-operative course, intake & output and antibiotics as per Anesthesia record were discussed with the RN.

## 2024-02-23 NOTE — Anesthesia Preprocedure Evaluation [24]
 This is a 63 y.o. male scheduled for COLONOSCOPY, FLEXIBLE; DX, W/WO SPECIMENS/COLON DECOMP (SEP PROC).Review of Systems/ Medical History Patient summary, EKG/Cardiac Studies , Labs, pre-procedure vitals, height, weight and NPO status reviewed.No previous anesthesia concernsAnesthesia Evaluation:   No history of anesthetic complications  Estimated body mass index.02/23/24 : 41.81 kg/m? Last patient weight recorded. 02/23/24 : 124.7 kg Last patient height recorded. 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), ESRD s/p renal transplant (2021), HTN, HLD, CAD, Incomplete LBBB, severe OSA on CPAP, GERD, Hyperparathyroidism, DM2 (Trulicity  and Jardiance ), Thoracic aortic aneurysm , chronic back painPast Surgical History:  Past Surgical History:No date: AV FISTULA PLACEMENT; Left    Comment:  left lower arm unsuccessful, then moved to upper arm              2011, then had problems in 10/2013 - infectionPAH 2021:endotracheal tube; Location: oral; Size(mm): 7.5; Insertion Attempts: 1; Cuffed?: Cuffed; Intubating Device: Glidescope; Blade Size: 4; Adjuncts: stylet properly used; Placement Verification: Direct vision of vocal cords, Sustained capnography, Auscultation; VC Grade: II; Oral/Nasal Integrity: same as baseline, atraumatic; Tube secured at(cm): 24; ETT Secured: at teeth; Comments: unable to maks ventilate even with two hands/two person and oral airwayCardiovascular: Patient has a history of: hypercholesterolemia and hypertension.   -Dysrhythmia(s): yes: ncomplete left bundle branch block-Vascular Disease:   aortic disease (Thoracic aortic aneurysm).   -Other Cardiovascular:   10/12/23: ZXH:Dpwld rhythm:Incomplete left bundle branch block:Minimal ST elevation, anterior leads:04/05/22: ECHOConclusion~ * Normal left ventricular size, systolic function and wall motion. Moderate concentric left ventricular hypertrophy.  LVEF calculated by biplane Simpson's was 69%.  Normal diastolic function and filling pressures.~ * Normal right ventricular cavity size, systolic function and wall motion.  Tricuspid regurgitation envelope is inadequate for estimation of right ventricular systolic pressure~ * Atria are normal in size.~ * No significant valvular abnormalities.~ * Dilated sinuses of Valsalva with a diameter of 4.2 cm and dilated ascending aorta with a diameter of 4.1 cm.~ * IVC diameter > 2.1 cm that collapses < 50% with a sniff suggests high RAP (10-20 mmHg, mean 15 mmHg~ * Compared with the prior study, dated 09/19/2020, there are changes noted.  Diastolic function and LV tissue doppler has normalized (may be undermeasured on the prior study). On the prior study IVC was dilated but collapsible.SABRA Respiratory:      -Obstructive sleep apnea:   yes (severe), CPAP use.  Gastrointestinal/Genitourinary: Gastrointestinal Disorders:  Patient has GERD.Hepatic Disorders:  Patient has liver disease and cirrhosis.Nutritional Disorders: Pt is obese per BMI definition-morbid obesity (BMI > 40).Renal Disorders:  Patient has renal insufficiency (s/p renal transplant (2021). Endocrine/Metabolic: -Diabetes mellitus:  Patient has diabetes mellitus (Trulicity  and Jardiance ) type 2.-Thyroid Disorders:  Patient has hyperparathyroidism.Additional Findings: 02/20/24 09:10Sodium: 143Potassium: 3.8Chloride: 101CO2: 27Anion Gap: 15BUN: 15Creatinine: 1.70 (H)Physical ExamCardiovascular:    normal exam  Rhythm: regularPulmonary:  normal exam  Patient's breath sounds clear to auscultationAirway:  Mallampati: IIITM distance: >3 FBNeck ROM: fullMouth Opening: >3cmDental:  unremarkable  Anesthesia PlanASA 3 The primary anesthesia plan is  general. Perioperative Code Status confirmed: It is my understanding that the patient is currently designated as 'Full Code' and will remain so throughout the perioperative period.Anesthesia informed consent obtained.  Consent obtained from: patientType of Anesthesia informed consent obtained:  E-consentUse of blood products: consented  Plan discussed with CRNA.Anesthesiologist's Pre Op NoteI personally evaluated and examined the patient prior to the intra-operative phase of care on the day of the procedure.SABRA

## 2024-02-23 NOTE — Anesthesia Procedure/Diagnosis Confirmation Note [[id number]]
 Operative Diagnosis:Pre-op:   Abnormal colonoscopy [R93.3]Kidney transplanted [Z94.0] Patient Coded Diagnosis   Pre-op diagnosis: Abnormal colonoscopy, Kidney transplanted  Post-op diagnosis: Internal hemorrhoids  Patient Diagnosis   Pre-op diagnosis: Abnormal colonoscopy [R93.3], Kidney transplanted [Z94.0]  Post-op diagnosis: poor prep    Post-op diagnosis:   * Internal hemorrhoids [K64.8]Operative Procedure(s) :Procedure(s) (LRB):COLONOSCOPY, FLEXIBLE; DX, W/WO SPECIMENS/COLON DECOMP (SEP PROC) (N/A)Post-op Procedure & Diagnosis ConfirmationPost-op Diagnosis: Post-op Diagnosis confirmed (no changes)Post-op Procedure: Post-op Procedure confirmed (no changes)Anesthesia ClarifiersGI/Endoscopy: Planned Screening Colonoscopy - Procedure Performed (see above)

## 2024-02-23 NOTE — Anesthesia Postprocedure Evaluation [25]
 Anesthesia Post-op NotePatient: Adam A SantiagoProcedure(s):  Procedure(s) (LRB):COLONOSCOPY, FLEXIBLE; DX, W/WO SPECIMENS/COLON DECOMP (SEP PROC) (N/A) Last Vitals:  I have reviewed the post-operative vital signs as noted in the Epic chart.POSTOP EVALUATION:      Patient Recovery Location:  PACU     Vital Signs Status:  Stable     Patient Participation:  Patient participated     Mental Status:  Awake, alert and oriented     Respiratory Status:  Acceptable     Airway Patency:  Patent     Cardiovascular/Hydration Status:  Stable     Pain Management:  Satisfactory to patient     Nausea/Vomiting Status:  Satisfactory to patientNo notable events documented.

## 2024-03-05 ENCOUNTER — Encounter: Admit: 2024-03-05 | Payer: PRIVATE HEALTH INSURANCE | Primary: Internal Medicine

## 2024-03-17 ENCOUNTER — Encounter: Admit: 2024-03-17 | Payer: PRIVATE HEALTH INSURANCE | Primary: Internal Medicine

## 2024-03-19 ENCOUNTER — Encounter: Admit: 2024-03-19 | Payer: PRIVATE HEALTH INSURANCE | Primary: Internal Medicine

## 2024-03-19 ENCOUNTER — Encounter: Admit: 2024-03-19 | Payer: PRIVATE HEALTH INSURANCE | Attending: Internal Medicine | Primary: Internal Medicine

## 2024-03-19 DIAGNOSIS — N186 End stage renal disease: Principal | ICD-10-CM

## 2024-03-19 MED ORDER — ATORVASTATIN 40 MG TABLET
40 | ORAL_TABLET | Freq: Every day | ORAL | 4 refills | 90.00000 days | Status: AC
Start: 2024-03-19 — End: ?
  Filled 2024-03-20: qty 90, 90d supply, fill #0

## 2024-03-19 NOTE — Progress Notes [1]
 Specialty Pharmacy Medication Dispense Coordination Note HARIM BI DOB 16-Sep-1960 was set up for medications to be dispensed by the Specialty Pharmacy. Associated Specialty Program(s): Rx Kidney Transplant Refill Coordination   Row Name 03/19/24 1554   Validate patient using 2 patient identifiers.  Medications Linked to Program mycophenolate  mofetil (CELLCEPT );tacrolimus  (ENVARSUS  XR)  Person authorizing delivery? patient  Patient reported number of doses remaining? 7  How many doses has the patient missed since their last refill? 0  Patient/caregiver confirms patient is taking medication appropriately (i.e. dose, route, frequency)? Y  Has the patient been experiencing any side effects? N  Has the patient started or stopped any medications? N  Has the patient developed any new allergies or medical conditions? N  Has the patient had any unplanned doctor appointments, urgent care, ED, or hospital admissions in the last 4 weeks? N  Are there any medications due (specialty or non-specialty) that are not being filled? N  Patient/caregiver authorized copay amount and payment method? Y  Confirmed delivery address and phone number? Y  Does the patient/caregiver have any questions for a pharmacist? LOISE Eleanor Schooner, CPHTPharmacy TechnicianJanuary 5, 20263:55 PMOutpatient Pharmacy Services at [facility] any questions, please call:  [phone]

## 2024-03-19 NOTE — Progress Notes [1]
 Specialty Pharmacy Medication ConsultationPatient Description: Adam Harper:  64 y.o., maleWeight:  Wt Readings from Last 3 Encounters: 02/23/24 124.7 kg 02/20/24 126.6 kg 01/03/24 127.6 kg Height: 	  Ht Readings from Last 1 Encounters: 02/23/24 5' 8 (1.727 m) Adam Harper was counseled nw:Emnhmjf: Rx Kidney TransplantDiagnosis: Kidney replaced by transplantMedication:  MYCOPHENOLATE  MOFETIL, TACROLIMUSType of Outreach: Clinical Assessment Follow-UpI have reviewed the information collected by pharmacy technicians for all Refill Coordination Outreach Assessments since the last pharmacist Clinical Assessment Follow-Up. Goals Addressed       This Visit's Progress   RxSp Therapeutic Goal   On track   Transplant: Maintain immunosuppression with target trough levels (applicable to agents that require monitoring such as tacrolimus , cyclosporine, sirolimus, everolimus)MTPs must be opened for patients not making appropriate progress towards their established therapeutic goals.Patient's progress towards goal: [place] goal 7-8:  Latest Reference Range & Units 02/20/24 09:10 Tacrolimus  Lvl See Comment ng/mL 8.6    RxSp Therapeutic Goal   On track   Transplant: Prevent rejectionMTPs must be opened for patients not making appropriate progress towards their established therapeutic goals.Patient's progress towards goal: No signs or symptoms of rejectionPer interpreter Omega ID: [id number] Patient demonstrates an understanding to the importance of taking his medications and attending scheduled MD appointments Per interpreter Morene ID: [id number] patient demonstrates and understanding to the importnace of taking his medications and attending scheduled MD appointments02/01- no s/s of rejection reported    RxSp Therapeutic Goal   On track   Transplant: Decrease hospitalizations related to specialty conditionMTPs must be opened for patients not making appropriate progress towards their established therapeutic goals.Patient's progress towards goal: Per interpreter Omega ID: [id number] patient confirmed no hospitalizations or urgent care visits 02/01-no hospitalizations reported   Medication Therapy RecommendationsNo medication therapy recommendations to displayLab AssessmentTherapy adjustments required based on labsCurrent Outpatient Medications Medication Instructions  amLODIPine  (NORVASC ) 5 mg, Oral, Daily  atorvastatin  (LIPITOR) 40 mg, Oral, Daily  blood sugar diagnostic test strips Use to check blood sugar daily [Type 2 Diabetes: E11.9]. Please dispense supplies covered by patient's insurance.  carvedilol  (COREG ) 25 mg, Oral, 2 Times Daily With Breakfast and Dinner  dulaglutide  (TRULICITY ) 3 mg/0.5 mL Pen Injector Inject 0.5 mLs (3 mg total) under the skin every 7 days.  fluticasone  propionate (FLONASE ) 50 mcg/actuation nasal spray 2 sprays, each nostril, Daily, Use for at least 4-6 weeks to see any improvement  Jardiance  10 mg, Oral, Daily  lancets Use to check blood sugar daily [Type 2 Diabetes: E11.9]. Please dispense supplies covered by patient's insurance.  lidocaine  (LIDODERM ) 5 % 1 patch, Transdermal, EVERY 24 HOURS, Remove & Discard patch within 12 hours or as directed by MD  losartan  (COZAAR ) 100 mg, Oral, Daily  magnesium  oxide (MAG-OX) 800 mg, Oral, 2 Times Daily Scheduled  mycophenolate  mofetil (CELLCEPT ) 750 mg, Oral, 2 Times Daily Scheduled  omeprazole  (PRILOSEC) 20 mg, Oral, Daily  ONETOUCH DELICA PLUS LANCET 33 gauge   ONETOUCH VERIO test strips To check blood glucose up to 4x daily.  polyethylene glycol-electrolytes (NULYTELY ) 420 gram solution Please take as directed by clinician: Procedure date: 02/23/2024  predniSONE  (DELTASONE ) 5 mg, Oral, Daily  sodium chloride  (OCEAN) 0.65 % nasal spray 2 sprays, each nostril, Nightly  tacrolimus  XR (ENVARSUS  XR) 8 mg, Oral, Daily  tadalafiL  (CIALIS ) 20 mg tablet Take 1 tablet (20 mg total) by mouth every other day as needed for erectile dysfunction. ** The allergy, problem, medication, and immunization lists were reviewed during this encounter.  Refer to ?Additional Documentation? for  comprehensive assessment/consultation.Redell Alice, PharmDSpecialty Clinical PharmacistJanuary 5, 20264:05 PMOutpatient Pharmacy Services at [facility] any questions regarding this assessment, please call:  [phone]

## 2024-03-20 ENCOUNTER — Encounter: Admit: 2024-03-20 | Payer: PRIVATE HEALTH INSURANCE | Primary: Internal Medicine

## 2024-03-20 ENCOUNTER — Encounter: Admit: 2024-03-20 | Payer: PRIVATE HEALTH INSURANCE | Attending: Adult Health | Primary: Internal Medicine

## 2024-03-20 ENCOUNTER — Ambulatory Visit: Admit: 2024-03-20 | Payer: PRIVATE HEALTH INSURANCE | Attending: Adult Health | Primary: Internal Medicine

## 2024-03-20 VITALS — BP 153/77 | HR 78

## 2024-03-20 DIAGNOSIS — E213 Hyperparathyroidism, unspecified: Secondary | ICD-10-CM

## 2024-03-20 DIAGNOSIS — I770 Arteriovenous fistula, acquired: Secondary | ICD-10-CM

## 2024-03-20 DIAGNOSIS — E119 Type 2 diabetes mellitus without complications: Secondary | ICD-10-CM

## 2024-03-20 DIAGNOSIS — T827XXA Infection and inflammatory reaction due to other cardiac and vascular devices, implants and grafts, initial encounter: Secondary | ICD-10-CM

## 2024-03-20 DIAGNOSIS — E7800 Pure hypercholesterolemia, unspecified: Secondary | ICD-10-CM

## 2024-03-20 DIAGNOSIS — I712 Thoracic aortic aneurysm (HC Code): Secondary | ICD-10-CM

## 2024-03-20 DIAGNOSIS — K7460 Unspecified cirrhosis of liver: Secondary | ICD-10-CM

## 2024-03-20 DIAGNOSIS — J101 Influenza due to other identified influenza virus with other respiratory manifestations: Secondary | ICD-10-CM

## 2024-03-20 DIAGNOSIS — K219 Gastro-esophageal reflux disease without esophagitis: Secondary | ICD-10-CM

## 2024-03-20 DIAGNOSIS — N186 End stage renal disease: Secondary | ICD-10-CM

## 2024-03-20 DIAGNOSIS — D649 Anemia, unspecified: Secondary | ICD-10-CM

## 2024-03-20 DIAGNOSIS — N2581 Secondary hyperparathyroidism of renal origin: Secondary | ICD-10-CM

## 2024-03-20 DIAGNOSIS — M47816 Spondylosis without myelopathy or radiculopathy, lumbar region: Secondary | ICD-10-CM

## 2024-03-20 DIAGNOSIS — G4733 Obstructive sleep apnea (adult) (pediatric): Secondary | ICD-10-CM

## 2024-03-20 DIAGNOSIS — I10 Essential (primary) hypertension: Principal | ICD-10-CM

## 2024-03-20 MED FILL — CARVEDILOL IMMEDIATE RELEASE 25 MG TABLET: 25 mg | ORAL | 30 days supply | Qty: 60 | Fill #3 | Status: CP

## 2024-03-20 MED FILL — OMEPRAZOLE 20 MG CAPSULE,DELAYED RELEASE: 20 mg | ORAL | 90 days supply | Qty: 90 | Fill #3 | Status: CP

## 2024-03-20 MED FILL — PREDNISONE 5 MG TABLET: 5 mg | ORAL | 30 days supply | Qty: 30 | Fill #8 | Status: CP

## 2024-03-20 MED FILL — MYCOPHENOLATE MOFETIL 250 MG CAPSULE: 250 mg | ORAL | 40 days supply | Qty: 240 | Fill #3 | Status: CP

## 2024-03-20 MED FILL — AMLODIPINE 5 MG TABLET: 5 mg | ORAL | 30 days supply | Qty: 30 | Fill #3 | Status: CP

## 2024-03-20 NOTE — Progress Notes [1]
 [facility] Phone: [phone], Fax: [phone], [address], [place], [place] [address], [place], [place] Name: Adam Harper of Birth: 01-30-1962Date of Visit: 1/6/2026Due to a language barrier, a qualified  interpreter was utilized for the entirety of this visit. Interpretation for history taking, subsequent discussion and physical exam. Interpreter ID: [id number] History of present illness:Adam Harper is a 64 y.o. 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 Patient presents today for evaluation of low back pain < legs without associated paresthesias. He reports intermittent, chronic low back pain for the past several years without inciting event or trauma. He reports radiation to B/L legs without associated paresthesias. Pain is described as lateral thighs, lateral calves, and top of the feet. Pain is described as low back < legs, left = right. Pain is increased with prolonged sitting. Pain is improved with walking and movement. He is RHD. No assistive devices for ambulation. He is on DSS. No tobacco or drug use. Interventions to date:Meds - Tylenol  PRN, lidocaine  patches PRN; minimal reliefPT - NoneCSI - NoneAlternative treatments - NoneDenies any reg flag symptoms at this time. No bladder or bowel dysfunction, no saddle anesthesia. No balance issues or falls. No fine motor dysfunction. Denies previous spine surgery or spinal injections. No other complaints.Past Medical History:He  has a past medical history of A-V fistula, Anemia, Anemia in ESRD (end-stage renal disease) (HC Code) (10/24/2014), Anemia in ESRD (end-stage renal disease) (HC Code)  (HC CODE) (10/24/2014), AV fistula infection (HC Code), Cirrhosis (HC Code)  (HC CODE), Diabetes mellitus (HC CODE), ESRD on hemodialysis (HC Code)  (HC CODE), GERD (gastroesophageal reflux disease), Hypercholesteremia, Hyperparathyroidism (HC Code), Hypertension, Infected prosthetic vascular graft, initial encounter (HC Code) (10/24/2014), Influenza A, Morbid obesity (HC Code) (HC CODE) (02/03/2018), Obstructive sleep apnea, Secondary hyperparathyroidism of renal origin (HC Code), and Thoracic aortic aneurysm (HC Code). Past Surgical History: He  has a past surgical history that includes AV fistula placement (Left); Colonoscopy; Parathyroidectomy; and Kidney transplant (2021).Medications:Current Medications[1] Allergies: He is allergic to bee pollen.Family History: family history is not on file.Social history: He  reports no history of drug use.He  reports no history of alcohol use.He  reports that he has quit smoking. His smoking use included cigarettes. He has a 6.3 pack-year smoking history. He has never used smokeless tobacco.Review of Systems: I have reviewed the review of systems with the patient, as noted by the clinical support staff.  This complete review of symptoms helps to identify any additional risk in surgical consideration.Physical Examination: BP (!) 153/77  - Pulse 78 General Appearance - Well developed, well nourishedMood/Affect - NormalApparent Distress - NoneSkin - Normal temperature, turgor, color, and moisture with no echhymosis, cyanosis, clubbing, or hyper/hypopigmented lesions Thoracolumbar Spine- No tenderness to palpation over spine or buttocks- No previous incisions- No restriction in thoracolumbar flexion or extension- No pain in back or legs with flexion or extension- No elevation of shoulders or pelvis ExtremitiesLower:- No pain with hip motion - Negative straight leg raise- B/L LE edema +1 Neurologic -Gait:       Non-antalgic gait       Stands unassisted with appropriate sagittal and coronal balance.       Does not require assistive device for ambulation Rectal Tone/Perineal Sensation Deferred d/t lack of bowel/bladder complaints    R L L2 (Hip flexors) 5 /5 5 /5 L3 (quad/Knee ext) 5 /5 5 /5 L4 (  Tibialis ant) 5 /5 5 /5  L5(EHL) 5 /5 5 /5 S1 (Gastroc) 5 /5 5 /5  Sensation:   R L L2 (Medial thigh) + + L3 (Medial knee) + + L4 (Medial ankle) + + L5 (1st web space) + + S1 (Lateral heel) + +  Reflexes:  R L L4 (Patellar) - - S1 (Achilles) - -                No clonusImaging:  XR Lumbar Spine AP and Lateral (10/23/2023)FINDINGS:There are 5 nonrib-bearing lumbar vertebral bodies. Vertebral body heights are maintained. No evidence of spondylolisthesis. No displaced fracture. Mild scoliotic curvature of the spine. Multilevel degenerative changes worst at L4-L5 and L5-S1. Vascular calcifications. IMPRESSION:No acute osseous abnormality. Reported and signed by: Migdalia Sor, MD Assessment/Plan:  Adam Harper is a 64 y.o. 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 Patient presents today for evaluation of low back pain < legs without associated paresthesias in the setting of mild dextroscoliosis of the lumbar spine as well as DDD L4-S1.  Neuromotor intact on exam without red flag symptoms.  Discussed with patient that given his prior kidney transplant in additional medical comorbidities, surgical intervention would be held as a last resort.  I have referred the patient to physical therapy to assist with symptoms.  I have referred the patient to pain Medicine for consideration of injections to assist with pain. He reports he is largely without pain today and is not interested in initiating any interventions at this time.  I advised the patient to call our office should his pain return and he would like to initiate these interventions. Patient to follow up in a p.r.n. fashion in the future.Referral to outpatient physical therapy. I have referred the patient for outpatient evaluation and treatment with our physical therapy team.  Specifically, they should work on range of motion, stretching, and strengthening with modalities as indicated.  I counseled the patient that they should perform the exercises they learn routinely at home as well but avoid exercises that cause them significant pain. Some soreness after exercise is expected though.Interventions: Physical Therapy, Referral Pain MedicineFollow up: PRNConservative: advised the use of non-pharmacological pain modalities including heat, ice, massage, position change, activity modification and mental health exercises including deep breathing, relaxation and distraction.Discussion was had with the patient regarding diagnosis and differential diagnosis. Risk and benefits of all treatment options were discussed with the patient. All questioned were answered and patient verbalized understanding of plan.  Electronically signed by: Kyelle Urbas DNP, APRN AGNP-BC  [1] Current Outpatient Medications Medication Sig Dispense Refill  amLODIPine  (NORVASC ) 5 mg tablet Take 1 tablet (5 mg total) by mouth daily. 30 tablet 11  atorvastatin  (LIPITOR) 40 mg tablet Take 1 tablet (40 mg total) by mouth daily. 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 Please dispense supplies covered by patient's insurance. 100 each 3  carvedilol  (COREG ) 25 mg Immediate Release tablet Take 1 tablet (25 mg total) by mouth 2 (two) times daily with breakfast and dinner. 60 tablet 11  dulaglutide  (TRULICITY ) 3 mg/0.5 mL Pen Injector Inject 0.5 mLs (3 mg total) under the skin every 7 days. 6 mL 3  empagliflozin  (JARDIANCE ) 10 mg tablet Take 1 tablet (10 mg total) by mouth daily. 90 tablet 1  fluticasone  propionate (FLONASE ) 50 mcg/actuation nasal spray Use 2 sprays  in each nostril daily. Use for at least 4-6 weeks to see any improvement 16 g 11  lancets Use to check blood sugar daily [Type 2 Diabetes: E11.9]. Please dispense supplies covered by patient's insurance. 100 each 3  lidocaine  (LIDODERM ) 5 % Place 1 patch onto the skin every 24 hours. Remove & Discard patch within 12 hours or as directed by MD (Patient not taking: No sig reported) 30 patch 0  losartan  (COZAAR ) 100 mg tablet Take 1 tablet (100 mg total) by mouth daily. 90 tablet 3  magnesium  oxide (MAG-OX) 400 mg (241.3 mg magnesium ) tablet Take 2 tablets (800 mg total) by mouth 2 (two) times daily. 360 tablet 3  mycophenolate  mofetil (CELLCEPT ) 250 mg capsule Take 3 capsules (750 mg total) by mouth 2 (two) times daily. 240 capsule 11  omeprazole  (PRILOSEC) 20 mg capsule Take 1 capsule (20 mg total) by mouth daily. 90 capsule 3  ONETOUCH DELICA PLUS LANCET 33 gauge     ONETOUCH VERIO test strips To check blood glucose up to 4x daily. 100 each 3  polyethylene glycol-electrolytes (NULYTELY ) 420 gram solution Please take as directed by clinician: Procedure date: 02/23/2024 4000 mL 0  predniSONE  (DELTASONE ) 5 mg tablet Take 1 tablet (5 mg total) by mouth daily. 30 tablet 11  sodium chloride  (OCEAN) 0.65 % nasal spray Use 2 sprays in each nostril nightly. 88 mL 12  tacrolimus  XR (ENVARSUS  XR) 4 mg 24 hr tablet Take 2 tablets (8 mg total) by mouth daily. 60 tablet 11  tadalafiL  (CIALIS ) 20 mg tablet Take 1 tablet (20 mg total) by mouth every other day as needed for erectile dysfunction. (Patient not taking: Reported on 02/21/2024) 10 tablet 3 No current facility-administered medications for this visit.

## 2024-03-21 ENCOUNTER — Encounter: Admit: 2024-03-21 | Payer: PRIVATE HEALTH INSURANCE | Primary: Internal Medicine

## 2024-03-21 DIAGNOSIS — I770 Arteriovenous fistula, acquired: Secondary | ICD-10-CM

## 2024-03-21 DIAGNOSIS — N186 End stage renal disease: Secondary | ICD-10-CM

## 2024-03-21 DIAGNOSIS — J101 Influenza due to other identified influenza virus with other respiratory manifestations: Secondary | ICD-10-CM

## 2024-03-21 DIAGNOSIS — G4733 Obstructive sleep apnea (adult) (pediatric): Secondary | ICD-10-CM

## 2024-03-21 DIAGNOSIS — I10 Essential (primary) hypertension: Principal | ICD-10-CM

## 2024-03-21 DIAGNOSIS — N2581 Secondary hyperparathyroidism of renal origin: Secondary | ICD-10-CM

## 2024-03-21 DIAGNOSIS — I712 Thoracic aortic aneurysm (HC Code): Secondary | ICD-10-CM

## 2024-03-21 DIAGNOSIS — E119 Type 2 diabetes mellitus without complications: Secondary | ICD-10-CM

## 2024-03-21 DIAGNOSIS — E7800 Pure hypercholesterolemia, unspecified: Secondary | ICD-10-CM

## 2024-03-21 DIAGNOSIS — K219 Gastro-esophageal reflux disease without esophagitis: Secondary | ICD-10-CM

## 2024-03-21 DIAGNOSIS — K7460 Unspecified cirrhosis of liver: Secondary | ICD-10-CM

## 2024-03-21 DIAGNOSIS — E213 Hyperparathyroidism, unspecified: Secondary | ICD-10-CM

## 2024-03-21 DIAGNOSIS — D649 Anemia, unspecified: Secondary | ICD-10-CM

## 2024-03-21 DIAGNOSIS — T827XXA Infection and inflammatory reaction due to other cardiac and vascular devices, implants and grafts, initial encounter: Secondary | ICD-10-CM

## 2024-03-22 ENCOUNTER — Inpatient Hospital Stay: Admit: 2024-03-22 | Discharge: 2024-03-22 | Payer: PRIVATE HEALTH INSURANCE | Primary: Internal Medicine

## 2024-03-22 DIAGNOSIS — M47816 Spondylosis without myelopathy or radiculopathy, lumbar region: Principal | ICD-10-CM

## 2024-03-22 NOTE — Rehab No Show [[id number]]
 [facility]Re[facility] - [facility] [address] [address] Number: [phone] Number: [phone] Therapy No Show Note1/8/2026Patient Name:  Adam Thivierge SantiagoMedical Record Number:  [id number] Date of Birth:  05-29-1962Therapist:  Cathlean Lauth, PTLuis A Harper was a no show for an initial evaluation  appointment today.Cathlean Lauth, PT, MPT, OCSPhysical TherapistBoard Certified Orthopedic Clinical [email or web address]

## 2024-03-29 ENCOUNTER — Ambulatory Visit: Admit: 2024-03-29 | Payer: PRIVATE HEALTH INSURANCE | Primary: Internal Medicine

## 2024-04-05 ENCOUNTER — Ambulatory Visit: Admit: 2024-04-05 | Payer: PRIVATE HEALTH INSURANCE | Primary: Internal Medicine

## 2024-04-10 ENCOUNTER — Encounter: Admit: 2024-04-10 | Payer: PRIVATE HEALTH INSURANCE | Primary: Internal Medicine

## 2024-04-10 ENCOUNTER — Inpatient Hospital Stay: Admit: 2024-04-10 | Discharge: 2024-04-10 | Payer: Medicare (Managed Care) | Primary: Internal Medicine

## 2024-04-10 ENCOUNTER — Ambulatory Visit: Admit: 2024-04-10 | Payer: PRIVATE HEALTH INSURANCE | Attending: Internal Medicine | Primary: Internal Medicine

## 2024-04-10 VITALS — BP 143/72 | HR 79 | Temp 97.30000°F | Resp 20 | Ht 68.0 in | Wt 280.2 lb

## 2024-04-10 DIAGNOSIS — N051 Unspecified nephritic syndrome with focal and segmental glomerular lesions: Secondary | ICD-10-CM

## 2024-04-10 DIAGNOSIS — Z940 Kidney transplant status: Secondary | ICD-10-CM

## 2024-04-10 DIAGNOSIS — Z79899 Other long term (current) drug therapy: Secondary | ICD-10-CM

## 2024-04-10 DIAGNOSIS — Z23 Encounter for immunization: Secondary | ICD-10-CM

## 2024-04-10 DIAGNOSIS — D84821 Immunodeficiency due to drugs: Secondary | ICD-10-CM

## 2024-04-10 DIAGNOSIS — E1165 Type 2 diabetes mellitus with hyperglycemia: Secondary | ICD-10-CM

## 2024-04-10 DIAGNOSIS — I10 Essential (primary) hypertension: Secondary | ICD-10-CM

## 2024-04-10 DIAGNOSIS — Z79621 Long term (current) use of calcineurin inhibitor: Secondary | ICD-10-CM

## 2024-04-10 DIAGNOSIS — Z5181 Encounter for therapeutic drug level monitoring: Secondary | ICD-10-CM

## 2024-04-10 DIAGNOSIS — Z796 Long-term use of immunosuppressant medication: Secondary | ICD-10-CM

## 2024-04-10 DIAGNOSIS — Z79624 Long term (current) use of inhibitors of nucleotide synthesis: Secondary | ICD-10-CM

## 2024-04-10 LAB — BASIC METABOLIC PANEL
BKR ANION GAP: 12 (ref 7–17)
BKR BLOOD UREA NITROGEN: 13 mg/dL (ref 8–23)
BKR BUN / CREAT RATIO: 8.7 (ref 8.0–23.0)
BKR CALCIUM: 8.7 mg/dL — ABNORMAL LOW (ref 8.8–10.2)
BKR CHLORIDE: 99 mmol/L (ref 98–107)
BKR CO2: 31 mmol/L — ABNORMAL HIGH (ref 20–30)
BKR CREATININE DELTA: -0.2
BKR CREATININE: 1.5 mg/dL — ABNORMAL HIGH (ref 0.67–1.17)
BKR EGFR, CREATININE (CKD-EPI 2021): 53 mL/min/{1.73_m2} — ABNORMAL LOW (ref >=60)
BKR GLUCOSE: 92 mg/dL (ref 70–140)
BKR POTASSIUM: 4 mmol/L (ref 3.3–5.5)
BKR SODIUM: 142 mmol/L (ref 136–145)

## 2024-04-10 LAB — CBC WITH AUTO DIFFERENTIAL
BKR WAM ABSOLUTE IMMATURE GRANULOCYTES.: 0.02 x 1000/ÂµL (ref 0.00–0.30)
BKR WAM ABSOLUTE LYMPHOCYTE COUNT.: 1 x 1000/ÂµL (ref 0.60–3.70)
BKR WAM ABSOLUTE NRBC: 0 x 1000/ÂµL (ref 0.00–1.00)
BKR WAM ANC (ABSOLUTE NEUTROPHIL COUNT): 4.67 x 1000/ÂµL (ref 2.00–7.60)
BKR WAM BASOPHIL ABSOLUTE COUNT.: 0.03 x 1000/ÂµL (ref 0.00–1.00)
BKR WAM BASOPHILS: 0.5 % (ref 0.0–1.4)
BKR WAM EOSINOPHIL ABSOLUTE COUNT.: 0.12 x 1000/ÂµL (ref 0.00–1.00)
BKR WAM EOSINOPHILS: 1.8 % (ref 0.0–5.0)
BKR WAM HEMATOCRIT: 49.1 % (ref 38.50–50.00)
BKR WAM HEMOGLOBIN: 15.4 g/dL (ref 13.2–17.1)
BKR WAM IMMATURE GRANULOCYTES: 0.3 % (ref 0.0–1.0)
BKR WAM LYMPHOCYTES: 15.4 % — ABNORMAL LOW (ref 17.0–50.0)
BKR WAM MCH: 30.6 pg (ref 27.0–33.0)
BKR WAM MCHC: 31.4 g/dL (ref 31.0–36.0)
BKR WAM MCV: 97.6 fL (ref 80.0–100.0)
BKR WAM MONOCYTE ABSOLUTE COUNT.: 0.67 x 1000/ÂµL (ref 0.00–1.00)
BKR WAM MONOCYTES: 10.3 % (ref 4.0–12.0)
BKR WAM MPV: 10 fL (ref 8.0–12.0)
BKR WAM NEUTROPHILS: 71.7 % (ref 39.0–72.0)
BKR WAM NUCLEATED RED BLOOD CELLS: 0 % (ref 0.0–1.0)
BKR WAM PLATELETS: 197 10*3/uL (ref 150–420)
BKR WAM RDW-CV: 13.1 % (ref 11.0–15.0)
BKR WAM RED BLOOD CELL COUNT.: 5.03 M/ÂµL (ref 4.00–6.00)
BKR WAM WHITE BLOOD CELL COUNT: 6.5 10*3/uL (ref 4.0–11.0)

## 2024-04-10 LAB — PHOSPHORUS     (BH GH L LMW YH): BKR PHOSPHORUS: 4.7 mg/dL — ABNORMAL HIGH (ref 2.5–4.5)

## 2024-04-10 LAB — ALBUMIN, URINE RANDOM WITH CREATININE
BKR ALBUMIN, URINE, RANDOM: 598 mg/L
BKR ALBUMIN/CREATININE RATIO, URINE, RANDOM: 643 mg/g{creat} — ABNORMAL HIGH (ref <30)
BKR CREATININE, URINE, RANDOM: 93 mg/dL

## 2024-04-10 LAB — PROTEIN, TOTAL, URINE RANDOM WITH CREATININE    (BH GH LMW YH)
BKR CREATININE, URINE, RANDOM: 93 mg/dL
BKR PROTEIN URINE RANDOM: 0.89 g/L
BKR PROTEIN/CREATININE RATIO, URINE, RANDOM: 1 mg/mg{creat} — ABNORMAL HIGH (ref <0.1)

## 2024-04-10 LAB — TACROLIMUS LEVEL     (BH GH L LMW YH): BKR TACROLIMUS BLOOD: 6 ng/mL

## 2024-04-10 LAB — MAGNESIUM: BKR MAGNESIUM: 1.8 mg/dL (ref 1.6–2.4)

## 2024-04-10 MED ORDER — FLU VACCINE TS 2025-26(6MOS UP)(PF) 45 MCG(15MCG X3)/0.5 ML IM SYRINGE
45 | INTRAMUSCULAR | Status: CP
Start: 2024-04-10 — End: ?
  Administered 2024-04-10: 13:00:00 45 mL via INTRAMUSCULAR

## 2024-04-10 NOTE — Progress Notes [1]
 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 Bactrim  switched to AtovaquoneIntermittent pre-syncopal events, holter monitoring.Stent removal 9/15/20213/22: Biopsy: ACUTE CELLULAR REJECTION, BANFF TYPE IA 10/22: Biopsy: ACUTE TUBULAR INJURY, FOCAL9/30/2024: NO EVIDENCE OF ACUTE REJECTION                FOCAL AND SEGMENTAL GLOMERULOSCLEROSIS                ACUTE TUBULAR INJURY, DIFFUSE History of Present Illness:Adam Harper comes to the transplant clinic today for a routine clinic visit. Overall patient is doing very well and denies any specific complains. He is doing well.  Denies any complaints.PFSH reviewed and confirmed 04/10/2024 Review of SystemsIn addition, following review of systems was conducted : Patient denies any fevers or chills, no nausea or vomiting. No chest pain/ chest tightness, no cough/sputum or shortness of breath. No headaches, neck stiffness, no vision changes, no ear or nose discharge. No oral lesions. No constipation or diarrhea. No urinary complains like dysuria, hematuria, cloudy urine, reduced output. No skin rashes, no joint effusions. No significant weight gain or weight loss.Physical Exam  Vitals: BP (!) 143/72 (Site: l a, Position: Sitting, Cuff Size: Large)  - Pulse 79  - Temp 97.3 ?F (36.3 ?C) (Temporal)  - Resp 20  - Ht 5' 8 (1.727 m)  - Wt 127.1 kg  - SpO2 96%  - BMI 42.60 kg/m? Wt Readings from Last 4 Encounters: 04/10/24 127.1 kg 02/23/24 124.7 kg 02/20/24 126.6 kg 01/03/24 127.6 kg GENERAL : Appears wellRESPIRATORY : NVBS heard bilaterallyCARDIOVASCULAR : S1 S2 + ABDOMEN : Soft, non-tenderEXTREMITIES : No edemaLabs:Lab Results Component Value Date  CREATININE 1.50 (H) 04/10/2024  BUN 13 04/10/2024  NA 142 04/10/2024  K 4.0 04/10/2024  CL 99 04/10/2024  CO2 31 (H) 04/10/2024  Lab Results Component Value Date  WBC 6.5 04/10/2024  HGB 15.4 04/10/2024  HCT 49.10 04/10/2024  MCV 97.6 04/10/2024  PLT 197 04/10/2024 Lab Results Component Value Date  TACROLIMUS  8.6 02/20/2024 Most recent BK viral testing -- BK Virus Quantitative PCR (no units) Date Value 05/17/2023 Not Detected 05/27/2022 Not Detected Most recent CMV viral testing -- Cytomegalovirus Quantitative PCR (no units) Date Value 01/19/2021 Not Detected Immunosuppression     Start End  mycophenolate  mofetil (CELLCEPT ) 250 mg capsule 11/21/2023 --  Sig - Route: Take 3 capsules (750 mg total) by mouth 2 (two) times daily. - Oral  Notes to Pharmacy: Kidney Transplant Z94.0.  Prior authorization: Closed - Prior Authorization not required for patient/medication  predniSONE  (DELTASONE ) 5 mg tablet 06/17/2023 --  Sig - Route: Take 1 tablet (5 mg total) by mouth daily. - Oral  Notes to Pharmacy: Kidney Transplant Z94.0  tacrolimus  XR (ENVARSUS  XR) 4 mg 24 hr tablet 02/21/2024 02/20/2025  Sig - Route: Take 2 tablets (8 mg total) by mouth daily. - Oral  Cosign for Ordering: Accepted by Norva Knee, PA on 02/21/2024  4:55 PM  No prior authorization was found for this prescription.  Found prior authorization for another prescription for the same medication: Approved  Antihypertensives     Start End  amLODIPine  (NORVASC ) 5 mg tablet 12/19/2023 --  Sig - Route: Take 1 tablet (  5 mg total) by mouth daily. - Oral  carvedilol  (COREG ) 25 mg Immediate Release tablet 11/17/2023 11/16/2024  Sig - Route: Take 1 tablet (25 mg total) by mouth 2 (two) times daily with breakfast and dinner. - Oral  losartan  (COZAAR ) 100 mg tablet 11/23/2023 --  Sig - Route: Take 1 tablet (100 mg total) by mouth daily. - Oral  Transplant Prophylaxis   None  Viral monitoring : OMARII SCALZO is Lab Results Component Value Date  CYTOMEGALOVI Positive (A) 10/02/2019 Assessment / PlanAllograft function: DDKT 11/05/19, he is 4.5 years out from his kidney transplantation.  He has very good kidney function with a baseline creatinine of 1.5.  No major electrolyte abnormalities although his calcium  is slightly on the lower end at 8.7 and phosphorus slightly on the higher end at 4.7.  Has a normal CBC.	- proteinuria:  Upc at 1.0.  He is on max dose of losartan  as well as Jardiance .Immunosuppression: envarsus  8 mg/day, MMF 750mg  bid, prednisone  5mg /day	- unfortunately he took his Envarsus  prior to the blood draw.Hypertension:	- currently on amlodipine  5 mg/day, coreg  25mg  bid and losartan  100 mg per day	- clinic blood pressure in 140s systolic range.  Encouraged him to measure his blood pressures at home as well.RTC in six months with labs.  No changes made to his therapy today.Crystal Sor, MD, FASN, FASTAssociate Professor of MedicineTransplant Nephrology [facility] of MedicineTel: [phone]/27/2026

## 2024-04-10 NOTE — Progress Notes [1]
 Will repeat tac level on Friday 1/30 with well timed tac level

## 2024-04-10 NOTE — Progress Notes [1]
 Nursing Assessment Follow Up Visit: Kidney Transplant Program Subjective: Adam Harper (64 y.o. 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 Denies fever, chills, chest pain, shortness of breath, or abdominal pain. No hematuria or dysuria noted by Adam Harper. Urinating without symptoms of obstruction or infection. Reports compliance with current medications.Current Transplant Immunosuppression:Immunosuppression     Start End  mycophenolate  mofetil (CELLCEPT ) 250 mg capsule 11/21/2023 --  Sig - Route: Take 3 capsules (750 mg total) by mouth 2 (two) times daily. - Oral  Notes to Pharmacy: Kidney Transplant Z94.0.  Prior authorization: Closed - Prior Authorization not required for patient/medication  predniSONE  (DELTASONE ) 5 mg tablet 06/17/2023 --  Sig - Route: Take 1 tablet (5 mg total) by mouth daily. - Oral  Notes to Pharmacy: Kidney Transplant Z94.0  tacrolimus  XR (ENVARSUS  XR) 4 mg 24 hr tablet 02/21/2024 02/20/2025  Sig - Route: Take 2 tablets (8 mg total) by mouth daily. - Oral  Cosign for Ordering: Accepted by Norva Knee, PA on 02/21/2024  4:55 PM  No prior authorization was found for this prescription.  Found prior authorization for another prescription for the same medication: Approved  Transplant Prophylaxis   None  Allergies[1]Objective: Vital Signs: BP (!) 143/72 (Site: l a, Position: Sitting, Cuff Size: Large)  - Pulse 79  - Temp 97.3 ?F (36.3 ?C) (Temporal)  - Resp 20  - Ht 5' 8 (1.727 m)  - Wt 127.1 kg  - SpO2 96%  - BMI 42.60 kg/m?  Most recent creatinine: Lab Results Component Value Date  CREATININE 1.50 (H) 04/10/2024 Lab Results Component Value Date  TACROLIMUS  8.6 02/20/2024 Pt forgot to take Env 8 mg yesterday morning, so he took it around 5 pm when he remembered. Pt was instructed to repeat the Env level in the next few days. Transplant graft is functioning. Assessment: Adam Harper is a 64 y.o. male here today for follow-up of kidney transplant. Continues with immunosuppression regimen and is at risk for infection.The patient has not had any hospitalizations since their last visit. Physical Capacity: No LimitationsKarnofsky Status: 80% - Normal Activity with Effort: Some Symptoms of DiseaseEmployment Status: No; DisabilityThe patient has not developed diabetes since their last visit. The patient has not had dialysis since their last follow-up. The patient has not had organ transplant rejection since their last follow-up. The patient has not had any evidence of malignancy since their last follow-up. Plan: Review labs with clinic attending. Adam Harper will be notified of any immunosuppression changes/treatments based on lab results.Adam Harper was provided education on maintaining medication compliance and post-transplant care. Patient was advised to contact the transplant center with any concerns. The patient verbalized understanding and intent to comply with the plan of care. Electronically Signed by Daphne Rote, RN, April 10, 2024 [1] AllergiesAllergen Reactions  Bee Pollen Other (See Comments)

## 2024-04-11 LAB — HEMOGLOBIN A1C
BKR ESTIMATED AVERAGE GLUCOSE: 126 mg/dL
BKR HEMOGLOBIN A1C: 6 % — ABNORMAL HIGH (ref <5.7)

## 2024-04-13 ENCOUNTER — Telehealth: Admit: 2024-04-13 | Payer: PRIVATE HEALTH INSURANCE | Primary: Internal Medicine

## 2024-04-13 NOTE — Telephone Encounter [36]
 Called patient with Spanish interpreter to remind him to do a well timed tac level (he had taken medication prior to 1/27 blood draw). No answer, left VM

## 2024-04-15 ENCOUNTER — Encounter: Admit: 2024-04-15 | Payer: PRIVATE HEALTH INSURANCE | Primary: Internal Medicine

## 2024-04-17 ENCOUNTER — Encounter: Admit: 2024-04-17 | Payer: PRIVATE HEALTH INSURANCE | Primary: Internal Medicine

## 2024-04-17 DIAGNOSIS — Z940 Kidney transplant status: Secondary | ICD-10-CM

## 2024-04-17 DIAGNOSIS — Z796 Long-term use of immunosuppressant medication: Principal | ICD-10-CM

## 2024-04-17 DIAGNOSIS — R12 Heartburn: Secondary | ICD-10-CM

## 2024-04-17 MED ORDER — OMEPRAZOLE 20 MG CAPSULE,DELAYED RELEASE
20 | ORAL_CAPSULE | Freq: Every day | ORAL | 4 refills | 90.00000 days | Status: AC
Start: 2024-04-17 — End: ?

## 2024-04-18 ENCOUNTER — Telehealth: Admit: 2024-04-18 | Payer: PRIVATE HEALTH INSURANCE | Primary: Internal Medicine

## 2024-04-18 NOTE — Telephone Encounter [36]
 Spanish interpreter ID: [id number] Called pt regarding repeat labs. He reports he will go tomorrow. Will follow up with results.

## 2024-04-19 ENCOUNTER — Inpatient Hospital Stay: Admit: 2024-04-19 | Payer: Medicare (Managed Care) | Primary: Internal Medicine

## 2024-04-19 DIAGNOSIS — Z940 Kidney transplant status: Principal | ICD-10-CM

## 2024-04-19 DIAGNOSIS — Z79899 Other long term (current) drug therapy: Secondary | ICD-10-CM

## 2024-04-19 DIAGNOSIS — Z796 Long-term use of immunosuppressant medication: Secondary | ICD-10-CM

## 2024-04-20 LAB — TACROLIMUS LEVEL     (BH GH L LMW YH): BKR TACROLIMUS BLOOD: 8.4 ng/mL

## 2024-05-08 ENCOUNTER — Encounter: Admit: 2024-05-08 | Payer: PRIVATE HEALTH INSURANCE | Attending: Ophthalmology | Primary: Internal Medicine

## 2024-08-20 ENCOUNTER — Ambulatory Visit
Admit: 2024-08-20 | Payer: PRIVATE HEALTH INSURANCE | Attending: Student in an Organized Health Care Education/Training Program | Primary: Internal Medicine

## 2024-09-18 ENCOUNTER — Ambulatory Visit: Admit: 2024-09-18 | Payer: PRIVATE HEALTH INSURANCE | Primary: Internal Medicine

## 2024-10-10 ENCOUNTER — Ambulatory Visit: Admit: 2024-10-10 | Payer: PRIVATE HEALTH INSURANCE | Attending: Cardiovascular Disease | Primary: Internal Medicine

## 2024-10-17 ENCOUNTER — Ambulatory Visit: Admit: 2024-10-17 | Payer: PRIVATE HEALTH INSURANCE | Attending: Cardiovascular Disease | Primary: Internal Medicine
